# Patient Record
Sex: Male | Born: 1945 | Race: White | Hispanic: No | Smoking: Former smoker
Health system: Southern US, Community
[De-identification: ages and names within clinical notes are randomized; demographics above are authoritative.]

## PROBLEM LIST (undated history)

## (undated) DIAGNOSIS — I10 Essential (primary) hypertension: Secondary | ICD-10-CM

## (undated) DIAGNOSIS — C801 Malignant (primary) neoplasm, unspecified: Secondary | ICD-10-CM

## (undated) DIAGNOSIS — I219 Acute myocardial infarction, unspecified: Secondary | ICD-10-CM

## (undated) DIAGNOSIS — I2510 Atherosclerotic heart disease of native coronary artery without angina pectoris: Secondary | ICD-10-CM

## (undated) DIAGNOSIS — E119 Type 2 diabetes mellitus without complications: Secondary | ICD-10-CM

## (undated) DIAGNOSIS — Z87442 Personal history of urinary calculi: Secondary | ICD-10-CM

## (undated) DIAGNOSIS — M109 Gout, unspecified: Secondary | ICD-10-CM

## (undated) DIAGNOSIS — E785 Hyperlipidemia, unspecified: Secondary | ICD-10-CM

## (undated) HISTORY — DX: Hyperlipidemia, unspecified: E78.5

## (undated) HISTORY — PX: BACK SURGERY: SHX140

## (undated) HISTORY — DX: Essential (primary) hypertension: I10

## (undated) HISTORY — DX: Type 2 diabetes mellitus without complications: E11.9

## (undated) HISTORY — DX: Gout, unspecified: M10.9

## (undated) HISTORY — DX: Atherosclerotic heart disease of native coronary artery without angina pectoris: I25.10

## (undated) HISTORY — PX: OTHER SURGICAL HISTORY: SHX169

## (undated) NOTE — *Deleted (*Deleted)
PMR Admission Coordinator Pre-Admission Assessment   Patient: [name] [name]. is an 85 y.o., male MRN: [id number] DOB: 1946-11-09 Height: 5\' 9"  (175.3 cm) Weight: 99.2 kg   Insurance Information HMO:     PPO:      PCP:      IPA:      80/20:      OTHER:  PRIMARY: A[employer] Medicare      Policy#: [id number]      Subscriber: pt CM Name: Herbert Seta      Phone#: ***     Fax#: [phone] Pre-Cert#: ***      Employer:  Benefits:  Phone #: [phone]     Name:  Dolores Hoose. Date: 11/12/19     Deduct: $0      Out of Pocket Max: $5000  [phone] met)      Life Max: n/a CIR: $295/day for days 1-6      SNF: 20 full days Outpatient:      Co-Pay: $35/visit Home Health: 100%      Co-Pay:  DME: 80%     Co-Pay: 20% Providers:  SECONDARY:       Policy#:      Phone#:    Artist:       Phone#:    The Engineer, materials Information Summary" for patients in Inpatient Rehabilitation Facilities with attached "Privacy Act Statement-Health Care Records" was provided and verbally reviewed with: Patient and Family   Emergency Contact Information         Contact Information     Name Relation Home Work Mobile    [place] Spouse [phone]        [facility] Daughter     [phone]    Sandria Bales     [phone]         Current Medical History  Patient Admitting Diagnosis: cervical myelopathy   History of Present Illness: Pt is a 89 y/o male with PMH of CAD, DM, gout, HTN, and MI who presents to [facility] at the request of his neurosurgeon on 11/5 with c/o worsening weakness and falls following a ? CVA in August 2021 (imaging did not confirm).  ED exam showed numbness to L face, LUE, LLE and weakness in LUE/LLE compared to R.  Per Dr. Maurice Small, pt had been having syncopal episodes in the past, and felt weakness was worsening over the last few weeks.  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  Requested MRI brain/Cspine, CTA with head turn left.  Also consulted Neurology.  MRI brain was negative.  MRI cspine showed multilevel spinal and foraminal stenosis due to spurring as well as a large osteophyte on the L at C5-6 with several spinal encroachment at the same level.  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  Pt underwent C3-C6 laminectomy and bilateral forminotomies per Dr. Maurice Small on 11/7.  Post op course pain management, management of gout flare-up, and DM management.  Therapy evaluations were completed and pt was recommended for CIR.    Patient's medical record from [facility] has been reviewed by the rehabilitation admission coordinator and physician.   Past Medical History      Past Medical History:  Diagnosis Date  . CAD (coronary artery disease)    . Cancer (HCC)    . Diabetes (HCC)    .  Dyslipidemia    . Gout    . History of kidney stones    . HTN (hypertension)    . Myocardial infarction [facility])        Family History   family history includes Colon cancer (age of onset: 68) in his brother; Heart attack (age of onset: 56) in his father.   Prior Rehab/Hospitalizations Has the patient had prior rehab or hospitalizations prior to admission? Yes   Has the patient had major surgery during 100 days prior to admission? Yes              Current Medications   Current Facility-Administered Medications:  .  0.9 %  sodium chloride infusion, , Intravenous, Continuous, Ostergard, Clovis Pu, MD, Last Rate: 75 mL/hr at 09/17/20 0001, New Bag at 09/17/20 0001 .  acetaminophen (TYLENOL) tablet 650 mg, 650 mg, Oral, Q4H PRN, 650 mg at 09/17/20 1408 **OR** acetaminophen (TYLENOL) suppository 650 mg, 650 mg, Rectal, Q4H PRN, Jadene Pierini, MD .  atorvastatin (LIPITOR) tablet 20 mg, 20 mg, Oral, QHS, Ostergard, Clovis Pu, MD, 20 mg at 09/19/20 2203 .  colchicine  tablet 0.6 mg, 0.6 mg, Oral, BID PRN, Lisbeth Renshaw, MD, 0.6 mg at 09/19/20 2203 .  colchicine tablet 0.6 mg, 0.6 mg, Oral, BID, Lisbeth Renshaw, MD, 0.6 mg at 09/20/20 1226 .  cyclobenzaprine (FLEXERIL) tablet 10 mg, 10 mg, Oral, TID PRN, Jadene Pierini, MD, 10 mg at 09/20/20 1226 .  docusate sodium (COLACE) capsule 100 mg, 100 mg, Oral, BID, Jadene Pierini, MD, 100 mg at 09/20/20 0856 .  gabapentin (NEURONTIN) capsule 300 mg, 300 mg, Oral, TID, Jadene Pierini, MD, 300 mg at 09/20/20 0856 .  HYDROmorphone (DILAUDID) injection 1 mg, 1 mg, Intravenous, Q3H PRN, Jadene Pierini, MD, 1 mg at 09/18/20 0730 .  insulin aspart (novoLOG) injection 0-15 Units, 0-15 Units, Subcutaneous, TID WC, Ostergard, Clovis Pu, MD, 5 Units at 09/20/20 1226 .  insulin aspart (novoLOG) injection 0-5 Units, 0-5 Units, Subcutaneous, QHS, Ostergard, Thomas A, MD .  menthol-cetylpyridinium (CEPACOL) lozenge 3 mg, 1 lozenge, Oral, PRN **OR** phenol (CHLORASEPTIC) mouth spray 1 spray, 1 spray, Mouth/Throat, PRN, Ostergard, Thomas A, MD .  metoprolol succinate (TOPROL-XL) 24 hr tablet 50 mg, 50 mg, Oral, Daily, Jadene Pierini, MD, 50 mg at 09/20/20 0855 .  ondansetron (ZOFRAN) tablet 4 mg, 4 mg, Oral, Q6H PRN **OR** ondansetron (ZOFRAN) injection 4 mg, 4 mg, Intravenous, Q6H PRN, Ostergard, Thomas A, MD .  oxyCODONE (Oxy IR/ROXICODONE) immediate release tablet 10 mg, 10 mg, Oral, Q4H PRN, Jadene Pierini, MD, 10 mg at 09/20/20 1226 .  oxyCODONE (Oxy IR/ROXICODONE) immediate release tablet 5 mg, 5 mg, Oral, Q4H PRN, Ostergard, Thomas A, MD .  pantoprazole (PROTONIX) EC tablet 40 mg, 40 mg, Oral, Daily, Jadene Pierini, MD, 40 mg at 09/20/20 0856 .  polyethylene glycol (MIRALAX / GLYCOLAX) packet 17 g, 17 g, Oral, Daily PRN, Jadene Pierini, MD .  tamsulosin (FLOMAX) capsule 0.4 mg, 0.4 mg, Oral, QHS, Ostergard, Thomas A, MD, 0.4 mg at 09/19/20 2203   Patients Current Diet:     Diet  Order                      Diet Carb Modified Fluid consistency: Thin; Room service appropriate? Yes  Diet effective now                      Precautions / Restrictions Precautions  Precautions: Fall, Cervical Precaution Comments: very painful neck with all movement; has vertebral artery stenosis Restrictions Weight Bearing Restrictions: No    Has the patient had 2 or more falls or a fall with injury in the past year? Yes   Prior Activity Level Limited Community (1-2x/wk): had been going out less since Covid started in 2020.  Not mobilizing as much since then.  Using DME for mobility due to pain, using cane then RW   Prior Functional Level Self Care: Did the patient need help bathing, dressing, using the toilet or eating? Independent   Indoor Mobility: Did the patient need assistance with walking from room to room (with or without device)? Independent   Stairs: Did the patient need assistance with internal or external stairs (with or without device)? Independent   Functional Cognition: Did the patient need help planning regular tasks such as shopping or remembering to take medications? Independent   Home Assistive Devices / Equipment Home Assistive Devices/Equipment: Cane (specify quad or straight) Home Equipment: Cane - single point, Walker - 2 wheels, Hand held shower head   Prior Device Use: Indicate devices/aids used by the patient prior to current illness, exacerbation or injury? Walker and cane   Current Functional Level Cognition   Overall Cognitive Status: Within Functional Limits for tasks assessed Orientation Level: Oriented X4    Extremity Assessment (includes Sensation/Coordination)   Upper Extremity Assessment: Defer to OT evaluation LUE Deficits / Details: ROM limited to 90 degrees in L shoulder due to pain an old rotator cuff injury.  Strength:  shoulder 2/5, biceps 4/5, triceps 3+/5, wrist extension 3+/5, grip 3/5, finger extension 2+/5. LUE: Unable to  fully assess due to pain LUE Sensation: decreased light touch LUE Coordination: decreased fine motor, decreased gross motor  Lower Extremity Assessment: Overall WFL for tasks assessed (pt denies difference in sensation bilaterall)     ADLs   Overall ADL's : Needs assistance/impaired Eating/Feeding: Set up, Sitting, Bed level Eating/Feeding Details (indicate cue type and reason): too much pain in neck to sit up too long without support on the neck.   Grooming: Wash/dry hands, Wash/dry face, Oral care, Minimal assistance, Sitting Grooming Details (indicate cue type and reason): occasional min assist due to LUE coordination deficits. Upper Body Bathing: Moderate assistance, Sitting Lower Body Bathing: Maximal assistance, Sit to/from stand, Cueing for compensatory techniques Lower Body Bathing Details (indicate cue type and reason): Pt unable to tolerate full bathing adl at this time due to pain but can do small sections with rest breaks. Upper Body Dressing : Moderate assistance, Sitting, Cueing for compensatory techniques Lower Body Dressing: Moderate assistance, Sit to/from stand, Cueing for compensatory techniques Toilet Transfer: Minimal assistance, +2 for safety/equipment, Stand-pivot, BSC Toilet Transfer Details (indicate cue type and reason): pt can pivot to [facility].  Declined walking to bathroom due to pain.  Pt with L lean when up. Toileting- Clothing Manipulation and Hygiene: Minimal assistance, Sit to/from stand, Cueing for compensatory techniques Functional mobility during ADLs: Minimal assistance, Rolling walker, Cueing for safety General ADL Comments: Pt limited with all adls due to pain and L side weakness.  Pt leans to left and can correct with cues but often times does not correct on own.      Mobility   Overal bed mobility: Needs Assistance Bed Mobility: Supine to Sit, Sit to Supine Supine to sit: Min assist, HOB elevated Sit to supine: Min assist General bed mobility comments:  In chair     Transfers   Overall transfer  level: Needs assistance Equipment used: Rolling walker (2 wheeled) Transfers: Sit to/from Stand Sit to Stand: Min assist, +2 physical assistance General transfer comment: Performed sit to stand x 3; cues for safe hand placement; increased time     Ambulation / Gait / Stairs / Wheelchair Mobility   Ambulation/Gait Ambulation/Gait assistance: Min assist, +2 physical assistance Gait Distance (Feet): 3 Feet Assistive device: Rolling walker (2 wheeled) Gait Pattern/deviations: Step-to pattern, Decreased stride length General Gait Details: Pt declined today due to gout pain in L foot and unable to compensate with UE cue to L shoulder pain.  Reports he has started his gout medicine and feels pain will be improved to morrow for ambulation.  States "I always get a gout flair after surgery." Gait velocity: decreased Gait velocity interpretation: <1.31 ft/sec, indicative of household ambulator     Posture / Balance Dynamic Sitting Balance Sitting balance - Comments: Pt with L lean but could correct with cues. Balance Overall balance assessment: Needs assistance, History of Falls Sitting-balance support: Feet supported, No upper extremity supported Sitting balance-Leahy Scale: Fair Sitting balance - Comments: Pt with L lean but could correct with cues. Postural control: Left lateral lean Standing balance support: During functional activity, Bilateral upper extremity supported Standing balance-Leahy Scale: Poor Standing balance comment: Used RW and min guard of 2 for static stance.  Had pt weight shift side to side and when coming to the left pt occaionally required mod A.     Special needs/care consideration Skin surgical incision to posterior neck and Diabetic management yes    Previous Home Environment (from acute therapy documentation) Living Arrangements: Spouse/significant other Available Help at Discharge: Family, Available 24 hours/day Type of  Home: House Home Layout: One level Home Access: Level entry Bathroom Shower/Tub: Health visitor: Standard Home Care Services: No Additional Comments: does want to use w/c   Discharge Living Setting Plans for Discharge Living Setting: Patient's home Type of Home at Discharge: House Discharge Home Layout: One level Discharge Home Access: Level entry Discharge Bathroom Shower/Tub: Walk-in shower Discharge Bathroom Toilet: Standard Discharge Bathroom Accessibility: Yes How Accessible: Accessible via walker Does the patient have any problems obtaining your medications?: No   Social/Family/Support Systems Patient Roles: Spouse Contact Information: Aodhan Scheidt [phone] Anticipated Caregiver: Iantha Fallen (son), Delaney Meigs (dtr) Anticipated Caregiver's Contact Information: Iantha Fallen [phone]; Delaney Meigs [phone] Ability/Limitations of Caregiver: spouse can only provide supervision, Iantha Fallen and Delaney Meigs can provide up to min assist Caregiver Availability: 24/7 Discharge Plan Discussed with Primary Caregiver: Yes Is Caregiver In Agreement with Plan?: Yes Does Caregiver/Family have Issues with Lodging/Transportation while Pt is in Rehab?: No   Goals Patient/Family Goal for Rehab: PT/OT supervisio to mod I, no SLP Expected length of stay: 10-12 days Additional Information: gout flare on 11/8 Pt/Family Agrees to Admission and willing to participate: Yes Program Orientation Provided & Reviewed with Pt/Caregiver Including Roles  & Responsibilities: Yes  Barriers to Discharge: Insurance for SNF coverage   Decrease burden of Care through IP rehab admission: n/a   Possible need for SNF placement upon discharge: Not anticipated   Patient Condition: I have reviewed medical records from [facility], spoken with CM, and patient and son. I met with patient at the bedside for inpatient rehabilitation assessment.  Patient will benefit from ongoing PT and OT, can actively participate  in 3 hours of therapy a day 5 days of the week, and can make measurable gains during the admission.  Patient will also benefit from the coordinated team approach during  an Inpatient Acute Rehabilitation admission.  The patient will receive intensive therapy as well as Rehabilitation physician, nursing, social worker, and care management interventions.  Due to safety, skin/wound care, disease management, medication administration, pain management and patient education the patient requires 24 hour a day rehabilitation nursing.  The patient is currently mod assist with mobility and basic ADLs.  Discharge setting and therapy post discharge at home with home health is anticipated.  Patient has agreed to participate in the Acute Inpatient Rehabilitation Program and will admit pending insurance authorization ***.   Preadmission Screen Completed By:  Stephania Fragmin, PT, DPT 09/20/2020 1:23 PM

---

## 2004-10-08 ENCOUNTER — Ambulatory Visit: Payer: Self-pay | Admitting: Family Medicine

## 2005-04-16 ENCOUNTER — Ambulatory Visit: Payer: Self-pay | Admitting: Family Medicine

## 2005-10-04 ENCOUNTER — Ambulatory Visit: Payer: Self-pay | Admitting: Family Medicine

## 2005-12-16 ENCOUNTER — Ambulatory Visit: Payer: Self-pay | Admitting: Family Medicine

## 2006-08-21 ENCOUNTER — Ambulatory Visit: Payer: Self-pay | Admitting: Family Medicine

## 2006-12-18 ENCOUNTER — Ambulatory Visit: Payer: Self-pay | Admitting: Family Medicine

## 2007-03-20 ENCOUNTER — Ambulatory Visit: Payer: Self-pay | Admitting: Family Medicine

## 2009-12-11 ENCOUNTER — Emergency Department (HOSPITAL_COMMUNITY): Admission: EM | Admit: 2009-12-11 | Discharge: 2009-12-11 | Payer: Self-pay | Admitting: Emergency Medicine

## 2011-01-12 IMAGING — CR DG CHEST 1V PORT
1 series · 1 of 1 positions shown · non-contrast
Comparison: None.

CLINICAL DATA: Dizziness.  Vomiting.  Hypertension.

CHEST - 1 VIEW

[view not recorded]
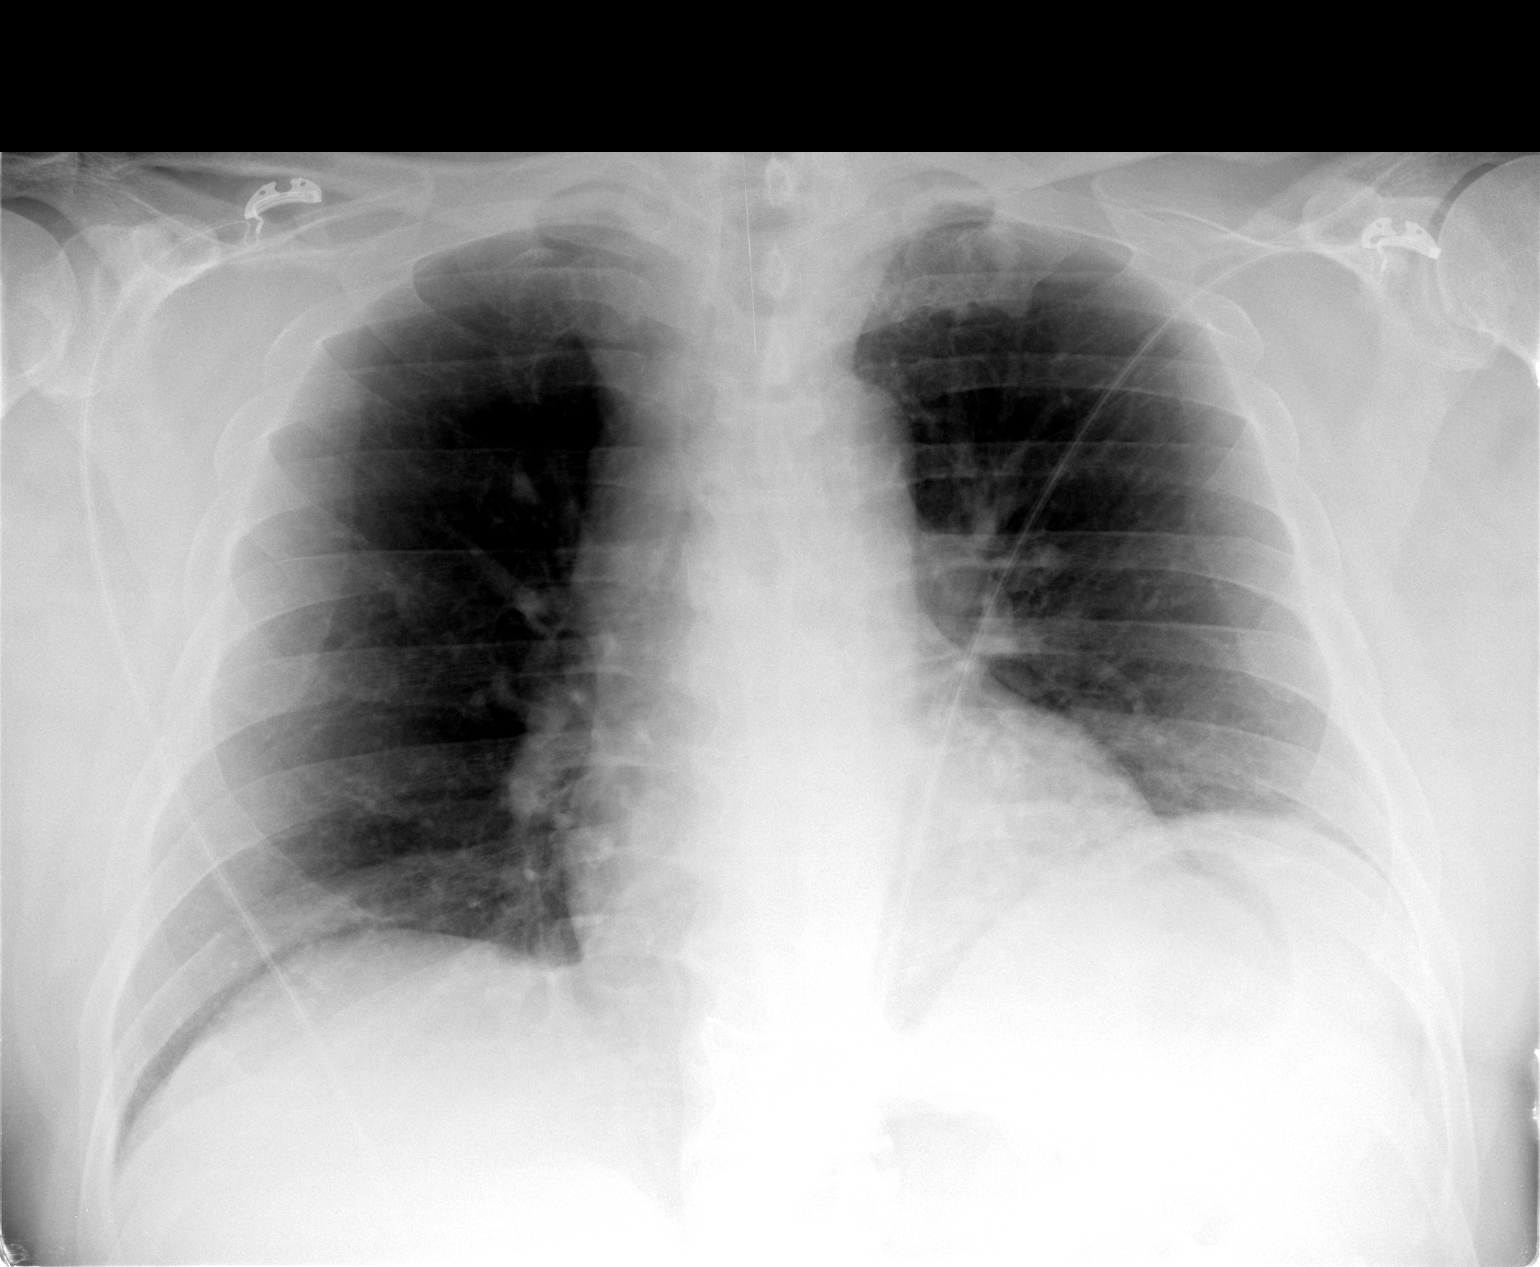

[1 of 1 positions shown; findings below may reference images not displayed]

FINDINGS: The heart size and mediastinal contours are within normal
limits.  Both lungs are clear.  Bony thorax grossly intact.
IMPRESSION: No active disease.

## 2011-01-12 IMAGING — CT CT HEAD W/O CM
1 series · 16 of 30 positions shown, 20 images · non-contrast
Comparison: None

CLINICAL DATA: Dizziness

CT HEAD WITHOUT CONTRAST
TECHNIQUE: Contiguous axial images were obtained from the base of
the skull through the vertex without contrast.

[Series 2: headseq 4.8 h37s · axial · 0.43mm/px · z∈[+109,+266]mm · 16 of 36 slices shown, 20 images]
[im 2/36  brain]
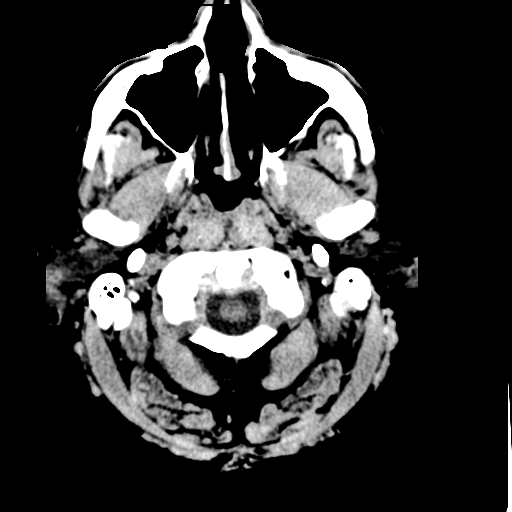
[im 2/36  bone]
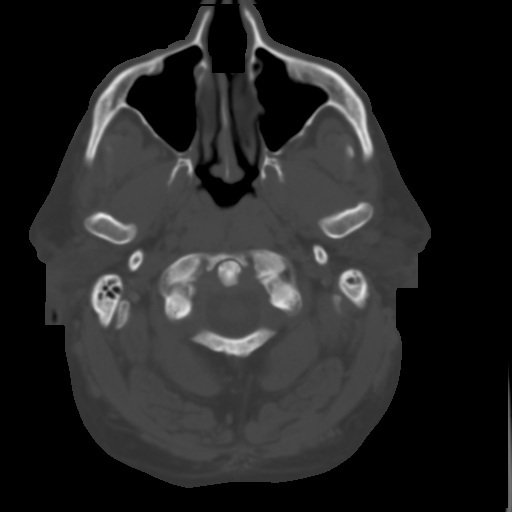
[im 4/36  brain]
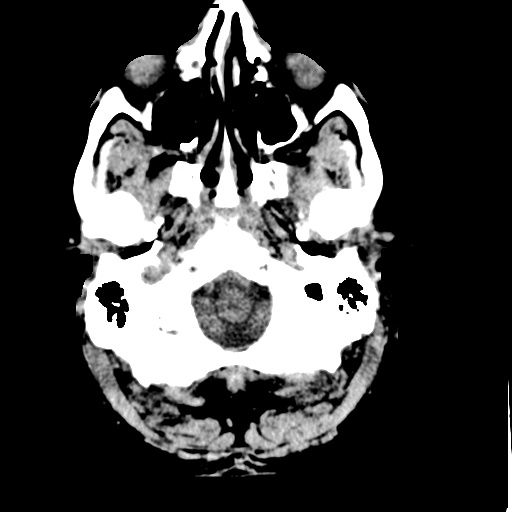
[im 7/36  brain]
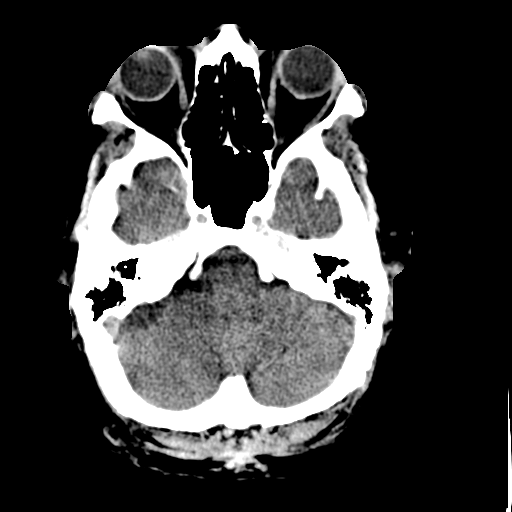
[im 9/36  brain]
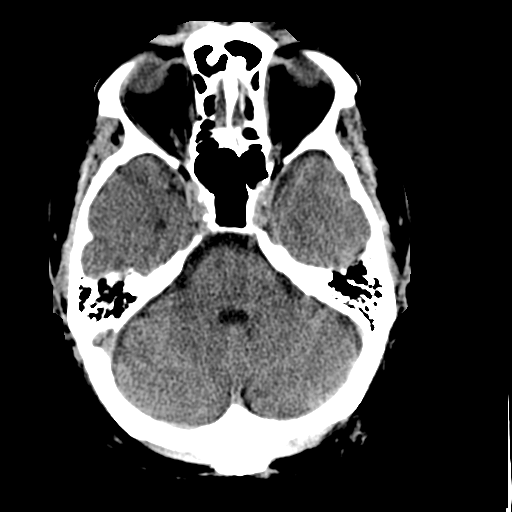
[im 10/36  brain]
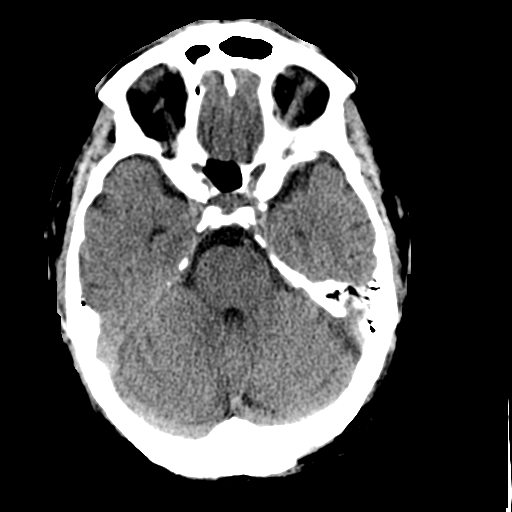
[im 10/36  bone]
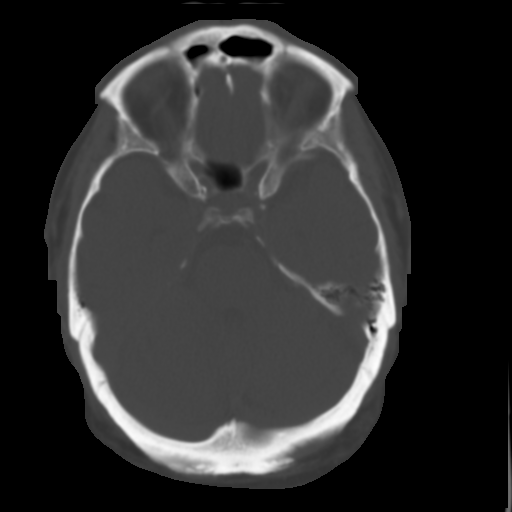
[im 13/36  brain]
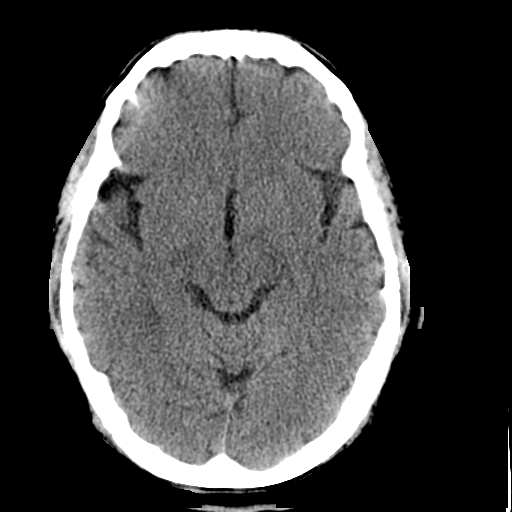
[im 15/36  brain]
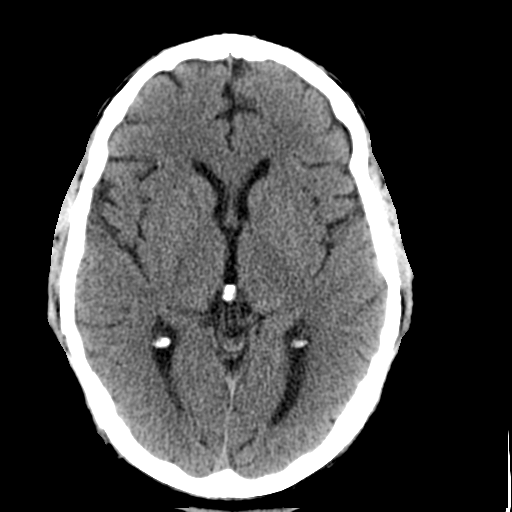
[im 17/36  brain]
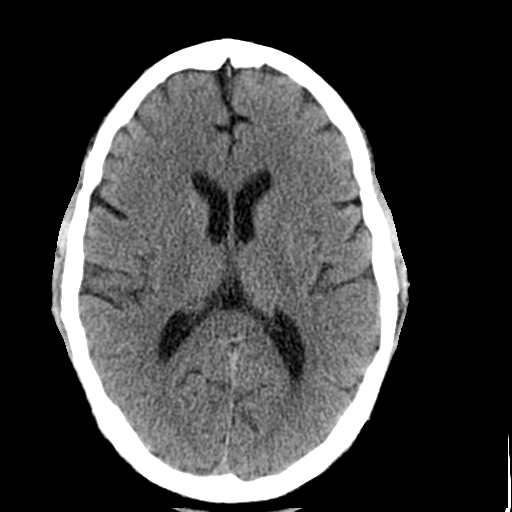
[im 19/36  brain]
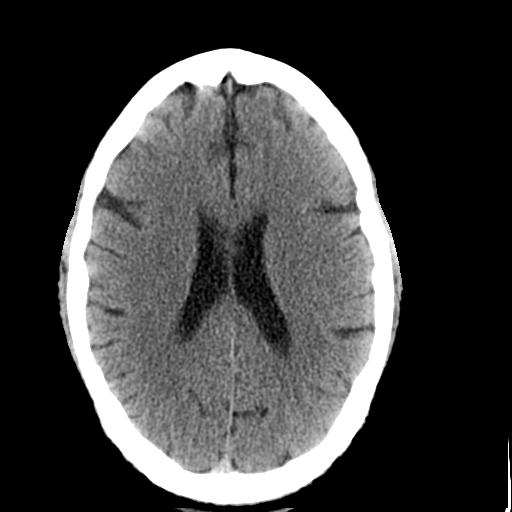
[im 19/36  bone]
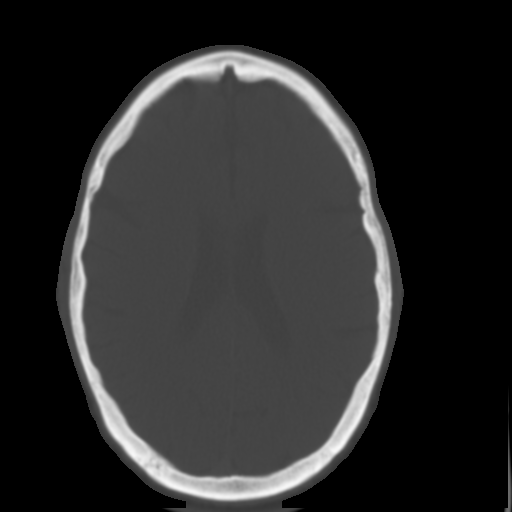
[im 21/36  brain]
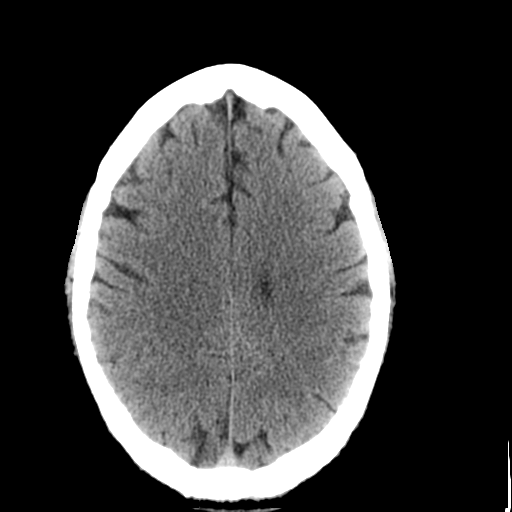
[im 23/36  brain]
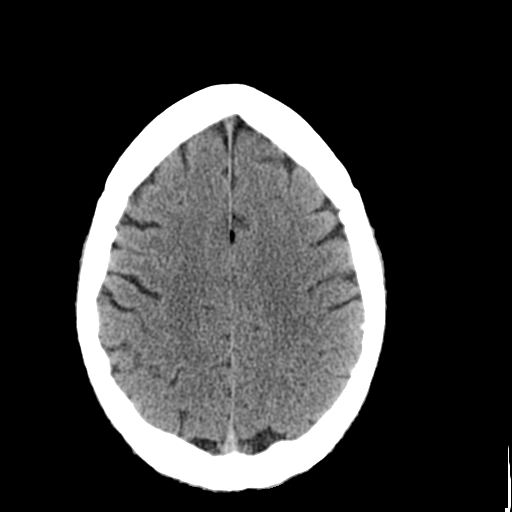
[im 26/36  brain]
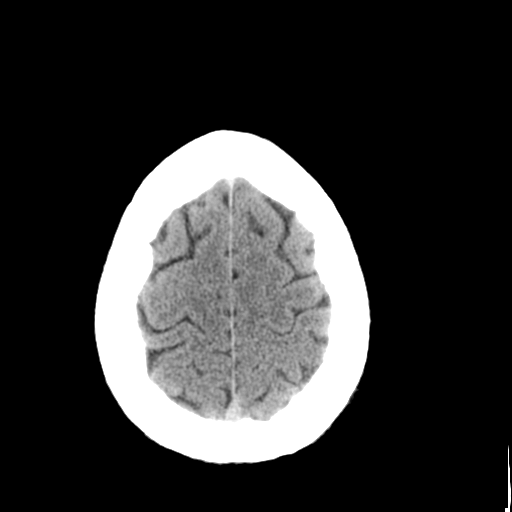
[im 27/36  brain]
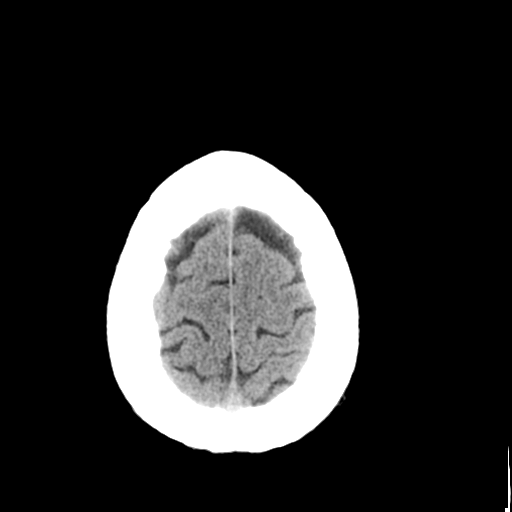
[im 27/36  bone]
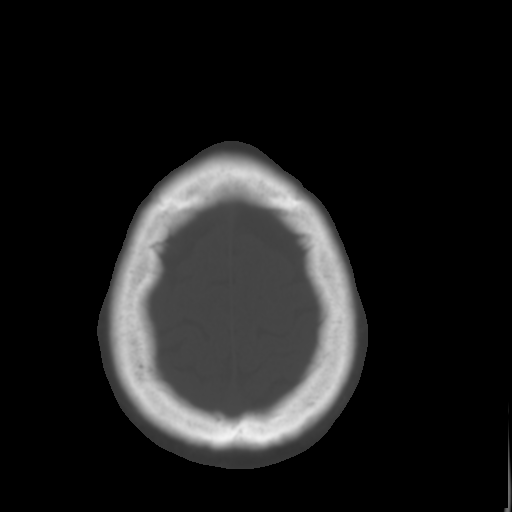
[im 29/36  brain]
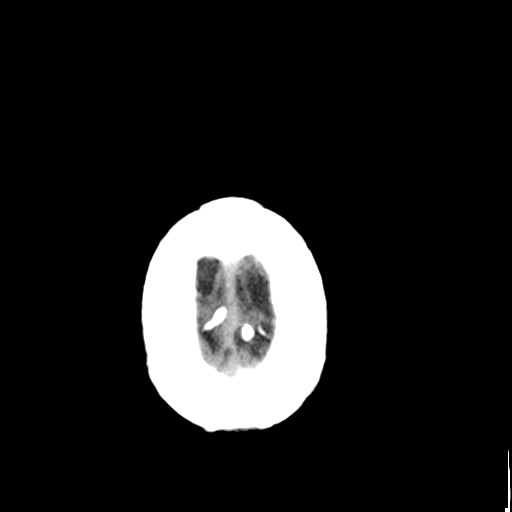
[im 32/36  brain]
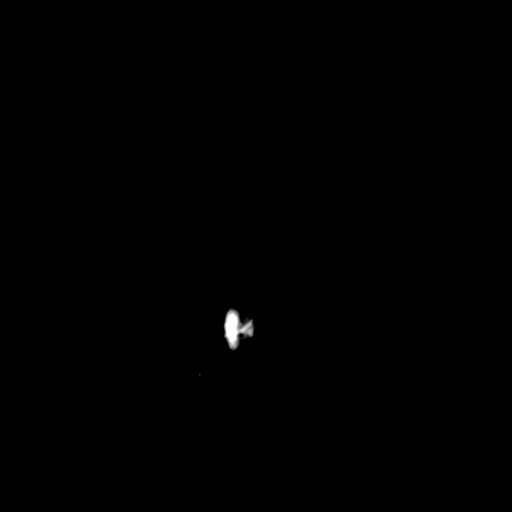
[im 34/36  brain]
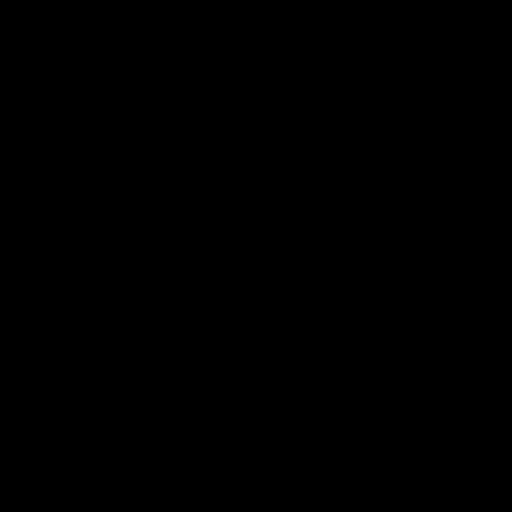

[16 of 30 positions shown; findings below may reference images not displayed]

FINDINGS: The brain has a normal appearance without evidence for
hemorrhage, infarction, hydrocephalus, or mass lesion.  There is no
extra axial fluid collection.  The skull and paranasal sinuses are
normal.
IMPRESSION: No acute intracranial abnormalities.

## 2011-01-28 LAB — CBC
HCT: 43 % (ref 39.0–52.0)
Hemoglobin: 14.6 g/dL (ref 13.0–17.0)
MCHC: 34 g/dL (ref 30.0–36.0)
MCV: 85.6 fL (ref 78.0–100.0)
Platelets: 243 10*3/uL (ref 150–400)
RBC: 5.03 MIL/uL (ref 4.22–5.81)
RDW: 13.8 % (ref 11.5–15.5)
WBC: 14.6 10*3/uL — ABNORMAL HIGH (ref 4.0–10.5)

## 2011-01-28 LAB — POCT CARDIAC MARKERS
CKMB, poc: 2.3 ng/mL (ref 1.0–8.0)
CKMB, poc: 2.7 ng/mL (ref 1.0–8.0)
Myoglobin, poc: 103 ng/mL (ref 12–200)
Myoglobin, poc: 117 ng/mL (ref 12–200)
Troponin i, poc: <0.05 ng/mL (ref 0.00–0.09)
Troponin i, poc: <0.05 ng/mL (ref 0.00–0.09)

## 2011-01-28 LAB — COMPREHENSIVE METABOLIC PANEL
ALT: 20 U/L (ref 0–53)
AST: 26 U/L (ref 0–37)
Albumin: 4.3 g/dL (ref 3.5–5.2)
Alkaline Phosphatase: 49 U/L (ref 39–117)
BUN: 16 mg/dL (ref 6–23)
CO2: 26 mEq/L (ref 19–32)
Calcium: 9.3 mg/dL (ref 8.4–10.5)
Chloride: 99 mEq/L (ref 96–112)
Creatinine, Ser: 0.82 mg/dL (ref 0.4–1.5)
GFR calc Af Amer: >60 mL/min (ref >60)
GFR calc non Af Amer: >60 mL/min (ref >60)
Glucose, Bld: 173 mg/dL — ABNORMAL HIGH (ref 70–99)
Potassium: 4.2 mEq/L (ref 3.5–5.1)
Sodium: 136 mEq/L (ref 135–145)
Total Bilirubin: 0.8 mg/dL (ref 0.3–1.2)
Total Protein: 7.8 g/dL (ref 6.0–8.3)

## 2011-01-28 LAB — DIFFERENTIAL
Basophils Absolute: 0 10*3/uL (ref 0.0–0.1)
Basophils Relative: 0 % (ref 0–1)
Eosinophils Absolute: 0 10*3/uL (ref 0.0–0.7)
Eosinophils Relative: 0 % (ref 0–5)
Lymphocytes Relative: 8 % — ABNORMAL LOW (ref 12–46)
Lymphs Abs: 1.2 10*3/uL (ref 0.7–4.0)
Monocytes Absolute: 0.4 10*3/uL (ref 0.1–1.0)
Monocytes Relative: 3 % (ref 3–12)
Neutro Abs: 12.9 10*3/uL — ABNORMAL HIGH (ref 1.7–7.7)
Neutrophils Relative %: 89 % — ABNORMAL HIGH (ref 43–77)

## 2013-05-26 ENCOUNTER — Encounter: Payer: Self-pay | Admitting: Cardiology

## 2013-07-07 ENCOUNTER — Ambulatory Visit: Payer: Self-pay | Admitting: Cardiovascular Disease

## 2013-07-09 ENCOUNTER — Ambulatory Visit (INDEPENDENT_AMBULATORY_CARE_PROVIDER_SITE_OTHER): Payer: Medicare Other | Admitting: Cardiovascular Disease

## 2013-07-09 ENCOUNTER — Encounter: Payer: Self-pay | Admitting: *Deleted

## 2013-07-09 VITALS — BP 132/79 | HR 89 | Ht 69.5 in | Wt 253.0 lb

## 2013-07-09 DIAGNOSIS — R079 Chest pain, unspecified: Secondary | ICD-10-CM

## 2013-07-09 DIAGNOSIS — I2510 Atherosclerotic heart disease of native coronary artery without angina pectoris: Secondary | ICD-10-CM | POA: Insufficient documentation

## 2013-07-09 DIAGNOSIS — I10 Essential (primary) hypertension: Secondary | ICD-10-CM

## 2013-07-09 DIAGNOSIS — E785 Hyperlipidemia, unspecified: Secondary | ICD-10-CM

## 2013-07-09 DIAGNOSIS — Z9861 Coronary angioplasty status: Secondary | ICD-10-CM

## 2013-07-09 MED ORDER — ROSUVASTATIN CALCIUM 5 MG PO TABS: 5.0000 mg | ORAL_TABLET | Freq: Every day | ORAL | Status: DC

## 2013-07-09 MED ORDER — NITROGLYCERIN 0.4 MG SL SUBL: 0.4000 mg | SUBLINGUAL_TABLET | SUBLINGUAL | Status: DC | PRN

## 2013-07-09 MED ORDER — METOPROLOL SUCCINATE ER 50 MG PO TB24: ORAL_TABLET | ORAL | Status: DC

## 2013-07-09 NOTE — Progress Notes (Signed)
Patient ID: [name] Paparella., male   DOB: 22-Oct-1946, 67 y.o.   MRN: [id number]    CARDIOLOGY CONSULT NOTE  Patient ID: [name] Guard. MRN: [id number] DOB/AGE: 1946-07-31 67 y.o.    HPI: [name] [name] has a h/o diabetes, HTN, and reportedly had an MI in 1973, and subsequently had angioplasty x 2 in 1993 in [place]. He was put on statins in the past but apparently did not tolerate them (he's uncertain as to when specifically this was, and does not know which medication). He is currently on ASA, ACEI, and a beta blocker. An ECG was done on 05/05/13 which revealed NSR with an RBBB.  He's been having chest pain located substernally without radiation. He denies associated shortness of breath, lightheadedness, and palpitations.  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 He's traveled to all 50 states and all [place]. He quit smoking 18 years ago but used to smoke for 40+ years (5 ppd). He now uses snuff.   Family History  Problem Relation Age of Onset  . Heart attack Father 33  . Colon cancer Brother 79    History   Social History  . Marital Status: Married    Spouse Name: N/A    Number of Children: N/A  . Years of Education: N/A   Occupational History  . Not on file.   Social History Main Topics  . Smoking status: Former Games developer  . Smokeless tobacco: Not on file  . Alcohol Use: No  . Drug Use: No  . Sexual Activity: Not on file   Other Topics Concern  . Not on file   Social History Narrative  . No narrative on file      Physical exam Blood pressure 132/79, pulse 89, height 5' 9.5" (1.765 m), weight 253 lb (114.76 kg). General: NAD Neck: No JVD, no thyromegaly or thyroid nodule.  Lungs: Clear to auscultation bilaterally with normal respiratory effort. CV: Nondisplaced PMI.  Heart regular S1/S2, no  S3/S4, no murmur.  No peripheral edema.  No carotid bruit.  Normal pedal pulses.  Abdomen: Soft, nontender, no hepatosplenomegaly, no distention.  Skin: Intact without lesions or rashes.  Neurologic: Alert and oriented x 3.  Psych: Normal affect. Extremities: No clubbing or cyanosis.  HEENT: Normal.   Labs:   Lab Results  Component Value Date   WBC 14.6* 12/11/2009   HGB 14.6 12/11/2009   HCT 43.0 12/11/2009   MCV 85.6 12/11/2009   PLT 243 12/11/2009   No results found for this basename: NA, K, CL, CO2, BUN, CREATININE, CALCIUM, LABALBU, PROT, BILITOT, ALKPHOS, ALT, AST, GLUCOSE,  in the last 168 hours No results found for this basename: CKTOTAL, CKMB, CKMBINDEX, TROPONINI    No results found for this basename: CHOL   No results found for this basename: HDL   No results found for this basename: LDLCALC   No results found for this basename: TRIG   No results found for this basename: CHOLHDL   No results found for this basename: LDLDIRECT           ASSESSMENT AND PLAN:  1. Chest pain in the setting of known CAD and prior angioplasty: his symptoms are concerning for angina. To decrease myocardial demand, I will increase his Toprol XL to 75 mg daily. I will also add Crestor 5  mg daily and see if he tolerates this. I will obtain an echocardiogram to evaluate his systolic function, as well as for wall motion abnormalities. I will also obtain a Lexiscan Cardiolite stress test, possibly a 2-day test. I will prescribe SL Nitro to be used on a prn basis.  2. HTN: controlled on current therapy. No med adjustments at this time.  3. Dyslipidemia: will start Crestor 5 mg daily, and recheck lipids in 3 months along with LFT's.  Signed: Prentice Docker, M.D., F.A.C.C. 07/09/2013, 2:34 PM

## 2013-07-09 NOTE — Patient Instructions (Addendum)
Your physician recommends that you schedule a follow-up appointment in: 7-10 days.   Your physician has recommended you make the following change in your medication: All medications will remain the same except the following: Increase your metoprolol succinate to 75 mg daily.  Start crestor 5 mg daily Start nitroglycerin 0.4 mg for severe chest pain. Place 1 tablet under your tongue every 5 minutes up to 3 doses. If no relief after 3rd dose, proceed to ED for evaluation.   Your physician has requested that you have an echocardiogram. Echocardiography is a painless test that uses sound waves to create images of your heart. It provides your doctor with information about the size and shape of your heart and how well your heart's chambers and valves are working. This procedure takes approximately one hour. There are no restrictions for this procedure.  Your physician has requested that you have a lexiscan myoview. For further information please visit [email or web address] Please follow instruction sheet, as given.   Your physician recommends that you return for a FASTING lipid/liver profile: in 3 months. You will be contacted when this is due.

## 2013-07-19 ENCOUNTER — Ambulatory Visit: Payer: Medicare Other | Admitting: Cardiovascular Disease

## 2013-07-21 ENCOUNTER — Encounter (HOSPITAL_COMMUNITY)
Admission: RE | Admit: 2013-07-21 | Discharge: 2013-07-21 | Disposition: A | Discharge status: Home / Self Care | Payer: Medicare Other | Source: Ambulatory Visit | Attending: Cardiovascular Disease | Admitting: Cardiovascular Disease

## 2013-07-21 ENCOUNTER — Encounter (HOSPITAL_COMMUNITY): Payer: Self-pay

## 2013-07-21 DIAGNOSIS — R079 Chest pain, unspecified: Secondary | ICD-10-CM

## 2013-07-21 DIAGNOSIS — I4510 Unspecified right bundle-branch block: Secondary | ICD-10-CM | POA: Insufficient documentation

## 2013-07-21 DIAGNOSIS — I2510 Atherosclerotic heart disease of native coronary artery without angina pectoris: Secondary | ICD-10-CM | POA: Insufficient documentation

## 2013-07-21 DIAGNOSIS — Z9861 Coronary angioplasty status: Secondary | ICD-10-CM | POA: Insufficient documentation

## 2013-07-21 DIAGNOSIS — I517 Cardiomegaly: Secondary | ICD-10-CM

## 2013-07-21 HISTORY — DX: Malignant (primary) neoplasm, unspecified: C80.1

## 2013-07-21 MED ORDER — TECHNETIUM TC 99M SESTAMIBI - CARDIOLITE
30.0000 | Freq: Once | INTRAVENOUS | Status: AC | PRN
  Administered 2013-07-21: 30 via INTRAVENOUS

## 2013-07-21 MED ORDER — SODIUM CHLORIDE 0.9 % IJ SOLN
INTRAMUSCULAR | Status: AC
  Administered 2013-07-21: 10 mL via INTRAVENOUS
  Filled 2013-07-21: qty 10

## 2013-07-21 MED ORDER — TECHNETIUM TC 99M SESTAMIBI - CARDIOLITE
10.0000 | Freq: Once | INTRAVENOUS | Status: AC | PRN
  Administered 2013-07-21: 09:00:00 10 via INTRAVENOUS

## 2013-07-21 MED ORDER — REGADENOSON 0.4 MG/5ML IV SOLN
INTRAVENOUS | Status: AC
  Administered 2013-07-21: 0.4 mg via INTRAVENOUS
  Filled 2013-07-21: qty 5

## 2013-07-21 NOTE — Progress Notes (Signed)
*  PRELIMINARY RESULTS* Echocardiogram 2D Echocardiogram has been performed.  [name] [name] 07/21/2013, 12:55 PM

## 2013-07-21 NOTE — Progress Notes (Signed)
Stress Lab Nurses Notes - [name] [name]  [name] [name]. 07/21/2013 Reason for doing test: CAD and Dyspnea Type of test: Marlane Hatcher Nurse performing test: Parke Poisson, RN Nuclear Medicine Tech: Lyndel Pleasure Echo Tech: Not Applicable MD performing test: Dr. Purvis Sheffield / Jacolyn Reedy PA Family MD: Nyland Test explained and consent signed: yes IV started: 22g jelco, Saline lock flushed, No redness or edema and Saline lock started in radiology Symptoms: Chest tightness  Treatment/Intervention: None Reason test stopped: protocol completed After recovery IV was: Discontinued via X-ray tech and No redness or edema Patient to return to Nuc. Med at : 11:30 Patient discharged: Home Patient's Condition upon discharge was: stable Comments: During test BP 161/73 & HR 100.  Recovery BP 157/76 & HR 88.  Symptoms resolved in recovery. [name]

## 2013-07-22 ENCOUNTER — Other Ambulatory Visit (HOSPITAL_COMMUNITY): Payer: Medicare Other

## 2013-07-22 ENCOUNTER — Ambulatory Visit (INDEPENDENT_AMBULATORY_CARE_PROVIDER_SITE_OTHER): Payer: Medicare Other | Admitting: Cardiovascular Disease

## 2013-07-22 ENCOUNTER — Encounter: Payer: Self-pay | Admitting: Cardiovascular Disease

## 2013-07-22 VITALS — BP 132/74 | HR 83 | Ht 69.5 in | Wt 251.0 lb

## 2013-07-22 DIAGNOSIS — E785 Hyperlipidemia, unspecified: Secondary | ICD-10-CM

## 2013-07-22 DIAGNOSIS — R0602 Shortness of breath: Secondary | ICD-10-CM

## 2013-07-22 DIAGNOSIS — R079 Chest pain, unspecified: Secondary | ICD-10-CM

## 2013-07-22 DIAGNOSIS — I2510 Atherosclerotic heart disease of native coronary artery without angina pectoris: Secondary | ICD-10-CM

## 2013-07-22 DIAGNOSIS — Z9861 Coronary angioplasty status: Secondary | ICD-10-CM

## 2013-07-22 DIAGNOSIS — I10 Essential (primary) hypertension: Secondary | ICD-10-CM

## 2013-07-22 MED ORDER — ISOSORBIDE MONONITRATE ER 30 MG PO TB24: 30.0000 mg | ORAL_TABLET | Freq: Every day | ORAL | Status: DC

## 2013-07-22 NOTE — Patient Instructions (Signed)
   Begin Imdur 30mg  daily - new sent to pharm Continue all other medications.   Patient to call back with update in 2-3 weeks on how he is tolerating medication Follow up in 3 months

## 2013-07-22 NOTE — Progress Notes (Signed)
Patient ID: [name] Richart., male   DOB: [birth year], 67 y.o.   MRN: [id number]   SUBJECTIVE: [name] [name] is here with his daughter to f/u on the results of his cardiac testing. He currently denies chest pain altogether, after I had increased his Toprol-XL to 75 mg daily. He does c/o dyspnea with exertion, particularly when walking to and from the mailbox. Again, he has a long h/o tobacco use for more than 40 years, but now only uses snuff.  He denies palpitations, leg swelling, and syncope. 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  His echocardiogram showed normal LV systolic function with an EF of 60-65%, mild to moderate LVH, and grade I diastolic dysfunction.  While the final results of his stress test are not back yet, I briefly reviewed the images and there did appear to be a fixed inferior wall defect with superimposed diaphragmatic soft tissue attenuation, with normal wall motion. His EF was underestimated by nuclear MPI at 45%, as it appeared to be normal on visual inspection.    No Known Allergies  Current Outpatient Prescriptions  Medication Sig Dispense Refill  . aspirin 81 MG chewable tablet Chew 81 mg by mouth daily.      . B Complex-C (SUPER B COMPLEX PO) Take 1 tablet by mouth daily.      . Chromium-Cinnamon (CINNAMON PLUS CHROMIUM) [phone] MCG-MG CAPS Take 2 capsules by mouth daily.      . fenofibrate micronized (ANTARA) 130 MG capsule Take 130 mg by mouth daily before breakfast.      . Garlic 1000 MG CAPS Take 1,000 mg by mouth daily.      Marland Kitchen glipiZIDE (GLUCOTROL XL) 5 MG 24 hr tablet Take 5 mg by mouth daily.      Marland Kitchen lisinopril (PRINIVIL,ZESTRIL) 5 MG tablet Take 5 mg by mouth daily.      . metFORMIN (GLUCOPHAGE) 500 MG tablet Take 500 mg by mouth 2 (two) times daily with a meal.      . metoprolol succinate (TOPROL-XL) 50 MG 24 hr tablet Take 75 mg by mouth daily. Take with or immediately following a meal.  135 tablet  2  . Multiple Vitamin (MULTIVITAMIN) tablet  Take 1 tablet by mouth daily.      . nitroGLYCERIN (NITROSTAT) 0.4 MG SL tablet Place 1 tablet (0.4 mg total) under the tongue every 5 (five) minutes as needed for chest pain.  25 tablet  3  . NON FORMULARY Take 2 capsules by mouth every morning. Uric Acid Complex      . Omega-3 Fatty Acids (FISH OIL TRIPLE STRENGTH) 1400 MG CAPS Take 1,400 mg by mouth daily.      . rosuvastatin (CRESTOR) 5 MG tablet Take 1 tablet (5 mg total) by mouth daily.  90 tablet  3  . isosorbide mononitrate (IMDUR) 30 MG 24 hr tablet Take 1 tablet (30 mg total) by mouth daily.  30 tablet  6   No current facility-administered medications for this visit.    Past Medical History  Diagnosis Date  . Diabetes   . HTN (hypertension)   . Gout   . Dyslipidemia   . CAD (coronary artery disease)   . Cancer     Past Surgical History  Procedure Laterality Date  . Back surgery    . Left shoulder surgery    . Left knee surgery    . Right knee surgery      History   Social History  . Marital  Status: Married    Spouse Name: N/A    Number of Children: N/A  . Years of Education: N/A   Occupational History  . Not on file.   Social History Main Topics  . Smoking status: Former Games developer  . Smokeless tobacco: Not on file  . Alcohol Use: No  . Drug Use: No  . Sexual Activity: Not on file   Other Topics Concern  . Not on file   Social History Narrative  . No narrative on file     Filed Vitals:   07/22/13 1444  BP: 132/74  Pulse: 83  Height: 5' 9.5" (1.765 m)  Weight: 251 lb (113.853 kg)  SpO2: 96%    PHYSICAL EXAM General: NAD Neck: No JVD, no thyromegaly or thyroid nodule.  Lungs: Clear to auscultation bilaterally with normal respiratory effort. CV: Nondisplaced PMI.  Heart regular S1/S2, no S3/S4, no murmur.  No peripheral edema.  No carotid bruit.  Normal pedal pulses.  Abdomen: Soft, nontender, no hepatosplenomegaly, no distention.  Neurologic: Alert and oriented x 3.  Psych: Normal  affect. Extremities: No clubbing or cyanosis.   ECG: reviewed and available in electronic records.      ASSESSMENT AND PLAN: 1. Chest pain in the setting of known CAD and prior angioplasty: His chest pain has resolved with the increase of his beta blocker, which I did to decrease myocardial demand. I also added Crestor 5 mg daily at his last visit, and he appears to be tolerating it thus far.  2. Dyspnea with exertion: this may be related to undiagnosed COPD and for this reason, I will proceed with PFT's. As it could be an anginal equivalent, I'm giving him a trial of Imdur 30 mg daily, but I've told him to call me in 2-3 weeks if it hasn't alleviated his symptoms, at which point I would simply discontinue it. I also informed him of possible headaches.  3. HTN: controlled on current therapy. No med adjustments at this time.   4. Dyslipidemia: continue Crestor 5 mg daily, and recheck lipids in 3 months along with LFT's.    Prentice Docker, M.D., F.A.C.C.

## 2013-10-11 ENCOUNTER — Ambulatory Visit (INDEPENDENT_AMBULATORY_CARE_PROVIDER_SITE_OTHER): Payer: Medicare Other | Admitting: Cardiovascular Disease

## 2013-10-11 ENCOUNTER — Encounter: Payer: Self-pay | Admitting: Cardiovascular Disease

## 2013-10-11 VITALS — BP 166/96 | HR 94 | Ht 69.0 in | Wt 252.0 lb

## 2013-10-11 DIAGNOSIS — I2510 Atherosclerotic heart disease of native coronary artery without angina pectoris: Secondary | ICD-10-CM

## 2013-10-11 DIAGNOSIS — I10 Essential (primary) hypertension: Secondary | ICD-10-CM

## 2013-10-11 DIAGNOSIS — Z79899 Other long term (current) drug therapy: Secondary | ICD-10-CM

## 2013-10-11 DIAGNOSIS — Z9861 Coronary angioplasty status: Secondary | ICD-10-CM

## 2013-10-11 DIAGNOSIS — E785 Hyperlipidemia, unspecified: Secondary | ICD-10-CM

## 2013-10-11 DIAGNOSIS — R0602 Shortness of breath: Secondary | ICD-10-CM

## 2013-10-11 MED ORDER — LISINOPRIL 10 MG PO TABS: 10.0000 mg | ORAL_TABLET | Freq: Every day | ORAL | Status: DC

## 2013-10-11 NOTE — Patient Instructions (Addendum)
   Increase Lisinopril to 10mg  daily - new sent to pharm Continue all other medications.   Labs for lipids & liver function - Reminder:  Nothing to eat or drink after 12 midnight prior to labs. Office will contact with results via phone or letter.   Your physician wants you to follow up in: 6 months.  You will receive a reminder letter in the mail one-two months in advance.  If you don't receive a letter, please call our office to schedule the follow up appointment

## 2013-10-11 NOTE — Progress Notes (Signed)
Patient ID: [name] Calixte., male   DOB: 1945/11/27, 67 y.o.   MRN: [id number]      SUBJECTIVE: The patient is doing very well and denies any chest pain, shortness of breath, palpitations, lightheadedness and dizziness. He also denies leg swelling.   No Known Allergies  Current Outpatient Prescriptions  Medication Sig Dispense Refill  . aspirin 81 MG chewable tablet Chew 81 mg by mouth daily.      . B Complex-C (SUPER B COMPLEX PO) Take 1 tablet by mouth daily.      . Chromium-Cinnamon (CINNAMON PLUS CHROMIUM) [phone] MCG-MG CAPS Take 2 capsules by mouth daily.      . fenofibrate micronized (ANTARA) 130 MG capsule Take 130 mg by mouth daily before breakfast.      . Garlic 1000 MG CAPS Take 1,000 mg by mouth daily.      Marland Kitchen glipiZIDE (GLUCOTROL XL) 5 MG 24 hr tablet Take 5 mg by mouth daily.      . isosorbide mononitrate (IMDUR) 30 MG 24 hr tablet Take 1 tablet (30 mg total) by mouth daily.  30 tablet  6  . lisinopril (PRINIVIL,ZESTRIL) 5 MG tablet Take 5 mg by mouth daily.      . metFORMIN (GLUCOPHAGE) 500 MG tablet Take 500 mg by mouth 2 (two) times daily with a meal.      . metoprolol succinate (TOPROL-XL) 50 MG 24 hr tablet Take 75 mg by mouth daily. Take with or immediately following a meal.  135 tablet  2  . Multiple Vitamin (MULTIVITAMIN) tablet Take 1 tablet by mouth daily.      . nitroGLYCERIN (NITROSTAT) 0.4 MG SL tablet Place 1 tablet (0.4 mg total) under the tongue every 5 (five) minutes as needed for chest pain.  25 tablet  3  . NON FORMULARY Take 2 capsules by mouth every morning. Uric Acid Complex      . Omega-3 Fatty Acids (FISH OIL TRIPLE STRENGTH) 1400 MG CAPS Take 1,400 mg by mouth daily.      . rosuvastatin (CRESTOR) 5 MG tablet Take 1 tablet (5 mg total) by mouth daily.  90 tablet  3   No current facility-administered medications for this visit.    Past Medical History  Diagnosis Date  . Diabetes   . HTN (hypertension)   . Gout   . Dyslipidemia   . CAD  (coronary artery disease)   . Cancer     Past Surgical History  Procedure Laterality Date  . Back surgery    . Left shoulder surgery    . Left knee surgery    . Right knee surgery      History   Social History  . Marital Status: Married    Spouse Name: N/A    Number of Children: N/A  . Years of Education: N/A   Occupational History  . Not on file.   Social History Main Topics  . Smoking status: Former Games developer  . Smokeless tobacco: Not on file  . Alcohol Use: No  . Drug Use: No  . Sexual Activity: Not on file   Other Topics Concern  . Not on file   Social History Narrative  . No narrative on file     There were no vitals filed for this visit.  PHYSICAL EXAM General: NAD Neck: No JVD, no thyromegaly or thyroid nodule.  Lungs: Clear to auscultation bilaterally with normal respiratory effort. CV: Nondisplaced PMI.  Heart regular S1/S2, no S3/S4, no murmur.  No  peripheral edema.  No carotid bruit.  Normal pedal pulses.  Abdomen: Soft, nontender, no hepatosplenomegaly, no distention.  Neurologic: Alert and oriented x 3.  Psych: Normal affect. Extremities: No clubbing or cyanosis.   ECG: reviewed and available in electronic records.      ASSESSMENT AND PLAN: 1. CAD and prior angioplasty: his prior episodes of chest pain have resolved with the increase of his beta blocker, which I did to decrease myocardial demand. He is also on Crestor and Imdur and appears to be tolerating it thus far.  2. Dyspnea with exertion: resolved with the addition of Imdur. 3. HTN: uncontrolled on current therapy, with most home SBP readings greater than 140 mmHg. Will increase lisinopril to 10 mg daily. 4. Dyslipidemia: continue Crestor 5 mg daily, and recheck lipids in [redacted] weeks along with LFT's.       Prentice Docker, M.D., F.A.C.C.

## 2013-10-22 ENCOUNTER — Ambulatory Visit: Payer: Medicare Other | Admitting: Cardiovascular Disease

## 2014-04-11 ENCOUNTER — Ambulatory Visit: Payer: Medicare Other | Admitting: Cardiovascular Disease

## 2014-04-12 ENCOUNTER — Encounter: Payer: Self-pay | Admitting: Adult Health

## 2014-04-12 ENCOUNTER — Ambulatory Visit (INDEPENDENT_AMBULATORY_CARE_PROVIDER_SITE_OTHER): Payer: Medicare Other | Admitting: Adult Health

## 2014-04-12 VITALS — BP 126/64 | HR 84 | Ht 69.0 in | Wt 246.0 lb

## 2014-04-12 DIAGNOSIS — I2510 Atherosclerotic heart disease of native coronary artery without angina pectoris: Secondary | ICD-10-CM

## 2014-04-12 DIAGNOSIS — I10 Essential (primary) hypertension: Secondary | ICD-10-CM

## 2014-04-12 DIAGNOSIS — Z9861 Coronary angioplasty status: Secondary | ICD-10-CM

## 2014-04-12 NOTE — Progress Notes (Signed)
HPI: [name] is a 68 year old patient of Dr. Pennelope Bracken normally seen in the [place] office, we are following for ongoing assessment and management of CAD status post PCI, hypertension, hyperlipidemia. The patient has occasional dyspnea on exertion. He was last seen by Dr. Pennelope Bracken in December of 2014. At that was time the patient was doing well without any complaints.  Most recent Lexiscan Myoview was completed in September of 2014 demonstrating:  Impression: Lexiscan Sestamibi: electrically non-diagnostic for ischemia due to baseline ST/T changes. Sestamibi scan with the probable subdiaphragmatic attenuation and normal perfusion, no evidence of significant ischemia or scar. LVEF calculated at 45%.   He comes today without complaints. Wants to get off of all off his medications if he can.  No chest pain, some epigastric pain after eating    No Known Allergies  Current Outpatient Prescriptions  Medication Sig Dispense Refill  . amLODipine (NORVASC) 5 MG tablet Take 5 mg by mouth daily.       Marland Kitchen aspirin 81 MG chewable tablet Chew 81 mg by mouth daily.      . B Complex-C (SUPER B COMPLEX PO) Take 1 tablet by mouth daily.      . Chromium-Cinnamon (CINNAMON PLUS CHROMIUM) [phone] MCG-MG CAPS Take 2 capsules by mouth daily.      . fenofibrate micronized (ANTARA) 130 MG capsule Take 130 mg by mouth daily before breakfast.      . Garlic 6834 MG CAPS Take 1,000 mg by mouth daily.      Marland Kitchen glipiZIDE (GLUCOTROL XL) 5 MG 24 hr tablet Take 5 mg by mouth daily.      . isosorbide mononitrate (IMDUR) 30 MG 24 hr tablet Take 1 tablet (30 mg total) by mouth daily.  30 tablet  6  . lisinopril (PRINIVIL,ZESTRIL) 10 MG tablet Take 1 tablet (10 mg total) by mouth daily.  30 tablet  6  . metFORMIN (GLUCOPHAGE) 500 MG tablet Take 500 mg by mouth 2 (two) times daily with a meal.      . metoprolol succinate (TOPROL-XL) 50 MG 24 hr tablet Take 75 mg by mouth daily. Take with or immediately following a meal.   135 tablet  2  . Multiple Vitamin (MULTIVITAMIN) tablet Take 1 tablet by mouth daily.      . nitroGLYCERIN (NITROSTAT) 0.4 MG SL tablet Place 1 tablet (0.4 mg total) under the tongue every 5 (five) minutes as needed for chest pain.  25 tablet  3  . NON FORMULARY Take 2 capsules by mouth every morning. Uric Acid Complex      . Omega-3 Fatty Acids (FISH OIL TRIPLE STRENGTH) 1400 MG CAPS Take 1,400 mg by mouth daily.      . rosuvastatin (CRESTOR) 5 MG tablet Take 1 tablet (5 mg total) by mouth daily.  90 tablet  3   No current facility-administered medications for this visit.    Past Medical History  Diagnosis Date  . Diabetes   . HTN (hypertension)   . Gout   . Dyslipidemia   . CAD (coronary artery disease)   . Cancer     Past Surgical History  Procedure Laterality Date  . Back surgery    . Left shoulder surgery    . Left knee surgery    . Right knee surgery      ROS: Review of systems complete and found to be negative unless listed above  PHYSICAL EXAM BP 126/64  Pulse 84  Ht 5\' 9"  (1.753 m)  Wt  246 lb (111.585 kg)  BMI 36.31 kg/m2 General: Well developed, well nourished, obese, in no acute distress Head: Eyes PERRLA, No xanthomas.   Normal cephalic and atramatic  Lungs: Clear bilaterally to auscultation and percussion. Heart: HRRR S1 S2, with soft /6 systolic murmur..  Pulses are 2+ & equal.            No carotid bruit. No JVD.  No abdominal bruits. No femoral bruits. Abdomen: Bowel sounds are positive, abdomen soft and non-tender without masses or  Hernia's noted. Obese.  Msk:  Back normal, normal gait. Normal strength and tone for age. Extremities: No clubbing, cyanosis or edema.  DP +1 Neuro: Alert and oriented X 3. Psych:  Good affect, responds appropriately \  EKG:  NSR with RBBB, PVC's. Rate of 86 bpm.  ASSESSMENT AND PLAN

## 2014-04-12 NOTE — Patient Instructions (Signed)
Your physician recommends that you schedule a follow-up appointment in: 6 months  Your physician recommends that you continue on your current medications as directed. Please refer to the Current Medication list given to you today.  Thank you for choosing [facility] !

## 2014-04-12 NOTE — Assessment & Plan Note (Signed)
Low cholesterol diet is recommended. Continue statin.

## 2014-04-12 NOTE — Progress Notes (Deleted)
Name: [name] [name].    DOB: 1945/12/03  Age: 68 y.o.  MR#: [id number]       PCP:  Sherrie Mustache, MD      Insurance: Payor: [employer] / Plan: BLUE MEDICARE / Product Type: *No Product type* /   CC:    Chief Complaint  Patient presents with  . Coronary Artery Disease  . Hypertension    VS Filed Vitals:   04/12/14 1532  BP: 126/64  Pulse: 84  Height: 5' 9"  (1.753 m)  Weight: 246 lb (111.585 kg)    Weights Current Weight  04/12/14 246 lb (111.585 kg)  10/11/13 252 lb (114.306 kg)  07/22/13 251 lb (113.853 kg)    Blood Pressure  BP Readings from Last 3 Encounters:  04/12/14 126/64  10/11/13 166/96  07/22/13 132/74     Admit date:  (Not on file) Last encounter with RMR:  Visit date not found   Allergy Review of patient's allergies indicates no known allergies.  Current Outpatient Prescriptions  Medication Sig Dispense Refill  . amLODipine (NORVASC) 5 MG tablet Take 5 mg by mouth daily.       Marland Kitchen aspirin 81 MG chewable tablet Chew 81 mg by mouth daily.      . B Complex-C (SUPER B COMPLEX PO) Take 1 tablet by mouth daily.      . Chromium-Cinnamon (CINNAMON PLUS CHROMIUM) [phone] MCG-MG CAPS Take 2 capsules by mouth daily.      . fenofibrate micronized (ANTARA) 130 MG capsule Take 130 mg by mouth daily before breakfast.      . Garlic 7342 MG CAPS Take 1,000 mg by mouth daily.      Marland Kitchen glipiZIDE (GLUCOTROL XL) 5 MG 24 hr tablet Take 5 mg by mouth daily.      . isosorbide mononitrate (IMDUR) 30 MG 24 hr tablet Take 1 tablet (30 mg total) by mouth daily.  30 tablet  6  . lisinopril (PRINIVIL,ZESTRIL) 10 MG tablet Take 1 tablet (10 mg total) by mouth daily.  30 tablet  6  . metFORMIN (GLUCOPHAGE) 500 MG tablet Take 500 mg by mouth 2 (two) times daily with a meal.      . metoprolol succinate (TOPROL-XL) 50 MG 24 hr tablet Take 75 mg by mouth daily. Take with or immediately following a meal.  135 tablet  2  . Multiple Vitamin (MULTIVITAMIN)  tablet Take 1 tablet by mouth daily.      . nitroGLYCERIN (NITROSTAT) 0.4 MG SL tablet Place 1 tablet (0.4 mg total) under the tongue every 5 (five) minutes as needed for chest pain.  25 tablet  3  . NON FORMULARY Take 2 capsules by mouth every morning. Uric Acid Complex      . Omega-3 Fatty Acids (FISH OIL TRIPLE STRENGTH) 1400 MG CAPS Take 1,400 mg by mouth daily.      . rosuvastatin (CRESTOR) 5 MG tablet Take 1 tablet (5 mg total) by mouth daily.  90 tablet  3   No current facility-administered medications for this visit.    Discontinued Meds:   There are no discontinued medications.  Patient Active Problem List   Diagnosis Date Noted  . SOB (shortness of breath) on exertion 07/22/2013  . CAD S/P percutaneous coronary angioplasty 07/09/2013  . HTN (hypertension) 07/09/2013  . Hyperlipidemia 07/09/2013  . Chest pain 07/09/2013    LABS    Component Value Date/Time   NA 136 12/11/2009 1810   K 4.2  12/11/2009 1810   CL 99 12/11/2009 1810   CO2 26 12/11/2009 1810   GLUCOSE 173* 12/11/2009 1810   BUN 16 12/11/2009 1810   CREATININE 0.82 12/11/2009 1810   CALCIUM 9.3 12/11/2009 1810   GFRNONAA >60 12/11/2009 1810   GFRAA  Value: >60        The eGFR has been calculated using the MDRD equation. This calculation has not been validated in all clinical situations. eGFR's persistently <60 mL/min signify possible Chronic Kidney Disease. 12/11/2009 1810   CMP     Component Value Date/Time   NA 136 12/11/2009 1810   K 4.2 12/11/2009 1810   CL 99 12/11/2009 1810   CO2 26 12/11/2009 1810   GLUCOSE 173* 12/11/2009 1810   BUN 16 12/11/2009 1810   CREATININE 0.82 12/11/2009 1810   CALCIUM 9.3 12/11/2009 1810   PROT 7.8 12/11/2009 1810   ALBUMIN 4.3 12/11/2009 1810   AST 26 12/11/2009 1810   ALT 20 12/11/2009 1810   ALKPHOS 49 12/11/2009 1810   BILITOT 0.8 12/11/2009 1810   GFRNONAA >60 12/11/2009 1810   GFRAA  Value: >60        The eGFR has been calculated using the MDRD equation. This calculation has not  been validated in all clinical situations. eGFR's persistently <60 mL/min signify possible Chronic Kidney Disease. 12/11/2009 1810       Component Value Date/Time   WBC 14.6* 12/11/2009 1810   HGB 14.6 12/11/2009 1810   HCT 43.0 12/11/2009 1810   MCV 85.6 12/11/2009 1810    Lipid Panel  No results found for this basename: chol, trig, hdl, cholhdl, vldl, ldlcalc    ABG No results found for this basename: phart, pco2, pco2art, po2, po2art, hco3, tco2, acidbasedef, o2sat     No results found for this basename: TSH   BNP (last 3 results) No results found for this basename: PROBNP,  in the last 8760 hours Cardiac Panel (last 3 results) No results found for this basename: CKTOTAL, CKMB, TROPONINI, RELINDX,  in the last 72 hours  Iron/TIBC/Ferritin No results found for this basename: iron, tibc, ferritin     EKG Orders placed in visit on 05/12/13  . EKG     Prior Assessment and Plan Problem List as of 04/12/2014     Cardiovascular and Mediastinum   CAD S/P percutaneous coronary angioplasty   HTN (hypertension)     Other   Hyperlipidemia   Chest pain   SOB (shortness of breath) on exertion       Imaging: No results found.

## 2014-04-12 NOTE — Assessment & Plan Note (Signed)
No cardiac complaints. Continue statin, ASA, and BB. He is advised on wt loss and to increase exercise. 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 He will try to walk more.

## 2014-04-12 NOTE — Assessment & Plan Note (Signed)
Blood pressure is controlled currently. I will keep him on ACE and BB. He is advised on low sodium diet and to exercise more.

## 2014-08-22 IMAGING — NM NM MYOCAR SINGLE W/SPECT W/WALL MOTION & EF
1 series · 6 of 6 positions shown · non-contrast
Comparison: none

Ordering Physician: MARIO IGNACIO JEON

[facility] Physician: [REDACTED]al Data: 67 yo male with history of CAD with prior
angioplasty with recent chest pain
NUCLEAR MEDICINE STRESS MYOVIEW STUDY WITH SPECT AND LEFT
VENTRICULAR EJECTION FRACTION
Radionuclide Data: One-day rest/stress protocol performed with
[DATE] mCi of Pc-FFm Myoview.
Stress Data:  The patient underwent Lexiscan stress testing per
protocol. Baseline EKG showed right bundle branch block with
baseline ST depressions in the inferior leads and lateral
precordial leads. Baseline blood pressure 147/80. EKG showed no
diagnostic ischemic changes. Blood pressure response was
physiologic.
Scintigraphic Data: The patient was studied in 1-day rest stress
protocol. He was injected with 10 mCi technetium 99 labeled
sestamibi at rest, 30 mCi technetium 99 labeled sestamibi at
stress. Images were reconstructed the short vertical and
horizontal axes.
In the initial stress images there was mild decrease in uptake in
the inferior wall, likely due to subdiaphragmatic attentuation.
Otherwise normal perfusion. In the recovery images there is
improved uptake in this area with otherwise normal perfusion and
wall motion.
On review of the raw data there is subdiaphragmatic uptake.
On gating LVEF was calculated at 45% with overall normal wall
motion.

[cr cardiac tc low dose · 6.41mm/px · 6 of 64 frames shown]
[frame 6/64]
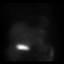
[frame 16/64]
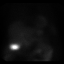
[frame 27/64]
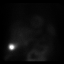
[frame 38/64]
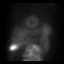
[frame 48/64]
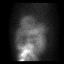
[frame 59/64]
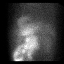

[6 of 6 positions shown; findings below may reference images not displayed]

IMPRESSION: Lexiscan Sestamibi: electrically non-diagnostic for
ischemia due to baseline ST/T changes. Sestamibi scan with the
probable subdiaphragmatic attenuation and normal perfusion, no
evidence of significant ischemia or scar.
LVEF calculated at 45%.

## 2014-08-22 IMAGING — NM NM MYOCAR SINGLE W/SPECT W/WALL MOTION & EF
1 series · 6 of 6 positions shown · non-contrast
Comparison: none

Ordering Physician: MARIO IGNACIO JEON

[facility] Physician: [REDACTED]al Data: 67 yo male with history of CAD with prior
angioplasty with recent chest pain
NUCLEAR MEDICINE STRESS MYOVIEW STUDY WITH SPECT AND LEFT
VENTRICULAR EJECTION FRACTION
Radionuclide Data: One-day rest/stress protocol performed with
[DATE] mCi of Pc-FFm Myoview.
Stress Data:  The patient underwent Lexiscan stress testing per
protocol. Baseline EKG showed right bundle branch block with
baseline ST depressions in the inferior leads and lateral
precordial leads. Baseline blood pressure 147/80. EKG showed no
diagnostic ischemic changes. Blood pressure response was
physiologic.
Scintigraphic Data: The patient was studied in 1-day rest stress
protocol. He was injected with 10 mCi technetium 99 labeled
sestamibi at rest, 30 mCi technetium 99 labeled sestamibi at
stress. Images were reconstructed the short vertical and
horizontal axes.
In the initial stress images there was mild decrease in uptake in
the inferior wall, likely due to subdiaphragmatic attentuation.
Otherwise normal perfusion. In the recovery images there is
improved uptake in this area with otherwise normal perfusion and
wall motion.
On review of the raw data there is subdiaphragmatic uptake.
On gating LVEF was calculated at 45% with overall normal wall
motion.

[Series 1: cs cardiac tc hi dose · 6.41mm/px · 6 of 512 frames shown]
[frame 43/512]
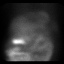
[frame 128/512]
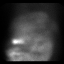
[frame 214/512]
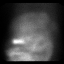
[frame 299/512]
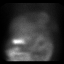
[frame 384/512]
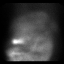
[frame 470/512]
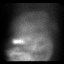

[6 of 6 positions shown; findings below may reference images not displayed]

IMPRESSION: Lexiscan Sestamibi: electrically non-diagnostic for
ischemia due to baseline ST/T changes. Sestamibi scan with the
probable subdiaphragmatic attenuation and normal perfusion, no
evidence of significant ischemia or scar.
LVEF calculated at 45%.

## 2014-10-31 ENCOUNTER — Other Ambulatory Visit: Payer: Self-pay | Admitting: Cardiovascular Disease

## 2014-10-31 MED ORDER — METOPROLOL SUCCINATE ER 50 MG PO TB24: ORAL_TABLET | ORAL | Status: DC

## 2014-10-31 NOTE — Telephone Encounter (Signed)
TOPROL-XL) 50 MG needs refill

## 2014-10-31 NOTE — Telephone Encounter (Signed)
Received fax refill request  Rx # [id number] Medication:  Metoprolol ER 50 mg tab Qty 135 Sig:  Take one and one-half tablets by mouth once daily. Take with or immediately following a meal. Physician:  Bronson Ing

## 2014-10-31 NOTE — Telephone Encounter (Signed)
Refill complete 

## 2014-11-07 ENCOUNTER — Ambulatory Visit: Payer: Medicare Other | Admitting: Cardiovascular Disease

## 2014-11-24 ENCOUNTER — Ambulatory Visit (INDEPENDENT_AMBULATORY_CARE_PROVIDER_SITE_OTHER): Payer: Medicare Other | Admitting: Adult Health

## 2014-11-24 ENCOUNTER — Encounter: Payer: Self-pay | Admitting: Adult Health

## 2014-11-24 VITALS — BP 146/64 | HR 100 | Ht 69.0 in | Wt 241.0 lb

## 2014-11-24 DIAGNOSIS — E785 Hyperlipidemia, unspecified: Secondary | ICD-10-CM

## 2014-11-24 DIAGNOSIS — I2510 Atherosclerotic heart disease of native coronary artery without angina pectoris: Secondary | ICD-10-CM

## 2014-11-24 DIAGNOSIS — Z9861 Coronary angioplasty status: Secondary | ICD-10-CM

## 2014-11-24 MED ORDER — AMLODIPINE BESYLATE 5 MG PO TABS: 5.0000 mg | ORAL_TABLET | Freq: Every day | ORAL | Status: DC

## 2014-11-24 MED ORDER — METOPROLOL SUCCINATE ER 50 MG PO TB24: ORAL_TABLET | ORAL | Status: DC

## 2014-11-24 NOTE — Progress Notes (Deleted)
Name: [name] [name].    DOB: 1946-09-26  Age: 69 y.o.  MR#: [id number]       PCP:  Sherrie Mustache, MD      Insurance: Payor: BLUE CROSS BLUE SHIELD MEDICARE / Plan: BCBS MEDICARE / Product Type: *No Product type* /   CC:    Chief Complaint  Patient presents with  . Coronary Artery Disease  . Hypertension    VS Filed Vitals:   11/24/14 1248  BP: 146/64  Pulse: 100  Height: 5' 9"  (1.753 m)  Weight: 241 lb (109.317 kg)  SpO2: 95%    Weights Current Weight  11/24/14 241 lb (109.317 kg)  04/12/14 246 lb (111.585 kg)  10/11/13 252 lb (114.306 kg)    Blood Pressure  BP Readings from Last 3 Encounters:  11/24/14 146/64  04/12/14 126/64  10/11/13 166/96     Admit date:  (Not on file) Last encounter with RMR:  Visit date not found   Allergy Review of patient's allergies indicates no known allergies.  Current Outpatient Prescriptions  Medication Sig Dispense Refill  . amLODipine (NORVASC) 5 MG tablet Take 5 mg by mouth daily.     Marland Kitchen aspirin 81 MG chewable tablet Chew 81 mg by mouth daily.    . Chromium-Cinnamon (CINNAMON PLUS CHROMIUM) [phone] MCG-MG CAPS Take 2 capsules by mouth daily.    . Garlic 0321 MG CAPS Take 1,000 mg by mouth daily.    . metFORMIN (GLUCOPHAGE) 500 MG tablet Take 500 mg by mouth 2 (two) times daily with a meal.    . metoprolol succinate (TOPROL-XL) 50 MG 24 hr tablet Take 75 mg by mouth daily. Take with or immediately following a meal. 135 tablet 2  . Multiple Vitamin (MULTIVITAMIN) tablet Take 1 tablet by mouth daily.    . nitroGLYCERIN (NITROSTAT) 0.4 MG SL tablet Place 1 tablet (0.4 mg total) under the tongue every 5 (five) minutes as needed for chest pain. 25 tablet 3  . NON FORMULARY Take 2 capsules by mouth every morning. Uric Acid Complex    . Omega-3 Fatty Acids (FISH OIL TRIPLE STRENGTH) 1400 MG CAPS Take 1,400 mg by mouth daily.     No current facility-administered medications for this visit.    Discontinued Meds:     Medications Discontinued During This Encounter  Medication Reason  . B Complex-C (SUPER B COMPLEX PO) Error  . fenofibrate micronized (ANTARA) 130 MG capsule Error  . glipiZIDE (GLUCOTROL XL) 5 MG 24 hr tablet Error  . isosorbide mononitrate (IMDUR) 30 MG 24 hr tablet Error  . lisinopril (PRINIVIL,ZESTRIL) 10 MG tablet Error  . rosuvastatin (CRESTOR) 5 MG tablet Error    Patient Active Problem List   Diagnosis Date Noted  . SOB (shortness of breath) on exertion 07/22/2013  . CAD S/P percutaneous coronary angioplasty 07/09/2013  . HTN (hypertension) 07/09/2013  . Hyperlipidemia 07/09/2013  . Chest pain 07/09/2013    LABS    Component Value Date/Time   NA 136 12/11/2009 1810   K 4.2 12/11/2009 1810   CL 99 12/11/2009 1810   CO2 26 12/11/2009 1810   GLUCOSE 173* 12/11/2009 1810   BUN 16 12/11/2009 1810   CREATININE 0.82 12/11/2009 1810   CALCIUM 9.3 12/11/2009 1810   GFRNONAA >60 12/11/2009 1810   GFRAA  12/11/2009 1810    >60        The eGFR has been calculated using the MDRD equation. This calculation has not been validated in all clinical situations. eGFR's  persistently <60 mL/min signify possible Chronic Kidney Disease.   CMP     Component Value Date/Time   NA 136 12/11/2009 1810   K 4.2 12/11/2009 1810   CL 99 12/11/2009 1810   CO2 26 12/11/2009 1810   GLUCOSE 173* 12/11/2009 1810   BUN 16 12/11/2009 1810   CREATININE 0.82 12/11/2009 1810   CALCIUM 9.3 12/11/2009 1810   PROT 7.8 12/11/2009 1810   ALBUMIN 4.3 12/11/2009 1810   AST 26 12/11/2009 1810   ALT 20 12/11/2009 1810   ALKPHOS 49 12/11/2009 1810   BILITOT 0.8 12/11/2009 1810   GFRNONAA >60 12/11/2009 1810   GFRAA  12/11/2009 1810    >60        The eGFR has been calculated using the MDRD equation. This calculation has not been validated in all clinical situations. eGFR's persistently <60 mL/min signify possible Chronic Kidney Disease.       Component Value Date/Time   WBC 14.6*  12/11/2009 1810   HGB 14.6 12/11/2009 1810   HCT 43.0 12/11/2009 1810   MCV 85.6 12/11/2009 1810    Lipid Panel  No results found for: CHOL, TRIG, HDL, CHOLHDL, VLDL, LDLCALC, LDLDIRECT  ABG No results found for: PHART, PCO2ART, PO2ART, HCO3, TCO2, ACIDBASEDEF, O2SAT   No results found for: TSH BNP (last 3 results) No results for input(s): PROBNP in the last 8760 hours. Cardiac Panel (last 3 results) No results for input(s): CKTOTAL, CKMB, TROPONINI, RELINDX in the last 72 hours.  Iron/TIBC/Ferritin/ %Sat No results found for: IRON, TIBC, FERRITIN, IRONPCTSAT   EKG Orders placed or performed in visit on 04/12/14  . EKG 12-Lead     Prior Assessment and Plan Problem List as of 11/24/2014      Cardiovascular and Mediastinum   CAD S/P percutaneous coronary angioplasty   Last Assessment & Plan 04/12/2014 Office Visit Written 04/12/2014  4:10 PM by Lendon Colonel, NP    No cardiac complaints. Continue statin, ASA, and BB. He is advised on wt loss and to increase exercise. 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 He will try to walk more.       HTN (hypertension)   Last Assessment & Plan 04/12/2014 Office Visit Written 04/12/2014  4:11 PM by Lendon Colonel, NP    Blood pressure is controlled currently. I will keep him on ACE and BB. He is advised on low sodium diet and to exercise more.        Other   Hyperlipidemia   Last Assessment & Plan 04/12/2014 Office Visit Written 04/12/2014  4:12 PM by Lendon Colonel, NP    Low cholesterol diet is recommended. Continue statin.       Chest pain   SOB (shortness of breath) on exertion       Imaging: No results found.

## 2014-11-24 NOTE — Assessment & Plan Note (Signed)
BP is labile with anxiety and anger. Normally lower at home. I have advised him to continue to monitor his BP at home and to avoid salt. Will continue current regimen and give him refills on metoprolol and amlodipine. Labs are drawn by his PCP. Will see him in 6 months.

## 2014-11-24 NOTE — Patient Instructions (Signed)
Your physician wants you to follow-up in: 6 months with Kathryn Lawrence, NP. You will receive a reminder letter in the mail two months in advance. If you don't receive a letter, please call our office to schedule the follow-up appointment.  Your physician recommends that you continue on your current medications as directed. Please refer to the Current Medication list given to you today.  Thank you for choosing [facility]!   

## 2014-11-24 NOTE — Assessment & Plan Note (Signed)
No symptoms at this time. Will continue lifestyle and risk management,

## 2014-11-24 NOTE — Progress Notes (Signed)
    HPI: Mr. Lubrano is a 69 year old patient of Dr. Bronson Ing that was normally seen in the [facility] office, that we follow for ongoing assessment and management of CAD, status post PCI, hypertension, hyperlipidemia.  He has occasional dyspnea on exertion.  He was last seen in the office in June of 2015.  He was continued on his current medication regimen.  He had no cardiac complaints at that time.   He is doing well and without cardiac complaints. He is watching his diet. Has lost 10 lbs this year. He is due for DOT physical tomorrow and usually has high BP when he is evaluated there. Normally 147'W systolic at home.  No Known Allergies  Current Outpatient Prescriptions  Medication Sig Dispense Refill  . amLODipine (NORVASC) 5 MG tablet Take 5 mg by mouth daily.     Marland Kitchen aspirin 81 MG chewable tablet Chew 81 mg by mouth daily.    . Chromium-Cinnamon (CINNAMON PLUS CHROMIUM) [phone] MCG-MG CAPS Take 2 capsules by mouth daily.    . Garlic 9295 MG CAPS Take 1,000 mg by mouth daily.    . metFORMIN (GLUCOPHAGE) 500 MG tablet Take 500 mg by mouth 2 (two) times daily with a meal.    . metoprolol succinate (TOPROL-XL) 50 MG 24 hr tablet Take 75 mg by mouth daily. Take with or immediately following a meal. 135 tablet 2  . Multiple Vitamin (MULTIVITAMIN) tablet Take 1 tablet by mouth daily.    . nitroGLYCERIN (NITROSTAT) 0.4 MG SL tablet Place 1 tablet (0.4 mg total) under the tongue every 5 (five) minutes as needed for chest pain. 25 tablet 3  . NON FORMULARY Take 2 capsules by mouth every morning. Uric Acid Complex    . Omega-3 Fatty Acids (FISH OIL TRIPLE STRENGTH) 1400 MG CAPS Take 1,400 mg by mouth daily.     No current facility-administered medications for this visit.    Past Medical History  Diagnosis Date  . Diabetes   . HTN (hypertension)   . Gout   . Dyslipidemia   . CAD (coronary artery disease)   . Cancer     Past Surgical History  Procedure Laterality Date  . Back surgery    .  Left shoulder surgery    . Left knee surgery    . Right knee surgery      ROS: Complete review of systems performed and found to be negative unless outlined above  PHYSICAL EXAM BP 146/64 mmHg  Pulse 100  Ht 5\' 9"  (1.753 m)  Wt 241 lb (109.317 kg)  BMI 35.57 kg/m2  SpO2 95% General: Well developed, well nourished, in no acute distress Head: Eyes PERRLA, No xanthomas.   Normal cephalic and atramatic  Lungs: Clear bilaterally to auscultation and percussion. Heart: HRRR S1 S2, without MRG.  Pulses are 2+ & equal.            No carotid bruit. No JVD.  No abdominal bruits. No femoral bruits. Abdomen: Bowel sounds are positive, abdomen soft and non-tender without masses or                  Hernia's noted. Msk:  Back normal, normal gait. Normal strength and tone for age. Extremities: No clubbing, cyanosis or edema.  DP +1 Neuro: Alert and oriented X 3. Psych:  Good affect, responds appropriately  ASSESSMENT AND PLAN

## 2014-11-24 NOTE — Assessment & Plan Note (Signed)
Labs are evaluated by PCP. He is on fish oil but no statin. This is recommended if LDL is over 70 in diabetic patients.

## 2014-11-25 ENCOUNTER — Ambulatory Visit (INDEPENDENT_AMBULATORY_CARE_PROVIDER_SITE_OTHER): Payer: Self-pay | Admitting: Family Medicine

## 2014-11-25 VITALS — Temp 97.8°F | Ht 68.5 in | Wt 242.0 lb

## 2014-11-25 DIAGNOSIS — Z024 Encounter for examination for driving license: Secondary | ICD-10-CM

## 2014-11-25 LAB — POCT URINALYSIS DIPSTICK
Bilirubin, UA: NEGATIVE
Glucose, UA: NEGATIVE
Ketones, UA: NEGATIVE
Leukocytes, UA: NEGATIVE
Nitrite, UA: NEGATIVE
Spec Grav, UA: 1.01
Urobilinogen, UA: NEGATIVE
pH, UA: 5

## 2014-11-25 NOTE — Progress Notes (Signed)
   Subjective:    Patient ID: [name] [name]., male    DOB: 1946/06/30, 69 y.o.   MRN: [id number]  HPI Patient is here for DOT exam.  He has hx of HTN.  Review of Systems  Constitutional: Negative for fever.  HENT: Negative for ear pain.   Eyes: Negative for discharge.  Respiratory: Negative for cough.   Cardiovascular: Negative for chest pain.  Gastrointestinal: Negative for abdominal distention.  Endocrine: Negative for polyuria.  Genitourinary: Negative for difficulty urinating.  Musculoskeletal: Negative for gait problem and neck pain.  Skin: Negative for color change and rash.  Neurological: Negative for speech difficulty and headaches.  Psychiatric/Behavioral: Negative for agitation.       Objective:    Temp(Src) 97.8 F (36.6 C) (Oral)  Ht 5' 8.5" (1.74 m)  Wt 242 lb (109.77 kg)  BMI 36.26 kg/m2 Physical Exam  Constitutional: He is oriented to person, place, and time. He appears well-developed and well-nourished.  HENT:  Head: Normocephalic and atraumatic.  Mouth/Throat: Oropharynx is clear and moist.  Eyes: Pupils are equal, round, and reactive to light.  Neck: Normal range of motion. Neck supple.  Cardiovascular: Normal rate and regular rhythm.   No murmur heard. Pulmonary/Chest: Effort normal and breath sounds normal.  Abdominal: Soft. Bowel sounds are normal. There is no tenderness.  Neurological: He is alert and oriented to person, place, and time.  Skin: Skin is warm and dry.  Psychiatric: He has a normal mood and affect.          Assessment & Plan:     ICD-9-CM ICD-10-CM   1. 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  Lysbeth Penner FNP

## 2014-11-29 ENCOUNTER — Ambulatory Visit: Payer: Medicare Other | Admitting: Cardiovascular Disease

## 2015-10-27 ENCOUNTER — Encounter: Payer: Self-pay | Admitting: Family Medicine

## 2015-10-27 ENCOUNTER — Ambulatory Visit (INDEPENDENT_AMBULATORY_CARE_PROVIDER_SITE_OTHER): Payer: Self-pay | Admitting: Family Medicine

## 2015-10-27 VITALS — BP 140/75 | HR 86 | Temp 97.2°F | Ht 68.5 in | Wt 244.2 lb

## 2015-10-27 DIAGNOSIS — Z024 Encounter for examination for driving license: Secondary | ICD-10-CM

## 2015-10-27 DIAGNOSIS — Z139 Encounter for screening, unspecified: Secondary | ICD-10-CM

## 2015-10-27 LAB — POCT URINALYSIS DIPSTICK
Bilirubin, UA: NEGATIVE
Glucose, UA: NEGATIVE
Ketones, UA: NEGATIVE
Leukocytes, UA: NEGATIVE
Nitrite, UA: NEGATIVE
Spec Grav, UA: >=1.03
Urobilinogen, UA: NEGATIVE
pH, UA: 5

## 2015-10-27 NOTE — Progress Notes (Signed)
Subjective:  Patient ID: [name] Sites., male    DOB: 1946-09-06  Age: 69 y.o. MRN: [id number]  CC: DOT PE   HPI [name] [name]. presents for PE for drivers license  Avg glucose on monitor brought with pt. Is 188 for 3 mos. Seea his MD on Monday for A1c. No angina sx for many years - over 10 years. Has chronic proteinuria followed bby his MD.  History [name] [name] has a past medical history of Diabetes; HTN (hypertension); Gout; Dyslipidemia; CAD (coronary artery disease); and Cancer.   He has past surgical history that includes Back surgery; left shoulder surgery; left knee surgery; and right knee surgery.   His family history includes Colon cancer (age of onset: 55) in his brother; Heart attack (age of onset: 8) in his father.He reports that he quit smoking about 26 years ago. His smoking use included Cigarettes. He started smoking about 62 years ago. He smoked 5.00 packs per day. His smokeless tobacco use includes Snuff. He reports that he does not drink alcohol or use illicit drugs.  Outpatient Prescriptions Prior to Visit  Medication Sig Dispense Refill  . aspirin 81 MG chewable tablet Chew 81 mg by mouth daily.    . Chromium-Cinnamon (CINNAMON PLUS CHROMIUM) [phone] MCG-MG CAPS Take 2 capsules by mouth daily.    . Garlic [phone] MG CAPS Take 1,000 mg by mouth daily.    . metoprolol succinate (TOPROL-XL) 50 MG 24 hr tablet Take 75 mg by mouth daily. Take with or immediately following a meal. 135 tablet 2  . Multiple Vitamin (MULTIVITAMIN) tablet Take 1 tablet by mouth daily.    . metFORMIN (GLUCOPHAGE) 500 MG tablet Take 500 mg by mouth 2 (two) times daily with a meal.    . amLODipine (NORVASC) 5 MG tablet Take 1 tablet (5 mg total) by mouth daily. 90 tablet 3  . nitroGLYCERIN (NITROSTAT) 0.4 MG SL tablet Place 1 tablet (0.4 mg total) under the tongue every 5 (five) minutes as needed for chest pain. 25 tablet 3  . NON FORMULARY Take 2 capsules by mouth every morning. Uric Acid  Complex    . Omega-3 Fatty Acids (FISH OIL TRIPLE STRENGTH) 1400 MG CAPS Take 1,400 mg by mouth daily.     No facility-administered medications prior to visit.    ROS Review of Systems  Constitutional: Negative for fever, chills, diaphoresis, activity change, appetite change, fatigue and unexpected weight change.  HENT: Negative for congestion, ear pain, hearing loss, postnasal drip, rhinorrhea, sore throat, tinnitus and trouble swallowing.   Eyes: Negative for photophobia, pain, discharge and redness.  Respiratory: Negative for apnea, cough, choking, chest tightness, shortness of breath, wheezing and stridor.   Cardiovascular: Negative for chest pain, palpitations and leg swelling.  Gastrointestinal: Negative for nausea, vomiting, abdominal pain, diarrhea, constipation, blood in stool and abdominal distention.  Endocrine: Negative for cold intolerance, heat intolerance, polydipsia, polyphagia and polyuria.  Genitourinary: Negative for dysuria, urgency, frequency, hematuria, flank pain, enuresis, difficulty urinating and genital sores.  Musculoskeletal: Negative for joint swelling and arthralgias.  Skin: Negative for color change, rash and wound.  Allergic/Immunologic: Negative for immunocompromised state.  Neurological: Negative for dizziness, tremors, seizures, syncope, facial asymmetry, speech difficulty, weakness, light-headedness, numbness and headaches.  Hematological: Does not bruise/bleed easily.  Psychiatric/Behavioral: Negative for suicidal ideas, hallucinations, behavioral problems, confusion, sleep disturbance, dysphoric mood, decreased concentration and agitation. The patient is not nervous/anxious and is not hyperactive.     Objective:  BP 140/75 mmHg  Pulse 86  Temp(Src) 97.2 F (36.2 C) (Oral)  Ht 5' 8.5" (1.74 m)  Wt 244 lb 3.2 oz (110.768 kg)  BMI 36.59 kg/m2  SpO2 98%  BP Readings from Last 3 Encounters:  10/27/15 140/75  11/24/14 146/64  04/12/14 126/64     Wt Readings from Last 3 Encounters:  10/27/15 244 lb 3.2 oz (110.768 kg)  11/25/14 242 lb (109.77 kg)  11/24/14 241 lb (109.317 kg)     Physical Exam  Constitutional: He is oriented to person, place, and time. He appears well-developed and well-nourished. No distress.  Obese   HENT:  Head: Normocephalic and atraumatic.  Right Ear: External ear normal.  Left Ear: External ear normal.  Nose: Nose normal.  Mouth/Throat: Oropharynx is clear and moist.  Eyes: Conjunctivae and EOM are normal. Pupils are equal, round, and reactive to light.  Neck: Normal range of motion. Neck supple. No thyromegaly present.  Cardiovascular: Normal rate, regular rhythm and normal heart sounds.   No murmur heard. Pulmonary/Chest: Effort normal and breath sounds normal. No respiratory distress. He has no wheezes. He has no rales.  Abdominal: Soft. Bowel sounds are normal. He exhibits no distension. There is no tenderness.  Lymphadenopathy:    He has no cervical adenopathy.  Neurological: He is alert and oriented to person, place, and time. He has normal reflexes.  Skin: Skin is warm and dry.  Psychiatric: He has a normal mood and affect. His behavior is normal. Judgment and thought content normal.     Lab Results  Component Value Date   WBC 14.6* 12/11/2009   HGB 14.6 12/11/2009   HCT 43.0 12/11/2009   PLT 243 12/11/2009   GLUCOSE 173* 12/11/2009   ALT 20 12/11/2009   AST 26 12/11/2009   NA 136 12/11/2009   K 4.2 12/11/2009   CL 99 12/11/2009   CREATININE 0.82 12/11/2009   BUN 16 12/11/2009   CO2 26 12/11/2009    Nm Myocar Single W/spect W/wall Motion And Ef  07/22/2013  Ordering Physician: Kate Sable Reading Physician: Carlyle Dolly Clinical Data: 69 yo male with history of CAD with prior angioplasty with recent chest pain NUCLEAR MEDICINE STRESS MYOVIEW STUDY WITH SPECT AND LEFT VENTRICULAR EJECTION FRACTION Radionuclide Data: One-day rest/stress protocol performed with 10/30  mCi of Tc-55m Myoview. Stress Data:  The patient underwent Lexiscan stress testing per protocol. Baseline EKG showed right bundle branch block with baseline ST depressions in the inferior leads and lateral precordial leads. Baseline blood pressure 147/80. EKG showed no diagnostic ischemic changes. Blood pressure response was physiologic. Scintigraphic Data: The patient was studied in 1-day rest stress protocol. He was injected with 10 mCi technetium 99 labeled sestamibi at rest, 30 mCi technetium 99 labeled sestamibi at stress. Images were reconstructed the short vertical and horizontal axes. In the initial stress images there was mild decrease in uptake in the inferior wall, likely due to subdiaphragmatic attentuation. Otherwise normal perfusion. In the recovery images there is improved uptake in this area with otherwise normal perfusion and wall motion. On review of the raw data there is subdiaphragmatic uptake. On gating LVEF was calculated at 45% with overall normal wall motion. Impression: Lexiscan Sestamibi: electrically non-diagnostic for ischemia due to baseline ST/T changes. Sestamibi scan with the probable subdiaphragmatic attenuation and normal perfusion, no evidence of significant ischemia or scar. LVEF calculated at 45%. 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  Diagnoses  and all orders for this visit:  Screening -     POCT urinalysis dipstick   I have discontinued Mr. Hassell's NON FORMULARY, FISH OIL TRIPLE STRENGTH, nitroGLYCERIN, and amLODipine. I am also having him maintain his aspirin, multivitamin, Garlic, Chromium-Cinnamon, metoprolol succinate, tamsulosin, lisinopril, and metFORMIN.  Meds ordered this encounter  Medications  . tamsulosin (FLOMAX) 0.4 MG CAPS capsule    Sig: Take 0.4 mg by mouth.  Marland Kitchen lisinopril (PRINIVIL,ZESTRIL) 5 MG tablet    Sig:     Refill:  0  . metFORMIN (GLUCOPHAGE) 1000 MG tablet     Sig:     Refill:  5     Follow-up: No Follow-up on file.  Claretta Fraise, M.D.

## 2015-11-01 ENCOUNTER — Ambulatory Visit (INDEPENDENT_AMBULATORY_CARE_PROVIDER_SITE_OTHER): Payer: Medicare Other | Admitting: Cardiovascular Disease

## 2015-11-01 ENCOUNTER — Encounter: Payer: Self-pay | Admitting: Cardiovascular Disease

## 2015-11-01 ENCOUNTER — Encounter: Payer: Self-pay | Admitting: *Deleted

## 2015-11-01 VITALS — BP 144/92 | HR 83 | Ht 68.0 in | Wt 240.0 lb

## 2015-11-01 DIAGNOSIS — Z9861 Coronary angioplasty status: Secondary | ICD-10-CM

## 2015-11-01 DIAGNOSIS — I2510 Atherosclerotic heart disease of native coronary artery without angina pectoris: Secondary | ICD-10-CM | POA: Diagnosis not present

## 2015-11-01 DIAGNOSIS — I10 Essential (primary) hypertension: Secondary | ICD-10-CM | POA: Diagnosis not present

## 2015-11-01 DIAGNOSIS — E785 Hyperlipidemia, unspecified: Secondary | ICD-10-CM

## 2015-11-01 MED ORDER — LISINOPRIL 10 MG PO TABS: 10.0000 mg | ORAL_TABLET | Freq: Every day | ORAL | Status: DC

## 2015-11-01 NOTE — Progress Notes (Signed)
Patient ID: [name] [name]., male   DOB: 06/22/1946, 69 y.o.   MRN: [id number]      SUBJECTIVE: The patient presents for follow-up of coronary artery disease with prior percutaneous coronary intervention in roughly 1994, hypertension, and dyslipidemia. He denies chest pain, palpitations, leg swelling, and shortness of breath. He was recently prescribed a cholesterol medication and tells me that his cholesterol was "really high". He does not recall the name of the medication. 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 He does not think he needs a repeat stress test at this time.   Review of Systems: As per "subjective", otherwise negative.  No Known Allergies  Current Outpatient Prescriptions  Medication Sig Dispense Refill  . aspirin 81 MG chewable tablet Chew 81 mg by mouth daily.    . Chromium-Cinnamon (CINNAMON PLUS CHROMIUM) [phone] MCG-MG CAPS Take 2 capsules by mouth daily.    . Garlic [phone] MG CAPS Take 1,000 mg by mouth daily.    Marland Kitchen lisinopril (PRINIVIL,ZESTRIL) 5 MG tablet Take 5 mg by mouth daily.   0  . metFORMIN (GLUCOPHAGE) 1000 MG tablet 1,000 mg 2 (two) times daily with a meal.   5  . metoprolol succinate (TOPROL-XL) 50 MG 24 hr tablet Take 75 mg by mouth daily. Take with or immediately following a meal. 135 tablet 2  . Multiple Vitamin (MULTIVITAMIN) tablet Take 1 tablet by mouth daily.    . tamsulosin (FLOMAX) 0.4 MG CAPS capsule Take 0.4 mg by mouth.     No current facility-administered medications for this visit.    Past Medical History  Diagnosis Date  . Diabetes ([place])   . HTN (hypertension)   . Gout   . Dyslipidemia   . CAD (coronary artery disease)   . Cancer [facility])     Past Surgical History  Procedure Laterality Date  . Back surgery    . Left shoulder surgery    . Left knee surgery    . Right knee surgery      Social History   Social History  . Marital Status: Married    Spouse Name: N/A  . Number of  Children: N/A  . Years of Education: N/A   Occupational History  . Not on file.   Social History Main Topics  . Smoking status: Former Smoker -- 5.00 packs/day    Types: Cigarettes    Start date: 11/24/1952    Quit date: 11/24/1988  . Smokeless tobacco: Current User    Types: Snuff  . Alcohol Use: No  . Drug Use: No  . Sexual Activity: Not on file   Other Topics Concern  . Not on file   Social History Narrative     Filed Vitals:   11/01/15 0803  BP: 144/92  Pulse: 83  Height: 5\' 8"  (1.727 m)  Weight: 240 lb (108.863 kg)  SpO2: 98%    PHYSICAL EXAM General: NAD HEENT: Normal. Neck: No JVD, no thyromegaly. Lungs: Clear to auscultation bilaterally with normal respiratory effort. CV: Nondisplaced PMI.  Regular rate and rhythm, normal S1/S2, no S3/S4, no murmur. No pretibial or periankle edema.  No carotid bruit.  Abdomen: Firm, obese.  Neurologic: Alert and oriented x 3.  Psych: Normal affect. Skin: Normal. Musculoskeletal: No gross deformities. Extremities: No clubbing or cyanosis.   ECG: Most recent ECG reviewed.      ASSESSMENT AND PLAN: 1. CAD with prior PCI: Symptomatically stable. Continue ASA and metoprolol. He will call back and inform me  as to what cholesterol medication he is taking. He stopped Crestor due to side effects.  2. Essential HTN: Elevated today but lisinopril increased this week by PCP to 10 mg. No changes.  3. Dyslipidemia: He will call back and inform me as to what cholesterol medication he is taking. He stopped Crestor due to side effects. Obtain copy of lipids from PCP.  Dispo: f/u 1 year.  Kate Sable, M.D., F.A.C.C.

## 2015-11-01 NOTE — Patient Instructions (Addendum)
   Call office with name of cholesterol medication. Continue all other medications.   Your physician wants you to follow up in:  1 year.  You will receive a reminder letter in the mail one-two months in advance.  If you don't receive a letter, please call our office to schedule the follow up appointment

## 2015-11-01 NOTE — Addendum Note (Signed)
Addended by: Laurine Blazer on: 11/01/2015 10:52 AM   Modules accepted: Medications

## 2016-02-14 ENCOUNTER — Other Ambulatory Visit: Payer: Self-pay | Admitting: Cardiovascular Disease

## 2016-02-15 ENCOUNTER — Other Ambulatory Visit (HOSPITAL_COMMUNITY): Payer: Self-pay | Admitting: Physician Assistant

## 2016-02-15 ENCOUNTER — Ambulatory Visit (HOSPITAL_COMMUNITY)
Admission: RE | Admit: 2016-02-15 | Discharge: 2016-02-15 | Disposition: A | Discharge status: Home / Self Care | Payer: Medicare Other | Source: Ambulatory Visit | Attending: Physician Assistant | Admitting: Physician Assistant

## 2016-02-15 DIAGNOSIS — M19011 Primary osteoarthritis, right shoulder: Secondary | ICD-10-CM | POA: Insufficient documentation

## 2016-02-15 DIAGNOSIS — M19071 Primary osteoarthritis, right ankle and foot: Secondary | ICD-10-CM | POA: Diagnosis not present

## 2016-02-15 DIAGNOSIS — R52 Pain, unspecified: Secondary | ICD-10-CM

## 2016-02-15 DIAGNOSIS — M7731 Calcaneal spur, right foot: Secondary | ICD-10-CM | POA: Insufficient documentation

## 2016-02-15 DIAGNOSIS — M25511 Pain in right shoulder: Secondary | ICD-10-CM | POA: Insufficient documentation

## 2016-02-15 DIAGNOSIS — M79671 Pain in right foot: Secondary | ICD-10-CM | POA: Insufficient documentation

## 2016-02-29 ENCOUNTER — Other Ambulatory Visit (HOSPITAL_COMMUNITY): Payer: Self-pay | Admitting: Physician Assistant

## 2016-02-29 DIAGNOSIS — R52 Pain, unspecified: Secondary | ICD-10-CM

## 2016-04-01 ENCOUNTER — Ambulatory Visit (HOSPITAL_COMMUNITY)
Admission: RE | Admit: 2016-04-01 | Discharge: 2016-04-01 | Disposition: A | Discharge status: Home / Self Care | Payer: Medicare Other | Source: Ambulatory Visit | Attending: Physician Assistant | Admitting: Physician Assistant

## 2016-04-01 DIAGNOSIS — M25511 Pain in right shoulder: Secondary | ICD-10-CM | POA: Diagnosis present

## 2016-04-01 DIAGNOSIS — S46021A Laceration of muscle(s) and tendon(s) of the rotator cuff of right shoulder, initial encounter: Secondary | ICD-10-CM | POA: Diagnosis not present

## 2016-04-01 DIAGNOSIS — G8929 Other chronic pain: Secondary | ICD-10-CM | POA: Insufficient documentation

## 2016-04-01 DIAGNOSIS — R52 Pain, unspecified: Secondary | ICD-10-CM

## 2016-04-01 DIAGNOSIS — X58XXXA Exposure to other specified factors, initial encounter: Secondary | ICD-10-CM | POA: Insufficient documentation

## 2016-04-02 ENCOUNTER — Telehealth: Payer: Self-pay | Admitting: *Deleted

## 2016-04-02 NOTE — Telephone Encounter (Signed)
Can proceed with surgery. No prior cardiac testing required unless he has had a change in symptoms since his last visit with me, at which time he was doing well. Would continue ASA 81 mg if deemed feasible by orthopedic surgeon.

## 2016-04-02 NOTE — Telephone Encounter (Signed)
Noted, will fax note back to [employer].

## 2016-04-02 NOTE — Telephone Encounter (Signed)
Fax received from [facility] - request clearance for pending right shoulder scope and rotator cuff repair & if any medications need to be held prior to procedure.

## 2016-11-08 ENCOUNTER — Encounter: Payer: Self-pay | Admitting: Cardiovascular Disease

## 2016-11-08 ENCOUNTER — Encounter: Payer: Self-pay | Admitting: *Deleted

## 2016-11-08 ENCOUNTER — Ambulatory Visit (INDEPENDENT_AMBULATORY_CARE_PROVIDER_SITE_OTHER): Payer: Medicare Other | Admitting: Cardiovascular Disease

## 2016-11-08 VITALS — BP 139/79 | HR 74 | Ht 68.0 in | Wt 235.0 lb

## 2016-11-08 DIAGNOSIS — I10 Essential (primary) hypertension: Secondary | ICD-10-CM | POA: Diagnosis not present

## 2016-11-08 DIAGNOSIS — E7800 Pure hypercholesterolemia, unspecified: Secondary | ICD-10-CM | POA: Diagnosis not present

## 2016-11-08 DIAGNOSIS — I25118 Atherosclerotic heart disease of native coronary artery with other forms of angina pectoris: Secondary | ICD-10-CM | POA: Diagnosis not present

## 2016-11-08 DIAGNOSIS — Z716 Tobacco abuse counseling: Secondary | ICD-10-CM | POA: Diagnosis not present

## 2016-11-08 MED ORDER — VARENICLINE TARTRATE 0.5 MG X 11 & 1 MG X 42 PO MISC: ORAL | 0 refills | Status: DC

## 2016-11-08 MED ORDER — VARENICLINE TARTRATE 0.5 MG X 11 & 1 MG X 42 PO MISC
ORAL | 0 refills | Status: DC
Start: 2016-11-08 — End: 2017-11-18

## 2016-11-08 NOTE — Progress Notes (Signed)
SUBJECTIVE: The patient presents for follow-up of coronary artery disease with prior percutaneous coronary intervention (angioplasty, no stent) in the early 1990's, hypertension, and dyslipidemia. He has been intolerant to multiple cholesterol medications. He has shortness of breath in very cold weather and hot and humid weather when walking on level ground but otherwise feels well. Has not had to use nitroglycerin. 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 He smoked cigarettes since the age of 32 for 55 years and then quit due to asthma. He now chews and dips tobacco. He wants to quit altogether.  ECG performed today shows sinus rhythm with right bundle branch block, left anterior fascicular block, and nonspecific ST segment abnormalities.    Review of Systems: As per "subjective", otherwise negative.  Allergies  Allergen Reactions  . Simvastatin     Other reaction(s): Dizziness    Current Outpatient Prescriptions  Medication Sig Dispense Refill  . allopurinol (ZYLOPRIM) 300 MG tablet Take 1 tablet by mouth daily as needed.  6  . aspirin 81 MG chewable tablet Chew 81 mg by mouth daily.    . Chromium-Cinnamon (CINNAMON PLUS CHROMIUM) [phone] MCG-MG CAPS Take 2 capsules by mouth daily.    Marland Kitchen COLCRYS 0.6 MG tablet Take 1 tablet by mouth daily as needed.  2  . fenofibrate (TRICOR) 145 MG tablet Take 145 mg by mouth every morning.    Marland Kitchen lisinopril (PRINIVIL,ZESTRIL) 20 MG tablet Take 1 tablet by mouth daily.  1  . metFORMIN (GLUCOPHAGE) 1000 MG tablet 1,000 mg 2 (two) times daily with a meal.   5  . metoprolol succinate (TOPROL-XL) 50 MG 24 hr tablet TAKE ONE & ONE-HALF TABLETS BY MOUTH ONCE DAILY WITH  OR  IMMEDIATELY  FOLLOWING  A  MEAL 140 tablet 3  . Multiple Vitamin (MULTIVITAMIN) tablet Take 1 tablet by mouth daily.    Marland Kitchen omeprazole (PRILOSEC) 20 MG capsule Take 1 capsule by mouth daily.  2  . tamsulosin (FLOMAX) 0.4 MG CAPS capsule Take 0.4 mg by mouth.      No current facility-administered medications for this visit.     Past Medical History:  Diagnosis Date  . CAD (coronary artery disease)   . Cancer ([place])   . Diabetes ([place])   . Dyslipidemia   . Gout   . HTN (hypertension)     Past Surgical History:  Procedure Laterality Date  . BACK SURGERY    . left knee surgery    . left shoulder surgery    . right knee surgery      Social History   Social History  . Marital status: Married    Spouse name: N/A  . Number of children: N/A  . Years of education: N/A   Occupational History  . Not on file.   Social History Main Topics  . Smoking status: Former Smoker    Packs/day: 5.00    Years: 36.00    Types: Cigarettes    Start date: 11/24/1952    Quit date: 11/24/1988  . Smokeless tobacco: Current User    Types: Snuff  . Alcohol use No  . Drug use: No  . Sexual activity: Not on file   Other Topics Concern  . Not on file   Social History Narrative  . No narrative on file     Vitals:   11/08/16 0956  BP: 139/79  Pulse: 74  Weight: 235 lb (106.6 kg)  Height: 5' 8"  (1.727 m)  PHYSICAL EXAM General: NAD HEENT: Normal. Neck: No JVD, no thyromegaly. Lungs: Clear to auscultation bilaterally with normal respiratory effort. CV: Nondisplaced PMI.  Regular rate and rhythm, normal S1/S2, no S3/S4, no murmur. No pretibial or periankle edema.  No carotid bruit.   Abdomen: Obese. Neurologic: Alert and oriented.  Psych: Normal affect. Skin: Normal. Musculoskeletal: No gross deformities.    ECG: Most recent ECG reviewed.      ASSESSMENT AND PLAN: 1. CAD with prior PCI: Symptomatically stable. Continue ASA and metoprolol.  He has been intolerant of statin therapy.  2. Essential HTN: Reasonably controlled. No changes.  3. Dyslipidemia: He previously stopped Crestor due to side effects. He has been intolerant of statin therapy. Obtain copy of lipids from PCP.  4. Tobacco abuse: Will prescribe Chantix starter  kit.  Dispo: f/u 1 year.    Kate Sable, M.D., F.A.C.C.

## 2016-11-08 NOTE — Patient Instructions (Addendum)
Your physician wants you to follow-up in: [place] [facility] You will receive a reminder letter in the mail two months in advance. If you don't receive a letter, please call our office to schedule the follow-up appointment.  Your physician has recommended you make the following change in your medication:  START CHANTIX   TAKE 0.5 MG ONCE DAILY FOR 3 DAYS THEN  INCREASE 0.5 MG TWICE DAILY FOR 4 DAYS THEN   INCREASE 1 MG TWICE DAILY  Thank you for choosing [facility]!!

## 2016-11-08 NOTE — Addendum Note (Signed)
Addended by: Julian Hy T on: 11/08/2016 10:34 AM   Modules accepted: Orders

## 2016-11-08 NOTE — Addendum Note (Signed)
Addended by: Julian Hy T on: 11/08/2016 10:33 AM   Modules accepted: Orders

## 2016-11-17 ENCOUNTER — Emergency Department (HOSPITAL_COMMUNITY): Payer: Medicare HMO

## 2016-11-17 ENCOUNTER — Encounter (HOSPITAL_COMMUNITY): Payer: Self-pay | Admitting: Radiology

## 2016-11-17 ENCOUNTER — Emergency Department (HOSPITAL_COMMUNITY)
Admission: EM | Admit: 2016-11-17 | Discharge: 2016-11-17 | Disposition: A | Discharge status: Home / Self Care | Payer: Medicare HMO | Attending: Emergency Medicine | Admitting: Emergency Medicine

## 2016-11-17 DIAGNOSIS — I2510 Atherosclerotic heart disease of native coronary artery without angina pectoris: Secondary | ICD-10-CM | POA: Diagnosis not present

## 2016-11-17 DIAGNOSIS — Z7984 Long term (current) use of oral hypoglycemic drugs: Secondary | ICD-10-CM | POA: Diagnosis not present

## 2016-11-17 DIAGNOSIS — Z7982 Long term (current) use of aspirin: Secondary | ICD-10-CM | POA: Diagnosis not present

## 2016-11-17 DIAGNOSIS — Z859 Personal history of malignant neoplasm, unspecified: Secondary | ICD-10-CM | POA: Diagnosis not present

## 2016-11-17 DIAGNOSIS — Z87891 Personal history of nicotine dependence: Secondary | ICD-10-CM | POA: Diagnosis not present

## 2016-11-17 DIAGNOSIS — E119 Type 2 diabetes mellitus without complications: Secondary | ICD-10-CM | POA: Insufficient documentation

## 2016-11-17 DIAGNOSIS — R112 Nausea with vomiting, unspecified: Secondary | ICD-10-CM | POA: Diagnosis not present

## 2016-11-17 DIAGNOSIS — R55 Syncope and collapse: Secondary | ICD-10-CM | POA: Diagnosis present

## 2016-11-17 DIAGNOSIS — R42 Dizziness and giddiness: Secondary | ICD-10-CM

## 2016-11-17 DIAGNOSIS — I10 Essential (primary) hypertension: Secondary | ICD-10-CM | POA: Insufficient documentation

## 2016-11-17 DIAGNOSIS — R471 Dysarthria and anarthria: Secondary | ICD-10-CM | POA: Diagnosis not present

## 2016-11-17 DIAGNOSIS — Z79899 Other long term (current) drug therapy: Secondary | ICD-10-CM | POA: Insufficient documentation

## 2016-11-17 DIAGNOSIS — I639 Cerebral infarction, unspecified: Secondary | ICD-10-CM

## 2016-11-17 LAB — DIFFERENTIAL
Basophils Absolute: 0 10*3/uL (ref 0.0–0.1)
Basophils Relative: 0 %
Eosinophils Absolute: 0.1 10*3/uL (ref 0.0–0.7)
Eosinophils Relative: 1 %
Lymphocytes Relative: 52 %
Lymphs Abs: 7.7 10*3/uL — ABNORMAL HIGH (ref 0.7–4.0)
Monocytes Absolute: 0.6 10*3/uL (ref 0.1–1.0)
Monocytes Relative: 4 %
Neutro Abs: 6.3 10*3/uL (ref 1.7–7.7)
Neutrophils Relative %: 43 %

## 2016-11-17 LAB — RAPID URINE DRUG SCREEN, HOSP PERFORMED
Amphetamines: NOT DETECTED
Barbiturates: NOT DETECTED
Benzodiazepines: NOT DETECTED
Cocaine: NOT DETECTED
Opiates: NOT DETECTED
Tetrahydrocannabinol: NOT DETECTED

## 2016-11-17 LAB — I-STAT CHEM 8, ED
BUN: 17 mg/dL (ref 6–20)
Calcium, Ion: 1.11 mmol/L — ABNORMAL LOW (ref 1.15–1.40)
Chloride: 99 mmol/L — ABNORMAL LOW (ref 101–111)
Creatinine, Ser: 0.7 mg/dL (ref 0.61–1.24)
Glucose, Bld: 283 mg/dL — ABNORMAL HIGH (ref 65–99)
HCT: 48 % (ref 39.0–52.0)
Hemoglobin: 16.3 g/dL (ref 13.0–17.0)
Potassium: 3.9 mmol/L (ref 3.5–5.1)
Sodium: 135 mmol/L (ref 135–145)
TCO2: 22 mmol/L (ref 0–100)

## 2016-11-17 LAB — APTT: aPTT: 29 seconds (ref 24–36)

## 2016-11-17 LAB — CBC
HCT: 45.2 % (ref 39.0–52.0)
Hemoglobin: 15.6 g/dL (ref 13.0–17.0)
MCH: 29.2 pg (ref 26.0–34.0)
MCHC: 34.5 g/dL (ref 30.0–36.0)
MCV: 84.5 fL (ref 78.0–100.0)
Platelets: 287 10*3/uL (ref 150–400)
RBC: 5.35 MIL/uL (ref 4.22–5.81)
RDW: 13.1 % (ref 11.5–15.5)
WBC: 14.7 10*3/uL — ABNORMAL HIGH (ref 4.0–10.5)

## 2016-11-17 LAB — URINALYSIS, ROUTINE W REFLEX MICROSCOPIC
Bacteria, UA: NONE SEEN
Bilirubin Urine: NEGATIVE
Glucose, UA: >=500 mg/dL — AB
Ketones, ur: 20 mg/dL — AB
Leukocytes, UA: NEGATIVE
Nitrite: NEGATIVE
Protein, ur: 100 mg/dL — AB
Specific Gravity, Urine: 1.031 — ABNORMAL HIGH (ref 1.005–1.030)
pH: 5 (ref 5.0–8.0)

## 2016-11-17 LAB — CBG MONITORING, ED: Glucose-Capillary: 260 mg/dL — ABNORMAL HIGH (ref 65–99)

## 2016-11-17 LAB — PROTIME-INR
INR: 1.07
Prothrombin Time: 13.9 seconds (ref 11.4–15.2)

## 2016-11-17 LAB — COMPREHENSIVE METABOLIC PANEL
ALT: 23 U/L (ref 17–63)
AST: 47 U/L — ABNORMAL HIGH (ref 15–41)
Albumin: 4.3 g/dL (ref 3.5–5.0)
Alkaline Phosphatase: 48 U/L (ref 38–126)
Anion gap: 17 — ABNORMAL HIGH (ref 5–15)
BUN: 15 mg/dL (ref 6–20)
CO2: 19 mmol/L — ABNORMAL LOW (ref 22–32)
Calcium: 9.8 mg/dL (ref 8.9–10.3)
Chloride: 97 mmol/L — ABNORMAL LOW (ref 101–111)
Creatinine, Ser: 0.89 mg/dL (ref 0.61–1.24)
GFR calc Af Amer: >60 mL/min (ref >60)
GFR calc non Af Amer: >60 mL/min (ref >60)
Glucose, Bld: 281 mg/dL — ABNORMAL HIGH (ref 65–99)
Potassium: 4 mmol/L (ref 3.5–5.1)
Sodium: 133 mmol/L — ABNORMAL LOW (ref 135–145)
Total Bilirubin: 0.6 mg/dL (ref 0.3–1.2)
Total Protein: 7.8 g/dL (ref 6.5–8.1)

## 2016-11-17 LAB — ETHANOL: Alcohol, Ethyl (B): <5 mg/dL (ref <5)

## 2016-11-17 LAB — I-STAT TROPONIN, ED: Troponin i, poc: 0 ng/mL (ref 0.00–0.08)

## 2016-11-17 MED ORDER — LORAZEPAM 1 MG PO TABS
0.5000 mg | ORAL_TABLET | Freq: Once | ORAL | Status: AC
  Administered 2016-11-17: 0.5 mg via ORAL
  Filled 2016-11-17: qty 1

## 2016-11-17 MED ORDER — SODIUM CHLORIDE 0.9 % IV BOLUS (SEPSIS)
1000.0000 mL | Freq: Once | INTRAVENOUS | Status: AC
  Administered 2016-11-17: 1000 mL via INTRAVENOUS

## 2016-11-17 MED ORDER — ONDANSETRON HCL 4 MG/2ML IJ SOLN
4.0000 mg | Freq: Once | INTRAMUSCULAR | Status: AC
  Administered 2016-11-17: 4 mg via INTRAVENOUS
  Filled 2016-11-17: qty 2

## 2016-11-17 MED ORDER — LABETALOL HCL 5 MG/ML IV SOLN
INTRAVENOUS | Status: AC
  Filled 2016-11-17: qty 4

## 2016-11-17 MED ORDER — IOPAMIDOL (ISOVUE-370) INJECTION 76%
INTRAVENOUS | Status: AC
  Administered 2016-11-17: 50 mL
  Filled 2016-11-17: qty 50

## 2016-11-17 MED ORDER — PSEUDOEPHEDRINE HCL 30 MG PO TABS
30.0000 mg | ORAL_TABLET | Freq: Once | ORAL | Status: AC
  Administered 2016-11-17: 30 mg via ORAL
  Filled 2016-11-17: qty 1

## 2016-11-17 MED ORDER — MECLIZINE HCL 25 MG PO TABS
25.0000 mg | ORAL_TABLET | Freq: Once | ORAL | Status: AC
  Administered 2016-11-17: 25 mg via ORAL
  Filled 2016-11-17: qty 1

## 2016-11-17 MED ORDER — MECLIZINE HCL 12.5 MG PO TABS: 12.5000 mg | ORAL_TABLET | Freq: Three times a day (TID) | ORAL | 0 refills | Status: DC | PRN

## 2016-11-17 NOTE — ED Notes (Signed)
Patient transported to MRI 

## 2016-11-17 NOTE — ED Provider Notes (Signed)
Signed out by Dr Rex Kras to d/c to home if/when MRI neg for cva.  MRI neg acute.  Pt alert, content, no apparent distress.     Lajean Saver, MD 11/17/16 [phone]

## 2016-11-17 NOTE — ED Notes (Signed)
To B19 from CT. Patient nauseated and vomited in hallway in route. States he has been feeling nauseated all morning. Has a sinus infection and conjunctivitis that he got medication for on Friday.

## 2016-11-17 NOTE — ED Provider Notes (Signed)
[facility] DEPT Provider Note   CSN: [id number] Arrival date & time: 11/17/16 1205     History   No chief complaint on file.    HPI [name] [name]. is a 71 y.o. male.  71yo M w/ PMH including CAD, HTN, HLD, gout who p/w loss of consciousness, vomiting, and weakness. History obtained by EMS. Patient states that he was going to [employer] to eat. He was normal when he left home. At 11am, he had a sudden onset of nausea and generalized weakness. EMS reports nausea/vomiting, confusion, and generalized weakness during transport for which they called a code stroke.  LEVEL 5 CAVEAT DUE TO AMS   Past Medical History:  Diagnosis Date  . CAD (coronary artery disease)   . Cancer ([place])   . Diabetes ([place])   . Dyslipidemia   . Gout   . HTN (hypertension)      Patient Active Problem List   Diagnosis Date Noted  . SOB (shortness of breath) on exertion 07/22/2013  . CAD S/P percutaneous coronary angioplasty 07/09/2013  . HTN (hypertension) 07/09/2013  . Hyperlipidemia 07/09/2013  . Chest pain 07/09/2013    Past Surgical History:  Procedure Laterality Date  . BACK SURGERY    . left knee surgery    . left shoulder surgery    . right knee surgery          Home Medications    Prior to Admission medications   Medication Sig Start Date End Date Taking? Authorizing Provider  allopurinol (ZYLOPRIM) 300 MG tablet Take 1 tablet by mouth daily as needed. 09/10/16   Historical Provider, MD  aspirin 81 MG chewable tablet Chew 81 mg by mouth daily.    Historical Provider, MD  Chromium-Cinnamon (CINNAMON PLUS CHROMIUM) [phone] MCG-MG CAPS Take 2 capsules by mouth daily.    Historical Provider, MD  COLCRYS 0.6 MG tablet Take 1 tablet by mouth daily as needed. 10/02/16   Historical Provider, MD  fenofibrate (TRICOR) 145 MG tablet Take 145 mg by mouth every morning.    Historical Provider, MD  lisinopril (PRINIVIL,ZESTRIL) 20 MG tablet Take 1 tablet by mouth daily. 10/02/16    Historical Provider, MD  metFORMIN (GLUCOPHAGE) 1000 MG tablet 1,000 mg 2 (two) times daily with a meal.  10/02/15   Historical Provider, MD  metoprolol succinate (TOPROL-XL) 50 MG 24 hr tablet TAKE ONE & ONE-HALF TABLETS BY MOUTH ONCE DAILY WITH  OR  IMMEDIATELY  FOLLOWING  A  MEAL 02/15/16   Herminio Commons, MD  Multiple Vitamin (MULTIVITAMIN) tablet Take 1 tablet by mouth daily.    Historical Provider, MD  omeprazole (PRILOSEC) 20 MG capsule Take 1 capsule by mouth daily. 10/02/16   Historical Provider, MD  tamsulosin (FLOMAX) 0.4 MG CAPS capsule Take 0.4 mg by mouth. 01/17/15   Historical Provider, MD  varenicline (CHANTIX STARTING MONTH PAK) 0.5 MG X 11 & 1 MG X 42 tablet Take one 0.5 mg tablet by mouth once daily for 3 days, then increase to one 0.5 mg tablet twice daily for 4 days, then increase to one 1 mg tablet twice daily. 11/08/16   Herminio Commons, MD      Family History  Problem Relation Age of Onset  . Heart attack Father 34  . Colon cancer Brother 85     Social History  Substance Use Topics  . Smoking status: Former Smoker    Packs/day: 5.00    Years: 36.00    Types: Cigarettes  Start date: 11/24/1952    Quit date: 11/24/1988  . Smokeless tobacco: Current User    Types: Snuff  . Alcohol use No     Allergies     Simvastatin    Review of Systems Unable to obtain ROS secondary to altered mental status  Physical Exam Updated Vital Signs There were no vitals taken for this visit.  Physical Exam  Constitutional: He appears well-developed and well-nourished. No distress.  Awake, alert  HENT:  Head: Normocephalic and atraumatic.  Eyes: Conjunctivae and EOM are normal. Pupils are equal, round, and reactive to light. Left eye exhibits discharge.  Scar R lower eyelid  Neck: Neck supple.  Cardiovascular: Normal rate, regular rhythm and normal heart sounds.   No murmur heard. Pulmonary/Chest: Effort normal and breath sounds normal. No respiratory  distress.  Abdominal: Soft. Bowel sounds are normal. He exhibits no distension. There is no tenderness.  Musculoskeletal: He exhibits no edema.  Neurological: He is alert. He has normal reflexes. No cranial nerve deficit. He exhibits normal muscle tone.  Slowed response time but able to follow commands, normal finger-to-nose testing with effort,  4+/5 strength and normal sensation x all 4 extremities   Skin: Skin is warm and dry.  Psychiatric: He has a normal mood and affect. Judgment and thought content normal.  Nursing note and vitals reviewed.     ED Treatments / Results  Labs (all labs ordered are listed, but only abnormal results are displayed) Labs Reviewed  PROTIME-INR  APTT  CBC  DIFFERENTIAL  COMPREHENSIVE METABOLIC PANEL  I-STAT [place], ED  CBG MONITORING, ED  I-STAT CHEM 8, ED     EKG  EKG Interpretation  Date/Time:    Ventricular Rate:    PR Interval:    QRS Duration:   QT Interval:    QTC Calculation:   R Axis:     Text Interpretation:           Radiology No results found.  Procedures Procedures (including critical care time) Procedures  Medications Ordered in ED  Medications  iopamidol (ISOVUE-370) 76 % injection (50 mLs  Contrast Given 11/17/16 1218)  sodium chloride 0.9 % bolus 1,000 mL (0 mLs Intravenous Stopped 11/17/16 1400)  ondansetron (ZOFRAN) injection 4 mg (4 mg Intravenous Given 11/17/16 1300)  ondansetron (ZOFRAN) injection 4 mg (4 mg Intravenous Given 11/17/16 1459)  meclizine (ANTIVERT) tablet 25 mg (25 mg Oral Given 11/17/16 1459)  pseudoephedrine (SUDAFED) tablet 30 mg (30 mg Oral Given 11/17/16 1748)  LORazepam (ATIVAN) tablet 0.5 mg (0.5 mg Oral Given 11/17/16 1741)     Initial Impression / Assessment and Plan / ED Course  I have reviewed the triage vital signs and the nursing notes.  Pertinent labs & imaging results that were available during my care of the patient were reviewed by me and considered in my medical decision  making (see chart for details).  Clinical Course    Pt brought in by EMS as stroke alert after he began vomiting and was found confused w/ weakness. On arrival, airway intact, no obvious unilateral weakness, pt slow to respond to questions but able to talk. Met by neurologist, Dr. Leonel Ramsay, and taken to CT scanner where CT head was negative acute. Dr. Leonel Ramsay recommended CTA, which was also negative for acute finding to explain his symptoms. Pt denies alcohol or other substance use.  Obtained above labs which showed hyperglycemia without DKA, WBC 14.7, the remainder of labs unremarkable. CXR negative acute. On re-examination, pt complaining of dizziness  which he describes as room spinning when he opens his eyes. Had given zofran, gave meclizine to treat vertigo. Discussed w/ Dr. Leonel Ramsay, who recommended MRI to r/o acute stroke. I am signing out to the oncoming provider, who will f/u on imaging. Per Dr. Leonel Ramsay, pt ok for discharge if MRI negative and dizziness improved.   Final Clinical Impressions(s) / ED Diagnoses   Final diagnoses:  Dizziness  Nausea and vomiting in adult     New Prescriptions   No medications on file       Sharlett Iles, MD 11/20/16 [phone]

## 2016-11-17 NOTE — Consult Note (Signed)
Requesting Physician: Dr. Rex Kras    Chief Complaint: Code stroke  History obtained from:  Patient    HPI:                                                                                                                                         [name] [name]. is an 71 y.o. male with history of CAD, cancer, diabetes, dyslipidemia, gout and hypertension. 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 On arrival due to patient's generalized weakness patient was brought in as a code stroke. Initial CT did not show any acute intracranial hemorrhage. For fear that there may be a basilar occlusion or posterior circulation occlusion a CTA was obtained. We'll patient was wheeled to his room patient was having multiple episodes of dry heaving. Vision had no actual emesis. Patient was a very poor historian and could not tell me much history. He denies eating any food that might made him sick. He denies drinking any alcohol for the last multiple years. He does say that his wife has been sick but cannot give any details about this. He does have significant amount of periocular conjunctivitis in his left eye greater than his right eye twitch she states he has recently had a prescription written for him again he cannot give me any history about this.  It was noted that patient had emesis prior to having a syncopal episode.  Date last known well: Date: 11/17/2016 Time last known well: Time: 11:00 tPA Given: no to mild to treat NIH stroke scale of 1  Past Medical History:  Diagnosis Date  . CAD (coronary artery disease)   . Cancer ([place])   . Diabetes ([place])   . Dyslipidemia   . Gout   . HTN (hypertension)     Past Surgical History:  Procedure Laterality Date  . BACK SURGERY    . left knee surgery    . left shoulder surgery    . right knee surgery      Family History  Problem  Relation Age of Onset  . Heart attack Father 70  . Colon cancer Brother 59   Social History:  reports that he quit smoking about 28 years ago. His smoking use included Cigarettes. He started smoking about 64 years ago. He has a 180.00 pack-year smoking history. His smokeless tobacco use includes Snuff. He reports that he does not drink alcohol or use drugs.  Allergies:  Allergies  Allergen Reactions  . Simvastatin     Other reaction(s): Dizziness    Medications:  Current Facility-Administered Medications  Medication Dose Route Frequency Provider Last Rate Last Dose  . labetalol (NORMODYNE,TRANDATE) 5 MG/ML injection           . ondansetron (ZOFRAN) injection 4 mg  4 mg Intravenous Once Wenda Overland Little, MD      . sodium chloride 0.9 % bolus 1,000 mL  1,000 mL Intravenous Once Sharlett Iles, MD       Current Outpatient Prescriptions  Medication Sig Dispense Refill  . allopurinol (ZYLOPRIM) 300 MG tablet Take 1 tablet by mouth daily as needed.  6  . aspirin 81 MG chewable tablet Chew 81 mg by mouth daily.    . Chromium-Cinnamon (CINNAMON PLUS CHROMIUM) [phone] MCG-MG CAPS Take 2 capsules by mouth daily.    Marland Kitchen COLCRYS 0.6 MG tablet Take 1 tablet by mouth daily as needed.  2  . fenofibrate (TRICOR) 145 MG tablet Take 145 mg by mouth every morning.    Marland Kitchen lisinopril (PRINIVIL,ZESTRIL) 20 MG tablet Take 1 tablet by mouth daily.  1  . metFORMIN (GLUCOPHAGE) 1000 MG tablet 1,000 mg 2 (two) times daily with a meal.   5  . metoprolol succinate (TOPROL-XL) 50 MG 24 hr tablet TAKE ONE & ONE-HALF TABLETS BY MOUTH ONCE DAILY WITH  OR  IMMEDIATELY  FOLLOWING  A  MEAL 140 tablet 3  . Multiple Vitamin (MULTIVITAMIN) tablet Take 1 tablet by mouth daily.    Marland Kitchen omeprazole (PRILOSEC) 20 MG capsule Take 1 capsule by mouth daily.  2  . tamsulosin (FLOMAX) 0.4 MG CAPS capsule Take  0.4 mg by mouth.    . varenicline (CHANTIX STARTING MONTH PAK) 0.5 MG X 11 & 1 MG X 42 tablet Take one 0.5 mg tablet by mouth once daily for 3 days, then increase to one 0.5 mg tablet twice daily for 4 days, then increase to one 1 mg tablet twice daily. 53 tablet 0     ROS:                                                                                                                                       History obtained from the patient  General ROS: negative for - chills, fatigue, fever, night sweats, weight gain or weight loss Psychological ROS: negative for - behavioral disorder, hallucinations, memory difficulties, mood swings or suicidal ideation Ophthalmic ROS: negative for - blurry vision, double vision, eye pain or loss of vision ENT ROS: negative for - epistaxis, nasal discharge, oral lesions, sore throat, tinnitus or vertigo Allergy and Immunology ROS: negative for - hives or itchy/watery eyes Hematological and Lymphatic ROS: negative for - bleeding problems, bruising or swollen lymph nodes Endocrine ROS: negative for - galactorrhea, hair pattern changes, polydipsia/polyuria or temperature intolerance Respiratory ROS: negative for - cough, hemoptysis, shortness of breath or wheezing Cardiovascular ROS: negative for - chest pain, dyspnea on exertion, edema or irregular heartbeat Gastrointestinal ROS: negative for -  abdominal pain, diarrhea, hematemesis, nausea/vomiting or stool incontinence Genito-Urinary ROS: negative for - dysuria, hematuria, incontinence or urinary frequency/urgency Musculoskeletal ROS: negative for - joint swelling or muscular weakness Neurological ROS: as noted in HPI Dermatological ROS: negative for rash and skin lesion changes--he is diaphoretic  Neurologic Examination:                                                                                                      SpO2 99 %.  HEENT-  Normocephalic, no lesions, without obvious abnormality.  Normal  external eye and conjunctiva.  Normal TM's bilaterally.  Normal auditory canals and external ears. Normal external nose, mucus membranes and septum.  Normal pharynx. Cardiovascular- S1, S2 normal, pulses palpable throughout   Lungs- chest clear, no wheezing, rales, normal symmetric air entry Abdomen- normal findings: bowel sounds normal Extremities- no edema Lymph-no adenopathy palpable Musculoskeletal-no joint tenderness, deformity or swelling Skin-warm and diaphoretic  Neurological Examination Mental Status: Alert, oriented, thought content appropriate.  Speech fluent without evidence of aphasia however he speaks in a hypophonic voice.  Able to follow 3 step commands with inconsistency. Cranial Nerves: II:  Visual fields grossly normal,  III,IV, VI: ptosis not present, extra-ocular motions intact bilaterally, pupils equal, round, reactive to light and accommodation--he does have hypermetria of his right eye however this is secondary to previous surgery that he has had. V,VII: smile symmetric, facial light touch sensation normal bilaterally VIII: hearing normal bilaterally IX,X: uvula rises symmetrically XI: bilateral shoulder shrug XII: midline tongue extension Motor: He is able to move all his extremities antigravity however again the majority of the muscular exam is inconsistent with giveaway weakness. Sensory: Pinprick and light touch intact throughout, bilaterally Deep Tendon Reflexes: 2+ and symmetric throughout Plantars: Right: downgoing   Left: downgoing Cerebellar: normal finger-to-nose, cannot get him to participate for HKS Gait: Not tested       Lab Results: Basic Metabolic Panel:  Recent Labs Lab 11/17/16 1207 11/17/16 1214  NA 133* 135  K 4.0 3.9  CL 97* 99*  CO2 19*  --   GLUCOSE 281* 283*  BUN 15 17  CREATININE 0.89 0.70  CALCIUM 9.8  --     Liver Function Tests:  Recent Labs Lab 11/17/16 1207  AST 47*  ALT 23  ALKPHOS 48  BILITOT 0.6  PROT  7.8  ALBUMIN 4.3   No results for input(s): LIPASE, AMYLASE in the last 168 hours. No results for input(s): AMMONIA in the last 168 hours.  CBC:  Recent Labs Lab 11/17/16 1207 11/17/16 1214  WBC 14.7*  --   NEUTROABS PENDING  --   HGB 15.6 16.3  HCT 45.2 48.0  MCV 84.5  --   PLT 287  --     Cardiac Enzymes: No results for input(s): CKTOTAL, CKMB, CKMBINDEX, TROPONINI in the last 168 hours.  Lipid Panel: No results for input(s): CHOL, TRIG, HDL, CHOLHDL, VLDL, LDLCALC in the last 168 hours.  CBG:  Recent Labs Lab 11/17/16 [place]    Microbiology: No results found for this or any previous  visit.  Coagulation Studies: No results for input(s): LABPROT, INR in the last 72 hours.  Imaging: CT head reviewed by Dr. Leonel Ramsay, negative acute.     Assessment and plan discussed with with attending physician and they are in agreement.    Etta Quill PA-C [facility] [phone]  11/17/2016, 12:41 PM   An seen the patient and examined him, I have reviewed the above note. On my exam, he has a markedly inconsistent exam, with pronounced give way weakness in all 4 extremities, but with encouragement or distraction he does hold them against gravity with apparent good strength. He does not have any clear ataxia on finger nose finger on either side.  Assessment: 71 y.o. male presenting to [facility] ER secondary to syncopal episode post emesis. Patient was not TPA candidate secondary to  Mild symptoms.  My suspicion is that this represents syncope or presyncope due to nausea and vomiting, but it is possible that he does have a small cerebellar stroke, especially given that he has such an elevated blood pressure.  Stroke Risk Factors - diabetes mellitus, hyperlipidemia and hypertension  1) MRI brain 2) stroke workup only if positive  Roland Rack, MD [facility] [phone]  If 7pm- 7am, please page neurology on call  as listed in [place].

## 2016-11-17 NOTE — ED Triage Notes (Signed)
1100 felt hot, had LOC, general weakness. Called EMS. Dysarthria present on arrival. Denies pain or headache. HTN in route 220's/100's.

## 2016-11-17 NOTE — Discharge Instructions (Signed)
It was our pleasure to provide your ER care today - we hope that you feel better.  Rest. Drink plenty of fluids.  Take antivert as need for dizziness - no driving when taking, or any time when feeling dizzy.  Follow up with primary care doctor in the next few days if symptoms fail to improve/resolve.  Return to ER if worse, new symptoms, change in speech or vision, numbness/weakness, fevers, fainting, other concern.

## 2016-11-18 LAB — PATHOLOGIST SMEAR REVIEW

## 2017-03-18 IMAGING — DX DG SHOULDER 2+V*R*
3 series · 3 of 3 positions shown · non-contrast
Comparison: None.

CLINICAL DATA: Pain while pulling heavy object

EXAM:
RIGHT SHOULDER - 2+ VIEW

[shoulder grashey]
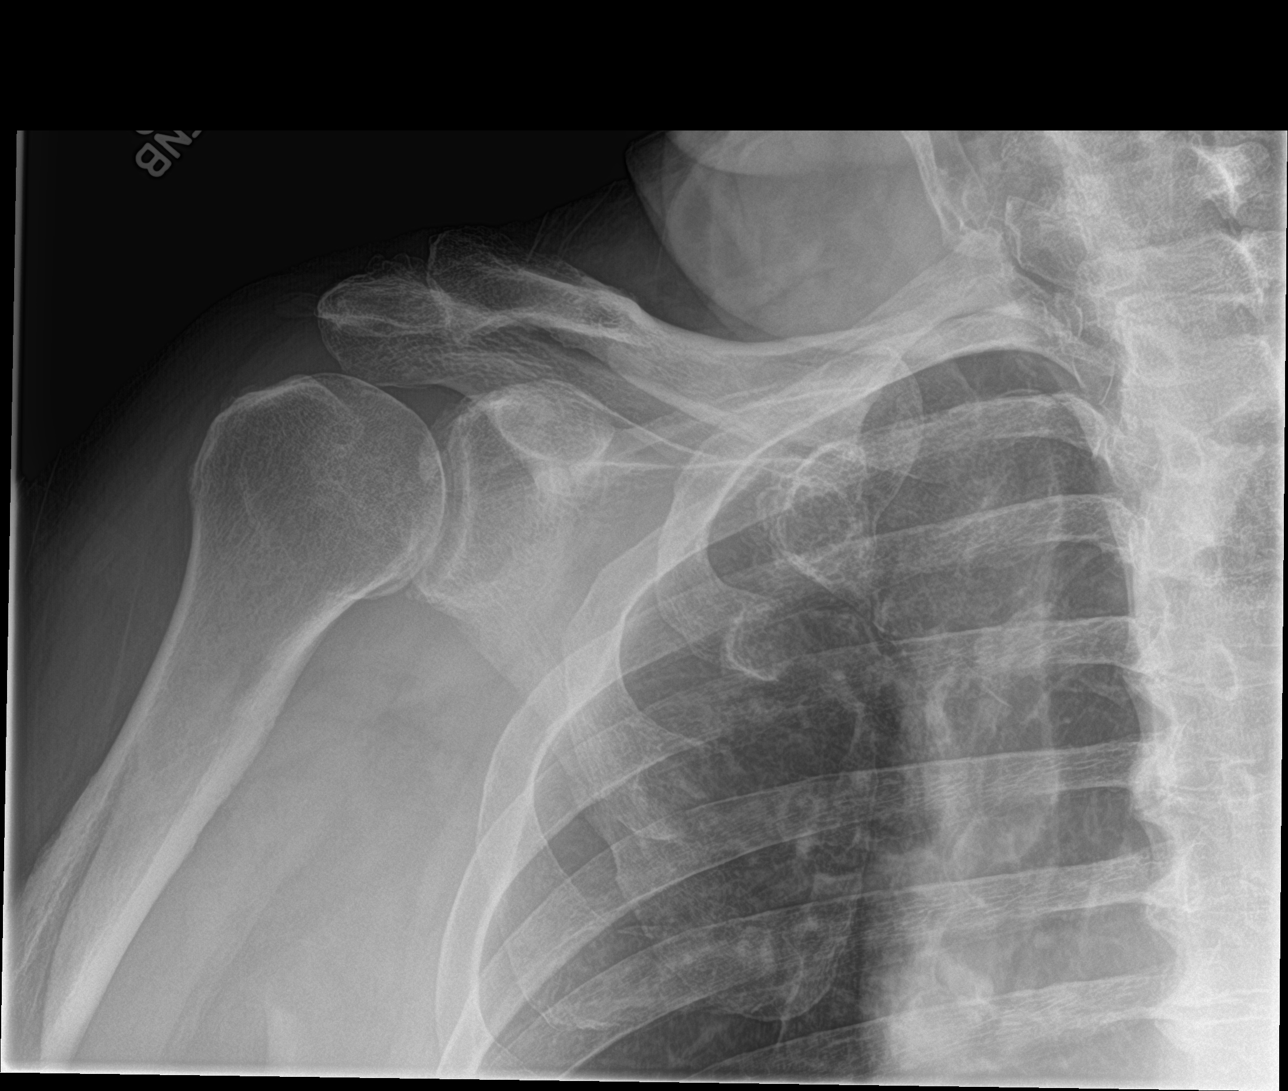

[shoulder y view]
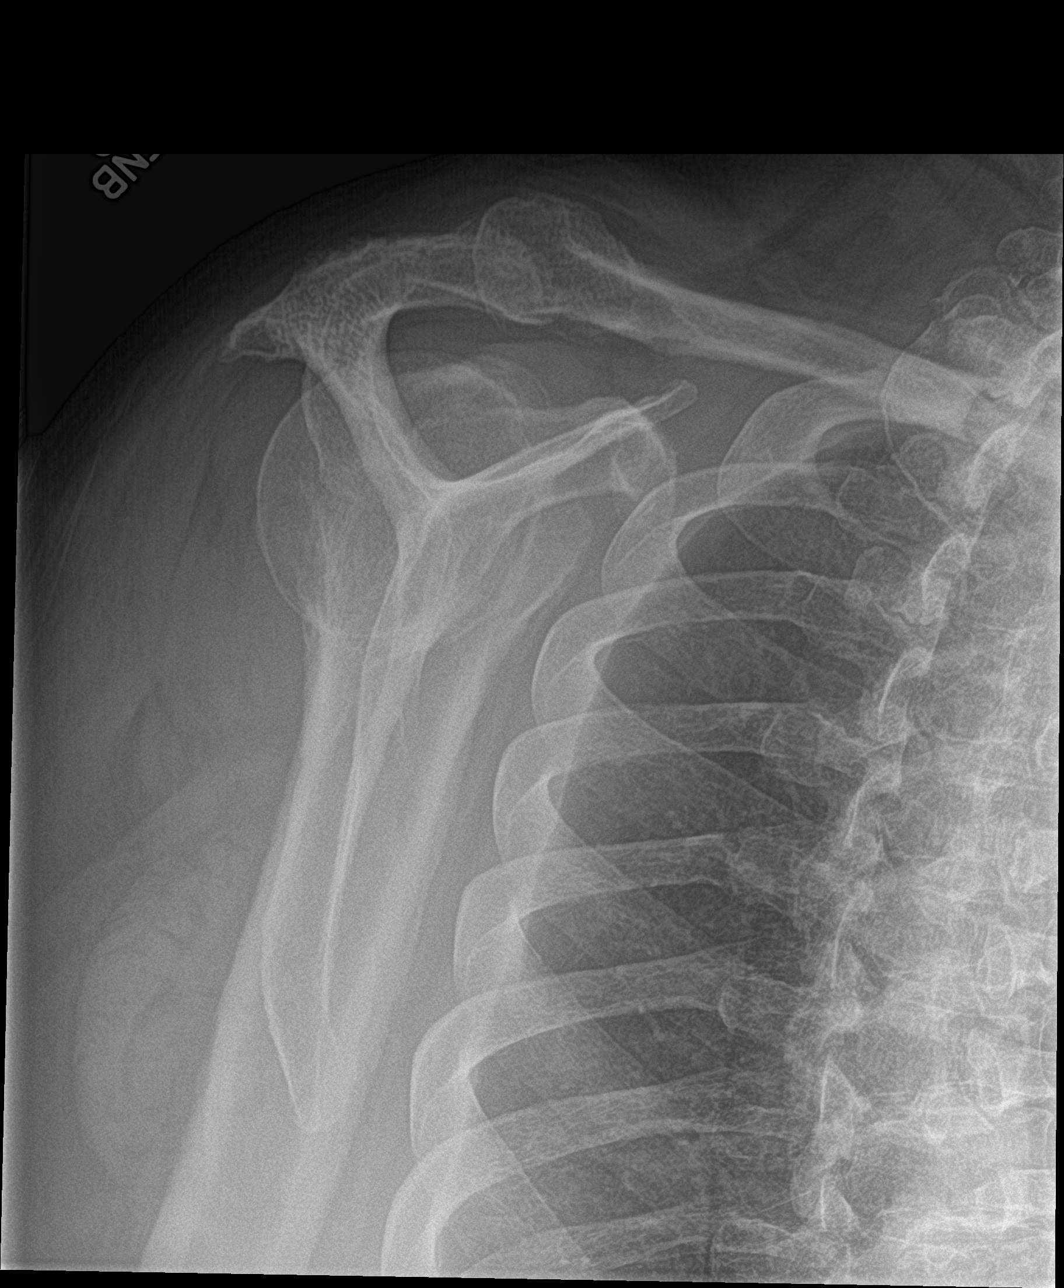

[shoulder axillary]
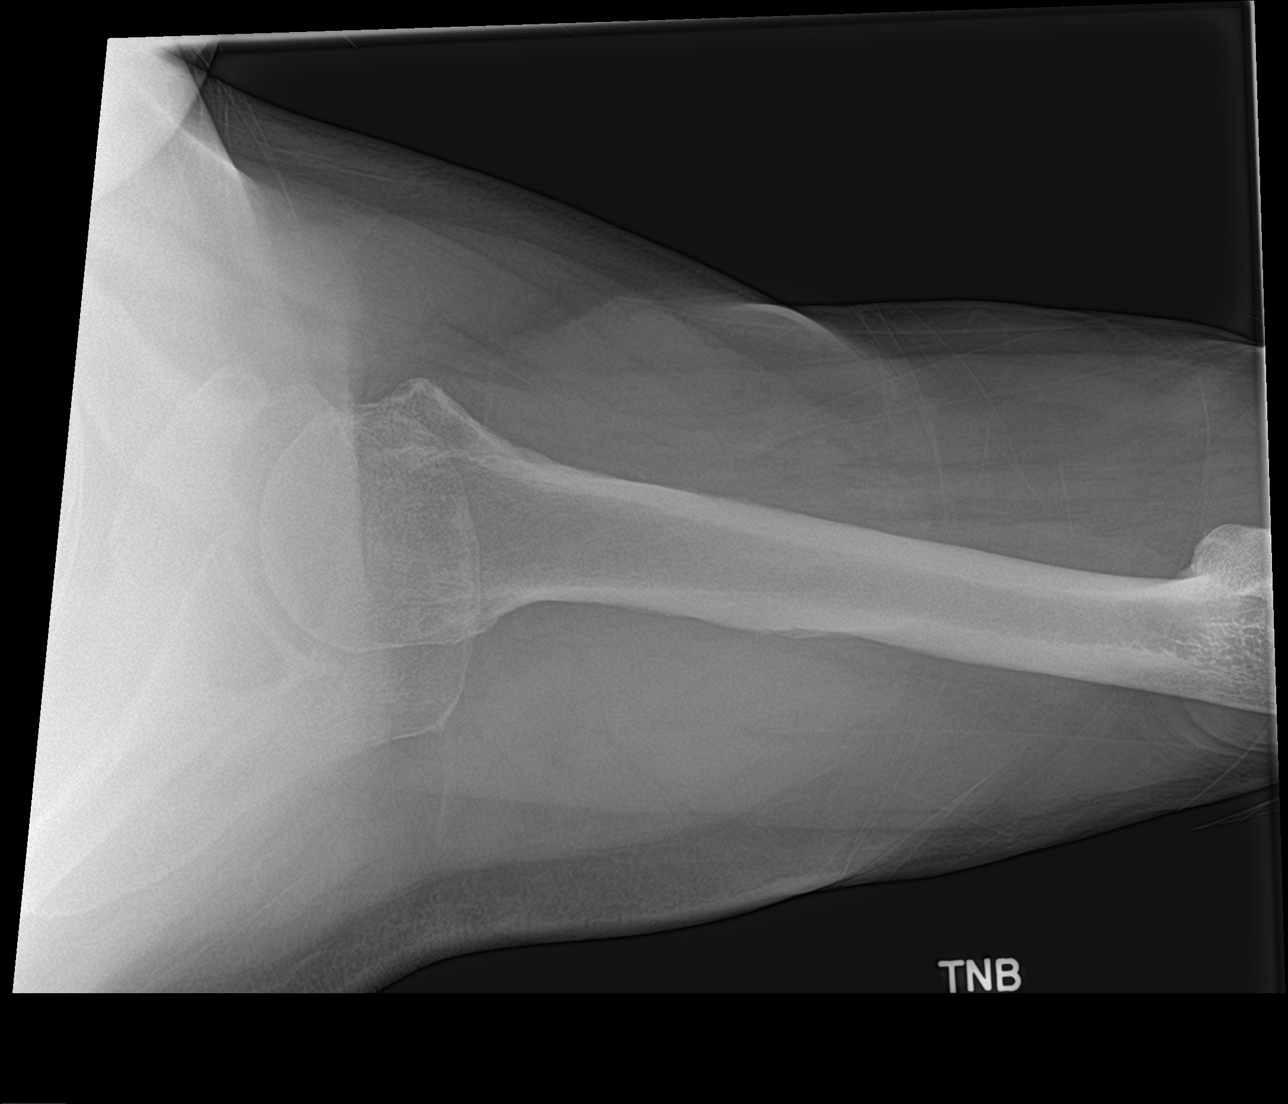

[3 of 3 positions shown; findings below may reference images not displayed]

FINDINGS: Frontal, Y scapular, and axillary images were obtained. There is no
demonstrable fracture or dislocation. There is moderate generalized
osteoarthritic change. There is bony overgrowth of the
acromioclavicular joint. No erosive change or intra-articular
calcification. Visualized right lung clear.
IMPRESSION: Moderate generalized osteoarthritic change. Bony overgrowth is noted
at the acromioclavicular joint. Bony overgrowth of this nature may
result in so-called impingement syndrome. If there are symptoms
suggesting impingement syndrome, MR would be the imaging study of
choice for further assessment. No fracture or dislocation.

## 2017-03-18 IMAGING — DX DG FOOT 2V*R*
2 series · 2 of 2 positions shown · non-contrast
Comparison: None.

CLINICAL DATA: Pain.  No history of recent injury

EXAM:
RIGHT FOOT - 2 VIEW

[foot ap]
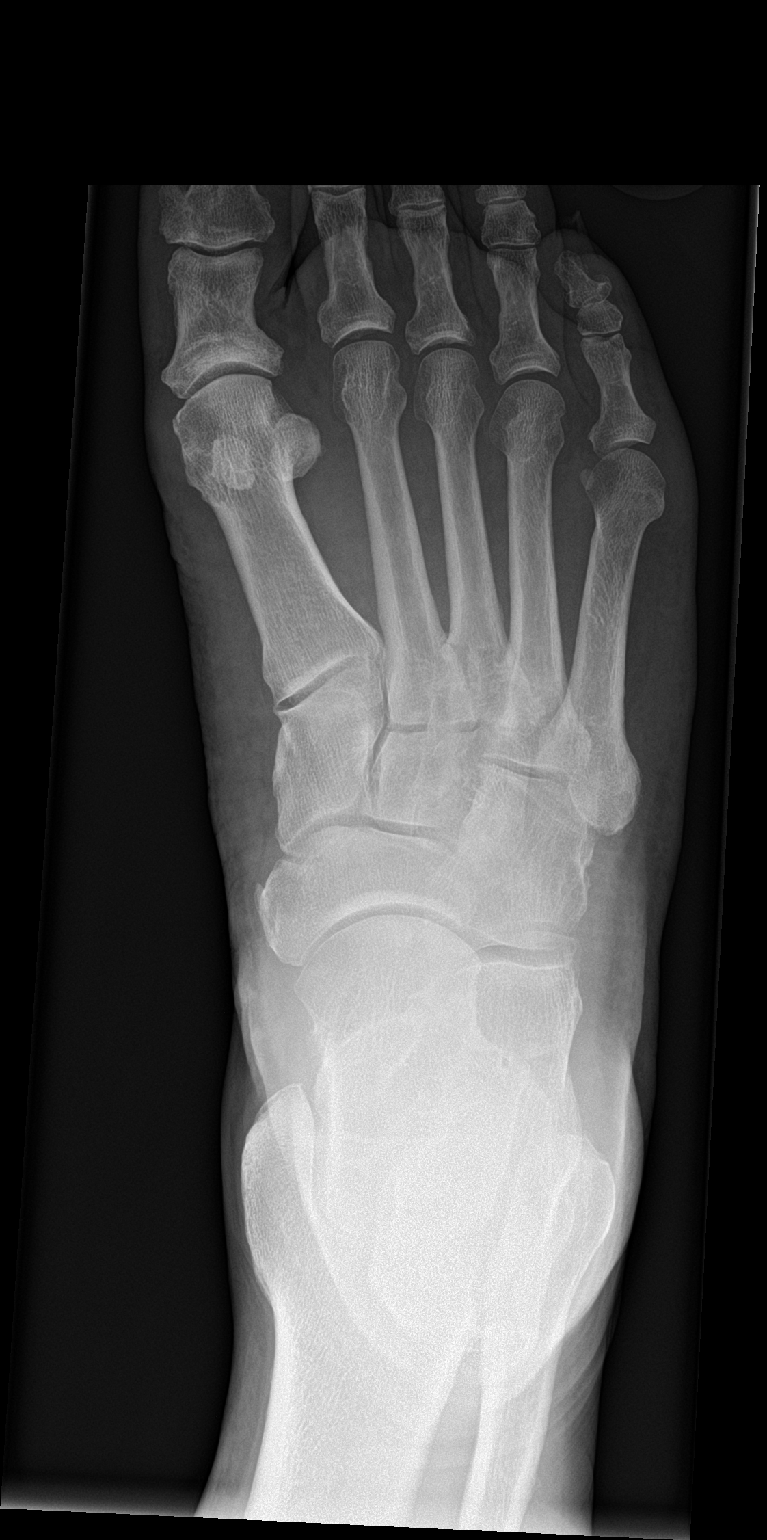

[foot lat]
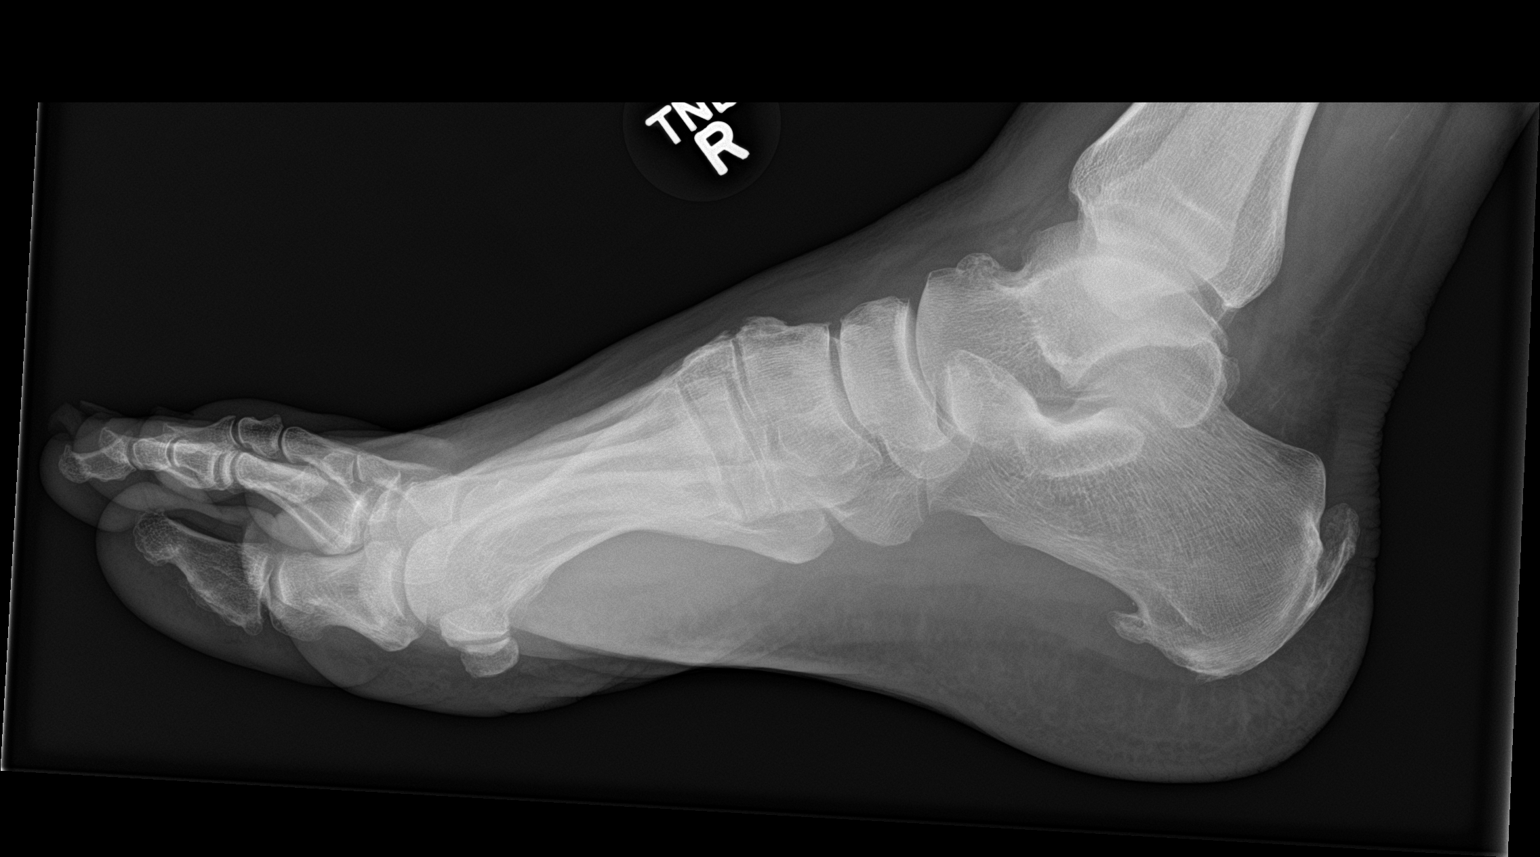

[2 of 2 positions shown; findings below may reference images not displayed]

FINDINGS: Frontal and lateral views were obtained. There is no fracture or
dislocation. There is moderate spurring in the dorsal midfoot
region. There are spurs arising from the posterior inferior
calcaneus. There is a spur along the medial navicular. Joint spaces
overall appear unremarkable. No erosive change.
IMPRESSION: Areas of osteoarthritic change in the dorsal midfoot. There are
calcaneal spurs as well as a small spur arising from the medial
navicular bone. No fracture or dislocation. No erosive change.

## 2017-05-03 IMAGING — MR MR SHOULDER*R* W/O CM
4 of 6 series · 19 of 40 positions shown · non-contrast
Comparison: None.

CLINICAL DATA: Right shoulder pain following injury 1 year ago.

EXAM:
MRI OF THE RIGHT SHOULDER WITHOUT CONTRAST
TECHNIQUE: Multiplanar, multisequence MR imaging of the shoulder was performed.
No intravenous contrast was administered.

[Series 3: t2fs axial · axial · 3.0mm · 0.31mm/px · z∈[-20,+38]mm · 3 of 26 slices shown]
[im 4/26]
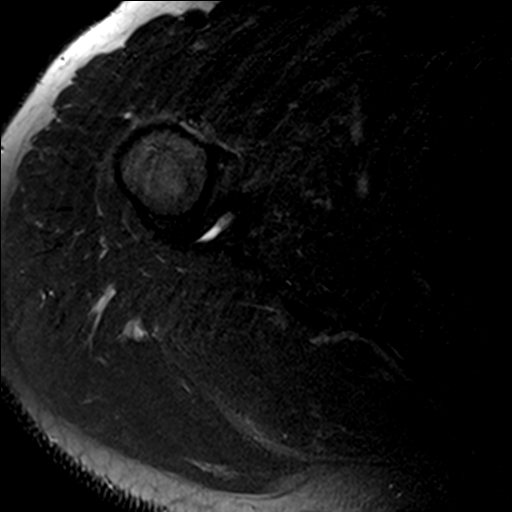
[im 15/26]
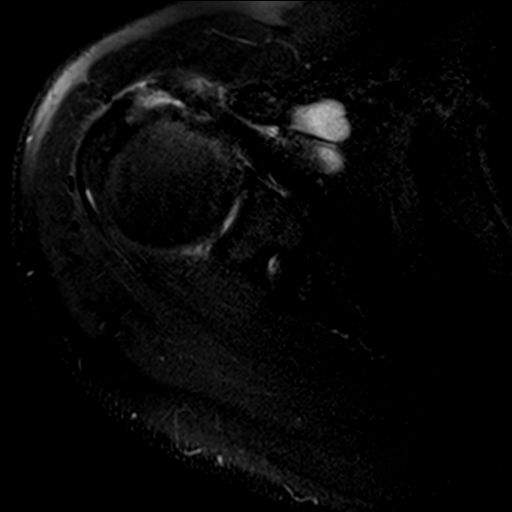
[im 22/26]
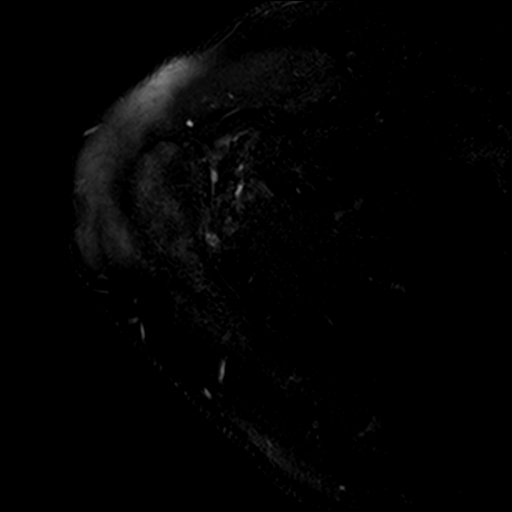

[Series 5: t2fs coronal · oblique · 3.0mm · 0.31mm/px · 3 of 24 slices shown]
[im 5/24]
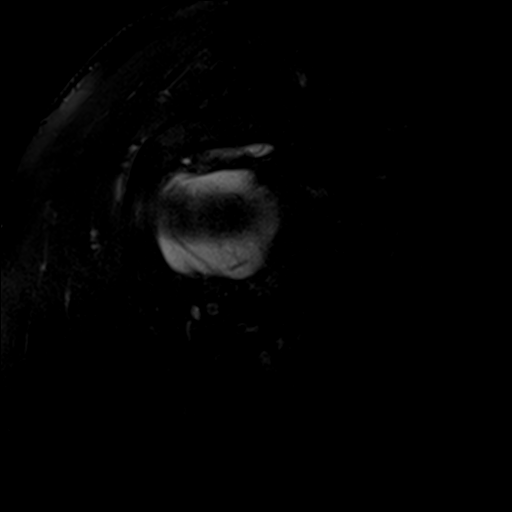
[im 14/24]
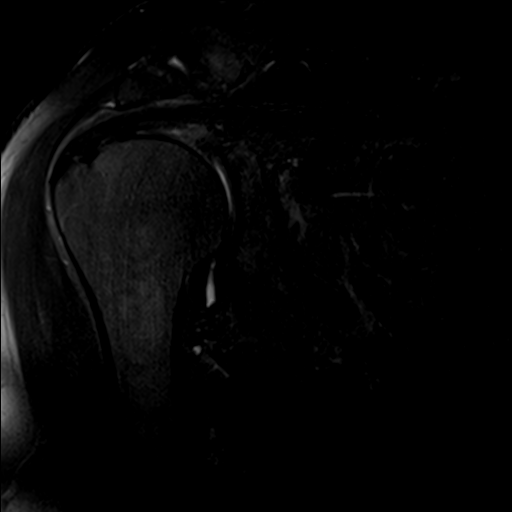
[im 24/24]
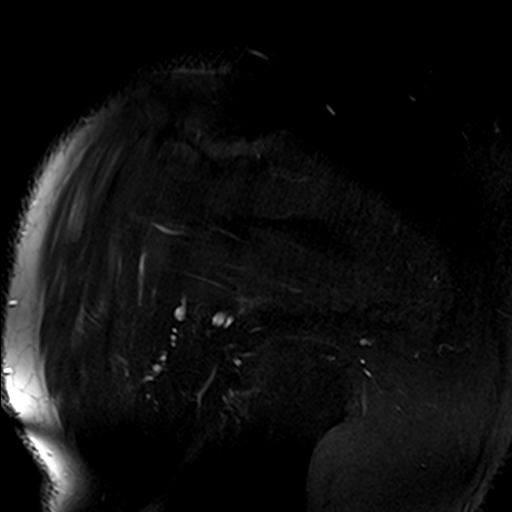

[Series 6: PD · oblique · 3.0mm · 0.31mm/px · 6 of 24 slices shown]
[im 1/24]
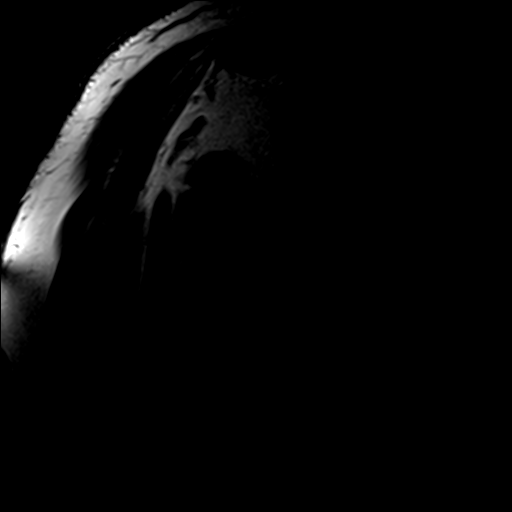
[im 5/24]
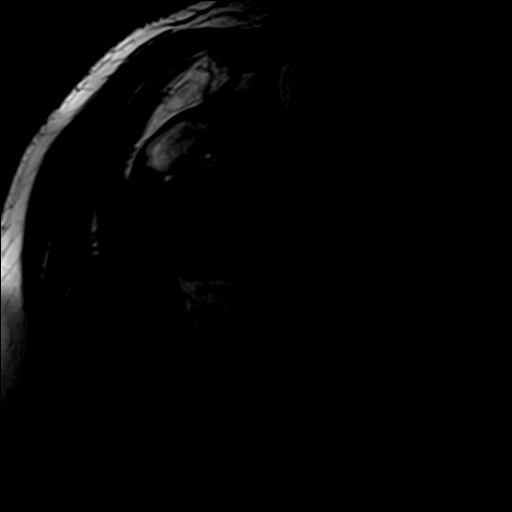
[im 10/24]
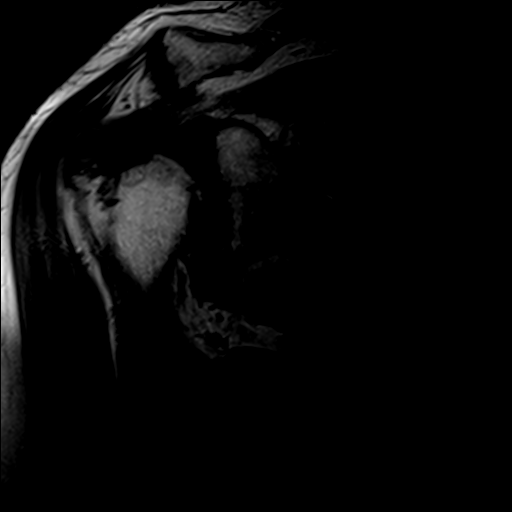
[im 14/24]
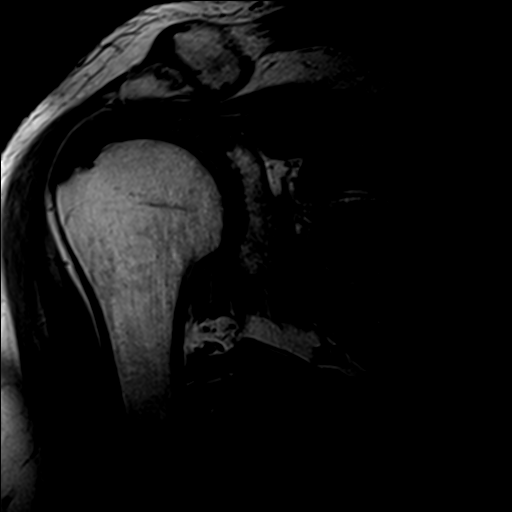
[im 19/24]
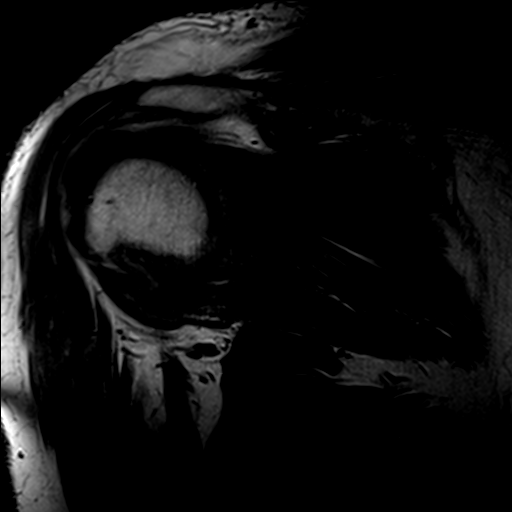
[im 24/24]
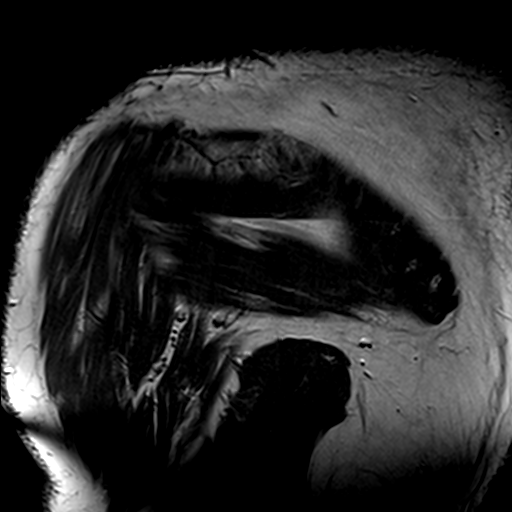

[Series 7: T1 · oblique · 3.0mm · 0.31mm/px · 7 of 28 slices shown]
[im 1/28]
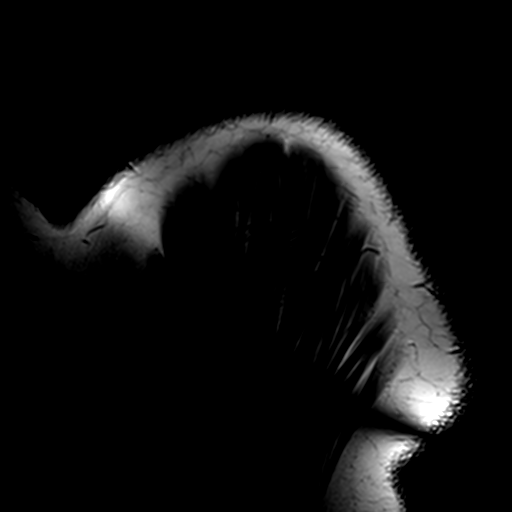
[im 5/28]
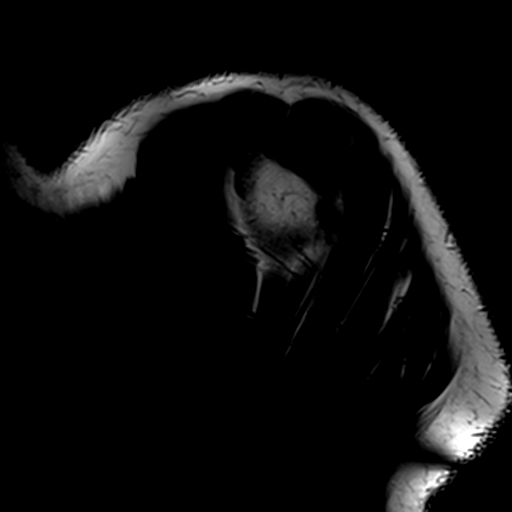
[im 10/28]
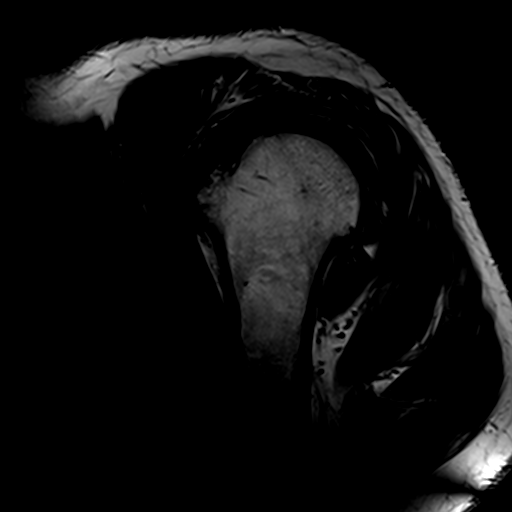
[im 14/28]
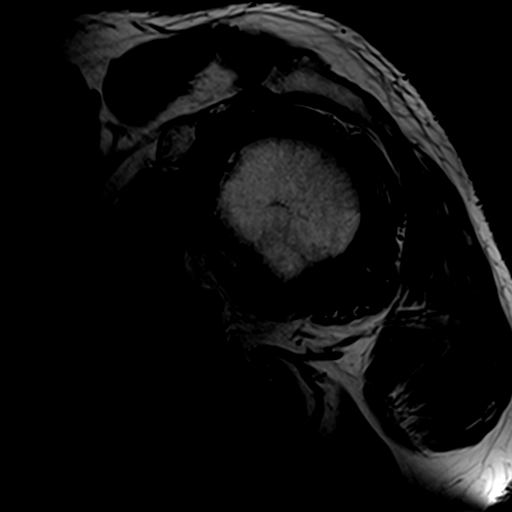
[im 19/28]
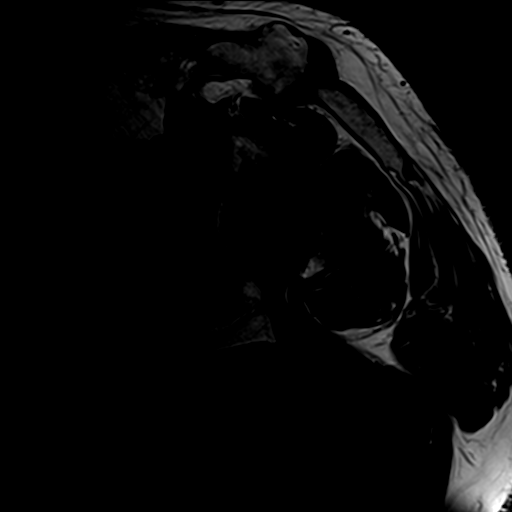
[im 23/28]
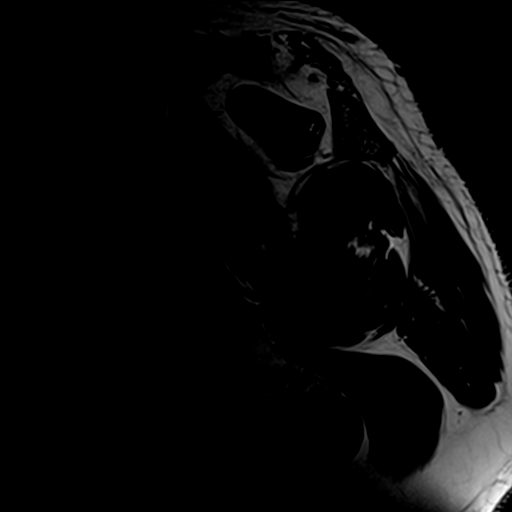
[im 28/28]
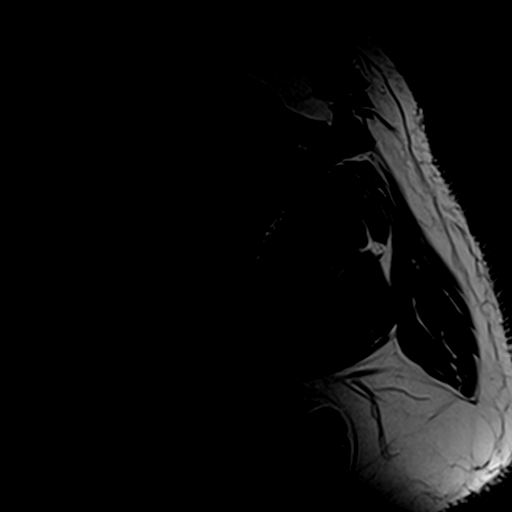

[19 of 40 positions shown; findings below may reference images not displayed]

FINDINGS: Rotator cuff: Moderate tendinosis of the supraspinatus tendon with a
high-grade partial-thickness bursal surface tear of the
supraspinatus tendon measuring 14 mm in anterior- posterior
dimension and encompassing 90% of the footprint. Moderate tendinosis
of the infraspinatus tendon with a small interstitial tear of the
muscular tendinous junction. Teres minor tendon is intact.
Subscapularis tendon is intact.

Muscles: No atrophy or fatty replacement of nor abnormal signal
within, the muscles of the rotator cuff.

Biceps long head: Moderate tendinosis of the intraarticular portion
of the long head of the biceps tendon.

Acromioclavicular Joint: Mild degenerative changes of the
acromioclavicular joint. No significant subacromial/ subdeltoid
bursal fluid. Type II acromion.

Glenohumeral Joint: No significant joint effusion. Moderate amount
of fluid in the subcoracoid recess.

Labrum: Grossly intact, but evaluation is limited by lack of
intraarticular fluid.

Bones: No focal marrow signal abnormality. No acute fracture or
dislocation.
IMPRESSION: 1. Moderate tendinosis of the supraspinatus tendon with a high-grade
partial-thickness bursal surface tear of the supraspinatus tendon
measuring 14 mm in anterior- posterior dimension and encompassing
90% of the footprint.
2. Moderate tendinosis of the infraspinatus tendon with a small
interstitial tear of the muscular tendinous junction.
3. Moderate tendinosis of the intraarticular portion of the long
head of the biceps tendon.

## 2017-07-17 ENCOUNTER — Other Ambulatory Visit: Payer: Self-pay | Admitting: Cardiovascular Disease

## 2017-11-16 ENCOUNTER — Other Ambulatory Visit: Payer: Self-pay | Admitting: Cardiovascular Disease

## 2017-11-18 ENCOUNTER — Encounter: Payer: Self-pay | Admitting: *Deleted

## 2017-11-18 ENCOUNTER — Encounter: Payer: Self-pay | Admitting: Cardiovascular Disease

## 2017-11-18 ENCOUNTER — Ambulatory Visit: Payer: Medicare HMO | Admitting: Cardiovascular Disease

## 2017-11-18 VITALS — BP 128/82 | HR 74 | Ht 68.0 in | Wt 233.0 lb

## 2017-11-18 DIAGNOSIS — I2510 Atherosclerotic heart disease of native coronary artery without angina pectoris: Secondary | ICD-10-CM

## 2017-11-18 DIAGNOSIS — I10 Essential (primary) hypertension: Secondary | ICD-10-CM

## 2017-11-18 DIAGNOSIS — E7800 Pure hypercholesterolemia, unspecified: Secondary | ICD-10-CM

## 2017-11-18 DIAGNOSIS — I25118 Atherosclerotic heart disease of native coronary artery with other forms of angina pectoris: Secondary | ICD-10-CM

## 2017-11-18 DIAGNOSIS — Z9861 Coronary angioplasty status: Secondary | ICD-10-CM | POA: Diagnosis not present

## 2017-11-18 NOTE — Patient Instructions (Addendum)
Medication Instructions:  Continue all current medications.  Labwork: none  Testing/Procedures: none  Follow-Up: Your physician wants you to follow up in:  1 year.  You will receive a reminder letter in the mail one-two months in advance.  If you don't receive a letter, please call our office to schedule the follow up appointment   Any Other Special Instructions Will Be Listed Below (If Applicable). DOT letter provided today.   If you need a refill on your cardiac medications before your next appointment, please call your pharmacy.

## 2017-11-18 NOTE — Progress Notes (Signed)
SUBJECTIVE: The patient presents for follow-up of coronary artery disease with prior percutaneous coronary intervention (angioplasty, no stent) in the early 1990's, hypertension, and dyslipidemia. He has been intolerant to multiple cholesterol medications.  He has been doing well and denies chest pain, palpitations, leg swelling, and shortness of breath.  He has not had to use nitroglycerin since his last visit with me.  He has not had any hospitalizations.  ECG performed in the office today which I personally interpreted demonstrated sinus rhythm with right bundle branch block and left anterior fascicular block with possible old septal infarct and nonspecific ST segment and T wave abnormalities.    Review of Systems: As per "subjective", otherwise negative.  Allergies  Allergen Reactions  . Simvastatin Other (See Comments)    dizziness    Current Outpatient Medications  Medication Sig Dispense Refill  . allopurinol (ZYLOPRIM) 300 MG tablet Take 300 mg by mouth daily.   6  . aspirin EC 81 MG tablet Take 81 mg by mouth daily.    Marland Kitchen CINNAMON PO Take 2 capsules by mouth daily.    . colchicine (COLCRYS) 0.6 MG tablet Take 0.6 mg by mouth 3 (three) times daily as needed (gout attacks).    . fenofibrate 160 MG tablet Take 160 mg by mouth daily.    Marland Kitchen glipiZIDE (GLUCOTROL) 5 MG tablet Take by mouth 2 (two) times daily before a meal.    . lisinopril (PRINIVIL,ZESTRIL) 5 MG tablet Take 5 mg by mouth daily.    . meclizine (ANTIVERT) 12.5 MG tablet Take 1 tablet (12.5 mg total) by mouth 3 (three) times daily as needed for dizziness. 15 tablet 0  . metFORMIN (GLUCOPHAGE) 1000 MG tablet 1,000 mg 2 (two) times daily with a meal.   5  . metoprolol succinate (TOPROL-XL) 50 MG 24 hr tablet Take 100 mg by mouth daily. Take with or immediately following a meal.    . Multiple Vitamin (MULTIVITAMIN WITH MINERALS) TABS tablet Take 1 tablet by mouth daily.    . Omega-3 Fatty Acids (FISH OIL PO) Take 4  capsules by mouth daily.     Marland Kitchen omeprazole (PRILOSEC) 20 MG capsule Take 20 mg by mouth daily.   2  . tamsulosin (FLOMAX) 0.4 MG CAPS capsule Take 0.4 mg by mouth at bedtime.      No current facility-administered medications for this visit.     Past Medical History:  Diagnosis Date  . CAD (coronary artery disease)   . Cancer ([place])   . Diabetes ([place])   . Dyslipidemia   . Gout   . HTN (hypertension)     Past Surgical History:  Procedure Laterality Date  . BACK SURGERY    . left knee surgery    . left shoulder surgery    . right knee surgery      Social History   Socioeconomic History  . Marital status: Married    Spouse name: Not on file  . Number of children: Not on file  . Years of education: Not on file  . Highest education level: Not on file  Social Needs  . Financial resource strain: Not on file  . Food insecurity - worry: Not on file  . Food insecurity - inability: Not on file  . Transportation needs - medical: Not on file  . Transportation needs - non-medical: Not on file  Occupational History  . Not on file  Tobacco Use  . Smoking status: Former Smoker  Packs/day: 5.00    Years: 36.00    Pack years: 180.00    Types: Cigarettes    Start date: 11/24/1952    Last attempt to quit: 11/24/1988    Years since quitting: 29.0  . Smokeless tobacco: Current User    Types: Snuff  Substance and Sexual Activity  . Alcohol use: No    Alcohol/week: 0.0 oz  . Drug use: No  . Sexual activity: Not on file  Other Topics Concern  . Not on file  Social History Narrative  . Not on file     Vitals:   11/18/17 0939  BP: 128/82  Pulse: 74  SpO2: 97%  Weight: 233 lb (105.7 kg)  Height: 5\' 8"  (1.727 m)    Wt Readings from Last 3 Encounters:  11/18/17 233 lb (105.7 kg)  11/08/16 235 lb (106.6 kg)  11/01/15 240 lb (108.9 kg)     PHYSICAL EXAM General: NAD HEENT: Normal. Neck: No JVD, no thyromegaly. Lungs: Clear to auscultation bilaterally with normal  respiratory effort. CV: Regular rate and rhythm, normal S1/S2, no S3/S4, no murmur. No pretibial or periankle edema.  No carotid bruit.   Abdomen: Soft, nontender, no distention.  Neurologic: Alert and oriented.  Psych: Normal affect. Skin: Normal. Musculoskeletal: No gross deformities.    ECG: Most recent ECG reviewed.   Labs: Lab Results  Component Value Date/Time   K 3.9 11/17/2016 12:14 PM   BUN 17 11/17/2016 12:14 PM   CREATININE 0.70 11/17/2016 12:14 PM   ALT 23 11/17/2016 12:07 PM   HGB 16.3 11/17/2016 12:14 PM     Lipids: No results found for: LDLCALC, LDLDIRECT, CHOL, TRIG, HDL     ASSESSMENT AND PLAN:  1. CAD with prior PCI: Symptomatically stable. Continue ASA and metoprolol.  He has been intolerant of statin therapy.  2. Essential HTN: Well controlled. No changes.  3. Dyslipidemia: He previously stopped Crestor due to side effects. He has been intolerant of statin therapy. Obtain copy of lipids from PCP.      Disposition: Follow up 1 year.   Kate Sable, M.D., F.A.C.C.

## 2017-12-19 IMAGING — CT CT ANGIO NECK
1 of 9 series · 1 of 36 positions shown · IV contrast (Iohexol (Omnipaque 350))
Comparison: Noncontrast head CT performed earlier the same day

CLINICAL DATA: Generalized weakness. Dysarthria. Syncopal episode.

EXAM:
CT ANGIOGRAPHY HEAD AND NECK
TECHNIQUE: Multidetector CT imaging of the head and neck was performed using
the standard protocol during bolus administration of intravenous
contrast. Multiplanar CT image reconstructions and MIPs were
obtained to evaluate the vascular anatomy. Carotid stenosis
measurements (when applicable) are obtained utilizing NASCET
criteria, using the distal internal carotid diameter as the
denominator.
CONTRAST:  50 cc Isovue 370 intravenous

[Series 200: locator · axial · 0.49mm/px · 1 of 1 slices shown]
[im 1/1  soft-tissue]
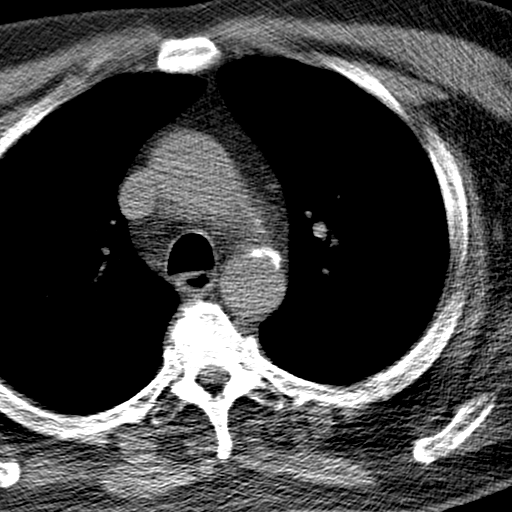

[1 of 36 positions shown; findings below may reference images not displayed]

FINDINGS: CTA NECK FINDINGS

Aortic arch: Atheromatous wall thickening and calcification. Three
vessel branching. No acute finding.

Right carotid system: Prominent noncalcified plaque on the distal
common carotid anterior wall without hemodynamic significance. Mixed
density plaque at the common carotid bifurcation and in the proximal
ICA with ICA narrowing measuring up to 30-40%. No evidence of
dissection or ulceration.

Left carotid system: Noncalcified plaque in the left ICA at the
level of the mandibular angle causes luminal narrowing up to 30-40%.
No evidence of dissection or ulceration.

Vertebral arteries: No flow limiting stenosis in the proximal
subclavian arteries. Mild right vertebral artery dominance. Possible
left vertebral ostial stenosis, but limited by artifact and small
vessel size. Elsewhere smooth and widely patent vertebral arteries
to the dura.

Skeleton: No acute finding. Diffuse advanced disc and facet
degeneration with spinal stenosis from C3-4 to C5-6.

Other neck: No detected mass or adenopathy

Upper chest: Mild airway thickening.  No acute finding.

Review of the MIP images confirms the above findings

CTA HEAD FINDINGS

Anterior circulation: Diffuse atherosclerotic plaque on the carotid
siphons with mild paraclinoid ICA narrowing. No major branch
occlusion or flow limiting stenosis. Negative for aneurysm.

Posterior circulation: Mild right vertebral artery dominance.
Atherosclerotic irregularity of the bilateral V4 segments with
moderate stenosis on the right and moderate to advanced stenosis on
the left. Basilar is smooth and patent. Negative bilateral PCA.

Venous sinuses: Patent

Anatomic variants: Fetal type right PCA. No visible anterior left
posterior communicating artery.

Delayed phase: Not performed.

Review of the MIP images confirms the above findings
IMPRESSION: 1. No emergent large vessel occlusion.
2. Atherosclerotic bilateral V4 segment stenoses, moderate on the
right and borderline advanced on the left. Possible left vertebral
ostial stenosis, limited by artifact.
3. Cervical carotid atherosclerosis with 30-40% proximal ICA
stenosis bilaterally.

## 2017-12-19 IMAGING — CT CT HEAD CODE STROKE
3 of 4 series · 17 of 47 positions shown, 20 images · non-contrast
Comparison: None.

CLINICAL DATA: Code stroke. Syncope with generalized weakness.
Dysarthria.

EXAM:
CT HEAD WITHOUT CONTRAST
TECHNIQUE: Contiguous axial images were obtained from the base of the skull
through the vertex without intravenous contrast.

[Series 201: head w/o, idose (1) · axial · non-contrast · 0.46mm/px · z∈[+100,+230]mm · 11 of 32 slices shown, 14 images]
[im 3/32  brain]
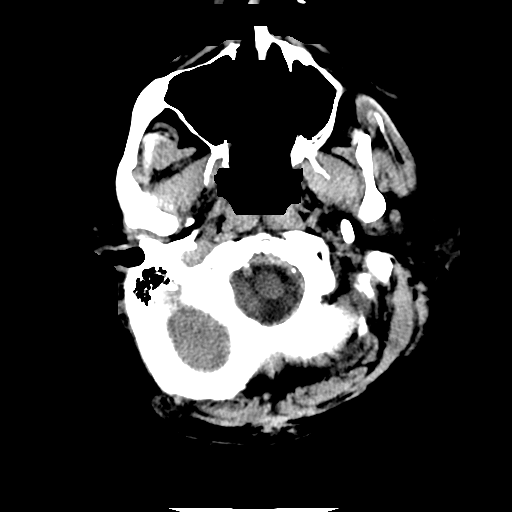
[im 3/32  bone]
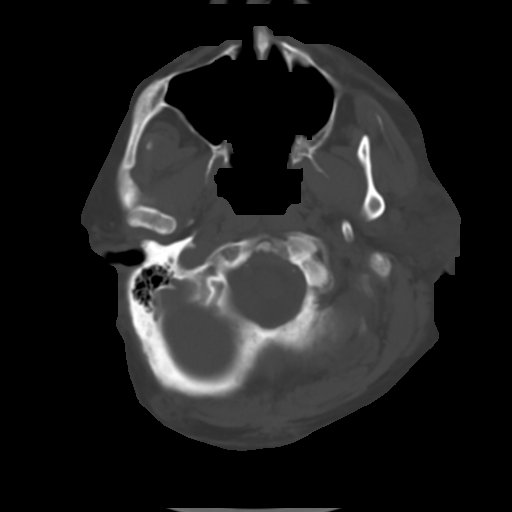
[im 5/32  brain]
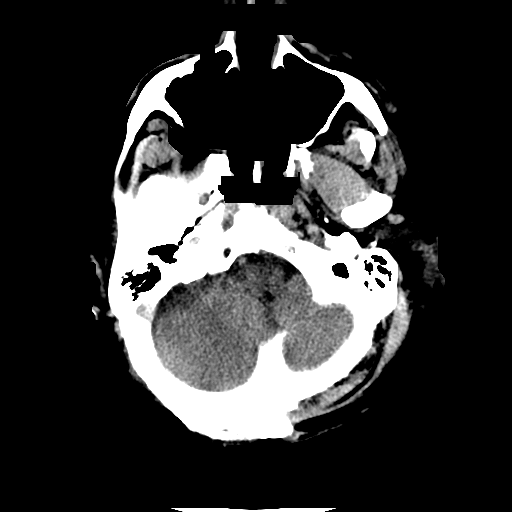
[im 7/32  brain]
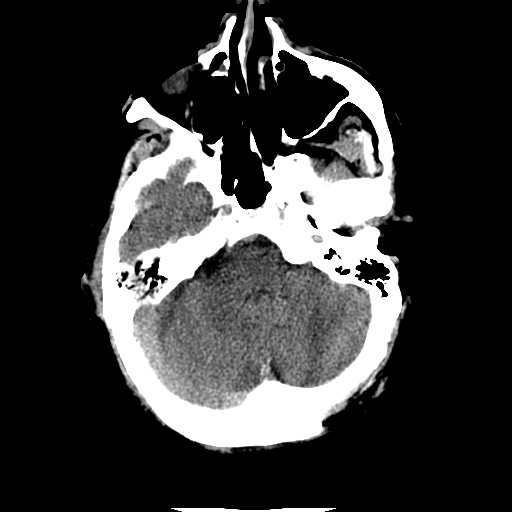
[im 12/32  brain]
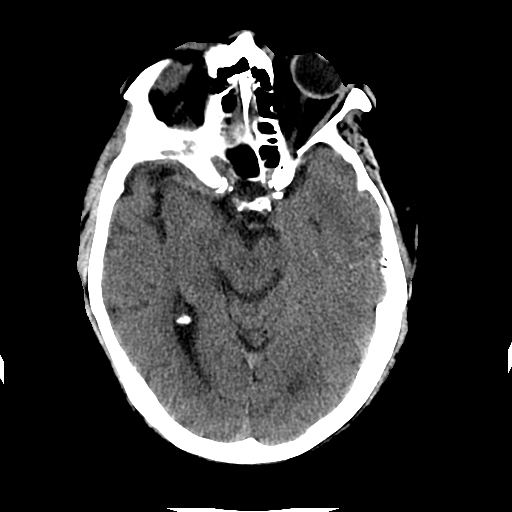
[im 14/32  brain]
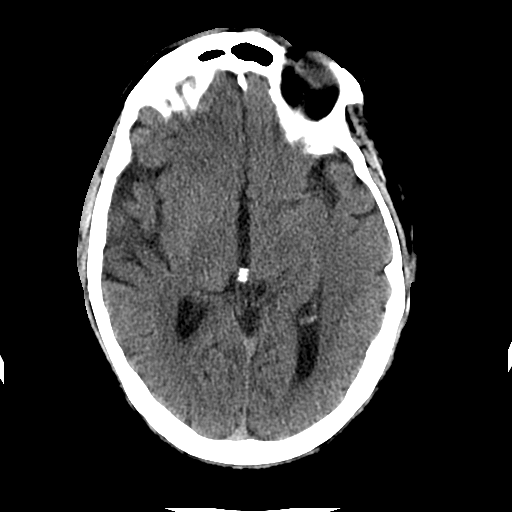
[im 14/32  bone]
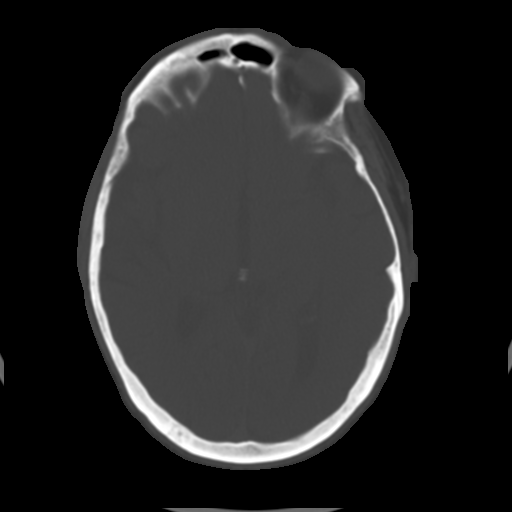
[im 16/32  brain]
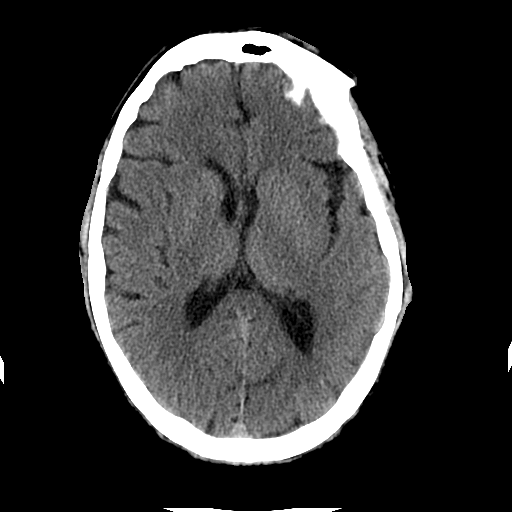
[im 18/32  brain]
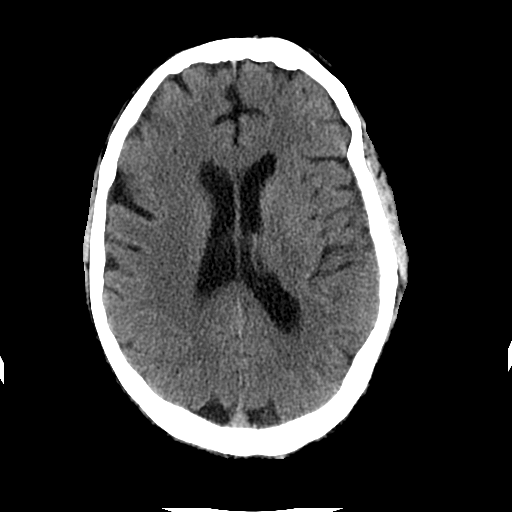
[im 20/32  brain]
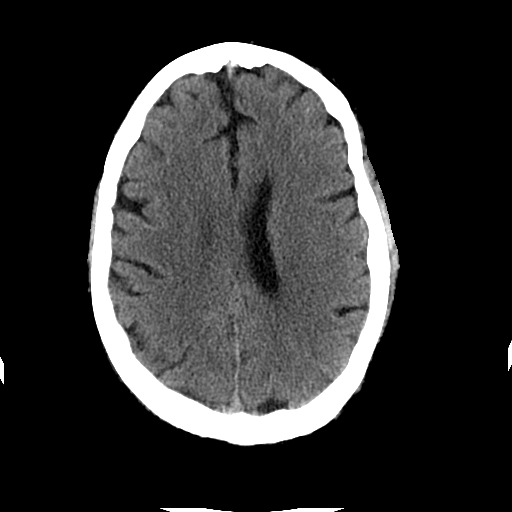
[im 25/32  brain]
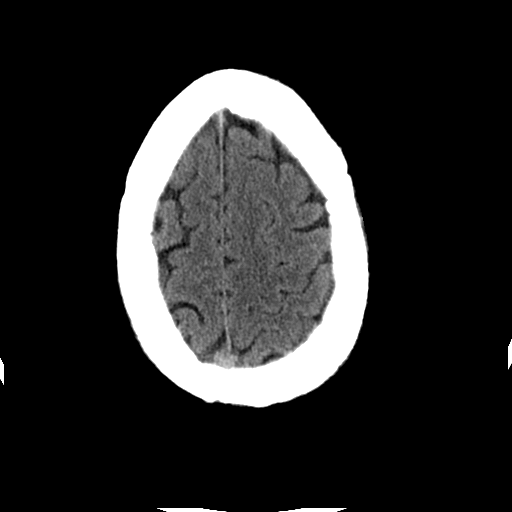
[im 25/32  bone]
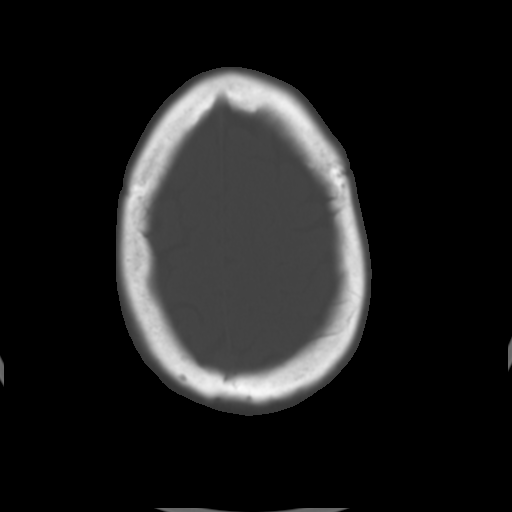
[im 27/32  brain]
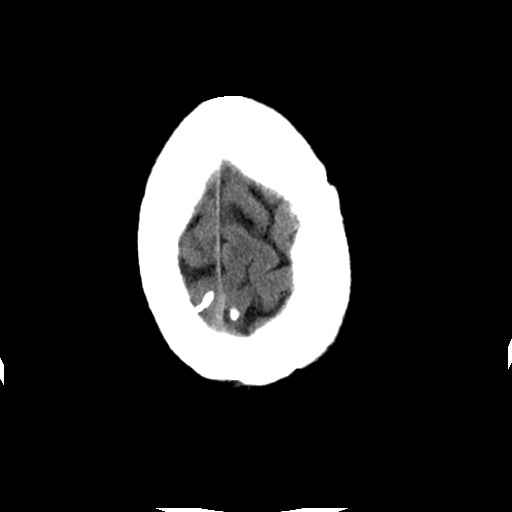
[im 29/32  brain]
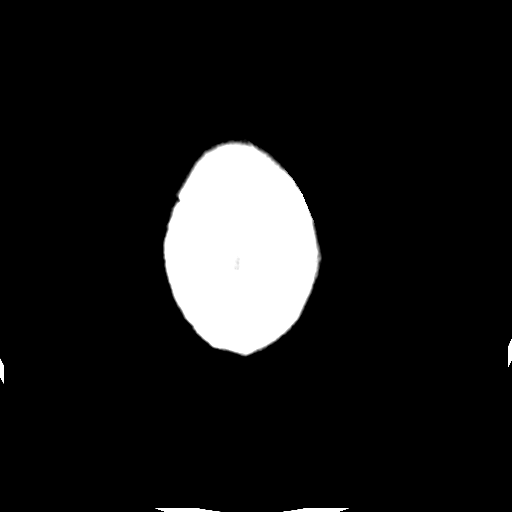

[Series 203: coronal st, idose (1) · coronal · 0.40mm/px · 3 of 79 slices shown]
[im 27/79  brain]
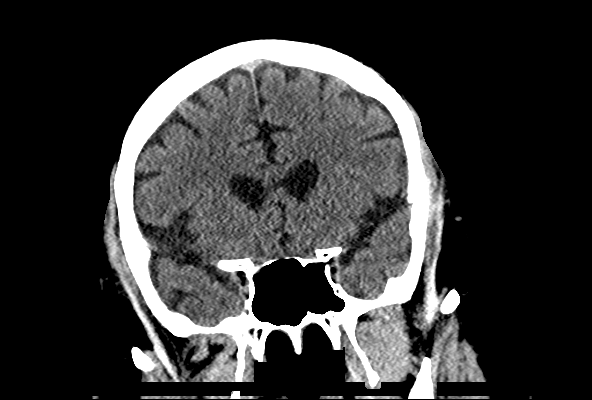
[im 35/79  brain]
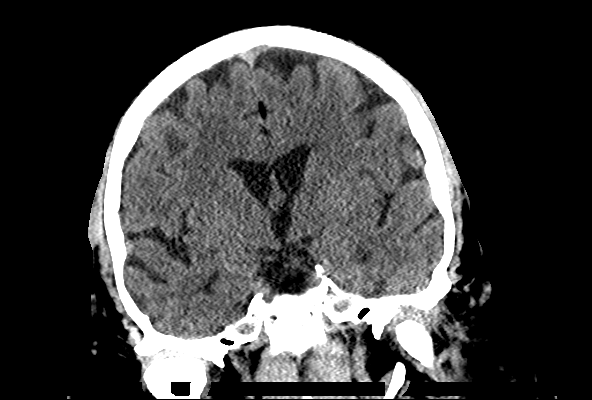
[im 44/79  brain]
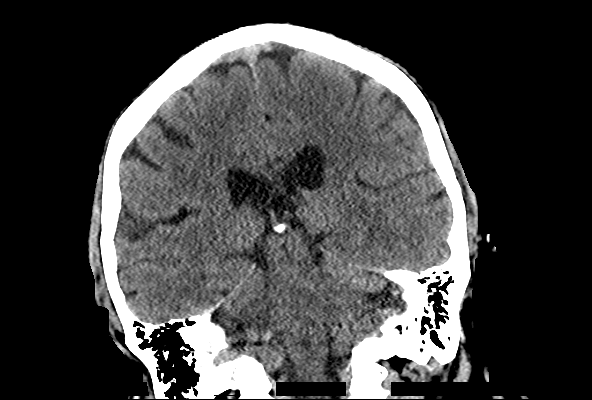

[Series 204: sagittal st, idose (1) · sagittal · 0.40mm/px · 3 of 79 slices shown]
[im 27/79  brain]
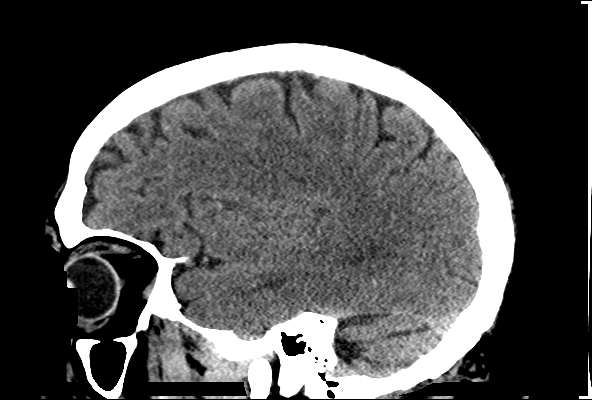
[im 40/79  brain]
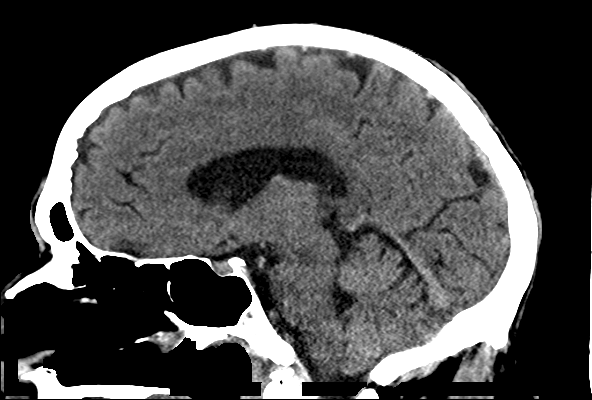
[im 53/79  brain]
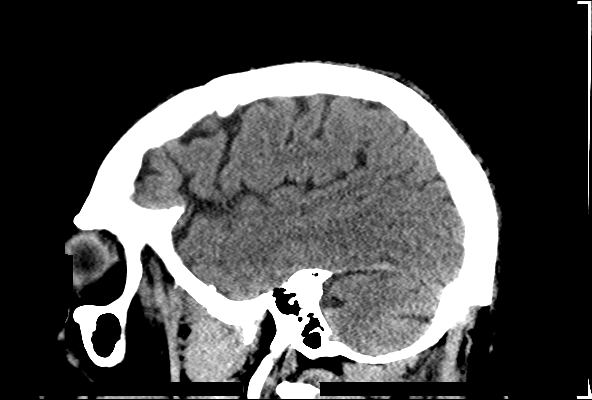

[17 of 47 positions shown; findings below may reference images not displayed]

FINDINGS: Brain: No evidence of acute infarction, hemorrhage, hydrocephalus,
extra-axial collection or mass lesion/mass effect.

Vascular: No hyperdense vessel.  Atherosclerotic calcification.

Skull: No acute or aggressive finding.

Sinuses/Orbits: Negative

Other: Text paged with results at the time of interpretation on
11/17/2016 at [DATE] to Dr. Jim, with pending verbal
acknowledgment.

ASPECTS not scored based on the history.
IMPRESSION: No acute finding.

## 2017-12-19 IMAGING — MR MR HEAD W/O CM
9 of 11 series · 37 of 48 positions shown · non-contrast
Comparison: CT 06/27/2016

CLINICAL DATA: Dysarthria.  Hypertension diabetes

EXAM:
MRI HEAD WITHOUT CONTRAST
TECHNIQUE: Multiplanar, multiecho pulse sequences of the brain and surrounding
structures were obtained without intravenous contrast.

[Series 4: T1 · sagittal · 5.0mm · 0.47mm/px · 3 of 24 slices shown]
[im 1/24]
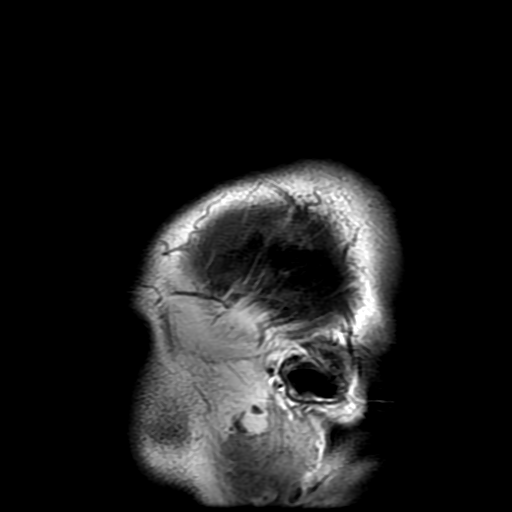
[im 12/24]
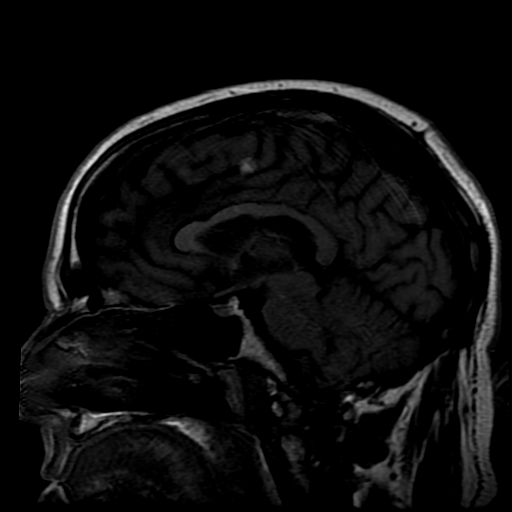
[im 24/24]
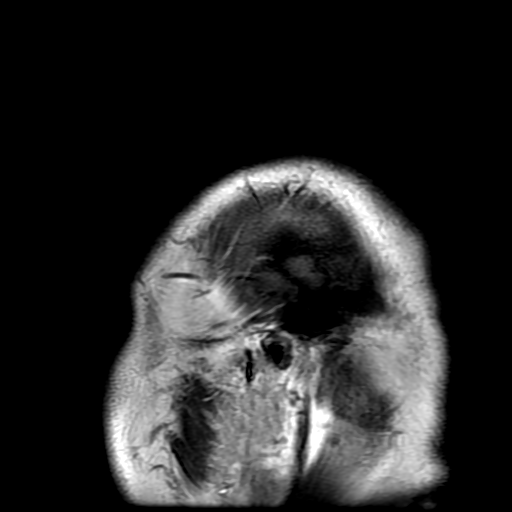

[Series 5: DWI · axial · 3.0mm · 1.09mm/px · z∈[-62,+82]mm · 9 of 98 slices shown (1 of 4)]
[im 1/98]
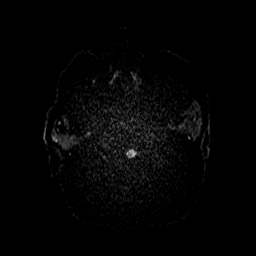
[im 13/98]
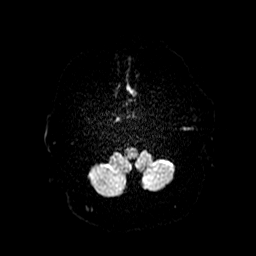
[im 25/98]
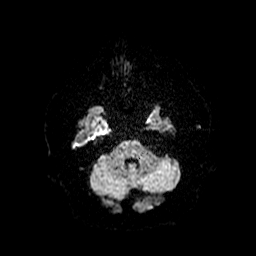
[im 37/98]
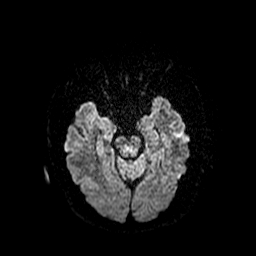
[im 49/98]
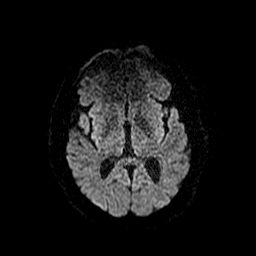
[im 61/98]
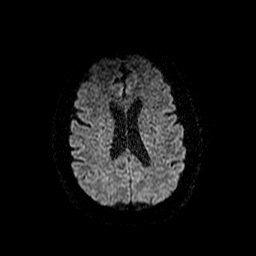
[im 73/98]
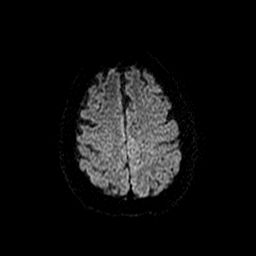
[im 85/98]
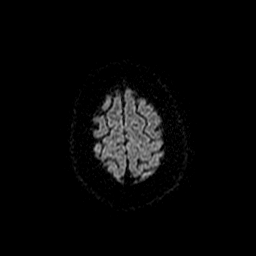
[im 98/98]
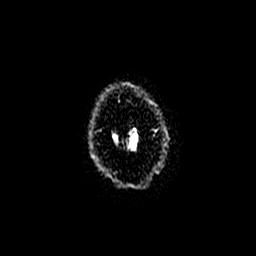

[Series 6: DWI · coronal · 5.0mm · 1.09mm/px · 7 of 78 slices shown (2 of 4)]
[im 1/78]
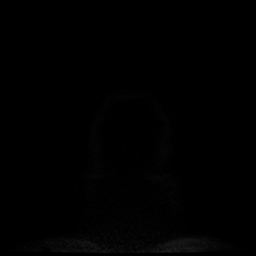
[im 13/78]
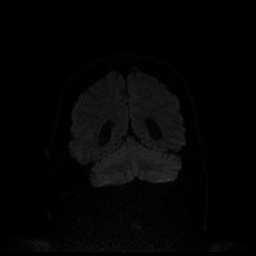
[im 26/78]
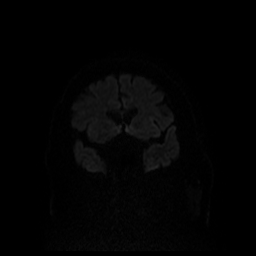
[im 39/78]
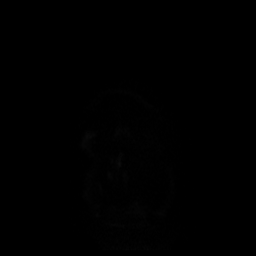
[im 52/78]
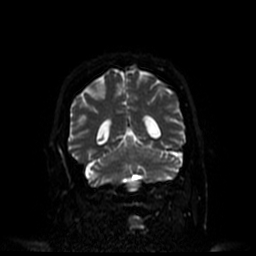
[im 65/78]
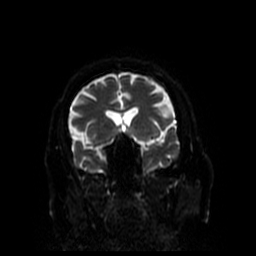
[im 78/78]
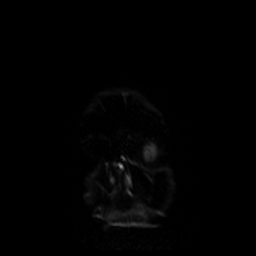

[Series 7: T2 · axial · 5.0mm · 0.49mm/px · z∈[-54,+90]mm · 2 of 25 slices shown (1 of 2)]
[im 1/25]
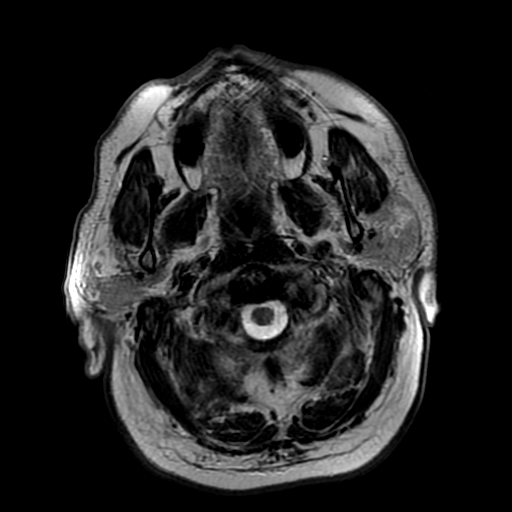
[im 25/25]
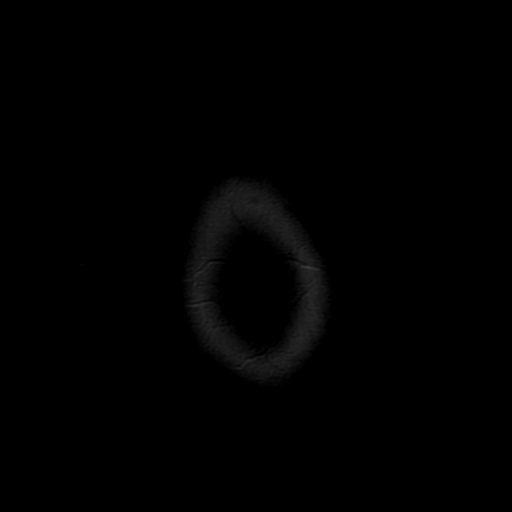

[Series 8: FLAIR · axial · 5.0mm · 0.49mm/px · z∈[-54,+90]mm · 2 of 25 slices shown (1 of 2)]
[im 1/25]
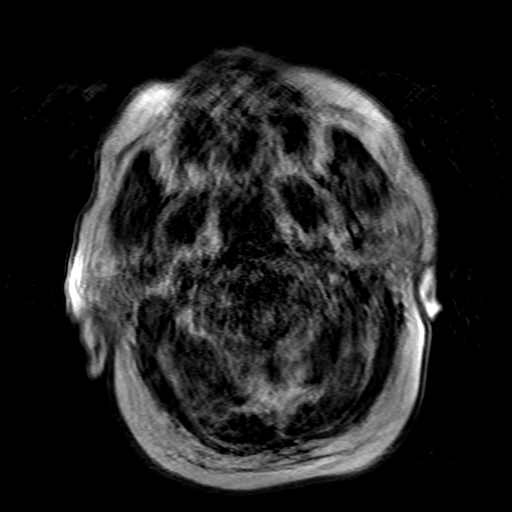
[im 25/25]
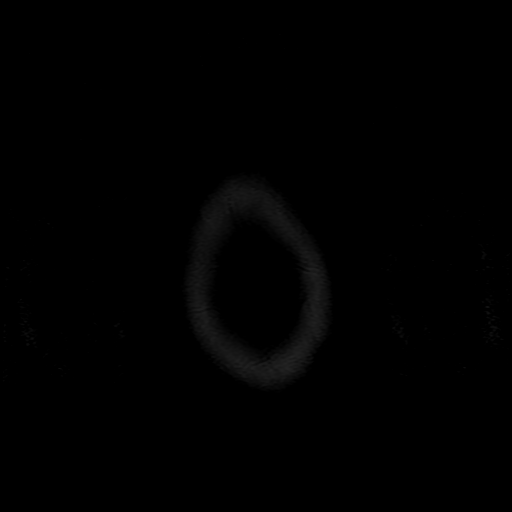

[Series 11: T2 · coronal · 5.0mm · 0.43mm/px · 3 of 33 slices shown (2 of 2)]
[im 1/33]
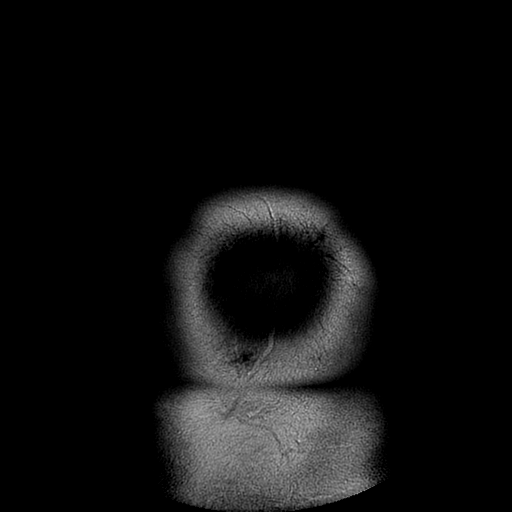
[im 17/33]
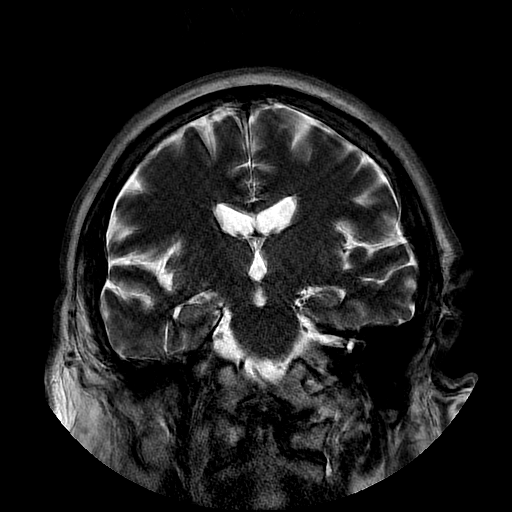
[im 33/33]
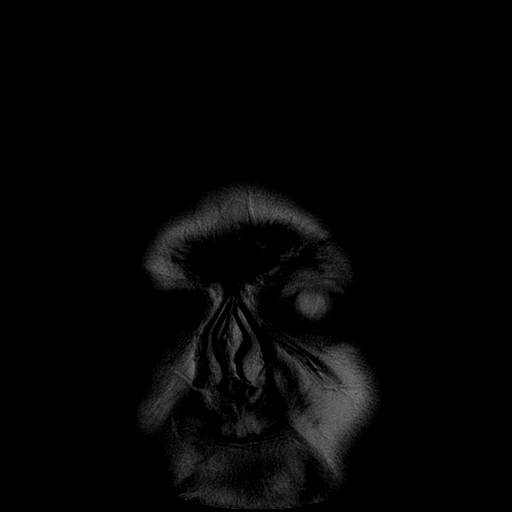

[Series 12: FLAIR · axial · 5.0mm · 0.49mm/px · z∈[-54,+90]mm · 2 of 25 slices shown (2 of 2)]
[im 1/25]
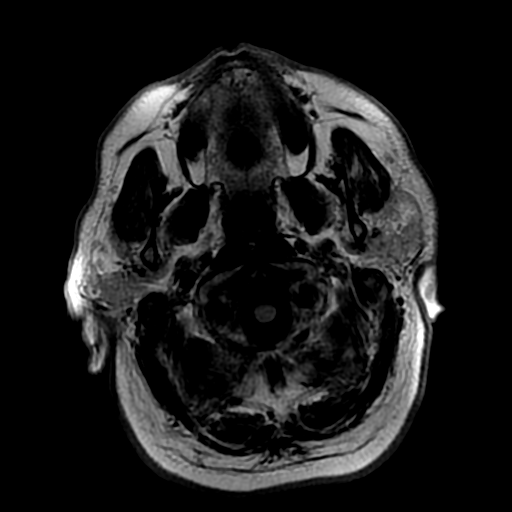
[im 25/25]
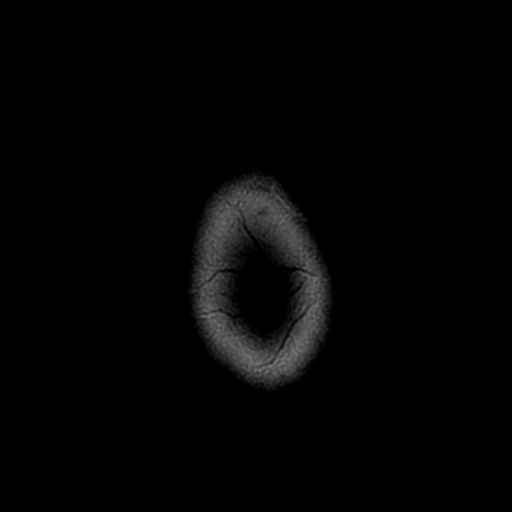

[Series 500: DWI · axial · 3.0mm · 1.09mm/px · z∈[-62,+82]mm · 5 of 49 slices shown (3 of 4)]
[im 1/49]
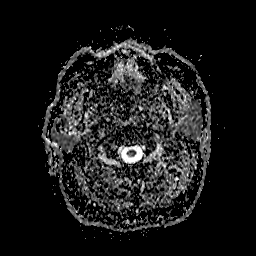
[im 13/49]
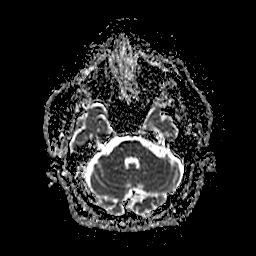
[im 25/49]
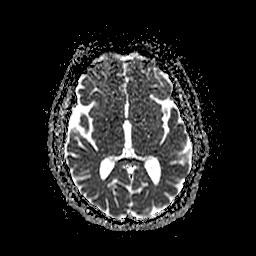
[im 37/49]
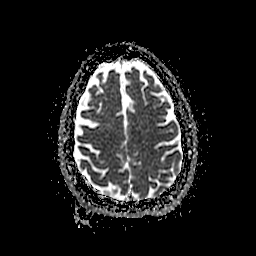
[im 49/49]
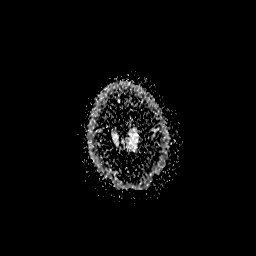

[Series 600: DWI · coronal · 5.0mm · 1.09mm/px · 4 of 39 slices shown (4 of 4)]
[im 1/39]
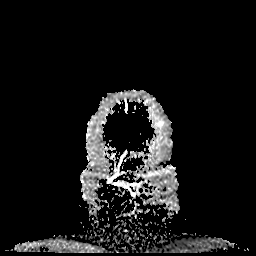
[im 13/39]
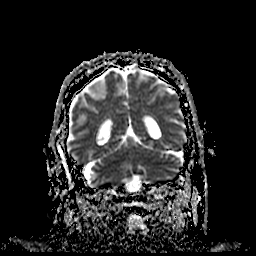
[im 26/39]
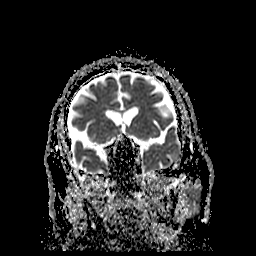
[im 39/39]
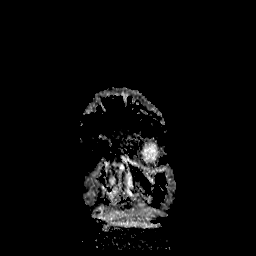

[37 of 48 positions shown; findings below may reference images not displayed]

FINDINGS: Brain: Ventricle size normal.  Cerebral volume normal for age.

Negative for acute infarct. No significant chronic ischemia.
Negative for hemorrhage or mass. No edema or shift of the midline
structures.

Vascular: Normal arterial flow void.

Skull and upper cervical spine:  negative

Sinuses/Orbits: Negative

Other: None
IMPRESSION: Normal for age MRI of the brain without contrast.

## 2018-01-15 ENCOUNTER — Other Ambulatory Visit: Payer: Self-pay | Admitting: Cardiovascular Disease

## 2018-06-27 ENCOUNTER — Other Ambulatory Visit: Payer: Self-pay

## 2018-06-27 ENCOUNTER — Encounter (HOSPITAL_COMMUNITY): Payer: Self-pay | Admitting: Emergency Medicine

## 2018-06-27 ENCOUNTER — Emergency Department (HOSPITAL_COMMUNITY): Payer: Medicare HMO

## 2018-06-27 ENCOUNTER — Emergency Department (HOSPITAL_COMMUNITY)
Admission: EM | Admit: 2018-06-27 | Discharge: 2018-06-27 | Disposition: A | Discharge status: Home / Self Care | Payer: Medicare HMO | Attending: Emergency Medicine | Admitting: Emergency Medicine

## 2018-06-27 DIAGNOSIS — Z79899 Other long term (current) drug therapy: Secondary | ICD-10-CM | POA: Insufficient documentation

## 2018-06-27 DIAGNOSIS — R109 Unspecified abdominal pain: Secondary | ICD-10-CM | POA: Diagnosis not present

## 2018-06-27 DIAGNOSIS — I10 Essential (primary) hypertension: Secondary | ICD-10-CM | POA: Insufficient documentation

## 2018-06-27 DIAGNOSIS — E119 Type 2 diabetes mellitus without complications: Secondary | ICD-10-CM | POA: Insufficient documentation

## 2018-06-27 DIAGNOSIS — Z7901 Long term (current) use of anticoagulants: Secondary | ICD-10-CM | POA: Diagnosis not present

## 2018-06-27 DIAGNOSIS — Z87891 Personal history of nicotine dependence: Secondary | ICD-10-CM | POA: Diagnosis not present

## 2018-06-27 DIAGNOSIS — I2510 Atherosclerotic heart disease of native coronary artery without angina pectoris: Secondary | ICD-10-CM | POA: Diagnosis not present

## 2018-06-27 DIAGNOSIS — R1032 Left lower quadrant pain: Secondary | ICD-10-CM | POA: Diagnosis present

## 2018-06-27 DIAGNOSIS — R197 Diarrhea, unspecified: Secondary | ICD-10-CM

## 2018-06-27 DIAGNOSIS — Z859 Personal history of malignant neoplasm, unspecified: Secondary | ICD-10-CM | POA: Insufficient documentation

## 2018-06-27 DIAGNOSIS — R112 Nausea with vomiting, unspecified: Secondary | ICD-10-CM | POA: Insufficient documentation

## 2018-06-27 LAB — URINALYSIS, ROUTINE W REFLEX MICROSCOPIC
Bacteria, UA: NONE SEEN
Bilirubin Urine: NEGATIVE
Glucose, UA: NEGATIVE mg/dL
Ketones, ur: 5 mg/dL — AB
Leukocytes, UA: NEGATIVE
Nitrite: NEGATIVE
Protein, ur: 100 mg/dL — AB
Specific Gravity, Urine: 1.023 (ref 1.005–1.030)
pH: 5 (ref 5.0–8.0)

## 2018-06-27 LAB — COMPREHENSIVE METABOLIC PANEL
ALT: 20 U/L (ref 0–44)
AST: 25 U/L (ref 15–41)
Albumin: 4.4 g/dL (ref 3.5–5.0)
Alkaline Phosphatase: 43 U/L (ref 38–126)
Anion gap: 11 (ref 5–15)
BUN: 16 mg/dL (ref 8–23)
CO2: 23 mmol/L (ref 22–32)
Calcium: 8.9 mg/dL (ref 8.9–10.3)
Chloride: 101 mmol/L (ref 98–111)
Creatinine, Ser: 0.83 mg/dL (ref 0.61–1.24)
GFR calc Af Amer: >60 mL/min (ref >60)
GFR calc non Af Amer: >60 mL/min (ref >60)
Glucose, Bld: 192 mg/dL — ABNORMAL HIGH (ref 70–99)
Potassium: 3.9 mmol/L (ref 3.5–5.1)
Sodium: 135 mmol/L (ref 135–145)
Total Bilirubin: 0.8 mg/dL (ref 0.3–1.2)
Total Protein: 7.8 g/dL (ref 6.5–8.1)

## 2018-06-27 LAB — CBC WITH DIFFERENTIAL/PLATELET
Basophils Absolute: 0 10*3/uL (ref 0.0–0.1)
Basophils Relative: 0 %
Eosinophils Absolute: 0.1 10*3/uL (ref 0.0–0.7)
Eosinophils Relative: 1 %
HCT: 42.9 % (ref 39.0–52.0)
Hemoglobin: 14.6 g/dL (ref 13.0–17.0)
Lymphocytes Relative: 18 %
Lymphs Abs: 2.5 10*3/uL (ref 0.7–4.0)
MCH: 29.4 pg (ref 26.0–34.0)
MCHC: 34 g/dL (ref 30.0–36.0)
MCV: 86.5 fL (ref 78.0–100.0)
Monocytes Absolute: 0.9 10*3/uL (ref 0.1–1.0)
Monocytes Relative: 6 %
Neutro Abs: 10.6 10*3/uL — ABNORMAL HIGH (ref 1.7–7.7)
Neutrophils Relative %: 75 %
Platelets: 234 10*3/uL (ref 150–400)
RBC: 4.96 MIL/uL (ref 4.22–5.81)
RDW: 13 % (ref 11.5–15.5)
WBC: 14.1 10*3/uL — ABNORMAL HIGH (ref 4.0–10.5)

## 2018-06-27 LAB — LIPASE, BLOOD: Lipase: 42 U/L (ref 11–51)

## 2018-06-27 MED ORDER — ONDANSETRON HCL 4 MG/2ML IJ SOLN
4.0000 mg | Freq: Once | INTRAMUSCULAR | Status: AC
  Administered 2018-06-27: 4 mg via INTRAVENOUS
  Filled 2018-06-27: qty 2

## 2018-06-27 MED ORDER — ONDANSETRON 4 MG PO TBDP: 4.0000 mg | ORAL_TABLET | Freq: Three times a day (TID) | ORAL | 0 refills | Status: DC | PRN

## 2018-06-27 MED ORDER — SODIUM CHLORIDE 0.9 % IV BOLUS
1000.0000 mL | Freq: Once | INTRAVENOUS | Status: AC
  Administered 2018-06-27: 1000 mL via INTRAVENOUS

## 2018-06-27 MED ORDER — IOPAMIDOL (ISOVUE-300) INJECTION 61%
100.0000 mL | Freq: Once | INTRAVENOUS | Status: AC | PRN
  Administered 2018-06-27: 100 mL via INTRAVENOUS

## 2018-06-27 NOTE — ED Triage Notes (Signed)
Pt reports L flank pain with N/V and D for the last hour

## 2018-06-27 NOTE — ED Provider Notes (Signed)
[facility] EMERGENCY DEPARTMENT Provider Note   CSN: [id number] Arrival date & time: 06/27/18  1928     History   Chief Complaint Chief Complaint  Patient presents with  . Flank Pain    L    HPI [name] [name]. is a 72 y.o. male.  HPI Patient presents with abdominal pain.  Has had nausea vomiting and diarrhea.  Nausea vomiting diarrhea began a few days ago the abdominal pain around yesterday worse today.  Pain is in his left lower abdomen and goes to his right back below his shoulder blade.  States he has had decreased appetite for a while now and is lost some weight.  States he has not weighed himself however.  States his abdomen may be a little bigger than before.  No fevers or chills.  States he has felt weak all over. Past Medical History:  Diagnosis Date  . CAD (coronary artery disease)   . Cancer ([place])   . Diabetes ([place])   . Dyslipidemia   . Gout   . HTN (hypertension)     Patient Active Problem List   Diagnosis Date Noted  . SOB (shortness of breath) on exertion 07/22/2013  . CAD S/P percutaneous coronary angioplasty 07/09/2013  . HTN (hypertension) 07/09/2013  . Hyperlipidemia 07/09/2013  . Chest pain 07/09/2013    Past Surgical History:  Procedure Laterality Date  . BACK SURGERY    . left knee surgery    . left shoulder surgery    . right knee surgery          Home Medications    Prior to Admission medications   Medication Sig Start Date End Date Taking? Authorizing Provider  aspirin EC 81 MG tablet Take 81 mg by mouth daily.   Yes [provider]  glipiZIDE (GLUCOTROL) 5 MG tablet Take by mouth 2 (two) times daily before a meal.   Yes [provider]  lisinopril (PRINIVIL,ZESTRIL) 5 MG tablet Take 5 mg by mouth daily.   Yes [provider]  meclizine (ANTIVERT) 25 MG tablet Take 25 mg by mouth 3 (three) times daily as needed for dizziness.   Yes [provider]  metFORMIN (GLUCOPHAGE) 1000 MG tablet 1,000  mg 2 (two) times daily with a meal.  10/02/15  Yes [provider]  metoprolol succinate (TOPROL-XL) 50 MG 24 hr tablet TAKE 1 & 1/2 (ONE & ONE-HALF) TABLETS BY MOUTH ONCE DAILY WITH OR IMMEDIATELY FOLLOWING A MEAL 01/15/18  Yes Herminio Commons, MD  Multiple Vitamin (MULTIVITAMIN WITH MINERALS) TABS tablet Take 1 tablet by mouth daily.   Yes [provider]  Omega-3 Fatty Acids (FISH OIL PO) Take 4 capsules by mouth See admin instructions. Take 2 capsules in the morning, 2 in the evening.   Yes [provider]  omeprazole (PRILOSEC) 20 MG capsule Take 20 mg by mouth daily.  10/02/16  Yes [provider]  OVER THE COUNTER MEDICATION Take 1 tablet by mouth daily. Virmax Daily Testosterone Booster. Take 1 tablet daily   Yes [provider]  tamsulosin (FLOMAX) 0.4 MG CAPS capsule Take 0.4 mg by mouth at bedtime.  01/17/15  Yes [provider]  meclizine (ANTIVERT) 12.5 MG tablet Take 1 tablet (12.5 mg total) by mouth 3 (three) times daily as needed for dizziness. Patient not taking: Reported on 06/27/2018 11/17/16   Lajean Saver, MD  ondansetron (ZOFRAN-ODT) 4 MG disintegrating tablet Take 1 tablet (4 mg total) by mouth every 8 (eight)  hours as needed for nausea or vomiting. 06/27/18   Davonna Belling, MD    Family History Family History  Problem Relation Age of Onset  . Heart attack Father 25  . Colon cancer Brother 4    Social History Social History   Tobacco Use  . Smoking status: Former Smoker    Packs/day: 5.00    Years: 36.00    Pack years: 180.00    Types: Cigarettes    Start date: 11/24/1952    Last attempt to quit: 11/24/1988    Years since quitting: 29.6  . Smokeless tobacco: Current User    Types: Snuff  Substance Use Topics  . Alcohol use: No    Alcohol/week: 0.0 standard drinks  . Drug use: No     Allergies   Simvastatin   Review of Systems Review of Systems  Constitutional: Positive for appetite change,  fatigue and unexpected weight change.  HENT: Negative for congestion.   Respiratory: Negative for shortness of breath.   Cardiovascular: Negative for chest pain.  Gastrointestinal: Positive for abdominal pain, diarrhea, nausea and vomiting.  Genitourinary: Negative for flank pain.  Musculoskeletal: Negative for back pain and neck pain.  Skin: Negative for rash.  Neurological: Negative for seizures.  Hematological: Negative for adenopathy.  Psychiatric/Behavioral: Negative for confusion.     Physical Exam Updated Vital Signs BP (!) 160/82   Pulse 92   Temp 98 F (36.7 C) (Oral)   Resp 18   Ht 5\' 10"  (1.778 m)   Wt 104.3 kg   SpO2 96%   BMI 33.00 kg/m   Physical Exam  Constitutional: He appears well-developed.  HENT:  Head: Normocephalic.  Eyes: Pupils are equal, round, and reactive to light.  Neck: Neck supple.  Cardiovascular: Normal rate.  Pulmonary/Chest: Effort normal.  Abdominal: There is tenderness.  Left lower left mid abdominal tenderness.  No rebound or guarding.  Mild abdominal distention.  Musculoskeletal: He exhibits no edema.  Neurological: He is alert.  Skin: Skin is warm. Capillary refill takes less than 2 seconds.     ED Treatments / Results  Labs (all labs ordered are listed, but only abnormal results are displayed) Labs Reviewed  URINALYSIS, ROUTINE W REFLEX MICROSCOPIC - Abnormal; Notable for the following components:      Result Value   Hgb urine dipstick MODERATE (*)    Ketones, ur 5 (*)    Protein, ur 100 (*)    All other components within normal limits  COMPREHENSIVE METABOLIC PANEL - Abnormal; Notable for the following components:   Glucose, Bld 192 (*)    All other components within normal limits  CBC WITH DIFFERENTIAL/PLATELET - Abnormal; Notable for the following components:   WBC 14.1 (*)    Neutro Abs 10.6 (*)    All other components within normal limits  LIPASE, BLOOD    EKG None  Radiology Ct Abdomen Pelvis W  Contrast  Result Date: 06/27/2018 CLINICAL DATA:  Patient with left flank pain, nausea vomiting and diarrhea. EXAM: CT ABDOMEN AND PELVIS WITH CONTRAST TECHNIQUE: Multidetector CT imaging of the abdomen and pelvis was performed using the standard protocol following bolus administration of intravenous contrast. CONTRAST:  157mL ISOVUE-300 IOPAMIDOL (ISOVUE-300) INJECTION 61% COMPARISON:  None. FINDINGS: Lower chest: Normal heart size. Dependent atelectasis. No pleural effusion. Hepatobiliary: Liver is normal in size and contour. No focal hepatic lesion is identified. Gallbladder is unremarkable. No intrahepatic or extrahepatic biliary ductal dilatation. Pancreas: Unremarkable Spleen: Unremarkable Adrenals/Urinary Tract: Normal adrenal glands. Kidneys enhance symmetrically  with contrast. 2 cm cyst inferior pole left kidney. There is a 1.6 cm stone inferior pole right kidney. No ureterolithiasis. No hydronephrosis. Urinary bladder is unremarkable. Stomach/Bowel: No abnormal bowel wall thickening or evidence for bowel obstruction. No free fluid or free intraperitoneal air. Normal appendix. Normal morphology of the stomach. Vascular/Lymphatic: Normal caliber abdominal aorta. Peripheral calcified atherosclerotic plaque. Duplicated inferior vena cava. No retroperitoneal lymphadenopathy. Reproductive: Prostate is unremarkable. Other: Small fat containing left inguinal hernia. Musculoskeletal: Lumbar spine degenerative changes. No aggressive or acute appearing osseous lesions. IMPRESSION: No acute process within the abdomen or pelvis. Electronically Signed   By: Lovey Newcomer M.D.   On: 06/27/2018 21:16    Procedures Procedures (including critical care time)  Medications Ordered in ED Medications  sodium chloride 0.9 % bolus 1,000 mL (0 mLs Intravenous Stopped 06/27/18 2111)  ondansetron (ZOFRAN) injection 4 mg (4 mg Intravenous Given 06/27/18 2007)  iopamidol (ISOVUE-300) 61 % injection 100 mL (100 mLs Intravenous  Contrast Given 06/27/18 2056)     Initial Impression / Assessment and Plan / ED Course  I have reviewed the triage vital signs and the nursing notes.  Pertinent labs & imaging results that were available during my care of the patient were reviewed by me and considered in my medical decision making (see chart for details).     Patient with nausea vomiting diarrhea and abdominal pain.  Left flank pain.  Lab work reassuring.  CT scan reassuring.  Does have right-sided renal stone but nonobstructing.  Feels better after treatment.  Will discharge home.  Final Clinical Impressions(s) / ED Diagnoses   Final diagnoses:  Nausea vomiting and diarrhea  Abdominal pain, unspecified abdominal location    ED Discharge Orders         Ordered    ondansetron (ZOFRAN-ODT) 4 MG disintegrating tablet  Every 8 hours PRN     06/27/18 2153           Davonna Belling, MD 06/27/18 2158

## 2018-10-22 ENCOUNTER — Telehealth: Payer: Self-pay | Admitting: Cardiovascular Disease

## 2018-10-22 DIAGNOSIS — Z0289 Encounter for other administrative examinations: Secondary | ICD-10-CM

## 2018-10-22 DIAGNOSIS — I2510 Atherosclerotic heart disease of native coronary artery without angina pectoris: Secondary | ICD-10-CM

## 2018-10-22 DIAGNOSIS — Z9861 Coronary angioplasty status: Principal | ICD-10-CM

## 2018-10-22 NOTE — Telephone Encounter (Signed)
Spoke with patient in regards to needing stress test.  Patient already has OV scheduled for 11/19/18 in [place] office.  Last seen 11/18/2017.  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  Would like to do the week of his OV.

## 2018-10-22 NOTE — Telephone Encounter (Signed)
Patient called stating that he is scheduled for DOT physical on 11/19/2018 . States that DOT told him he needs to have stress test . [phone].

## 2018-10-22 NOTE — Addendum Note (Signed)
Addended by: Laurine Blazer on: 10/22/2018 12:06 PM   Modules accepted: Orders

## 2018-10-22 NOTE — Telephone Encounter (Signed)
That would be fine. Please obtain Lexiscan.

## 2018-10-22 NOTE — Telephone Encounter (Signed)
Pre-cert Verification for the following procedure   Lexiscan myoview scheduled for 11/17/2018 at [facility]

## 2018-10-22 NOTE — Telephone Encounter (Signed)
Patient notified via detailed voice message.

## 2018-10-23 ENCOUNTER — Encounter (HOSPITAL_COMMUNITY): Payer: Medicare HMO

## 2018-10-23 ENCOUNTER — Encounter: Payer: Self-pay | Admitting: *Deleted

## 2018-11-17 ENCOUNTER — Encounter (HOSPITAL_COMMUNITY)
Admission: RE | Admit: 2018-11-17 | Discharge: 2018-11-17 | Disposition: A | Discharge status: Home / Self Care | Payer: Medicare HMO | Source: Ambulatory Visit | Attending: Cardiovascular Disease | Admitting: Cardiovascular Disease

## 2018-11-17 ENCOUNTER — Encounter (HOSPITAL_COMMUNITY): Payer: Self-pay

## 2018-11-17 ENCOUNTER — Encounter (HOSPITAL_BASED_OUTPATIENT_CLINIC_OR_DEPARTMENT_OTHER)
Admission: RE | Admit: 2018-11-17 | Discharge: 2018-11-17 | Disposition: A | Discharge status: Home / Self Care | Payer: Medicare HMO | Source: Ambulatory Visit | Attending: Cardiovascular Disease | Admitting: Cardiovascular Disease

## 2018-11-17 DIAGNOSIS — Z9861 Coronary angioplasty status: Secondary | ICD-10-CM | POA: Insufficient documentation

## 2018-11-17 DIAGNOSIS — I2510 Atherosclerotic heart disease of native coronary artery without angina pectoris: Secondary | ICD-10-CM | POA: Insufficient documentation

## 2018-11-17 DIAGNOSIS — Z0289 Encounter for other administrative examinations: Secondary | ICD-10-CM | POA: Insufficient documentation

## 2018-11-17 LAB — NM MYOCAR MULTI W/SPECT W/WALL MOTION / EF
LV dias vol: 96 mL (ref 62–150)
LV sys vol: 44 mL
Peak HR: 92 {beats}/min
RATE: 0.25
Rest HR: 67 {beats}/min
SDS: 4
SRS: 3
SSS: 7
TID: 0.97

## 2018-11-17 MED ORDER — SODIUM CHLORIDE 0.9% FLUSH
INTRAVENOUS | Status: AC
  Administered 2018-11-17: 10 mL via INTRAVENOUS
  Filled 2018-11-17: qty 10

## 2018-11-17 MED ORDER — TECHNETIUM TC 99M TETROFOSMIN IV KIT
10.0000 | PACK | Freq: Once | INTRAVENOUS | Status: AC | PRN
  Administered 2018-11-17: 10.1 via INTRAVENOUS

## 2018-11-17 MED ORDER — REGADENOSON 0.4 MG/5ML IV SOLN
INTRAVENOUS | Status: AC
  Administered 2018-11-17: 0.4 mg via INTRAVENOUS
  Filled 2018-11-17: qty 5

## 2018-11-17 MED ORDER — TECHNETIUM TC 99M TETROFOSMIN IV KIT
30.0000 | PACK | Freq: Once | INTRAVENOUS | Status: AC | PRN
  Administered 2018-11-17: 28 via INTRAVENOUS

## 2018-11-18 ENCOUNTER — Telehealth: Payer: Self-pay | Admitting: *Deleted

## 2018-11-18 NOTE — Telephone Encounter (Signed)
Notes recorded by Laurine Blazer, LPN on 4/0/8144 at 8:18 PM EST Patient notified. Copy to pmd. Follow up scheduled for tomorrow with Dr. Bronson Ing in the [place] office.   ------  Notes recorded by Herminio Commons, MD on 11/17/2018 at 4:00 PM EST Possibility of mild blockage but soft tissue shadowing is also contributing to image interpretation. Overall it is a low risk study.

## 2018-11-19 ENCOUNTER — Ambulatory Visit: Payer: Medicare HMO | Admitting: Cardiovascular Disease

## 2018-11-19 ENCOUNTER — Encounter: Payer: Self-pay | Admitting: Cardiovascular Disease

## 2018-11-19 VITALS — BP 138/74 | HR 83 | Ht 70.0 in | Wt 234.0 lb

## 2018-11-19 DIAGNOSIS — Z955 Presence of coronary angioplasty implant and graft: Secondary | ICD-10-CM

## 2018-11-19 DIAGNOSIS — I10 Essential (primary) hypertension: Secondary | ICD-10-CM

## 2018-11-19 DIAGNOSIS — I25118 Atherosclerotic heart disease of native coronary artery with other forms of angina pectoris: Secondary | ICD-10-CM

## 2018-11-19 DIAGNOSIS — E785 Hyperlipidemia, unspecified: Secondary | ICD-10-CM

## 2018-11-19 DIAGNOSIS — Z0289 Encounter for other administrative examinations: Secondary | ICD-10-CM

## 2018-11-19 NOTE — Patient Instructions (Addendum)
Medication Instructions:  Your physician recommends that you continue on your current medications as directed. Please refer to the Current Medication list given to you today.  If you need a refill on your cardiac medications before your next appointment, please call your pharmacy.   Lab work: None today If you have labs (blood work) drawn today and your tests are completely normal, you will receive your results only by: Marland Kitchen MyChart Message (if you have MyChart) OR . A paper copy in the mail If you have any lab test that is abnormal or we need to change your treatment, we will call you to review the results.  Testing/Procedures: None today  Follow-Up: At [facility], you and your health needs are our priority.  As part of our continuing mission to provide you with exceptional heart care, we have created designated Provider Care Teams.  These Care Teams include your primary Cardiologist (physician) and Advanced Practice Providers (APPs -  Physician Assistants and Nurse Practitioners) who all work together to provide you with the care you need, when you need it. You will need a follow up appointment in 12 months.  Please call our office 2 months in advance to schedule this appointment.  You may see Kate Sable, MD or one of the following Advanced Practice Providers on your designated Care Team:   Bernerd Pho, PA-C [facility]) . Ermalinda Barrios, PA-C ([place])  Any Other Special Instructions Will Be Listed Below (If Applicable). None  Addendum: faxed MD office note to [phone]

## 2018-11-19 NOTE — Progress Notes (Signed)
SUBJECTIVE: The patient presents for follow-up of coronary artery disease with prior percutaneous coronary intervention(angioplasty, no stent)in the early 1990's, hypertension, and dyslipidemia. He has been intolerant to multiple cholesterol medications.  He underwent a low risk nuclear stress test on 11/18/2018.  There was soft tissue attenuation with potentially a mild degree of ischemia.  LVEF 54%.  ECG performed in the office today which I ordered and personally interpreted demonstrates normal sinus rhythm with right bundle branch block and left anterior fascicular block.  He is doing well and denies chest pain, palpitations, leg swelling, and shortness of breath.  He expressed his gratefulness for the care I have provided him with.  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  He has traveled to all 50 states and all [place].  His father was a Theme park manager in [place].   Review of Systems: As per "subjective", otherwise negative.  Allergies  Allergen Reactions  . Simvastatin Other (See Comments)    dizziness    Current Outpatient Medications  Medication Sig Dispense Refill  . aspirin EC 81 MG tablet Take 81 mg by mouth daily.    Marland Kitchen glipiZIDE (GLUCOTROL) 5 MG tablet Take by mouth 2 (two) times daily before a meal.    . lisinopril (PRINIVIL,ZESTRIL) 5 MG tablet Take 5 mg by mouth daily.    . metoprolol succinate (TOPROL-XL) 50 MG 24 hr tablet TAKE 1 & 1/2 (ONE & ONE-HALF) TABLETS BY MOUTH ONCE DAILY WITH OR IMMEDIATELY FOLLOWING A MEAL 135 tablet 3  . Multiple Vitamin (MULTIVITAMIN WITH MINERALS) TABS tablet Take 1 tablet by mouth daily.    . Omega-3 Fatty Acids (FISH OIL PO) Take 4 capsules by mouth See admin instructions. Take 2 capsules in the morning, 2 in the evening.    Marland Kitchen omeprazole (PRILOSEC) 20 MG capsule Take 20 mg by mouth daily.   2  . OVER THE COUNTER MEDICATION Take 1 tablet by mouth daily. Virmax Daily  Testosterone Booster. Take 1 tablet daily    . tamsulosin (FLOMAX) 0.4 MG CAPS capsule Take 0.4 mg by mouth at bedtime.     . meclizine (ANTIVERT) 25 MG tablet Take 25 mg by mouth 3 (three) times daily as needed for dizziness.     No current facility-administered medications for this visit.     Past Medical History:  Diagnosis Date  . CAD (coronary artery disease)   . Cancer ([place])   . Diabetes ([place])   . Dyslipidemia   . Gout   . HTN (hypertension)     Past Surgical History:  Procedure Laterality Date  . BACK SURGERY    . left knee surgery    . left shoulder surgery    . right knee surgery      Social History   Socioeconomic History  . Marital status: Married    Spouse name: Not on file  . Number of children: Not on file  . Years of education: Not on file  . Highest education level: Not on file  Occupational History  . Not on file  Social Needs  . Financial resource strain: Not on file  . Food insecurity:    Worry: Not on file    Inability: Not on file  . Transportation needs:    Medical: Not on file    Non-medical: Not on file  Tobacco Use  . Smoking status: Former Smoker    Packs/day: 5.00  Years: 36.00    Pack years: 180.00    Types: Cigarettes    Start date: 11/24/1952    Last attempt to quit: 11/24/1988    Years since quitting: 30.0  . Smokeless tobacco: Current User    Types: Snuff  Substance and Sexual Activity  . Alcohol use: No    Alcohol/week: 0.0 standard drinks  . Drug use: No  . Sexual activity: Not on file  Lifestyle  . Physical activity:    Days per week: Not on file    Minutes per session: Not on file  . Stress: Not on file  Relationships  . Social connections:    Talks on phone: Not on file    Gets together: Not on file    Attends religious service: Not on file    Active member of club or organization: Not on file    Attends meetings of clubs or organizations: Not on file    Relationship status: Not on file  . Intimate partner  violence:    Fear of current or ex partner: Not on file    Emotionally abused: Not on file    Physically abused: Not on file    Forced sexual activity: Not on file  Other Topics Concern  . Not on file  Social History Narrative  . Not on file     Vitals:   11/19/18 1313  BP: 138/74  Pulse: 83  SpO2: 97%  Weight: 234 lb (106.1 kg)  Height: 5\' 10"  (1.778 m)    Wt Readings from Last 3 Encounters:  11/19/18 234 lb (106.1 kg)  06/27/18 230 lb (104.3 kg)  11/18/17 233 lb (105.7 kg)     PHYSICAL EXAM General: NAD HEENT: Normal. Neck: No JVD, no thyromegaly. Lungs: Clear to auscultation bilaterally with normal respiratory effort. CV: Regular rate and rhythm, normal S1/S2, no S3/S4, no murmur. No pretibial or periankle edema.  No carotid bruit.   Abdomen: Soft, nontender, no distention.  Neurologic: Alert and oriented.  Psych: Normal affect. Skin: Normal. Musculoskeletal: No gross deformities.    ECG: Reviewed above under Subjective   Labs: Lab Results  Component Value Date/Time   K 3.9 06/27/2018 08:10 PM   BUN 16 06/27/2018 08:10 PM   CREATININE 0.83 06/27/2018 08:10 PM   ALT 20 06/27/2018 08:10 PM   HGB 14.6 06/27/2018 08:10 PM     Lipids: No results found for: LDLCALC, LDLDIRECT, CHOL, TRIG, HDL     ASSESSMENT AND PLAN:  1. CAD with prior PCI: Symptomatically stable. Continue ASA and metoprolol. He has been intolerant of statin therapy.  Low risk nuclear stress test on 11/18/2018.  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  2. Essential PRF:FMBW controlled. No changes.  3. Dyslipidemia: Hepreviouslystopped Crestor due to side effects. He has been intolerant of multiple statins. I will try to obtain a copy of lipids from PCP as he may be a candidate for PCSK9 inhibitors.  We discussed this.   Disposition: Follow up 1 year.   Kate Sable, M.D., F.A.C.C.

## 2019-07-02 ENCOUNTER — Other Ambulatory Visit: Payer: Self-pay | Admitting: Unknown Physician Specialty

## 2019-07-02 ENCOUNTER — Other Ambulatory Visit (HOSPITAL_COMMUNITY): Payer: Self-pay | Admitting: Unknown Physician Specialty

## 2019-07-02 DIAGNOSIS — R519 Headache, unspecified: Secondary | ICD-10-CM

## 2019-07-02 DIAGNOSIS — R42 Dizziness and giddiness: Secondary | ICD-10-CM

## 2019-07-09 ENCOUNTER — Ambulatory Visit (HOSPITAL_COMMUNITY): Payer: Medicare HMO

## 2019-07-09 ENCOUNTER — Encounter (HOSPITAL_COMMUNITY): Payer: Self-pay

## 2019-07-29 IMAGING — CT CT ABD-PELV W/ CM
2 of 5 series · 16 of 46 positions shown, 18 images · IV contrast (Isovue)
Comparison: None.

CLINICAL DATA: Patient with left flank pain, nausea vomiting and
diarrhea.

EXAM:
CT ABDOMEN AND PELVIS WITH CONTRAST
TECHNIQUE: Multidetector CT imaging of the abdomen and pelvis was performed
using the standard protocol following bolus administration of
intravenous contrast.
CONTRAST:  100mL VFQCMD-FLL IOPAMIDOL (VFQCMD-FLL) INJECTION 61%

[Series 2: axial st · axial · 0.80mm/px · z∈[+1096,+1521]mm · 13 of 97 slices shown, 15 images]
[im 6/97  soft-tissue]
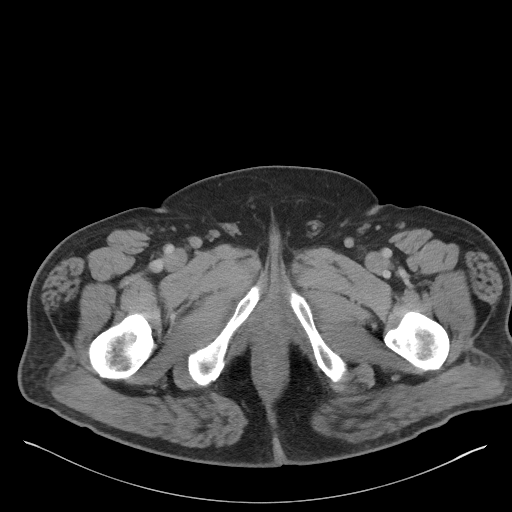
[im 6/97  bone]
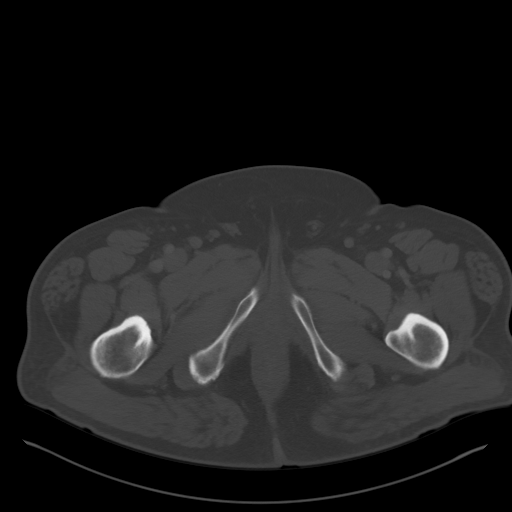
[im 16/97  soft-tissue]
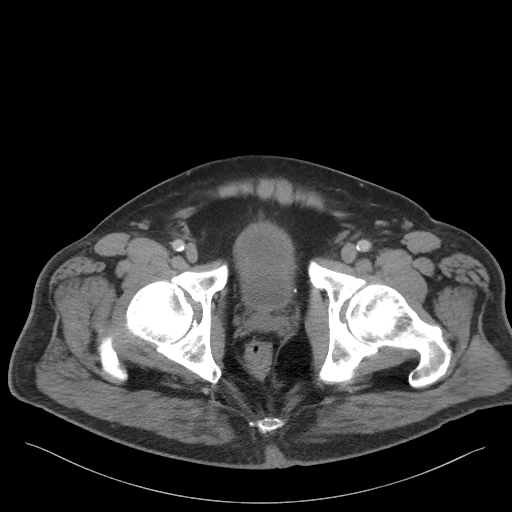
[im 21/97  soft-tissue]
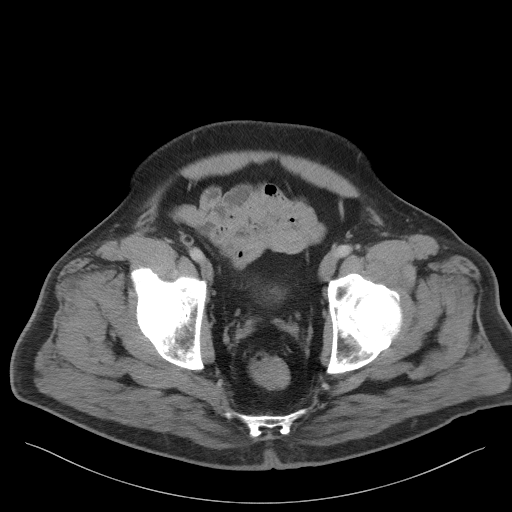
[im 26/97  soft-tissue]
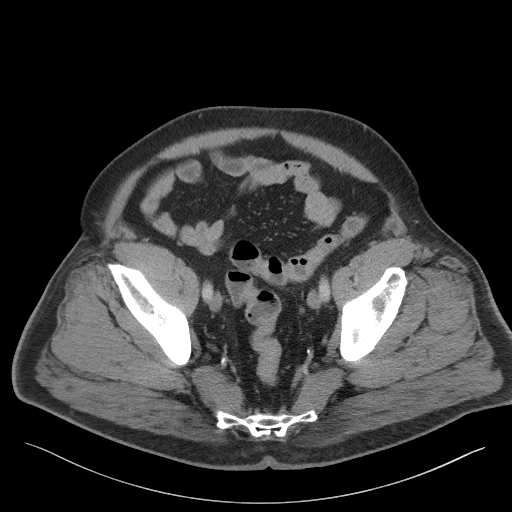
[im 36/97  soft-tissue]
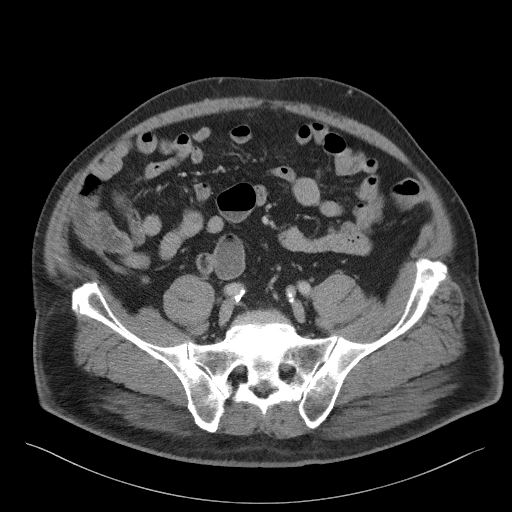
[im 41/97  soft-tissue]
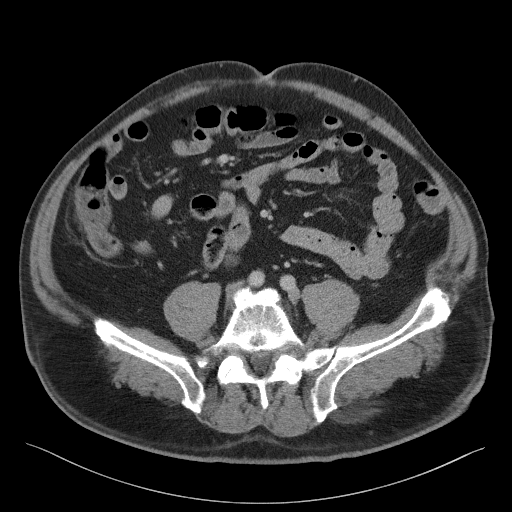
[im 51/97  soft-tissue]
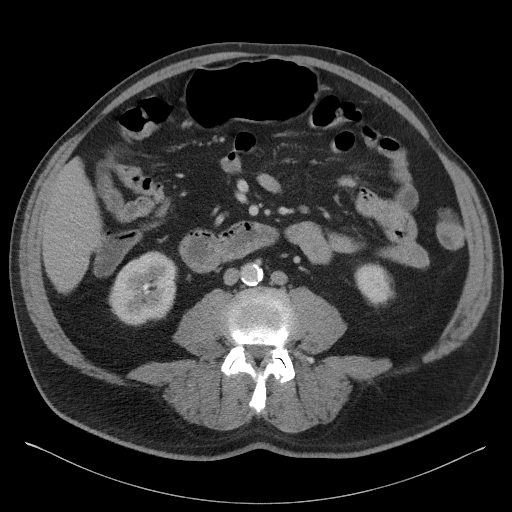
[im 56/97  soft-tissue]
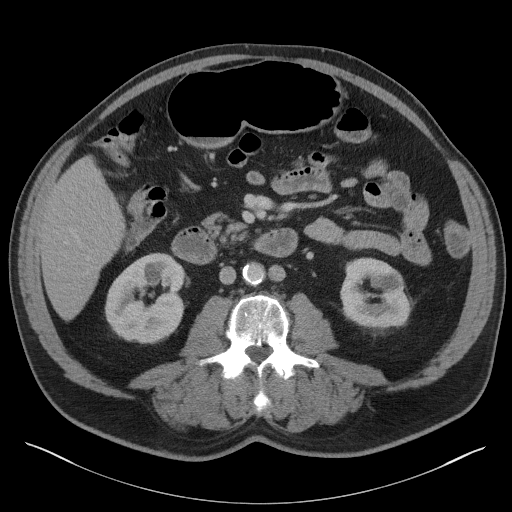
[im 61/97  soft-tissue]
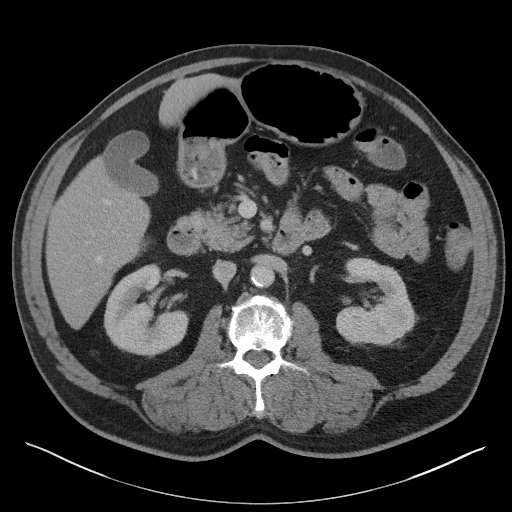
[im 61/97  bone]
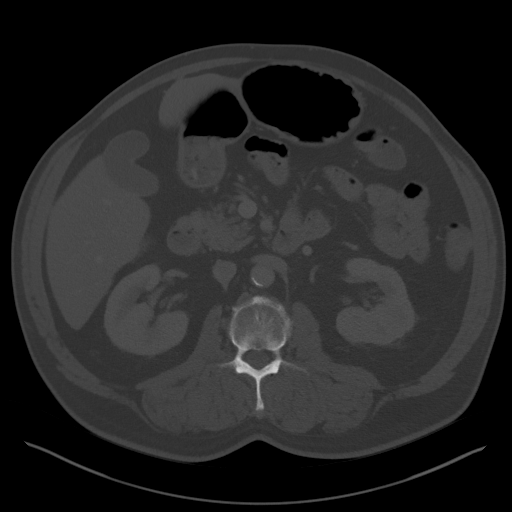
[im 71/97  soft-tissue]
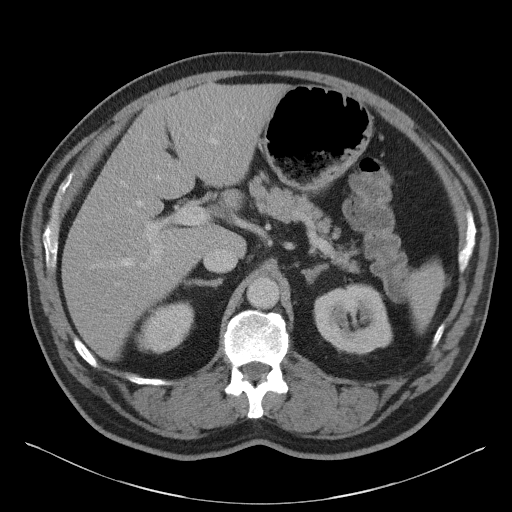
[im 76/97  soft-tissue]
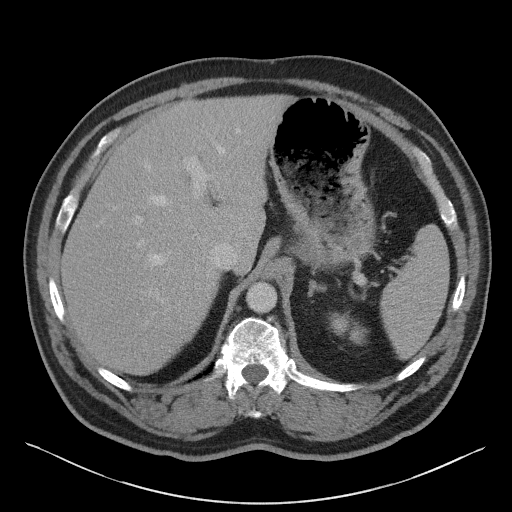
[im 81/97  soft-tissue]
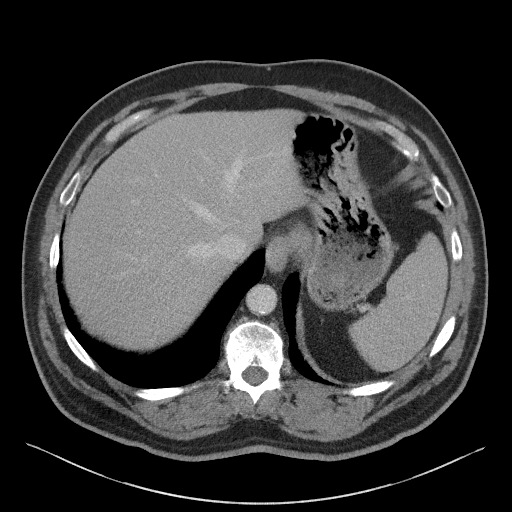
[im 91/97  soft-tissue]
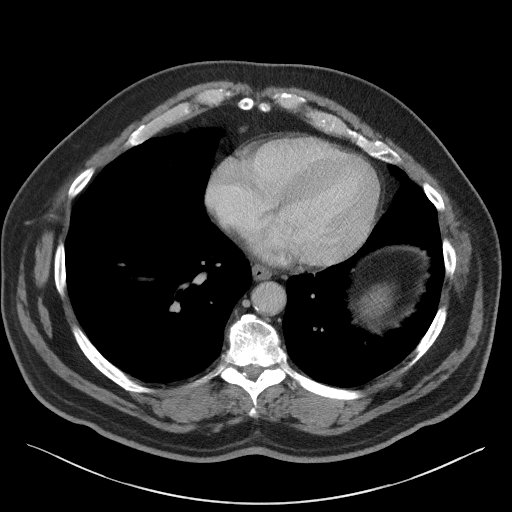

[Series 5: coronal st · coronal · 0.84mm/px · 3 of 115 slices shown]
[im 39/115  soft-tissue]
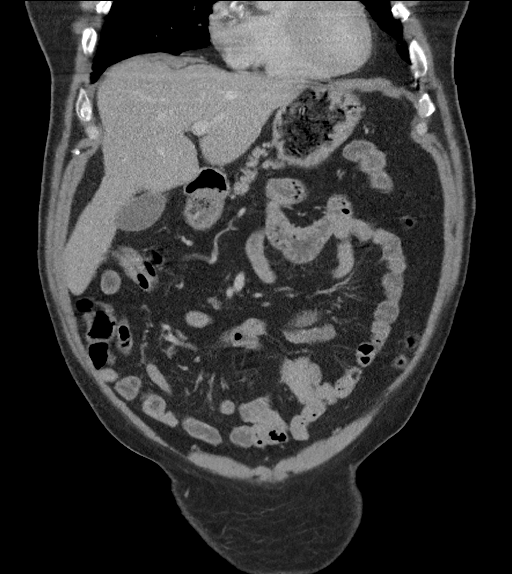
[im 51/115  soft-tissue]
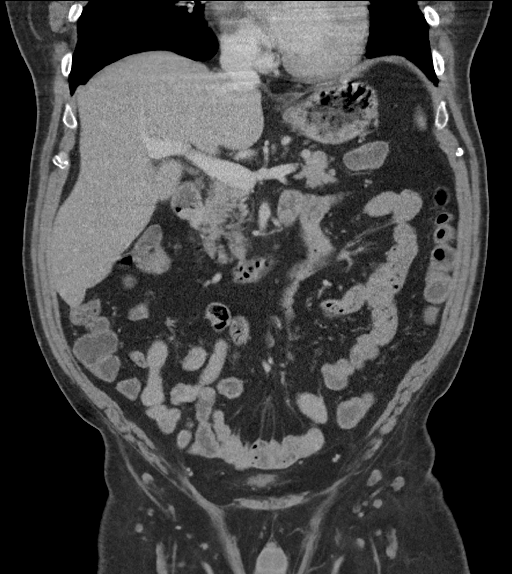
[im 64/115  soft-tissue]
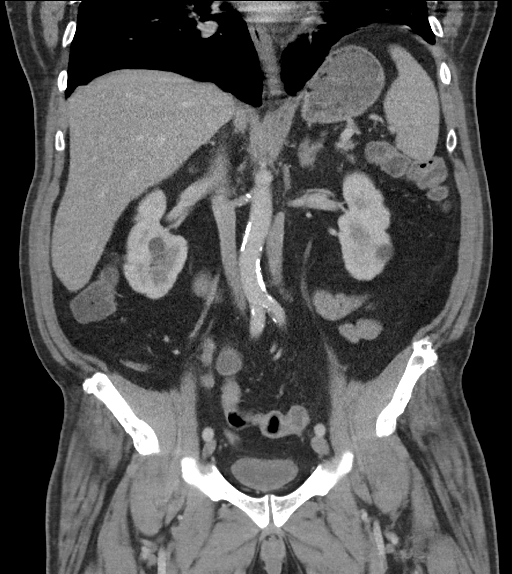

[16 of 46 positions shown; findings below may reference images not displayed]

FINDINGS: Lower chest: Normal heart size. Dependent atelectasis. No pleural
effusion.

Hepatobiliary: Liver is normal in size and contour. No focal hepatic
lesion is identified. Gallbladder is unremarkable. No intrahepatic
or extrahepatic biliary ductal dilatation.

Pancreas: Unremarkable

Spleen: Unremarkable

Adrenals/Urinary Tract: Normal adrenal glands. Kidneys enhance
symmetrically with contrast. 2 cm cyst inferior pole left kidney.
There is a 1.6 cm stone inferior pole right kidney. No
ureterolithiasis. No hydronephrosis. Urinary bladder is
unremarkable.

Stomach/Bowel: No abnormal bowel wall thickening or evidence for
bowel obstruction. No free fluid or free intraperitoneal air. Normal
appendix. Normal morphology of the stomach.

Vascular/Lymphatic: Normal caliber abdominal aorta. Peripheral
calcified atherosclerotic plaque. Duplicated inferior vena cava. No
retroperitoneal lymphadenopathy.

Reproductive: Prostate is unremarkable.

Other: Small fat containing left inguinal hernia.

Musculoskeletal: Lumbar spine degenerative changes. No aggressive or
acute appearing osseous lesions.
IMPRESSION: No acute process within the abdomen or pelvis.

## 2019-09-21 ENCOUNTER — Other Ambulatory Visit: Payer: Self-pay | Admitting: Neurological Surgery

## 2019-10-15 NOTE — Progress Notes (Signed)
[name] [place] [address] Phone: [phone] Fax: [phone]      Your procedure is scheduled on Wednesday, December 9th, 2020.   Report to [facility] Main Entrance "A" at 10:30 A.M., and check in at the Admitting office.   Call this number if you have problems the morning of surgery:  [phone]  Call [phone] if you have any questions prior to your surgery date Monday-Friday 8am-4pm    Remember:  Do not eat or drink after midnight the night before your surgery   Take these medicines the morning of surgery with A SIP OF WATER :  Meclizine (Antivert) - if needed Metoprolol Succinate (Toprol XL) Omeprazole (Prilosec) Ondansetron (Zofran) - if needed Tramadol (Ultram) - if needed  7 days prior to surgery STOP taking any Aspirin (unless otherwise instructed by your surgeon), Aleve, Naproxen, Ibuprofen, Motrin, Advil, Goody's, BC's, all herbal medications, fish oil, and all vitamins.    The Morning of Surgery  Do not wear jewelry, make-up or nail polish.  Do not wear lotions, powders, or perfumes/colognes, or deodorant  Do not shave 48 hours prior to surgery.  Men may shave face and neck.  Do not bring valuables to the hospital.  [facility] is not responsible for any belongings or valuables.  If you are a smoker, DO NOT Smoke 24 hours prior to surgery  If you wear a CPAP at night please bring your mask, tubing, and machine the morning of surgery   Remember that you must have someone to transport you home after your surgery, and remain with you for 24 hours if you are discharged the same day.   Please bring cases for contacts, glasses, hearing aids, dentures or bridgework because it cannot be worn into surgery.    Leave your suitcase in the car.  After surgery it may be brought to your room.  For patients admitted to the hospital, discharge time will be determined by your treatment  team.  Patients discharged the day of surgery will not be allowed to drive home.    Special instructions:   [place]- Preparing For Surgery  Before surgery, you can play an important role. Because skin is not sterile, your skin needs to be as free of germs as possible. You can reduce the number of germs on your skin by washing with CHG (chlorahexidine gluconate) Soap before surgery.  CHG is an antiseptic cleaner which kills germs and bonds with the skin to continue killing germs even after washing.    Oral Hygiene is also important to reduce your risk of infection.  Remember - BRUSH YOUR TEETH THE MORNING OF SURGERY WITH YOUR REGULAR TOOTHPASTE  Please do not use if you have an allergy to CHG or antibacterial soaps. If your skin becomes reddened/irritated stop using the CHG.  Do not shave (including legs and underarms) for at least 48 hours prior to first CHG shower. It is OK to shave your face.  Please follow these instructions carefully.   1. Shower the NIGHT BEFORE SURGERY and the MORNING OF SURGERY with CHG Soap.   2. If you chose to wash your hair, wash your hair first as usual with your normal shampoo.  3. After you shampoo, rinse your hair and body thoroughly to remove the shampoo.  4. Use CHG as you would any other liquid soap. You can apply CHG directly to the skin and wash gently with a scrungie  or a clean washcloth.   5. Apply the CHG Soap to your body ONLY FROM THE NECK DOWN.  Do not use on open wounds or open sores. Avoid contact with your eyes, ears, mouth and genitals (private parts). Wash Face and genitals (private parts)  with your normal soap.   6. Wash thoroughly, paying special attention to the area where your surgery will be performed.  7. Thoroughly rinse your body with warm water from the neck down.  8. DO NOT shower/wash with your normal soap after using and rinsing off the CHG Soap.  9. Pat yourself dry with a CLEAN TOWEL.  10. Wear CLEAN PAJAMAS to bed  the night before surgery, wear comfortable clothes the morning of surgery  11. Place CLEAN SHEETS on your bed the night of your first shower and DO NOT SLEEP WITH PETS.    Day of Surgery:  Please shower the morning of surgery with the CHG soap Do not apply any deodorants/lotions. Please wear clean clothes to the hospital/surgery center.   Remember to brush your teeth WITH YOUR REGULAR TOOTHPASTE.   Please read over the following fact sheets that you were given.

## 2019-10-18 ENCOUNTER — Encounter (HOSPITAL_COMMUNITY)
Admission: RE | Admit: 2019-10-18 | Discharge: 2019-10-18 | Disposition: A | Discharge status: Home / Self Care | Payer: Medicare HMO | Source: Ambulatory Visit | Attending: Neurological Surgery | Admitting: Neurological Surgery

## 2019-10-18 ENCOUNTER — Encounter (HOSPITAL_COMMUNITY): Payer: Self-pay

## 2019-10-18 ENCOUNTER — Other Ambulatory Visit: Payer: Self-pay

## 2019-10-18 ENCOUNTER — Other Ambulatory Visit (HOSPITAL_COMMUNITY)
Admission: RE | Admit: 2019-10-18 | Discharge: 2019-10-18 | Disposition: A | Discharge status: Home / Self Care | Payer: Medicare HMO | Source: Ambulatory Visit | Attending: Neurological Surgery | Admitting: Neurological Surgery

## 2019-10-18 DIAGNOSIS — I452 Bifascicular block: Secondary | ICD-10-CM | POA: Insufficient documentation

## 2019-10-18 DIAGNOSIS — Z7982 Long term (current) use of aspirin: Secondary | ICD-10-CM | POA: Insufficient documentation

## 2019-10-18 DIAGNOSIS — M5000 Cervical disc disorder with myelopathy, unspecified cervical region: Secondary | ICD-10-CM | POA: Insufficient documentation

## 2019-10-18 DIAGNOSIS — I2510 Atherosclerotic heart disease of native coronary artery without angina pectoris: Secondary | ICD-10-CM | POA: Diagnosis not present

## 2019-10-18 DIAGNOSIS — I4510 Unspecified right bundle-branch block: Secondary | ICD-10-CM | POA: Insufficient documentation

## 2019-10-18 DIAGNOSIS — Z79899 Other long term (current) drug therapy: Secondary | ICD-10-CM | POA: Diagnosis not present

## 2019-10-18 DIAGNOSIS — Z01812 Encounter for preprocedural laboratory examination: Secondary | ICD-10-CM | POA: Insufficient documentation

## 2019-10-18 HISTORY — DX: Acute myocardial infarction, unspecified: I21.9

## 2019-10-18 HISTORY — DX: Personal history of urinary calculi: Z87.442

## 2019-10-18 LAB — BASIC METABOLIC PANEL
Anion gap: 11 (ref 5–15)
BUN: 16 mg/dL (ref 8–23)
CO2: 24 mmol/L (ref 22–32)
Calcium: 9 mg/dL (ref 8.9–10.3)
Chloride: 101 mmol/L (ref 98–111)
Creatinine, Ser: 0.82 mg/dL (ref 0.61–1.24)
GFR calc Af Amer: >60 mL/min (ref >60)
GFR calc non Af Amer: >60 mL/min (ref >60)
Glucose, Bld: 168 mg/dL — ABNORMAL HIGH (ref 70–99)
Potassium: 4.1 mmol/L (ref 3.5–5.1)
Sodium: 136 mmol/L (ref 135–145)

## 2019-10-18 LAB — TYPE AND SCREEN
ABO/RH(D): A POS
Antibody Screen: NEGATIVE

## 2019-10-18 LAB — CBC
HCT: 41.2 % (ref 39.0–52.0)
Hemoglobin: 13.4 g/dL (ref 13.0–17.0)
MCH: 28 pg (ref 26.0–34.0)
MCHC: 32.5 g/dL (ref 30.0–36.0)
MCV: 86 fL (ref 80.0–100.0)
Platelets: 220 10*3/uL (ref 150–400)
RBC: 4.79 MIL/uL (ref 4.22–5.81)
RDW: 14.6 % (ref 11.5–15.5)
WBC: 7.4 10*3/uL (ref 4.0–10.5)
nRBC: 0 % (ref 0.0–0.2)

## 2019-10-18 LAB — GLUCOSE, CAPILLARY: Glucose-Capillary: 155 mg/dL — ABNORMAL HIGH (ref 70–99)

## 2019-10-18 LAB — ABO/RH: ABO/RH(D): A POS

## 2019-10-18 LAB — HEMOGLOBIN A1C
Hgb A1c MFr Bld: 7.3 % — ABNORMAL HIGH (ref 4.8–5.6)
Mean Plasma Glucose: 162.81 mg/dL

## 2019-10-18 LAB — SARS CORONAVIRUS 2 (TAT 6-24 HRS): SARS Coronavirus 2: NEGATIVE

## 2019-10-18 NOTE — Progress Notes (Signed)
PCP:  Dr. Ledora Bottcher, MD Cardiologist:  Yes, but patient does not know name or practice  EKG:  11/19/18 CXR:  N/A ECHO:  Pt states he has had within the last year Stress Test: Pt states he has had within the last year Cardiac Cath:  1988  Fasting Blood Sugar-  87-15- Checks Blood Sugar_3__ times a day  Covid test 10/18/19  Patient denies shortness of breath, fever, cough, and chest pain at PAT appointment.  Patient verbalized understanding of instructions provided today at the PAT appointment.  Patient asked to review instructions at home and day of surgery.

## 2019-10-18 NOTE — Progress Notes (Signed)
Lab called and said PCR unable to result.  Test would not run.  Need to collect PCR DOS.

## 2019-10-19 NOTE — Anesthesia Preprocedure Evaluation (Addendum)
Anesthesia Evaluation  Patient identified by MRN, date of birth, ID band Patient awake    Reviewed: Allergy & Precautions, NPO status , Patient's Chart, lab work & pertinent test results  Airway Mallampati: II  TM Distance: >3 FB Neck ROM: Full    Dental  (+) Edentulous Upper, Edentulous Lower   Pulmonary neg pulmonary ROS, former smoker,    Pulmonary exam normal breath sounds clear to auscultation       Cardiovascular hypertension, Pt. on medications + CAD, + Past MI and + Cardiac Stents  Normal cardiovascular exam Rhythm:Regular Rate:Normal     Neuro/Psych negative neurological ROS  negative psych ROS   GI/Hepatic negative GI ROS, Neg liver ROS,   Endo/Other  negative endocrine ROSdiabetes, Type 2, Oral Hypoglycemic Agents  Renal/GU negative Renal ROS  negative genitourinary   Musculoskeletal negative musculoskeletal ROS (+)   Abdominal (+) + obese,   Peds negative pediatric ROS (+)  Hematology negative hematology ROS (+)   Anesthesia Other Findings   Reproductive/Obstetrics negative OB ROS                           Anesthesia Physical Anesthesia Plan  ASA: III  Anesthesia Plan: General   Post-op Pain Management:    Induction: Intravenous  PONV Risk Score and Plan: 2 and Ondansetron, Midazolam and Treatment may vary due to age or medical condition  Airway Management Planned: Oral ETT  Additional Equipment:   Intra-op Plan:   Post-operative Plan: Extubation in OR  Informed Consent: I have reviewed the patients History and Physical, chart, labs and discussed the procedure including the risks, benefits and alternatives for the proposed anesthesia with the patient or authorized representative who has indicated his/her understanding and acceptance.     Dental advisory given  Plan Discussed with: CRNA  Anesthesia Plan Comments: (Follows yearly with cardiology for CAD withr  emote hx of coronary angioplasty in the early '90s. Last seen by Dr. Bronson Ing 11/19/18. Per note, "CAD with prior PCI: Symptomatically stable. Continue ASA and metoprolol. He has been intolerant of statin therapy.  Low risk nuclear stress test on 11/18/2018.  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 Labs otherwise WNL.   EKG 11/19/18: Sinus  Rhythm. Rate 77. 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   Nuclear stress 11/17/18: Defect 1: There is a medium defect of moderate severity present in the mid inferoseptal and mid inferior location. This is likely due to variable soft tissue attenuation. However, a mild dgree of ischemia cannot entirely be ruled out. This is a low risk study. Nuclear stress EF: 54%. RBBB and LAFB seen throughout study with resting mild nonspecific ST depressions.)       Anesthesia Quick Evaluation

## 2019-10-19 NOTE — Progress Notes (Signed)
Anesthesia Chart Review:  Follows yearly with cardiology for CAD withr emote hx of coronary angioplasty in the early '90s. Last seen by Dr. Bronson Ing 11/19/18. Per note, "CAD with prior PCI: Symptomatically stable. Continue ASA and metoprolol. He has been intolerant of statin therapy.  Low risk nuclear stress test on 11/18/2018.  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 Labs otherwise WNL.   EKG 11/19/18: Sinus  Rhythm. Rate 77. 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   Nuclear stress 11/17/18:  Defect 1: There is a medium defect of moderate severity present in the mid inferoseptal and mid inferior location. This is likely due to variable soft tissue attenuation. However, a mild dgree of ischemia cannot entirely be ruled out.  This is a low risk study.  Nuclear stress EF: 54%.  RBBB and LAFB seen throughout study with resting mild nonspecific ST depressions.   Wynonia Musty [facility] Short Stay Center/Anesthesiology Phone [phone] 10/19/2019 9:53 AM

## 2019-10-20 ENCOUNTER — Ambulatory Visit (HOSPITAL_COMMUNITY)
Admission: RE | Disposition: A | Discharge status: Home / Self Care | Payer: Self-pay | Source: Home / Self Care | Attending: Neurological Surgery

## 2019-10-20 ENCOUNTER — Ambulatory Visit (HOSPITAL_COMMUNITY): Payer: Medicare HMO

## 2019-10-20 ENCOUNTER — Ambulatory Visit (HOSPITAL_COMMUNITY): Payer: Medicare HMO | Admitting: Certified Registered"

## 2019-10-20 ENCOUNTER — Other Ambulatory Visit: Payer: Self-pay

## 2019-10-20 ENCOUNTER — Ambulatory Visit (HOSPITAL_COMMUNITY): Payer: Medicare HMO | Admitting: Physician Assistant

## 2019-10-20 ENCOUNTER — Encounter (HOSPITAL_COMMUNITY): Payer: Self-pay

## 2019-10-20 ENCOUNTER — Observation Stay (HOSPITAL_COMMUNITY)
Admission: RE | Admit: 2019-10-20 | Discharge: 2019-10-21 | Disposition: A | Discharge status: Home / Self Care | Payer: Medicare HMO | Attending: Neurological Surgery | Admitting: Neurological Surgery

## 2019-10-20 DIAGNOSIS — M5412 Radiculopathy, cervical region: Principal | ICD-10-CM | POA: Insufficient documentation

## 2019-10-20 DIAGNOSIS — Z7984 Long term (current) use of oral hypoglycemic drugs: Secondary | ICD-10-CM | POA: Insufficient documentation

## 2019-10-20 DIAGNOSIS — E119 Type 2 diabetes mellitus without complications: Secondary | ICD-10-CM | POA: Insufficient documentation

## 2019-10-20 DIAGNOSIS — I252 Old myocardial infarction: Secondary | ICD-10-CM | POA: Insufficient documentation

## 2019-10-20 DIAGNOSIS — Z888 Allergy status to other drugs, medicaments and biological substances status: Secondary | ICD-10-CM | POA: Diagnosis not present

## 2019-10-20 DIAGNOSIS — R2689 Other abnormalities of gait and mobility: Secondary | ICD-10-CM | POA: Insufficient documentation

## 2019-10-20 DIAGNOSIS — M2578 Osteophyte, vertebrae: Secondary | ICD-10-CM | POA: Diagnosis not present

## 2019-10-20 DIAGNOSIS — Z87891 Personal history of nicotine dependence: Secondary | ICD-10-CM | POA: Insufficient documentation

## 2019-10-20 DIAGNOSIS — Z8249 Family history of ischemic heart disease and other diseases of the circulatory system: Secondary | ICD-10-CM | POA: Diagnosis not present

## 2019-10-20 DIAGNOSIS — I10 Essential (primary) hypertension: Secondary | ICD-10-CM | POA: Insufficient documentation

## 2019-10-20 DIAGNOSIS — G9589 Other specified diseases of spinal cord: Secondary | ICD-10-CM | POA: Diagnosis not present

## 2019-10-20 DIAGNOSIS — Z419 Encounter for procedure for purposes other than remedying health state, unspecified: Secondary | ICD-10-CM

## 2019-10-20 DIAGNOSIS — I2510 Atherosclerotic heart disease of native coronary artery without angina pectoris: Secondary | ICD-10-CM | POA: Insufficient documentation

## 2019-10-20 DIAGNOSIS — Z79899 Other long term (current) drug therapy: Secondary | ICD-10-CM | POA: Insufficient documentation

## 2019-10-20 DIAGNOSIS — G959 Disease of spinal cord, unspecified: Secondary | ICD-10-CM | POA: Diagnosis present

## 2019-10-20 HISTORY — PX: ANTERIOR CERVICAL DECOMP/DISCECTOMY FUSION: SHX1161

## 2019-10-20 LAB — GLUCOSE, CAPILLARY
Glucose-Capillary: 131 mg/dL — ABNORMAL HIGH (ref 70–99)
Glucose-Capillary: 123 mg/dL — ABNORMAL HIGH (ref 70–99)
Glucose-Capillary: 252 mg/dL — ABNORMAL HIGH (ref 70–99)

## 2019-10-20 SURGERY — ANTERIOR CERVICAL DECOMPRESSION/DISCECTOMY FUSION 3 LEVELS
Anesthesia: General | Laterality: Left

## 2019-10-20 MED ORDER — CEFAZOLIN SODIUM-DEXTROSE 2-4 GM/100ML-% IV SOLN
2.0000 g | Freq: Three times a day (TID) | INTRAVENOUS | Status: AC
  Administered 2019-10-20 – 2019-10-21 (×2): 2 g via INTRAVENOUS
  Filled 2019-10-20 (×2): qty 100

## 2019-10-20 MED ORDER — PROPOFOL 10 MG/ML IV BOLUS
INTRAVENOUS | Status: DC | PRN
  Administered 2019-10-20: 150 mg via INTRAVENOUS

## 2019-10-20 MED ORDER — HYDROMORPHONE HCL 1 MG/ML IJ SOLN: 1.0000 mg | INTRAMUSCULAR | Status: DC | PRN

## 2019-10-20 MED ORDER — GLIPIZIDE 5 MG PO TABS
5.0000 mg | ORAL_TABLET | Freq: Two times a day (BID) | ORAL | Status: DC
  Administered 2019-10-21: 5 mg via ORAL
  Filled 2019-10-20: qty 1

## 2019-10-20 MED ORDER — CYCLOBENZAPRINE HCL 10 MG PO TABS
10.0000 mg | ORAL_TABLET | Freq: Three times a day (TID) | ORAL | Status: DC | PRN
  Administered 2019-10-20: 10 mg via ORAL
  Filled 2019-10-20: qty 1

## 2019-10-20 MED ORDER — CHLORHEXIDINE GLUCONATE CLOTH 2 % EX PADS: 6.0000 | MEDICATED_PAD | Freq: Once | CUTANEOUS | Status: DC

## 2019-10-20 MED ORDER — ROCURONIUM BROMIDE 50 MG/5ML IV SOSY
PREFILLED_SYRINGE | INTRAVENOUS | Status: DC | PRN
  Administered 2019-10-20: 20 mg via INTRAVENOUS
  Administered 2019-10-20: 30 mg via INTRAVENOUS
  Administered 2019-10-20: 20 mg via INTRAVENOUS
  Administered 2019-10-20: 100 mg via INTRAVENOUS
  Administered 2019-10-20: 20 mg via INTRAVENOUS

## 2019-10-20 MED ORDER — SUGAMMADEX SODIUM 200 MG/2ML IV SOLN
INTRAVENOUS | Status: DC | PRN
  Administered 2019-10-20: 200 mg via INTRAVENOUS

## 2019-10-20 MED ORDER — LACTATED RINGERS IV SOLN
INTRAVENOUS | Status: DC
  Administered 2019-10-20 (×2): via INTRAVENOUS

## 2019-10-20 MED ORDER — POLYETHYLENE GLYCOL 3350 17 G PO PACK: 17.0000 g | PACK | Freq: Every day | ORAL | Status: DC | PRN

## 2019-10-20 MED ORDER — ACETAMINOPHEN 325 MG PO TABS
650.0000 mg | ORAL_TABLET | ORAL | Status: DC | PRN
  Administered 2019-10-21: 650 mg via ORAL
  Filled 2019-10-20: qty 2

## 2019-10-20 MED ORDER — SODIUM CHLORIDE 0.9% FLUSH: 3.0000 mL | INTRAVENOUS | Status: DC | PRN

## 2019-10-20 MED ORDER — OXYCODONE HCL 5 MG PO TABS: 5.0000 mg | ORAL_TABLET | ORAL | Status: DC | PRN

## 2019-10-20 MED ORDER — PANTOPRAZOLE SODIUM 40 MG PO TBEC
40.0000 mg | DELAYED_RELEASE_TABLET | Freq: Every day | ORAL | Status: DC
  Administered 2019-10-21: 40 mg via ORAL
  Filled 2019-10-20: qty 1

## 2019-10-20 MED ORDER — FENTANYL CITRATE (PF) 250 MCG/5ML IJ SOLN
INTRAMUSCULAR | Status: AC
  Filled 2019-10-20: qty 5

## 2019-10-20 MED ORDER — OXYCODONE HCL 5 MG PO TABS
10.0000 mg | ORAL_TABLET | ORAL | Status: DC | PRN
  Administered 2019-10-20: 10 mg via ORAL
  Filled 2019-10-20 (×2): qty 2

## 2019-10-20 MED ORDER — METFORMIN HCL 500 MG PO TABS
1000.0000 mg | ORAL_TABLET | Freq: Every day | ORAL | Status: DC
  Administered 2019-10-21: 1000 mg via ORAL
  Filled 2019-10-20: qty 2

## 2019-10-20 MED ORDER — MENTHOL 3 MG MT LOZG: 1.0000 | LOZENGE | OROMUCOSAL | Status: DC | PRN

## 2019-10-20 MED ORDER — PHENYLEPHRINE HCL-NACL 10-0.9 MG/250ML-% IV SOLN
INTRAVENOUS | Status: DC | PRN
  Administered 2019-10-20: 30 ug/min via INTRAVENOUS

## 2019-10-20 MED ORDER — HYDROMORPHONE HCL 1 MG/ML IJ SOLN
INTRAMUSCULAR | Status: AC
  Filled 2019-10-20: qty 1

## 2019-10-20 MED ORDER — METOPROLOL SUCCINATE ER 50 MG PO TB24
75.0000 mg | ORAL_TABLET | Freq: Every day | ORAL | Status: DC
  Administered 2019-10-21: 75 mg via ORAL
  Filled 2019-10-20: qty 1

## 2019-10-20 MED ORDER — 0.9 % SODIUM CHLORIDE (POUR BTL) OPTIME
TOPICAL | Status: DC | PRN
  Administered 2019-10-20: 1000 mL

## 2019-10-20 MED ORDER — ONDANSETRON HCL 4 MG/2ML IJ SOLN
INTRAMUSCULAR | Status: DC | PRN
  Administered 2019-10-20: 4 mg via INTRAVENOUS

## 2019-10-20 MED ORDER — LIDOCAINE-EPINEPHRINE 1 %-1:100000 IJ SOLN
INTRAMUSCULAR | Status: AC
  Filled 2019-10-20: qty 1

## 2019-10-20 MED ORDER — LIDOCAINE 2% (20 MG/ML) 5 ML SYRINGE
INTRAMUSCULAR | Status: DC | PRN
  Administered 2019-10-20: 100 mg via INTRAVENOUS

## 2019-10-20 MED ORDER — OXYCODONE HCL 5 MG PO TABS: 5.0000 mg | ORAL_TABLET | Freq: Once | ORAL | Status: DC | PRN

## 2019-10-20 MED ORDER — LISINOPRIL 10 MG PO TABS
5.0000 mg | ORAL_TABLET | Freq: Every day | ORAL | Status: DC
  Administered 2019-10-21: 5 mg via ORAL
  Filled 2019-10-20: qty 1

## 2019-10-20 MED ORDER — ONDANSETRON HCL 4 MG/2ML IJ SOLN: 4.0000 mg | Freq: Four times a day (QID) | INTRAMUSCULAR | Status: DC | PRN

## 2019-10-20 MED ORDER — MIDAZOLAM HCL 2 MG/2ML IJ SOLN
INTRAMUSCULAR | Status: AC
  Filled 2019-10-20: qty 2

## 2019-10-20 MED ORDER — CEFAZOLIN SODIUM-DEXTROSE 2-4 GM/100ML-% IV SOLN
2.0000 g | INTRAVENOUS | Status: AC
  Administered 2019-10-20: 13:00:00 2 g via INTRAVENOUS
  Filled 2019-10-20: qty 100

## 2019-10-20 MED ORDER — HYDROMORPHONE HCL 1 MG/ML IJ SOLN
0.2500 mg | INTRAMUSCULAR | Status: DC | PRN
  Administered 2019-10-20 (×2): 0.25 mg via INTRAVENOUS

## 2019-10-20 MED ORDER — OXYCODONE HCL 5 MG/5ML PO SOLN: 5.0000 mg | Freq: Once | ORAL | Status: DC | PRN

## 2019-10-20 MED ORDER — SODIUM CHLORIDE 0.9 % IV SOLN: 250.0000 mL | INTRAVENOUS | Status: DC

## 2019-10-20 MED ORDER — ACETAMINOPHEN 650 MG RE SUPP: 650.0000 mg | RECTAL | Status: DC | PRN

## 2019-10-20 MED ORDER — SODIUM CHLORIDE 0.9% FLUSH: 3.0000 mL | Freq: Two times a day (BID) | INTRAVENOUS | Status: DC

## 2019-10-20 MED ORDER — MECLIZINE HCL 25 MG PO TABS
25.0000 mg | ORAL_TABLET | Freq: Three times a day (TID) | ORAL | Status: DC | PRN
  Filled 2019-10-20: qty 1

## 2019-10-20 MED ORDER — FENTANYL CITRATE (PF) 250 MCG/5ML IJ SOLN
INTRAMUSCULAR | Status: DC | PRN
  Administered 2019-10-20 (×2): 50 ug via INTRAVENOUS
  Administered 2019-10-20: 100 ug via INTRAVENOUS
  Administered 2019-10-20 (×2): 50 ug via INTRAVENOUS

## 2019-10-20 MED ORDER — PROPOFOL 10 MG/ML IV BOLUS
INTRAVENOUS | Status: AC
  Filled 2019-10-20: qty 40

## 2019-10-20 MED ORDER — DOCUSATE SODIUM 100 MG PO CAPS
100.0000 mg | ORAL_CAPSULE | Freq: Two times a day (BID) | ORAL | Status: DC
  Administered 2019-10-20 – 2019-10-21 (×2): 100 mg via ORAL
  Filled 2019-10-20 (×2): qty 1

## 2019-10-20 MED ORDER — SODIUM CHLORIDE 0.9 % IV SOLN
INTRAVENOUS | Status: DC | PRN
  Administered 2019-10-20: 500 mL

## 2019-10-20 MED ORDER — LIDOCAINE-EPINEPHRINE 1 %-1:100000 IJ SOLN
INTRAMUSCULAR | Status: DC | PRN
  Administered 2019-10-20: 10 mL

## 2019-10-20 MED ORDER — THROMBIN 5000 UNITS EX SOLR
CUTANEOUS | Status: AC
  Filled 2019-10-20: qty 5000

## 2019-10-20 MED ORDER — TAMSULOSIN HCL 0.4 MG PO CAPS
0.4000 mg | ORAL_CAPSULE | Freq: Every day | ORAL | Status: DC
  Administered 2019-10-20: 0.4 mg via ORAL
  Filled 2019-10-20: qty 1

## 2019-10-20 MED ORDER — MIDAZOLAM HCL 5 MG/5ML IJ SOLN
INTRAMUSCULAR | Status: DC | PRN
  Administered 2019-10-20: 2 mg via INTRAVENOUS

## 2019-10-20 MED ORDER — THROMBIN 5000 UNITS EX SOLR
OROMUCOSAL | Status: DC | PRN
  Administered 2019-10-20: 5 mL via TOPICAL

## 2019-10-20 MED ORDER — ONDANSETRON HCL 4 MG PO TABS: 4.0000 mg | ORAL_TABLET | Freq: Four times a day (QID) | ORAL | Status: DC | PRN

## 2019-10-20 MED ORDER — PROMETHAZINE HCL 25 MG/ML IJ SOLN: 6.2500 mg | INTRAMUSCULAR | Status: DC | PRN

## 2019-10-20 MED ORDER — PHENOL 1.4 % MT LIQD: 1.0000 | OROMUCOSAL | Status: DC | PRN

## 2019-10-20 SURGICAL SUPPLY — 64 items
ADH SKN CLS APL DERMABOND .7 (GAUZE/BANDAGES/DRESSINGS) ×1
APL SKNCLS STERI-STRIP NONHPOA (GAUZE/BANDAGES/DRESSINGS)
BAG DECANTER FOR FLEXI CONT (MISCELLANEOUS) ×3 IMPLANT
BENZOIN TINCTURE PRP APPL 2/3 (GAUZE/BANDAGES/DRESSINGS) IMPLANT
BLADE CLIPPER SURG (BLADE) IMPLANT
BLADE SURG 11 STRL SS (BLADE) ×3 IMPLANT
BUR MATCHSTICK NEURO 3.0 LAGG (BURR) ×3 IMPLANT
CANISTER SUCT 3000ML PPV (MISCELLANEOUS) ×3 IMPLANT
COVER WAND RF STERILE (DRAPES) ×1 IMPLANT
DECANTER SPIKE VIAL GLASS SM (MISCELLANEOUS) ×3 IMPLANT
DERMABOND ADVANCED (GAUZE/BANDAGES/DRESSINGS) ×2
DERMABOND ADVANCED .7 DNX12 (GAUZE/BANDAGES/DRESSINGS) ×1 IMPLANT
DRAPE C-ARM 42X72 X-RAY (DRAPES) ×6 IMPLANT
DRAPE HALF SHEET 40X57 (DRAPES) IMPLANT
DRAPE LAPAROTOMY 100X72 PEDS (DRAPES) ×3 IMPLANT
DRAPE MICROSCOPE LEICA (MISCELLANEOUS) ×3 IMPLANT
DURAPREP 6ML APPLICATOR 50/CS (WOUND CARE) ×3 IMPLANT
ELECT COATED BLADE 2.86 ST (ELECTRODE) ×3 IMPLANT
ELECT REM PT RETURN 9FT ADLT (ELECTROSURGICAL) ×3
ELECTRODE REM PT RTRN 9FT ADLT (ELECTROSURGICAL) ×1 IMPLANT
GAUZE 4X4 16PLY RFD (DISPOSABLE) IMPLANT
GLOVE BIO SURGEON STRL SZ 6.5 (GLOVE) ×5 IMPLANT
GLOVE BIO SURGEON STRL SZ7 (GLOVE) ×2 IMPLANT
GLOVE BIO SURGEON STRL SZ7.5 (GLOVE) ×3 IMPLANT
GLOVE BIO SURGEON STRL SZ8 (GLOVE) ×2 IMPLANT
GLOVE BIO SURGEONS STRL SZ 6.5 (GLOVE) ×5
GLOVE BIOGEL PI IND STRL 6.5 (GLOVE) IMPLANT
GLOVE BIOGEL PI IND STRL 7.5 (GLOVE) ×2 IMPLANT
GLOVE BIOGEL PI INDICATOR 6.5 (GLOVE) ×4
GLOVE BIOGEL PI INDICATOR 7.5 (GLOVE) ×2
GLOVE EXAM NITRILE LRG STRL (GLOVE) IMPLANT
GLOVE EXAM NITRILE XL STR (GLOVE) IMPLANT
GLOVE EXAM NITRILE XS STR PU (GLOVE) IMPLANT
GLOVE INDICATOR 8.5 STRL (GLOVE) ×2 IMPLANT
GOWN STRL REUS W/ TWL LRG LVL3 (GOWN DISPOSABLE) ×2 IMPLANT
GOWN STRL REUS W/ TWL XL LVL3 (GOWN DISPOSABLE) IMPLANT
GOWN STRL REUS W/TWL 2XL LVL3 (GOWN DISPOSABLE) IMPLANT
GOWN STRL REUS W/TWL LRG LVL3 (GOWN DISPOSABLE) ×6
GOWN STRL REUS W/TWL XL LVL3 (GOWN DISPOSABLE) ×3
HEMOSTAT POWDER KIT SURGIFOAM (HEMOSTASIS) ×3 IMPLANT
KIT BASIN OR (CUSTOM PROCEDURE TRAY) ×3 IMPLANT
KIT TURNOVER KIT B (KITS) ×3 IMPLANT
NDL SPNL 18GX3.5 QUINCKE PK (NEEDLE) ×1 IMPLANT
NEEDLE HYPO 22GX1.5 SAFETY (NEEDLE) ×3 IMPLANT
NEEDLE SPNL 18GX3.5 QUINCKE PK (NEEDLE) ×3 IMPLANT
NS IRRIG 1000ML POUR BTL (IV SOLUTION) ×3 IMPLANT
PACK LAMINECTOMY NEURO (CUSTOM PROCEDURE TRAY) ×3 IMPLANT
PAD ARMBOARD 7.5X6 YLW CONV (MISCELLANEOUS) ×11 IMPLANT
PIN DISTRACTION 14MM (PIN) IMPLANT
PLATE 3 57.5XLCK NS SPNE CVD (Plate) IMPLANT
PLATE 3 ATLANTIS TRANS (Plate) ×3 IMPLANT
RUBBERBAND STERILE (MISCELLANEOUS) ×6 IMPLANT
SCREW SELF TAP VAR 4.0X13 (Screw) ×16 IMPLANT
SPACER BONE CORNERSTONE 6X14 (Orthopedic Implant) ×2 IMPLANT
SPACER BONE CORNERSTONE 7X14 (Orthopedic Implant) ×4 IMPLANT
SPONGE INTESTINAL PEANUT (DISPOSABLE) ×5 IMPLANT
STAPLER VISISTAT 35W (STAPLE) IMPLANT
SUT MNCRL AB 3-0 PS2 18 (SUTURE) ×3 IMPLANT
SUT VIC AB 3-0 SH 8-18 (SUTURE) ×5 IMPLANT
TAPE CLOTH 3X10 TAN LF (GAUZE/BANDAGES/DRESSINGS) ×1 IMPLANT
TOWEL GREEN STERILE (TOWEL DISPOSABLE) ×3 IMPLANT
TOWEL GREEN STERILE FF (TOWEL DISPOSABLE) ×3 IMPLANT
TRAY FOLEY MTR SLVR 16FR STAT (SET/KITS/TRAYS/PACK) ×3 IMPLANT
WATER STERILE IRR 1000ML POUR (IV SOLUTION) ×3 IMPLANT

## 2019-10-20 NOTE — Anesthesia Procedure Notes (Signed)
Procedure Name: Intubation Date/Time: 10/20/2019 1:02 PM Performed by: Griffin Dakin, CRNA Pre-anesthesia Checklist: Patient identified, Emergency Drugs available, Suction available and Patient being monitored Patient Re-evaluated:Patient Re-evaluated prior to induction Oxygen Delivery Method: Circle system utilized Preoxygenation: Pre-oxygenation with 100% oxygen Induction Type: IV induction Ventilation: Two handed mask ventilation required and Oral airway inserted - appropriate to patient size Laryngoscope Size: Glidescope and 4 Grade View: Grade I Tube type: Oral Number of attempts: 1 Airway Equipment and Method: Oral airway,  Rigid stylet and Video-laryngoscopy Placement Confirmation: ETT inserted through vocal cords under direct vision,  positive ETCO2 and breath sounds checked- equal and bilateral Secured at: 25 cm Tube secured with: Tape Dental Injury: Teeth and Oropharynx as per pre-operative assessment  Comments: Elective use of glidescope due to symptomatic cervical issues.

## 2019-10-20 NOTE — Op Note (Signed)
PATIENT: [name] [name].  PROCEDURE DATE: 10/20/19  PRE-OPERATIVE DIAGNOSIS:  Cervical myeloradiculopathy   POST-OPERATIVE DIAGNOSIS:  Cervical myeloradiculopathy   PROCEDURE:  C3-C4, C4-5, C5-6 Anterior Cervical Discectomy and Instrumented Fusion   SURGEON:  Surgeon(s) and Role:    Judith Part, MD - Primary    Kary Kos, MD - Assisting   ANESTHESIA: ETGA   BRIEF HISTORY: This is a 73 year old man who presented with progressive right upper extremity radicular symptoms along with progressive balance issues and fine motor weakness in the hands. The patient was found to have three levels of cervical central canal and foraminal stenosis with some possible cord signal change. I therefore recommended a 3 level ACDF to prevent progression of his myelopathy as well as improve his radicular symptoms in the right upper extremity. This was discussed with the patient as well as risks, benefits, and alternatives and the patient wished to proceed with surgical treatment.   OPERATIVE DETAIL: The patient was taken to the operating room and placed on the OR table in the supine position. A formal time out was performed with two patient identifiers and confirmed the operative site. Anesthesia was induced by the anesthesia team.  Fluoroscopy was used to localize the surgical level and an incision was marked in a skin crease. The area was then prepped and draped in a sterile fashion. His prior incision was too far inferior to reach the superior levels, so I created a new incision superior to the prior. The platysma was divided and the sternocleidomastoid muscle was identified. The carotid sheath was palpated, identified, and retracted laterally with the sternocleidomastoid muscle. The strap muscles were identified and retracted medially and the pretracheal fascia was entered. A bent spinal needle was used with fluoroscopy to localize the surgical level after dissection. The longus colli were elevated  bilaterally and a self-retaining retractor was placed. The endotracheal tube cuff balloon was deflated and reinflated after retractor placement.   Anterior osteophytes were removed until flush with the anterior vertebral body. The disc annulus was incised and a complete C3-C4 discectomy was performed. The posterior longitudinal ligament was incised followed by ligamentous and bony removal until no central canal stenosis was present. Decompression was then taken out laterally into the bilateral foramina until no foraminal stenosis was palpable. A 37mm cortical allograft (Medtronic) was inserted into the disc space as an interbody graft.   The previously described discectomy technique was then repeated at C4-C5 with a 27mm cortical allograft (Medtronic) and C5-C6 with a 85mm cortical allograft (Medtronic).   An anterior plate (Medtronic) was positioned and 8, 95mm screws were used to secure the plate to the C3, C4, C5 and C6 vertebral bodies. Hemostasis was obtained and the incision was closed in layers. All instrument and sponge counts were correct. The patient was then returned to anesthesia for emergence. No apparent complications at the completion of the procedure.   EBL:  144mL   DRAINS: none   SPECIMENS: none   Judith Part, MD 10/20/19 12:43 PM

## 2019-10-20 NOTE — Transfer of Care (Signed)
Immediate Anesthesia Transfer of Care Note  Patient: [name] [name].  Procedure(s) Performed: Left Cervical Three-Four Cervical Four-Five Cervical Five-Six Anterior cervical decompression/discectomy/fusion (Left )  Patient Location: PACU  Anesthesia Type:General  Level of Consciousness: awake and oriented  Airway & Oxygen Therapy: Patient Spontanous Breathing  Post-op Assessment: Report given to RN  Post vital signs: Reviewed and stable  Last Vitals:  Vitals Value Taken Time  BP    Temp    Pulse 86 10/20/19 1649  Resp    SpO2 97 % 10/20/19 1649  Vitals shown include unvalidated device data.  Last Pain:  Vitals:   10/20/19 1102  TempSrc:   PainSc: 0-No pain         Complications: No apparent anesthesia complications

## 2019-10-20 NOTE — Brief Op Note (Signed)
10/20/2019  4:42 PM  PATIENT:  [name] [name].  73 y.o. male  PRE-OPERATIVE DIAGNOSIS:  Cervical myelopathy  POST-OPERATIVE DIAGNOSIS:  Cervical myelopathy  PROCEDURE:  Procedure(s) with comments: Left Cervical Three-Four Cervical Four-Five Cervical Five-Six Anterior cervical decompression/discectomy/fusion (Left) - Left Cervical Three-Four Cervical Four-Five Cervical Five-Six Anterior cervical decompression/discectomy/fusion  SURGEON:  Surgeon(s) and Role:    * Judith Part, MD - Primary Kary Kos - Assistant  PHYSICIAN ASSISTANT:   ANESTHESIA:   general  EBL:  100 mL   BLOOD ADMINISTERED:none  DRAINS: none   LOCAL MEDICATIONS USED:  LIDOCAINE   SPECIMEN:  No Specimen  DISPOSITION OF SPECIMEN:  N/A  COUNTS:  YES  TOURNIQUET:  * No tourniquets in log *  DICTATION: .Note written in EPIC  PLAN OF CARE: Admit for overnight observation  PATIENT DISPOSITION:  PACU - hemodynamically stable.   Delay start of Pharmacological VTE agent (>24hrs) due to surgical blood loss or risk of bleeding: yes

## 2019-10-20 NOTE — H&P (Signed)
Surgical H&P Update  HPI: 73 y.o. man with history of progressive RUE radicular symptoms and bilateral upper extremity symptoms of myelopathy, here for ACDF. He previously had a non-instrumented ACDF at the level below. No changes in health since she was last seen. Still having symptoms and wishes to proceed with surgery.  PMHx:  Past Medical History:  Diagnosis Date  . CAD (coronary artery disease)   . Cancer ([place])   . Diabetes ([place])   . Dyslipidemia   . Gout   . History of kidney stones   . HTN (hypertension)   . Myocardial infarction ([place])    FamHx:  Family History  Problem Relation Age of Onset  . Heart attack Father 19  . Colon cancer Brother 24   SocHx:  reports that he quit smoking about 30 years ago. His smoking use included cigarettes. He started smoking about 66 years ago. He has a 180.00 pack-year smoking history. His smokeless tobacco use includes snuff. He reports that he does not drink alcohol or use drugs.  Physical Exam: AOx3, PERRL, FS, TM  Strength 5/5 x4, SILTx4 except for right C6 and C7 distribution numbness  Assesment/Plan: 73 y.o. man with cervical myeloradiculopathy, here for C3-4/C4-5/C5-6 ACDF. Risks, benefits, and alternatives discussed and the patient would like to continue with surgery.  -OR today -3C post-op  Judith Part, MD 10/20/19 12:36 PM

## 2019-10-20 NOTE — Anesthesia Postprocedure Evaluation (Signed)
Anesthesia Post Note  Patient: [name] [name].  Procedure(s) Performed: Left Cervical Three-Four Cervical Four-Five Cervical Five-Six Anterior cervical decompression/discectomy/fusion (Left )     Patient location during evaluation: PACU Anesthesia Type: General Level of consciousness: awake and alert Pain management: pain level controlled Vital Signs Assessment: post-procedure vital signs reviewed and stable Respiratory status: spontaneous breathing, nonlabored ventilation, respiratory function stable and patient connected to nasal cannula oxygen Cardiovascular status: blood pressure returned to baseline and stable Postop Assessment: no apparent nausea or vomiting Anesthetic complications: no    Last Vitals:  Vitals:   10/20/19 1720 10/20/19 1804  BP: 119/64 (!) 152/74  Pulse: 72 74  Resp: 16 17  Temp:  36.7 C  SpO2: 94% 96%    Last Pain:  Vitals:   10/20/19 1810  TempSrc:   PainSc: 7     LLE Motor Response: Purposeful movement (10/20/19 1810) LLE Sensation: Full sensation (10/20/19 1810) RLE Motor Response: Purposeful movement (10/20/19 1810) RLE Sensation: Full sensation (10/20/19 1810)      [name] P Cathyrn Deas

## 2019-10-21 ENCOUNTER — Encounter: Payer: Self-pay | Admitting: *Deleted

## 2019-10-21 DIAGNOSIS — M5412 Radiculopathy, cervical region: Secondary | ICD-10-CM | POA: Diagnosis not present

## 2019-10-21 LAB — GLUCOSE, CAPILLARY: Glucose-Capillary: 203 mg/dL — ABNORMAL HIGH (ref 70–99)

## 2019-10-21 MED ORDER — CYCLOBENZAPRINE HCL 10 MG PO TABS: 10.0000 mg | ORAL_TABLET | Freq: Three times a day (TID) | ORAL | 0 refills | Status: DC | PRN

## 2019-10-21 MED ORDER — ASPIRIN EC 81 MG PO TBEC: 81.0000 mg | DELAYED_RELEASE_TABLET | Freq: Every day | ORAL | Status: DC

## 2019-10-21 MED ORDER — OXYCODONE HCL 5 MG PO TABS: 5.0000 mg | ORAL_TABLET | ORAL | 0 refills | Status: DC | PRN

## 2019-10-21 MED FILL — Gelatin Absorbable MT Powder: OROMUCOSAL | Qty: 1 | Status: CN

## 2019-10-21 MED FILL — Thrombin For Soln 5000 Unit: CUTANEOUS | Qty: 5000 | Status: CN

## 2019-10-21 NOTE — Evaluation (Signed)
Physical Therapy Evaluation Patient Details Name: [name] [name]. MRN: [id number] DOB: 11-15-1945 Today's Date: 10/21/2019   History of Present Illness  Pt is a 73 y/o male now s/p ACDF C3-6. Pt with PMHx including gout, HTN, MI, CAD, DM, back sx, shoulder sx, and knee sx   Clinical Impression  Pt admitted with above diagnosis. At the time of PT eval, pt was able to demonstrate transfers and ambulation with gross supervision for safety and a SPC for support. Pt reporting significant pain in R deltoid area - did not functionally limit pt during session however. See OT eval for more specific assessment of RUE. Pt was educated on precautions, appropriate activity progression, and car transfer. Pt currently with functional limitations due to the deficits listed below (see PT Problem List). Pt will benefit from skilled PT to increase their independence and safety with mobility to allow discharge to the venue listed below.      Follow Up Recommendations No PT follow up;Supervision for mobility/OOB    Equipment Recommendations  None recommended by PT    Recommendations for Other Services       Precautions / Restrictions Precautions Precautions: Cervical Precaution Booklet Issued: Yes (comment) Precaution Comments: reviewed with pt, cued throughout functional tasks Required Braces or Orthoses: Cervical Brace Cervical Brace: Soft collar;For comfort Restrictions Weight Bearing Restrictions: No      Mobility  Bed Mobility Overal bed mobility: Needs Assistance Bed Mobility: Rolling;Sidelying to Sit Rolling: Supervision Sidelying to sit: Supervision       General bed mobility comments: HOB slightly elevated; use of rails. Pt performed log roll fairly well without cues.  Transfers Overall transfer level: Needs assistance Equipment used: Straight cane Transfers: Sit to/from Stand Sit to Stand: Supervision         General transfer comment: Light supervision for safety with SPC.  Closer supervision without SPC.   Ambulation/Gait Ambulation/Gait assistance: Supervision Gait Distance (Feet): 400 Feet Assistive device: Straight cane Gait Pattern/deviations: Step-through pattern;Decreased stride length;Trunk flexed Gait velocity: Decreased Gait velocity interpretation: 1.31 - 2.62 ft/sec, indicative of limited community ambulator General Gait Details: Pt was able to use SPC on R side without complaints of pain in R shoulder. Pt mildly antialgic but overall steady without overt LOB.   Stairs            Wheelchair Mobility    Modified Rankin (Stroke Patients Only)       Balance Overall balance assessment: Mild deficits observed, not formally tested                                           Pertinent Vitals/Pain Pain Assessment: Faces Faces Pain Scale: Hurts even more Pain Location: R shoulder (deltoid area), incisional pain, bilateral lower legs (calf) Pain Descriptors / Indicators: Discomfort;Grimacing;Sore;Operative site guarding;Cramping Pain Intervention(s): Limited activity within patient's tolerance;Monitored during session;Repositioned    Home Living Family/patient expects to be discharged to:: Private residence Living Arrangements: Spouse/significant other Available Help at Discharge: Family;Available PRN/intermittently Type of Home: House Home Access: Ramped entrance     Home Layout: One level Home Equipment: Cane - single point;Walker - 2 wheels      Prior Function Level of Independence: Independent               Hand Dominance   Dominant Hand: Right    Extremity/Trunk Assessment   Upper Extremity Assessment Upper Extremity Assessment:  Defer to OT evaluation RUE Deficits / Details: pt with increased R shoulder pain s/p surgery, most notable with movements towards abduction, decreased fine motor pt reports due to gout RUE Coordination: decreased fine motor;decreased gross motor LUE Deficits / Details:  decreased fine motor pt reports due to gout LUE Coordination: decreased fine motor    Lower Extremity Assessment Lower Extremity Assessment: Generalized weakness    Cervical / Trunk Assessment Cervical / Trunk Assessment: Other exceptions Cervical / Trunk Exceptions: s/p cervical sx  Communication   Communication: No difficulties  Cognition Arousal/Alertness: Awake/alert Behavior During Therapy: WFL for tasks assessed/performed Overall Cognitive Status: Within Functional Limits for tasks assessed                                        General Comments      Exercises     Assessment/Plan    PT Assessment Patent does not need any further PT services  PT Problem List         PT Treatment Interventions      PT Goals (Current goals can be found in the Care Plan section)  Acute Rehab PT Goals Patient Stated Goal: home today, less shoulder pain PT Goal Formulation: All assessment and education complete, DC therapy    Frequency     Barriers to discharge        Co-evaluation               AM-PAC PT "6 Clicks" Mobility  Outcome Measure Help needed turning from your back to your side while in a flat bed without using bedrails?: None Help needed moving from lying on your back to sitting on the side of a flat bed without using bedrails?: None Help needed moving to and from a bed to a chair (including a wheelchair)?: None Help needed standing up from a chair using your arms (e.g., wheelchair or bedside chair)?: None Help needed to walk in hospital room?: None Help needed climbing 3-5 steps with a railing? : A Little 6 Click Score: 23    End of Session Equipment Utilized During Treatment: Cervical collar Activity Tolerance: Patient tolerated treatment well Patient left: in chair;with call bell/phone within reach Nurse Communication: Mobility status PT Visit Diagnosis: Unsteadiness on feet (R26.81);Pain;Other symptoms and signs involving the nervous  system (R29.898) Pain - part of body: Shoulder(neck)    [id number] PT Time Calculation (min) (ACUTE ONLY): 32 min   Charges:   PT Evaluation $PT Eval Low Complexity: 1 Low PT Treatments $Gait Training: 8-22 mins        Rolinda Roan, PT, DPT Acute Rehabilitation Services Pager: [phone] Office: [phone]   Thelma Comp 10/21/2019, 11:30 AM

## 2019-10-21 NOTE — Discharge Instructions (Signed)
Discharge Instructions ° °No restriction in activities, slowly increase your activity back to normal.  ° °Your incision is closed with dermabond (purple glue). This will naturally fall off over the next 1-2 weeks.  ° °Okay to shower on the day of discharge. Use regular soap and water and try to be gentle when cleaning your incision.  ° °Follow up with Dr. Lanijah Warzecha in 2 weeks after discharge. If you do not already have a discharge appointment, please call his office at [phone] to schedule a follow up appointment. If you have any concerns or questions, please call the office and let us know. °

## 2019-10-21 NOTE — Evaluation (Signed)
Occupational Therapy Evaluation Patient Details Name: [name] [name]. MRN: [id number] DOB: 26-Jun-1946 Today's Date: 10/21/2019    History of Present Illness Pt is a 73 y/o male now s/p ACDF C3-6. Pt with PMHx including gout, HTN, MI, CAD, DM, back sx, shoulder sx, and knee sx    Clinical Impression   This 73 y/o male presents with the above. PTA pt reports independence with ADL and functional mobility. Pt currently requiring minguard-supervision for functional transfers/mobility; requiring minA for LB and UB ADL. Pt with reports of R shoulder pain s/p surgery limiting his ability to perform UB ADL. Educated and further reviewed with pt re: cerviceal precautions, safety and compensatory techniques for completing ADL and functional transfers, min cues required for carry over of precautions. Pt reports plans to return home with spouse assist for ADL/iADL PRN. Education provided and questions answered throughout. Pt anticipating d/c home today.     Follow Up Recommendations  No OT follow up;Supervision/Assistance - 24 hour    Equipment Recommendations  None recommended by OT(pt declines needing seat for shower)           Precautions / Restrictions Precautions Precautions: Cervical Precaution Booklet Issued: Yes (comment) Precaution Comments: reviewed with pt, cued throughout functional tasks Required Braces or Orthoses: Cervical Brace Cervical Brace: Soft collar;For comfort Restrictions Weight Bearing Restrictions: No      Mobility Bed Mobility               General bed mobility comments: OOB in recliner  Transfers Overall transfer level: Needs assistance Equipment used: None Transfers: Sit to/from Stand Sit to Stand: Supervision         General transfer comment: for safety and balance, min safety cues for return to sitting as pt attempting to sit prematurely initially    Balance Overall balance assessment: Mild deficits observed, not formally tested                                         ADL either performed or assessed with clinical judgement   ADL Overall ADL's : Needs assistance/impaired Eating/Feeding: Modified independent;Sitting   Grooming: Min guard;Standing   Upper Body Bathing: Set up;Min guard;Sitting   Lower Body Bathing: Min guard;Minimal assistance;Sit to/from stand   Upper Body Dressing : Minimal assistance;Sitting;Standing Upper Body Dressing Details (indicate cue type and reason): donning button up shirt, assist due to R shoulder pain and increased dificulty with buttons Lower Body Dressing: Minimal assistance;Sit to/from stand Lower Body Dressing Details (indicate cue type and reason): assist for doffing/donning socks; pt able to don pants given increased time/effort; slip on tennis shoes with minguard assist for balance (as pt donning in standing) Toilet Transfer: Min guard;Ambulation   Toileting- Clothing Manipulation and Hygiene: Min guard;Sit to/from stand       Functional mobility during ADLs: Min guard General ADL Comments: pt requiring min cues for carry over of precautions during functional task     Vision         Perception     Praxis      Pertinent Vitals/Pain Pain Assessment: Faces Faces Pain Scale: Hurts even more Pain Location: R shoulder, incisional Pain Descriptors / Indicators: Discomfort;Grimacing;Sore Pain Intervention(s): Monitored during session;Repositioned;Limited activity within patient's tolerance     Hand Dominance Right   Extremity/Trunk Assessment Upper Extremity Assessment Upper Extremity Assessment: RUE deficits/detail;LUE deficits/detail RUE Deficits / Details: pt with increased R shoulder  pain s/p surgery, most notable with movements towards abduction, decreased fine motor pt reports due to gout RUE Coordination: decreased fine motor;decreased gross motor LUE Deficits / Details: decreased fine motor pt reports due to gout LUE Coordination: decreased fine  motor   Lower Extremity Assessment Lower Extremity Assessment: Defer to PT evaluation   Cervical / Trunk Assessment Cervical / Trunk Assessment: Other exceptions Cervical / Trunk Exceptions: s/p cervical sx   Communication Communication Communication: No difficulties   Cognition Arousal/Alertness: Awake/alert Behavior During Therapy: WFL for tasks assessed/performed Overall Cognitive Status: Within Functional Limits for tasks assessed                                     General Comments       Exercises     Shoulder Instructions      Home Living Family/patient expects to be discharged to:: Private residence Living Arrangements: Spouse/significant other Available Help at Discharge: Family Type of Home: House             Bathroom Shower/Tub: Walk-in [employer] Toilet: Standard     Home Equipment: None          Prior Functioning/Environment Level of Independence: Independent                 OT Problem List: Decreased strength;Decreased range of motion;Impaired balance (sitting and/or standing);Decreased knowledge of use of DME or AE;Decreased knowledge of precautions;Pain;Impaired UE functional use;Decreased coordination      OT Treatment/Interventions:      OT Goals(Current goals can be found in the care plan section) Acute Rehab OT Goals Patient Stated Goal: home today, less shoulder pain OT Goal Formulation: All assessment and education complete, DC therapy  OT Frequency:     Barriers to D/C:            Co-evaluation              AM-PAC OT "6 Clicks" Daily Activity     Outcome Measure Help from another person eating meals?: None Help from another person taking care of personal grooming?: None Help from another person toileting, which includes using toliet, bedpan, or urinal?: A Little Help from another person bathing (including washing, rinsing, drying)?: A Little Help from another person to put on and taking off  regular upper body clothing?: A Little Help from another person to put on and taking off regular lower body clothing?: A Little 6 Click Score: 20   End of Session Equipment Utilized During Treatment: Cervical collar Nurse Communication: Mobility status  Activity Tolerance: Patient tolerated treatment well Patient left: in chair;with call bell/phone within reach  OT Visit Diagnosis: Other abnormalities of gait and mobility (R26.89);Pain Pain - Right/Left: Right Pain - part of body: Shoulder(and neck)                Time: [id number] OT Time Calculation (min): 19 min Charges:  OT General Charges $OT Visit: 1 Visit OT Evaluation $OT Eval Low Complexity: [place], OT Supplemental Rehabilitation Services Pager [phone] Office [place] 10/21/2019, 10:43 AM

## 2019-10-21 NOTE — Progress Notes (Signed)
Neurosurgery Service Progress Note  Subjective: No acute events overnight, some odynophagia, no dysphagia, no new weakness/numbness   Objective: Vitals:   10/20/19 1804 10/20/19 1936 10/20/19 2322 10/21/19 0344  BP: (!) 152/74 (!) 167/69 (!) 176/83 (!) 148/84  Pulse: 74 90 83 83  Resp: 17 20 20 20   Temp: 98.1 F (36.7 C) 98.2 F (36.8 C) 98.3 F (36.8 C) 98.2 F (36.8 C)  TempSrc: Oral Oral Oral Oral  SpO2: 96% 100% 98% 98%  Weight:      Height:       Temp (24hrs), Avg:98.2 F (36.8 C), Min:97.8 F (36.6 C), Max:98.3 F (36.8 C)  CBC Latest Ref Rng & Units 10/18/2019 06/27/2018 11/17/2016  WBC 4.0 - 10.5 K/uL 7.4 14.1(H) -  Hemoglobin 13.0 - 17.0 g/dL 13.4 14.6 16.3  Hematocrit 39.0 - 52.0 % 41.2 42.9 48.0  Platelets 150 - 400 K/uL 220 234 -   BMP Latest Ref Rng & Units 10/18/2019 06/27/2018 11/17/2016  Glucose 70 - 99 mg/dL 168(H) 192(H) 283(H)  BUN 8 - 23 mg/dL 16 16 17   Creatinine 0.61 - 1.24 mg/dL 0.82 0.83 0.70  Sodium 135 - 145 mmol/L 136 135 135  Potassium 3.5 - 5.1 mmol/L 4.1 3.9 3.9  Chloride 98 - 111 mmol/L 101 101 99(L)  CO2 22 - 32 mmol/L 24 23 -  Calcium 8.9 - 10.3 mg/dL 9.0 8.9 -    Intake/Output Summary (Last 24 hours) at 10/21/2019 0649 Last data filed at 10/21/2019 0356 Gross per 24 hour  Intake 1900 ml  Output 460 ml  Net 1440 ml    Current Facility-Administered Medications:  .  0.9 %  sodium chloride infusion, 250 mL, Intravenous, Continuous, Ostergard, Thomas A, MD .  acetaminophen (TYLENOL) tablet 650 mg, 650 mg, Oral, Q4H PRN, 650 mg at 10/21/19 0356 **OR** acetaminophen (TYLENOL) suppository 650 mg, 650 mg, Rectal, Q4H PRN, Judith Part, MD .  cyclobenzaprine (FLEXERIL) tablet 10 mg, 10 mg, Oral, TID PRN, Judith Part, MD, 10 mg at 10/20/19 1927 .  docusate sodium (COLACE) capsule 100 mg, 100 mg, Oral, BID, Judith Part, MD, 100 mg at 10/20/19 1927 .  glipiZIDE (GLUCOTROL) tablet 5 mg, 5 mg, Oral, BID AC, Ostergard, Thomas A,  MD .  HYDROmorphone (DILAUDID) injection 1 mg, 1 mg, Intravenous, Q3H PRN, Judith Part, MD .  lisinopril (ZESTRIL) tablet 5 mg, 5 mg, Oral, Daily, Ostergard, Joyice Faster, MD .  meclizine (ANTIVERT) tablet 25 mg, 25 mg, Oral, TID PRN, Judith Part, MD .  menthol-cetylpyridinium (CEPACOL) lozenge 3 mg, 1 lozenge, Oral, PRN **OR** phenol (CHLORASEPTIC) mouth spray 1 spray, 1 spray, Mouth/Throat, PRN, Ostergard, Thomas A, MD .  metFORMIN (GLUCOPHAGE) tablet 1,000 mg, 1,000 mg, Oral, Q breakfast, Ostergard, Thomas A, MD .  metoprolol succinate (TOPROL-XL) 24 hr tablet 75 mg, 75 mg, Oral, Daily, Ostergard, Thomas A, MD .  ondansetron (ZOFRAN) tablet 4 mg, 4 mg, Oral, Q6H PRN **OR** ondansetron (ZOFRAN) injection 4 mg, 4 mg, Intravenous, Q6H PRN, Ostergard, Thomas A, MD .  oxyCODONE (Oxy IR/ROXICODONE) immediate release tablet 10 mg, 10 mg, Oral, Q4H PRN, Judith Part, MD, 10 mg at 10/20/19 1927 .  oxyCODONE (Oxy IR/ROXICODONE) immediate release tablet 5 mg, 5 mg, Oral, Q4H PRN, Ostergard, Thomas A, MD .  pantoprazole (PROTONIX) EC tablet 40 mg, 40 mg, Oral, Daily, Ostergard, Thomas A, MD .  polyethylene glycol (MIRALAX / GLYCOLAX) packet 17 g, 17 g, Oral, Daily PRN, Judith Part, MD .  sodium chloride flush (NS) 0.9 % injection 3 mL, 3 mL, Intravenous, Q12H, Ostergard, Thomas A, MD .  sodium chloride flush (NS) 0.9 % injection 3 mL, 3 mL, Intravenous, PRN, Ostergard, Thomas A, MD .  tamsulosin (FLOMAX) capsule 0.4 mg, 0.4 mg, Oral, QHS, Ostergard, Joyice Faster, MD, 0.4 mg at 10/20/19 1927   Physical Exam: AOx3, PERRL, EOMI, FS, Strength 5/5 x4, SILTx4 Neck soft, incision c/d/i  Assessment & Plan: 73 y.o. man s/p 3 level ACDF, recovering well.  -discharge home today  Judith Part  10/21/19 6:49 AM

## 2019-10-21 NOTE — Progress Notes (Signed)
Orthopedic Tech Progress Note Patient Details:  Doctor Cobas Apr 22, 1946 [id number]  Ortho Devices Type of Ortho Device: Soft collar Ortho Device/Splint Location: Neck Ortho Device/Splint Interventions: Ordered, Application, Adjustment   Post Interventions Patient Tolerated: Well Instructions Provided: Adjustment of device, Care of device   [place] 10/21/2019, 8:58 AM

## 2019-10-21 NOTE — Discharge Summary (Signed)
Discharge Summary  Date of Admission: 10/20/2019  Date of Discharge: 10/21/19  Attending Physician: Emelda Brothers, MD  Hospital Course: Patient was admitted following an uncomplicated 3 level ACDF. He was recovered in PACU and transferred to [facility]. His hospital course was uncomplicated and the patient was discharged home on 10/21/2019. He will follow up in clinic with me in 2 weeks.  Neurologic exam at discharge:  AOx3, PERRL, EOMI, FS, TM Strength 5/5 x4, SILTx4  Discharge diagnosis: Cervical myeloradiculopathy  Judith Part, MD 10/21/19 6:51 AM

## 2019-10-21 NOTE — Plan of Care (Signed)
Pt and daughter given D/C instructions with verbal understanding. Rx's were sent to pharmacy by MD. Pt's incision is clean and dry with no sign of infection. Pt's IV was removed prior to D/C. Pt D/C'd home via wheelchair per MD order. Pt is stable @ D/C and has no other needs at this time. Holli Humbles, RN

## 2019-12-28 ENCOUNTER — Ambulatory Visit: Payer: Medicare HMO | Admitting: Cardiovascular Disease

## 2019-12-28 ENCOUNTER — Other Ambulatory Visit: Payer: Self-pay

## 2019-12-28 ENCOUNTER — Encounter: Payer: Self-pay | Admitting: Cardiovascular Disease

## 2019-12-28 VITALS — BP 165/83 | HR 86 | Temp 98.2°F | Ht 69.0 in | Wt 218.0 lb

## 2019-12-28 DIAGNOSIS — Z955 Presence of coronary angioplasty implant and graft: Secondary | ICD-10-CM | POA: Diagnosis not present

## 2019-12-28 DIAGNOSIS — E785 Hyperlipidemia, unspecified: Secondary | ICD-10-CM

## 2019-12-28 DIAGNOSIS — I10 Essential (primary) hypertension: Secondary | ICD-10-CM

## 2019-12-28 DIAGNOSIS — I25118 Atherosclerotic heart disease of native coronary artery with other forms of angina pectoris: Secondary | ICD-10-CM

## 2019-12-28 MED ORDER — LISINOPRIL 10 MG PO TABS: 10.0000 mg | ORAL_TABLET | Freq: Every day | ORAL | 3 refills | Status: DC

## 2019-12-28 NOTE — Progress Notes (Signed)
SUBJECTIVE: The patient presents for follow-up of coronary artery disease with prior percutaneous coronary intervention(angioplasty, no stent)in the early 1990's, hypertension, and dyslipidemia. He has been intolerant to multiple cholesterol medications.  He underwent a low risk nuclear stress test on 11/18/2018.  There was soft tissue attenuation with potentially a mild degree of ischemia.  LVEF 54%.  The patient denies any symptoms of chest pain, palpitations, shortness of breath, lightheadedness, dizziness, leg swelling, orthopnea, PND, and syncope.  I personally reviewed the ECG performed today which demonstrates sinus rhythm with right bundle branch block and PVCs.  He underwent neck surgery in 2020 and no longer has pain.   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  He has traveled to 66 states and all [place] providences [place] the [place].  His father was a Theme park manager in [place].  Review of Systems: As per "subjective", otherwise negative.  Allergies  Allergen Reactions  . Simvastatin Other (See Comments)    dizziness    Current Outpatient Medications  Medication Sig Dispense Refill  . aspirin EC 81 MG tablet Take 1 tablet (81 mg total) by mouth daily. Restart on 10/28/2019 30 tablet   . b complex vitamins tablet Take 1 tablet by mouth daily.    . colchicine 0.6 MG tablet Take 0.6 mg by mouth daily as needed (gout flare).    . cyclobenzaprine (FLEXERIL) 10 MG tablet Take 1 tablet (10 mg total) by mouth 3 (three) times daily as needed for muscle spasms. 30 tablet 0  . diclofenac Sodium (VOLTAREN) 1 % GEL Apply topically.    Marland Kitchen glipiZIDE (GLUCOTROL) 5 MG tablet Take 5 mg by mouth 2 (two) times daily before a meal.     . glucose blood test strip Use as directed. Pharmacy, please dispense this brand of blood glucose test strips: OneTouch Verio flex. Pt checks blood sugars 3 times a day    . LIFESCAN FINEPOINT LANCETS MISC Use to check blood  sugar 3 time(s) daily    . lisinopril (PRINIVIL,ZESTRIL) 5 MG tablet Take 5 mg by mouth daily.    . meclizine (ANTIVERT) 25 MG tablet Take 25 mg by mouth 3 (three) times daily as needed for dizziness.    . meloxicam (MOBIC) 15 MG tablet Take by mouth.    . metFORMIN (GLUCOPHAGE) 1000 MG tablet Take 1,000 mg by mouth daily with breakfast.    . metoprolol succinate (TOPROL-XL) 50 MG 24 hr tablet TAKE 1 & 1/2 (ONE & ONE-HALF) TABLETS BY MOUTH ONCE DAILY WITH OR IMMEDIATELY FOLLOWING A MEAL (Patient taking differently: Take 75 mg by mouth daily. ) 135 tablet 3  . Omega-3 Fatty Acids (FISH OIL) 1000 MG CAPS Take 1,000 mg by mouth 2 (two) times daily.     Marland Kitchen omeprazole (PRILOSEC) 20 MG capsule Take 20 mg by mouth daily.   2  . ondansetron (ZOFRAN) 4 MG tablet Take 4 mg by mouth every 8 (eight) hours as needed for nausea or vomiting.    . tamsulosin (FLOMAX) 0.4 MG CAPS capsule Take 0.4 mg by mouth at bedtime.     . vitamin E 400 UNIT capsule Take 400 Units by mouth daily.     No current facility-administered medications for this visit.    Past Medical History:  Diagnosis Date  . CAD (coronary artery disease)   . Cancer ([place])   . Diabetes ([place])   . Dyslipidemia   . Gout   . History of  kidney stones   . HTN (hypertension)   . Myocardial infarction [place])     Past Surgical History:  Procedure Laterality Date  . ANTERIOR CERVICAL DECOMP/DISCECTOMY FUSION Left 10/20/2019   Procedure: Left Cervical Three-Four Cervical Four-Five Cervical Five-Six Anterior cervical decompression/discectomy/fusion;  Surgeon: Judith Part, MD;  Location: [facility];  Service: Neurosurgery;  Laterality: Left;  Left Cervical Three-Four Cervical Four-Five Cervical Five-Six Anterior cervical decompression/discectomy/fusion  . BACK SURGERY    . left knee surgery    . left shoulder surgery    . right knee surgery      Social History   Socioeconomic History  . Marital status: Married    Spouse name: Not on file  .  Number of children: Not on file  . Years of education: Not on file  . Highest education level: Not on file  Occupational History  . Not on file  Tobacco Use  . Smoking status: Former Smoker    Packs/day: 5.00    Years: 36.00    Pack years: 180.00    Types: Cigarettes    Start date: 11/24/1952    Quit date: 11/24/1988    Years since quitting: 31.1  . Smokeless tobacco: Current User    Types: Snuff  Substance and Sexual Activity  . Alcohol use: No    Alcohol/week: 0.0 standard drinks  . Drug use: No  . Sexual activity: Not on file  Other Topics Concern  . Not on file  Social History Narrative  . Not on file   Social Determinants of Health   Financial Resource Strain:   . Difficulty of Paying Living Expenses: Not on file  Food Insecurity:   . Worried About Charity fundraiser in the Last Year: Not on file  . Ran Out of Food in the Last Year: Not on file  Transportation Needs:   . Lack of Transportation (Medical): Not on file  . Lack of Transportation (Non-Medical): Not on file  Physical Activity:   . Days of Exercise per Week: Not on file  . Minutes of Exercise per Session: Not on file  Stress:   . Feeling of Stress : Not on file  Social Connections:   . Frequency of Communication with Friends and Family: Not on file  . Frequency of Social Gatherings with Friends and Family: Not on file  . Attends Religious Services: Not on file  . Active Member of Clubs or Organizations: Not on file  . Attends Archivist Meetings: Not on file  . Marital Status: Not on file  Intimate Partner Violence:   . Fear of Current or Ex-Partner: Not on file  . Emotionally Abused: Not on file  . Physically Abused: Not on file  . Sexually Abused: Not on file     Vitals:   12/28/19 1047  BP: (!) 165/83  Pulse: 86  Temp: 98.2 F (36.8 C)  SpO2: 99%  Weight: 218 lb (98.9 kg)  Height: 5\' 9"  (1.753 m)    Wt Readings from Last 3 Encounters:  12/28/19 218 lb (98.9 kg)  10/20/19  221 lb 2 oz (100.3 kg)  10/18/19 221 lb 2 oz (100.3 kg)     PHYSICAL EXAM General: NAD HEENT: Normal. Neck: No JVD, no thyromegaly. Lungs: Clear to auscultation bilaterally with normal respiratory effort. CV: Regular rate and rhythm, normal S1/S2, no S3/S4, no murmur. No pretibial or periankle edema.  No carotid bruit.   Abdomen: Soft, nontender, no distention.  Neurologic: Alert and oriented.  Psych: Normal affect. Skin: Normal. Musculoskeletal: No gross deformities.      Labs: Lab Results  Component Value Date/Time   K 4.1 10/18/2019 08:30 AM   BUN 16 10/18/2019 08:30 AM   CREATININE 0.82 10/18/2019 08:30 AM   ALT 20 06/27/2018 08:10 PM   HGB 13.4 10/18/2019 08:30 AM     Lipids: No results found for: LDLCALC, LDLDIRECT, CHOL, TRIG, HDL     ASSESSMENT AND PLAN:  1. CAD with prior PCI: Symptomatically stable. Continue ASA and metoprolol. He has been intolerant of statin therapy.  Low risk nuclear stress test on 11/18/2018.   2. Essential [id number] pressure is elevated today.  I will increase lisinopril to 10 mg daily.  Renal function normal on 12/01/2019.  3. Dyslipidemia: Hepreviouslystopped Crestor due to side effects. He has been intolerant of multiple statins. Lipid panel on 06/15/2019 showed total cholesterol 163, triglycerides elevated at 275, HDL 29, LDL 79.  We previously discussed PCSK9 inhibitors.   Disposition: Follow up 1 year   Kate Sable, M.D., F.A.C.C.

## 2019-12-28 NOTE — Patient Instructions (Signed)
Medication Instructions:  INCREASE Lisinopril to 10 mg daily  *If you need a refill on your cardiac medications before your next appointment, please call your pharmacy*  Lab Work: None today If you have labs (blood work) drawn today and your tests are completely normal, you will receive your results only by: Marland Kitchen MyChart Message (if you have MyChart) OR . A paper copy in the mail If you have any lab test that is abnormal or we need to change your treatment, we will call you to review the results.  Testing/Procedures: None today  Follow-Up: At [facility], you and your health needs are our priority.  As part of our continuing mission to provide you with exceptional heart care, we have created designated Provider Care Teams.  These Care Teams include your primary Cardiologist (physician) and Advanced Practice Providers (APPs -  Physician Assistants and Nurse Practitioners) who all work together to provide you with the care you need, when you need it.  Your next appointment:   12 month(s)  The format for your next appointment:   In Person  Provider:   Kate Sable, MD  Other Instructions None     Thank you for choosing [facility] !

## 2020-02-10 ENCOUNTER — Telehealth: Payer: Self-pay | Admitting: *Deleted

## 2020-02-10 NOTE — Telephone Encounter (Signed)
We do recommend you get the Covid vaccine. However, if you have additional questions please call your Primary Care doctor to discuss if the Vaccine is right for you. Verbalized understanding.

## 2020-07-04 ENCOUNTER — Telehealth: Payer: Self-pay | Admitting: Cardiology

## 2020-07-04 NOTE — Telephone Encounter (Signed)
Son states the patient is now at [facility] and would like one of the doctors to call him or the hosp. Former Pt of [facility]     Please call  [phone]    Thanks Renee Pt has upcoming appt with [facility]

## 2020-07-05 NOTE — Telephone Encounter (Signed)
Son is requesting that his father be transferred from [facility] to a [facility] facility. Son reports that his father has had a stroke and the son is unhappy with the care that he is receiving at [facility]. Son is also calling other health care providers for transfer. Please advise.

## 2020-07-06 NOTE — Telephone Encounter (Signed)
Spoke with son who states that pt is at home and fu appt would be made my the daughter.

## 2020-07-06 NOTE — Telephone Encounter (Signed)
Would have to discuss with the physicians at [facility] to make transfer, they would need to contact the hospital physicians at [facility]. Our office would not have any access to facilitating a transfer   Zandra Abts MD

## 2020-07-15 ENCOUNTER — Encounter (HOSPITAL_COMMUNITY): Payer: Self-pay | Admitting: Radiology

## 2020-07-15 ENCOUNTER — Emergency Department (HOSPITAL_COMMUNITY): Payer: Medicare HMO

## 2020-07-15 ENCOUNTER — Emergency Department (HOSPITAL_COMMUNITY)
Admission: EM | Admit: 2020-07-15 | Discharge: 2020-07-15 | Disposition: A | Discharge status: Home / Self Care | Payer: Medicare HMO | Attending: Emergency Medicine | Admitting: Emergency Medicine

## 2020-07-15 DIAGNOSIS — Z7982 Long term (current) use of aspirin: Secondary | ICD-10-CM | POA: Insufficient documentation

## 2020-07-15 DIAGNOSIS — I10 Essential (primary) hypertension: Secondary | ICD-10-CM | POA: Insufficient documentation

## 2020-07-15 DIAGNOSIS — Z79899 Other long term (current) drug therapy: Secondary | ICD-10-CM | POA: Diagnosis not present

## 2020-07-15 DIAGNOSIS — R531 Weakness: Secondary | ICD-10-CM | POA: Insufficient documentation

## 2020-07-15 DIAGNOSIS — R791 Abnormal coagulation profile: Secondary | ICD-10-CM | POA: Diagnosis not present

## 2020-07-15 DIAGNOSIS — R42 Dizziness and giddiness: Secondary | ICD-10-CM

## 2020-07-15 DIAGNOSIS — Z859 Personal history of malignant neoplasm, unspecified: Secondary | ICD-10-CM | POA: Diagnosis not present

## 2020-07-15 DIAGNOSIS — E119 Type 2 diabetes mellitus without complications: Secondary | ICD-10-CM | POA: Insufficient documentation

## 2020-07-15 DIAGNOSIS — Z87891 Personal history of nicotine dependence: Secondary | ICD-10-CM | POA: Insufficient documentation

## 2020-07-15 DIAGNOSIS — I2510 Atherosclerotic heart disease of native coronary artery without angina pectoris: Secondary | ICD-10-CM | POA: Diagnosis not present

## 2020-07-15 DIAGNOSIS — Z7984 Long term (current) use of oral hypoglycemic drugs: Secondary | ICD-10-CM | POA: Diagnosis not present

## 2020-07-15 LAB — I-STAT CHEM 8, ED
BUN: 30 mg/dL — ABNORMAL HIGH (ref 8–23)
Calcium, Ion: 1.03 mmol/L — ABNORMAL LOW (ref 1.15–1.40)
Chloride: 101 mmol/L (ref 98–111)
Creatinine, Ser: 0.9 mg/dL (ref 0.61–1.24)
Glucose, Bld: 228 mg/dL — ABNORMAL HIGH (ref 70–99)
HCT: 42 % (ref 39.0–52.0)
Hemoglobin: 14.3 g/dL (ref 13.0–17.0)
Potassium: 4.8 mmol/L (ref 3.5–5.1)
Sodium: 134 mmol/L — ABNORMAL LOW (ref 135–145)
TCO2: 20 mmol/L — ABNORMAL LOW (ref 22–32)

## 2020-07-15 LAB — COMPREHENSIVE METABOLIC PANEL
ALT: 17 U/L (ref 0–44)
AST: 20 U/L (ref 15–41)
Albumin: 3.7 g/dL (ref 3.5–5.0)
Alkaline Phosphatase: 37 U/L — ABNORMAL LOW (ref 38–126)
Anion gap: 12 (ref 5–15)
BUN: 29 mg/dL — ABNORMAL HIGH (ref 8–23)
CO2: 19 mmol/L — ABNORMAL LOW (ref 22–32)
Calcium: 8.6 mg/dL — ABNORMAL LOW (ref 8.9–10.3)
Chloride: 101 mmol/L (ref 98–111)
Creatinine, Ser: 1.09 mg/dL (ref 0.61–1.24)
GFR calc Af Amer: >60 mL/min (ref >60)
GFR calc non Af Amer: >60 mL/min (ref >60)
Glucose, Bld: 230 mg/dL — ABNORMAL HIGH (ref 70–99)
Potassium: 4.8 mmol/L (ref 3.5–5.1)
Sodium: 132 mmol/L — ABNORMAL LOW (ref 135–145)
Total Bilirubin: 0.7 mg/dL (ref 0.3–1.2)
Total Protein: 6.6 g/dL (ref 6.5–8.1)

## 2020-07-15 LAB — PROTIME-INR
INR: 1 (ref 0.8–1.2)
Prothrombin Time: 13.2 seconds (ref 11.4–15.2)

## 2020-07-15 LAB — DIFFERENTIAL
Abs Immature Granulocytes: 0.2 10*3/uL — ABNORMAL HIGH (ref 0.00–0.07)
Basophils Absolute: 0.1 10*3/uL (ref 0.0–0.1)
Basophils Relative: 0 %
Eosinophils Absolute: 0.1 10*3/uL (ref 0.0–0.5)
Eosinophils Relative: 1 %
Immature Granulocytes: 2 %
Lymphocytes Relative: 26 %
Lymphs Abs: 3.4 10*3/uL (ref 0.7–4.0)
Monocytes Absolute: 0.6 10*3/uL (ref 0.1–1.0)
Monocytes Relative: 4 %
Neutro Abs: 9 10*3/uL — ABNORMAL HIGH (ref 1.7–7.7)
Neutrophils Relative %: 67 %

## 2020-07-15 LAB — CBG MONITORING, ED: Glucose-Capillary: 226 mg/dL — ABNORMAL HIGH (ref 70–99)

## 2020-07-15 LAB — CBC
HCT: 43.2 % (ref 39.0–52.0)
Hemoglobin: 13.7 g/dL (ref 13.0–17.0)
MCH: 27.7 pg (ref 26.0–34.0)
MCHC: 31.7 g/dL (ref 30.0–36.0)
MCV: 87.4 fL (ref 80.0–100.0)
Platelets: 255 10*3/uL (ref 150–400)
RBC: 4.94 MIL/uL (ref 4.22–5.81)
RDW: 13.4 % (ref 11.5–15.5)
WBC: 13.4 10*3/uL — ABNORMAL HIGH (ref 4.0–10.5)
nRBC: 0 % (ref 0.0–0.2)

## 2020-07-15 LAB — APTT: aPTT: 30 seconds (ref 24–36)

## 2020-07-15 MED ORDER — SODIUM CHLORIDE 0.9% FLUSH
3.0000 mL | Freq: Once | INTRAVENOUS | Status: AC
  Administered 2020-07-15: 3 mL via INTRAVENOUS

## 2020-07-15 MED ORDER — SODIUM CHLORIDE 0.9 % IV BOLUS
500.0000 mL | Freq: Once | INTRAVENOUS | Status: AC
  Administered 2020-07-15: 500 mL via INTRAVENOUS

## 2020-07-15 NOTE — ED Notes (Signed)
Ambulated Pt with help of family member. Pt has unsteady gait and reports being lightheaded and dizzy during ambulation.

## 2020-07-15 NOTE — Code Documentation (Signed)
Patient arrived via EMS to ED.  He refused to go to [facility] because he said "he didn't want to get butchered."  Patient was just worked up for TIA last week with a CT and labs.  Initially they said that his LKW was 9/4 1100, but when asked further he said that he has had these symptoms since 07/11/20.  He presents with left sided weakness, facial droop left side, slurred speech, and headache.  Patient comes from home.  He has a history of DM, HTN, HLD, and previous TIA.  He states that he is compliant with his medication.  Patient is not going to be treated but will get an MRI later.  Orders are for neuro checks q2h x 12 then q4.

## 2020-07-15 NOTE — Consult Note (Signed)
NEUROLOGY CONSULTATION NOTE   Date of service: July 15, 2020 Patient Name: [name] [name]. MRN:  [id number] DOB:  May 27, 1946 Reason for consult: "Stroke code"  History of Present Illness  [name] [name]. is a 74 y.o. male with PMH significant for DM2, HTN, HLD, MI who presents with L sided weakness that he initially reported to the EMS as starting on 1100 on 07/15/20. On questioning, he reports that this has been going on since 07/11/20. He endorses that this worsened today so he called EMS.  On chart review, he was seen at [facility] on 07/04/20. He presented there with L sided weakness. Workup with CTH and CTA Head and Neck with no LVO. MRI Brain on 07/05/20 was negative for an acute stroke. At [facility], he had a recurrence of the left upper extremity symptoms however repeat CT scan was negative. He was advised to follow up with his neurosurgeon for concern for potential progressive cervical spine disease.  Workup with CTH without contrast with no acute abnormality. He was outside the tPA window. STAT MRI Brain with no diffusion restriction suggestive of a stroke.    ROS   Constitutional Denies weight loss, fever and chills.  HEENT Denies changes in vision and hearing.  Respiratory Denies SOB and cough.  CV Denies palpitations and CP  GI Denies abdominal pain, nausea, vomiting and diarrhea.  GU Denies dysuria and urinary frequency.  MSK Denies myalgia and joint pain.  Skin Denies rash and pruritus.  Neurological Denies headache and syncope.  Psychiatric Denies recent changes in mood. Denies anxiety and depression.   Past History   Past Medical History:  Diagnosis Date  . CAD (coronary artery disease)   . Cancer ([place])   . Diabetes ([place])   . Dyslipidemia   . Gout   . History of kidney stones   . HTN (hypertension)   . Myocardial infarction [facility])    Past Surgical History:  Procedure Laterality Date  . ANTERIOR CERVICAL DECOMP/DISCECTOMY FUSION Left 10/20/2019   Procedure: Left  Cervical Three-Four Cervical Four-Five Cervical Five-Six Anterior cervical decompression/discectomy/fusion;  Surgeon: Judith Part, MD;  Location: [place];  Service: Neurosurgery;  Laterality: Left;  Left Cervical Three-Four Cervical Four-Five Cervical Five-Six Anterior cervical decompression/discectomy/fusion  . BACK SURGERY    . left knee surgery    . left shoulder surgery    . right knee surgery     Family History  Problem Relation Age of Onset  . Heart attack Father 36  . Colon cancer Brother 83   Social History   Socioeconomic History  . Marital status: Married    Spouse name: Not on file  . Number of children: Not on file  . Years of education: Not on file  . Highest education level: Not on file  Occupational History  . Not on file  Tobacco Use  . Smoking status: Former Smoker    Packs/day: 5.00    Years: 36.00    Pack years: 180.00    Types: Cigarettes    Start date: 11/24/1952    Quit date: 11/24/1988    Years since quitting: 31.6  . Smokeless tobacco: Current User    Types: Snuff  Vaping Use  . Vaping Use: Never used  Substance and Sexual Activity  . Alcohol use: No    Alcohol/week: 0.0 standard drinks  . Drug use: No  . Sexual activity: Not on file  Other Topics Concern  . Not on file  Social History Narrative  .  Not on file   Social Determinants of Health   Financial Resource Strain:   . Difficulty of Paying Living Expenses: Not on file  Food Insecurity:   . Worried About Charity fundraiser in the Last Year: Not on file  . Ran Out of Food in the Last Year: Not on file  Transportation Needs:   . Lack of Transportation (Medical): Not on file  . Lack of Transportation (Non-Medical): Not on file  Physical Activity:   . Days of Exercise per Week: Not on file  . Minutes of Exercise per Session: Not on file  Stress:   . Feeling of Stress : Not on file  Social Connections:   . Frequency of Communication with Friends and Family: Not on file  .  Frequency of Social Gatherings with Friends and Family: Not on file  . Attends Religious Services: Not on file  . Active Member of Clubs or Organizations: Not on file  . Attends Archivist Meetings: Not on file  . Marital Status: Not on file   Allergies  Allergen Reactions  . Simvastatin Other (See Comments)    dizziness    Medications  (Not in a hospital admission)    Vitals  Temp:  [98.4 F (36.9 C)] 98.4 F (36.9 C) (09/04 1533) Pulse Rate:  [74] 74 (09/04 1533) Resp:  [16] 16 (09/04 1533) BP: (113)/(70) 113/70 (09/04 1533) SpO2:  [98 %] 98 % (09/04 1533) Weight:  [102.1 kg] 102.1 kg (09/04 1534)  Body mass index is 30.52 kg/m.  Physical Exam   General: Laying comfortably in bed; in no acute distress.  HENT: Normal oropharynx and mucosa. Normal external appearance of ears and nose. Neck: Supple, no pain or tenderness CV: No JVD. No peripheral edema. Pulmonary: Symmetric Chest rise. Normal respiratory effort. Abdomen: Soft to touch, non-tender Ext: No cyanosis, edema, or deformity  Skin: No rash. Normal palpation of skin.   Musculoskeletal: Normal digits and nails by inspection. No clubbing.  Neurologic Examination  Mental status/Cognition: Alert, oriented to self, place, month and year, good attention. Speech/language: Fluent, comprehension intact, object naming intact, repetition intact. Cranial nerves:   CN II Pupils equal and reactive to light, no VF deficits   CN III,IV,VI EOM intact, no gaze preference or deviation, no nystagmus   CN V Decreased with midline splitting in L upper and lower face.   CN VII no asymmetry, no nasolabial fold flattening   CN VIII normal hearing to speech   CN IX & X normal palatal elevation, no uvular deviation   CN XI 5/5 head turn and 5/5 shoulder shrug bilaterally   CN XII midline tongue protrusion   Motor:  Muscle bulk: normal, tone normal.  LUE: 2/5 RUE: 5/5 LLE: 2/5 RLE: 5/5 Positive hoover's sign on the  left.  Reflexes:  Right Left Comments  Pectoralis      Biceps (C5/6) 1 1   Brachioradialis (C5/6) 1 1    Triceps (C6/7) 1 1    Patellar (L3/4) 1 1    Achilles (S1)      Hoffman      Plantar     Jaw jerk    Sensation:  Light touch Decreased in L forehead, face, arm and leg.   Pin prick Decreased in L forehead, face, arm and leg.   Temperature    Vibration   Proprioception     Labs   Lab Results  Component Value Date   NA 134 (L) 07/15/2020  K 4.8 07/15/2020   CL 101 07/15/2020   CO2 19 (L) 07/15/2020   GLUCOSE 228 (H) 07/15/2020   BUN 30 (H) 07/15/2020   CREATININE 0.90 07/15/2020   CALCIUM 8.6 (L) 07/15/2020   ALBUMIN 3.7 07/15/2020   AST 20 07/15/2020   ALT 17 07/15/2020   ALKPHOS 37 (L) 07/15/2020   BILITOT 0.7 07/15/2020   GFRNONAA >60 07/15/2020   GFRAA >60 07/15/2020     Imaging and Diagnostic studies  MRI Brain without contrast: No acute stroke.  Impression   [name] [name]. is a 74 y.o. male with PMH significant for DM2, HTN, HLD, MI who presents with L sided weakness that he initially reported to the EMS as starting on 1100 on 07/15/20. On questioning, he reports that this has been going on since 07/11/20. His neurologic examination is notable for L sided weakness and L sided numbness. MRI Brain without contrast with no acute abnormality. Exam was also positive for signs typically seen in functional neurological disorder including midline splitting, labelle indifference, yes/no on sensory testing and hoovers + on testing LLE. 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 He has had recent full stroke workup at [facility] with CTA negative for LVO. Recent Echo with EF of 55-60%. He is allergic to statins. Continue Aspirin 81mg  daily.  Recommendations  - Recommend follow up with outpatient neurologist and neurosurgeon. ______________________________________________________________________   Thank you for the opportunity to take part in the  care of this patient. If you have any further questions, please contact the neurology consultation attending.  Signed,  [place] Pager Number [phone]

## 2020-07-15 NOTE — ED Provider Notes (Signed)
[name] [name]   CSN: [id number] Arrival date & time: 07/15/20  1502  An emergency department physician performed an initial assessment on this suspected stroke patient at 42.  History Chief Complaint  Patient presents with  . Code Stroke    stroke symptoms    [name] [name]. is a 74 y.o. male.  Patient with history of diabetes, high cholesterol, hypertension who presents to the ED with left-sided weakness.  Similar to recent event and had stroke work-up at [facility].  States around 1030/11:00 left-sided weakness with facial droop.  Has arrived as a code stroke.  Denies any chest pain, shortness of breath.  The history is provided by the patient and the EMS personnel.  Neurologic Problem This is a recurrent problem. The current episode started 3 to 5 hours ago. The problem occurs constantly. The problem has not changed since onset.Pertinent negatives include no chest pain, no abdominal pain, no headaches and no shortness of breath. Nothing aggravates the symptoms. Nothing relieves the symptoms. He has tried nothing for the symptoms. The treatment provided no relief.       Past Medical History:  Diagnosis Date  . CAD (coronary artery disease)   . Cancer ([place])   . Diabetes ([place])   . Dyslipidemia   . Gout   . History of kidney stones   . HTN (hypertension)   . Myocardial infarction [facility])     Patient Active Problem List   Diagnosis Date Noted  . Cervical myelopathy ([place]) 10/20/2019  . SOB (shortness of breath) on exertion 07/22/2013  . CAD S/P percutaneous coronary angioplasty 07/09/2013  . HTN (hypertension) 07/09/2013  . Hyperlipidemia 07/09/2013  . Chest pain 07/09/2013    Past Surgical History:  Procedure Laterality Date  . ANTERIOR CERVICAL DECOMP/DISCECTOMY FUSION Left 10/20/2019   Procedure: Left Cervical Three-Four Cervical Four-Five Cervical Five-Six Anterior cervical decompression/discectomy/fusion;  Surgeon:  [name] Part, MD;  Location: [place];  Service: Neurosurgery;  Laterality: Left;  Left Cervical Three-Four Cervical Four-Five Cervical Five-Six Anterior cervical decompression/discectomy/fusion  . BACK SURGERY    . left knee surgery    . left shoulder surgery    . right knee surgery         Family History  Problem Relation Age of Onset  . Heart attack Father 22  . Colon cancer Brother 31    Social History   Tobacco Use  . Smoking status: Former Smoker    Packs/day: 5.00    Years: 36.00    Pack years: 180.00    Types: Cigarettes    Start date: 11/24/1952    Quit date: 11/24/1988    Years since quitting: 31.6  . Smokeless tobacco: Current User    Types: Snuff  Vaping Use  . Vaping Use: Never used  Substance Use Topics  . Alcohol use: No    Alcohol/week: 0.0 standard drinks  . Drug use: No    Home Medications Prior to Admission medications   Medication Sig Start Date End Date Taking? Authorizing Provider  aspirin EC 81 MG tablet Take 1 tablet (81 mg total) by mouth daily. Restart on 10/28/2019 10/21/19   [name] Part, MD  b complex vitamins tablet Take 1 tablet by mouth daily.    [provider]  colchicine 0.6 MG tablet Take 0.6 mg by mouth daily as needed (gout flare).    [provider]  cyclobenzaprine (FLEXERIL) 10 MG tablet Take 1 tablet (10 mg total) by mouth  3 (three) times daily as needed for muscle spasms. 10/21/19   [name] Part, MD  diclofenac Sodium (VOLTAREN) 1 % GEL Apply topically. 11/16/19   [provider]  glipiZIDE (GLUCOTROL) 5 MG tablet Take 5 mg by mouth 2 (two) times daily before a meal.     [provider]  LIFESCAN FINEPOINT LANCETS MISC Use to check blood sugar 3 time(s) daily 10/06/17   [provider]  lisinopril (ZESTRIL) 10 MG tablet Take 1 tablet (10 mg total) by mouth daily. 12/28/19   [name] Commons, MD  meclizine (ANTIVERT) 25 MG tablet Take 25 mg by mouth 3 (three) times  daily as needed for dizziness.    [provider]  meloxicam (MOBIC) 15 MG tablet Take by mouth. 11/16/19   [provider]  metFORMIN (GLUCOPHAGE) 1000 MG tablet Take 1,000 mg by mouth daily with breakfast.    [provider]  metoprolol succinate (TOPROL-XL) 50 MG 24 hr tablet TAKE 1 & 1/2 (ONE & ONE-HALF) TABLETS BY MOUTH ONCE DAILY WITH OR IMMEDIATELY FOLLOWING A MEAL Patient taking differently: Take 75 mg by mouth daily.  01/15/18   [name] Commons, MD  Omega-3 Fatty Acids (FISH OIL) 1000 MG CAPS Take 1,000 mg by mouth 2 (two) times daily.     [provider]  omeprazole (PRILOSEC) 20 MG capsule Take 20 mg by mouth daily.  10/02/16   [provider]  ondansetron (ZOFRAN) 4 MG tablet Take 4 mg by mouth every 8 (eight) hours as needed for nausea or vomiting.    [provider]  tamsulosin (FLOMAX) 0.4 MG CAPS capsule Take 0.4 mg by mouth at bedtime.  01/17/15   [provider]  vitamin E 400 UNIT capsule Take 400 Units by mouth daily.    [provider]    Allergies    Simvastatin  Review of Systems   Review of Systems  Constitutional: Negative for chills and fever.  HENT: Negative for ear pain and sore throat.   Eyes: Negative for pain and visual disturbance.  Respiratory: Negative for cough and shortness of breath.   Cardiovascular: Negative for chest pain and palpitations.  Gastrointestinal: Negative for abdominal pain and vomiting.  Genitourinary: Negative for dysuria and hematuria.  Musculoskeletal: Negative for arthralgias and back pain.  Skin: Negative for color change and rash.  Neurological: Positive for facial asymmetry and weakness. Negative for seizures, syncope, numbness and headaches.  All other systems reviewed and are negative.   Physical Exam Updated Vital Signs BP 120/85 (BP Location: Right Arm)   Pulse 67   Temp 98.5 F (36.9 C) (Oral)   Resp 16   Ht 6' (1.829 m)   Wt 102.1 kg   SpO2  99%   BMI 30.52 kg/m   Physical Exam Vitals and nursing [name] reviewed.  Constitutional:      General: He is not in acute distress.    Appearance: He is well-developed. He is not ill-appearing.  HENT:     Head: Normocephalic and atraumatic.     Nose: Nose normal.     Mouth/Throat:     Mouth: Mucous membranes are moist.     Pharynx: No oropharyngeal exudate.  Eyes:     Extraocular Movements: Extraocular movements intact.     Conjunctiva/sclera: Conjunctivae normal.     Pupils: Pupils are equal, round, and reactive to light.  Cardiovascular:     Rate and Rhythm: Normal rate and regular rhythm.     Pulses: Normal  pulses.     Heart sounds: Normal heart sounds. No murmur heard.   Pulmonary:     Effort: Pulmonary effort is normal. No respiratory distress.     Breath sounds: Normal breath sounds.  Abdominal:     Palpations: Abdomen is soft.     Tenderness: There is no abdominal tenderness.  Musculoskeletal:     Cervical back: Normal range of motion and neck supple.  Skin:    General: Skin is warm and dry.     Capillary Refill: Capillary refill takes less than 2 seconds.  Neurological:     Mental Status: He is alert.     Sensory: No sensory deficit.     Comments: Appears to have left-sided weakness but may be effort dependent, 5+ out of 5 strength on right side, no obvious facial droop, poor effort with open in both eyes bilaterally, slightly mumbled speech, visual acuity and visual fields intact     ED Results / Procedures / Treatments   Labs (all labs ordered are listed, but only abnormal results are displayed) Labs Reviewed  CBC - Abnormal; Notable for the following components:      Result Value   WBC 13.4 (*)    All other components within normal limits  DIFFERENTIAL - Abnormal; Notable for the following components:   Neutro Abs 9.0 (*)    Abs Immature Granulocytes 0.20 (*)    All other components within normal limits  COMPREHENSIVE METABOLIC PANEL - Abnormal; Notable  for the following components:   Sodium 132 (*)    CO2 19 (*)    Glucose, Bld 230 (*)    BUN 29 (*)    Calcium 8.6 (*)    Alkaline Phosphatase 37 (*)    All other components within normal limits  I-STAT CHEM 8, ED - Abnormal; Notable for the following components:   Sodium 134 (*)    BUN 30 (*)    Glucose, Bld 228 (*)    Calcium, Ion 1.03 (*)    TCO2 20 (*)    All other components within normal limits  CBG MONITORING, ED - Abnormal; Notable for the following components:   Glucose-Capillary 226 (*)    All other components within normal limits  PROTIME-INR  APTT  URINALYSIS, ROUTINE W REFLEX MICROSCOPIC    EKG None  Radiology MR BRAIN WO CONTRAST  Result Date: 07/15/2020 CLINICAL DATA:  Facial droop, left-sided weakness, slurred speech, and headache. EXAM: MRI HEAD WITHOUT CONTRAST TECHNIQUE: Multiplanar, multiecho pulse sequences of the brain and surrounding structures were obtained without intravenous contrast. COMPARISON:  Head CT 07/15/2020 and MRI 07/05/2020 FINDINGS: Brain: There is no evidence of an acute infarct, intracranial hemorrhage, mass, midline shift, or extra-axial fluid collection. Mild cerebral atrophy is within normal limits for age. The brain is normal in signal. Vascular: Major intracranial vascular flow voids are preserved. Skull and upper cervical spine: Unremarkable bone marrow signal. C3-4 ACDF. Sinuses/Orbits: Unremarkable orbits. Paranasal sinuses and mastoid air cells are clear. Other: None. IMPRESSION: Unremarkable appearance of the brain for age. Electronically Signed   By: Logan Bores M.D.   On: 07/15/2020 16:54   CT HEAD CODE STROKE WO CONTRAST  Addendum Date: 07/15/2020   ADDENDUM REPORT: 07/15/2020 15:43 ADDENDUM: These results were communicated to Dr. Lorrin Goodell at 3:22 pm on 07/15/2020 by text page via the [facility] messaging system. Electronically Signed   By: Logan Bores M.D.   On: 07/15/2020 15:43   Result Date: 07/15/2020 CLINICAL DATA:  Code stroke.  Left-sided  weakness, slurred speech, and headache. EXAM: CT HEAD WITHOUT CONTRAST TECHNIQUE: Contiguous axial images were obtained from the base of the skull through the vertex without intravenous contrast. COMPARISON:  Head CT and MRI 11/17/2016 FINDINGS: Brain: There is no evidence of an acute infarct, intracranial hemorrhage, mass, midline shift, or extra-axial fluid collection. The ventricles and sulci are within normal limits for age. Vascular: Calcified atherosclerosis at the skull base. No hyperdense vessel. Skull: No fracture or suspicious osseous lesion. Sinuses/Orbits: Visualized paranasal sinuses and mastoid air cells are clear. Unremarkable orbits. Other: None. ASPECTS [facility] Stroke Program Early CT Score) - Ganglionic level infarction (caudate, lentiform nuclei, internal capsule, insula, M1-M3 cortex): 7 - Supraganglionic infarction (M4-M6 cortex): 3 Total score (0-10 with 10 being normal): 10 IMPRESSION: 1. Unremarkable CT appearance of the brain. 2. ASPECTS is 10. Electronically Signed: By: Logan Bores M.D. On: 07/15/2020 15:22    Procedures Procedures (including critical care time)  Medications Ordered in ED Medications  sodium chloride flush (NS) 0.9 % injection 3 mL (3 mLs Intravenous Given 07/15/20 1750)  sodium chloride 0.9 % bolus 500 mL (500 mLs Intravenous New Bag/Given 07/15/20 1748)    ED Course  I have reviewed the triage vital signs and the nursing notes.  Pertinent labs & imaging results that were available during my care of the patient were reviewed by me and considered in my medical decision making (see chart for details).    MDM Rules/Calculators/A&P                          Edgardo Petrenko. is a 74 year old male with history of diabetes, high cholesterol, hypertension who presents the ED with left-sided weakness. Code stroke upon arrival. Head CT upon arrival normal. Recent stroke work-up about a week ago or so which was normal. Followed up with neurologist to  his can have patient seen by vascular surgery and or neurosurgeon about his carotid disease. Has had this left-sided weakness for several days. Outside the window for TPA. LVO negative. Neurology recommends MRI and if unremarkable discharge with neurology follow-up. Lab work thus far shows no significant anemia, electrolyte abnormality, kidney injury. Mild hyperglycemia will give IV fluids. Overall has effort dependent exam and appears to have some left-sided weakness but no visual field deficit, no numbness. No obvious facial droop. Could be functional.  MRI is negative.  Lab work overall unremarkable.  Suspect possibly inner ear issue versus possible functional process.  However he has a follow-up with vascular surgery in place as well.  Given return precautions.  Will refer to ENT.  Discharged in good condition.  Was able to ambulate.  This chart was dictated using voice recognition software.  Despite best efforts to proofread,  errors can occur which can change the documentation meaning.    Final Clinical Impression(s) / ED Diagnoses Final diagnoses:  Left-sided weakness  Dizziness    Rx / DC Orders ED Discharge Orders    None       Lennice Sites, DO 07/15/20 1805

## 2020-07-15 NOTE — ED Triage Notes (Signed)
Pt arrives via EMS from home with complaints of facial droop, left side weakness, slurred speech and headache.  LSN 1030 Saturday (07/15/2020).

## 2020-07-18 NOTE — Progress Notes (Signed)
Cardiology Office Note   Date:  07/25/2020   ID:  [name] Sites., DOB 08-14-1946, MRN [id number]  PCP:  [name] Rim, MD  Cardiologist:  Former patient of Dr. Bronson Ing; to follow with Dr. Percival Spanish in [place] going forward EP: None  Chief Complaint  Patient presents with  . Hospitalization Follow-up      History of Present Illness: [name] [name]. is a 74 y.o. male with a PMH of CAD s/p angioplasty in early 1990s, HTN, HLD, mild carotid artery disease, DM type 2, and recent hospitalization for left-sided weakness who presents for post-hospital follow-up.   He was last evaluated by cardiology at an outpatient visit with Dr. Bronson Ing 12/2019, at which time he was doing well from a cardiac standpoint. BP was mildly elevated and lisinopril increased to 10mg  daily. He was reportedly intolerant to multiple statins and deferred PSK9-inhibitors. His last ischemic evaluation was a NST 11/2018 which was a low risk study with a medium defect of moderate severity in the mid-inferoseptal and mid-inferior location which was felt to be 2/2 soft tissue attenuation; EF 54%.   Since that time he was recently admitted to [facility] from 07/04/20-07/06/20 after presenting with acute left arm weakness. CT Head and MRI brain were negative for acute findings. Bilateral carotid dopplers showed mild bilateral carotid artery disease. Echo showed EF 55-60%, G1DD, mild to moderate LVH, mild LAE, negative bubble study, and no significant valvular abnormalities. 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 He was started on low dose atorvastatin in addition to home fenofibrate for elevated triglycerides. He was subsequently seen in the ED 07/15/20 for similar complaints of left arm weakness with possible facial droop. Code stroke activated and he underwent a CT head and MRI brain which were again  negative.   He presents today for post-hospital follow-up with his son. He continues to struggle with left sided weakness. Following with neurology outpatient with plans for a focused thin-slice MRI of the brain to further evaluate symptoms c/f CVA. From a cardiac standpoint he has been doing well. He does reports some DOE, though attributes this to deconditioning as he has been less active over the past month due to gait instability/weakness. No complaints of chest pain, SOB at rest, palpitations, orthopnea, LE edema, PND, or syncope. He does have occasional orthostatic symptoms when going from sitting to standing. We discussed orthostatic precautions and use of compression stockings. Son reports occasional HR in the upper 30s to 40s though patient does not recall any specific symptoms associated with these findings.     Past Medical History:  Diagnosis Date  . CAD (coronary artery disease)   . Cancer ([place])   . Diabetes ([place])   . Dyslipidemia   . Gout   . History of kidney stones   . HTN (hypertension)   . Myocardial infarction [facility])     Past Surgical History:  Procedure Laterality Date  . ANTERIOR CERVICAL DECOMP/DISCECTOMY FUSION Left 10/20/2019   Procedure: Left Cervical Three-Four Cervical Four-Five Cervical Five-Six Anterior cervical decompression/discectomy/fusion;  Surgeon: Judith Part, MD;  Location: [place];  Service: Neurosurgery;  Laterality: Left;  Left Cervical Three-Four Cervical Four-Five Cervical Five-Six Anterior cervical decompression/discectomy/fusion  . BACK SURGERY    . left knee surgery    . left shoulder surgery    . right knee surgery       Current Outpatient Medications  Medication Sig  Dispense Refill  . aspirin EC 81 MG tablet Take 1 tablet (81 mg total) by mouth daily. Restart on 10/28/2019 30 tablet   . atorvastatin (LIPITOR) 20 MG tablet Take 1 tablet by mouth daily.    Marland Kitchen b complex vitamins tablet Take 1 tablet by mouth daily.    . colchicine 0.6  MG tablet Take 0.6 mg by mouth daily as needed (gout flare).    . cyclobenzaprine (FLEXERIL) 10 MG tablet Take 1 tablet (10 mg total) by mouth 3 (three) times daily as needed for muscle spasms. 30 tablet 0  . diclofenac Sodium (VOLTAREN) 1 % GEL Apply topically.    . gabapentin (NEURONTIN) 300 MG capsule Take 1 capsule by mouth in the morning, at noon, and at bedtime. For 15 days    . glipiZIDE (GLUCOTROL) 5 MG tablet Take 5 mg by mouth 2 (two) times daily before a meal.     . LIFESCAN FINEPOINT LANCETS MISC Use to check blood sugar 3 time(s) daily    . metFORMIN (GLUCOPHAGE) 1000 MG tablet Take 1,000 mg by mouth daily with breakfast.    . metoprolol succinate (TOPROL-XL) 50 MG 24 hr tablet Take 1 tablet (50 mg total) by mouth daily. Take with or immediately following a meal. 90 tablet 1  . Omega-3 Fatty Acids (FISH OIL) 1000 MG CAPS Take 1,000 mg by mouth 2 (two) times daily.     Marland Kitchen omeprazole (PRILOSEC) 20 MG capsule Take 20 mg by mouth daily.   2  . tamsulosin (FLOMAX) 0.4 MG CAPS capsule Take 0.4 mg by mouth at bedtime.     . vitamin E 400 UNIT capsule Take 400 Units by mouth daily.     No current facility-administered medications for this visit.    Allergies:   Simvastatin    Social History:  The patient  reports that he quit smoking about 31 years ago. His smoking use included cigarettes. He started smoking about 67 years ago. He has a 180.00 pack-year smoking history. His smokeless tobacco use includes snuff. He reports that he does not drink alcohol and does not use drugs.   Family History:  The patient's family history includes Colon cancer (age of onset: 56) in his brother; Heart attack (age of onset: 98) in his father.    ROS:  Please see the history of present illness.   Otherwise, review of systems are positive for none.   All other systems are reviewed and negative.    PHYSICAL EXAM: VS:  BP (!) 108/52   Pulse 70   Ht 5\' 9"  (1.753 m)   Wt 217 lb 6.4 oz (98.6 kg)   SpO2  95%   BMI 32.10 kg/m  , BMI Body mass index is 32.1 kg/m. GEN: Well nourished, well developed, in no acute distress HEENT: sclera anicteric Neck: no JVD, carotid bruits, or masses Cardiac: RRR; no murmurs, rubs, or gallops, no edema  Respiratory:  clear to auscultation bilaterally, normal work of breathing GI: soft, obese, nontender, nondistended, + BS MS: no deformity or atrophy Skin: warm and dry, no rash Neuro:  Mild left sided weakness Psych: euthymic mood, full affect   EKG:  EKG is ordered today. The ekg ordered today demonstrates sinus rhythm with PVC, rate 70 bpm, RBBB, LAFB, no STE/D, no TWI; no significant change from previous.   Recent Labs: 07/15/2020: ALT 17; BUN 30; Creatinine, Ser 0.90; Hemoglobin 14.3; Platelets 255; Potassium 4.8; Sodium 134    Lipid Panel No results found for: CHOL,  TRIG, HDL, CHOLHDL, VLDL, LDLCALC, LDLDIRECT    Wt Readings from Last 3 Encounters:  07/25/20 217 lb 6.4 oz (98.6 kg)  07/15/20 225 lb (102.1 kg)  12/28/19 218 lb (98.9 kg)      Other studies Reviewed: Additional studies/ records that were reviewed today include:   Echocardiogram 07/05/20: Summary  1. The left ventricle is normal in size with mildly to moderately increased  wall thickness.  2. The left ventricular systolic function is normal, LVEF is visually  estimated at 55-60%.  3. There is grade I diastolic dysfunction (impaired relaxation).  4. The left atrium is mildly dilated in size.  5. The right ventricle is normal in size, with normal systolic function.  6. Negative bubble study.   Carotid duplex 07/06/20: IMPRESSION:  1. Mild atherosclerotic disease involvingbilateral carotid  arteries. Estimated degree of stenosis in the internal carotid  arteries is less than 50% bilaterally.  2. Antegrade flow in the right vertebral artery. Left vertebral  artery is not visualized but this vessel was patent on the recent  CTA examination.     ASSESSMENT  AND PLAN:  1. CAD s/p remote angioplasty: no anginal complaints.  - Continue aspirin and statin - Continue BBlocker  2. HTN: BP 108/52 today - Will decrease metoprolol succinate to 50mg  daily given orthostatic symptoms - Encouraged orthostatic precautions; trial compression stockings  3. Bradycardia: occasional HR in the upper 30s-40s. Asymptomatic - Will decrease metoprolol succinate to 50mg  daily.  4. HLD: unable to calculate LDL on last lipid panel 06/2020 due to triglycerides of 512. He was restarted on atorvastatin at that time and is tolerating without issues - Continue atorvastatin and fenofibrate - Will repeat FLP/LFTs in 4-6 weeks  4. Carotid artery disease: mild on doppler during recent admission 06/2020.  - Continue aspirin and statin - Anticipate annual monitoring for progression of disease  5. DM type 2: A1C 7.8 06/2020; goal <7 - Continue metformin  6. Left sided weakness: following outpatient with neurology for ongoing CVA work-up. Anticipating MRI tomorrow - Continue management per neurology   Current medicines are reviewed at length with the patient today.  The patient does not have concerns regarding medicines.  The following changes have been made:  As above  Labs/ tests ordered today include:   Orders Placed This Encounter  Procedures  . Hepatic function panel  . Lipid panel  . EKG 12-Lead     Disposition:   FU with Dr. Percival Spanish as scheduled 12/2020  Signed, Abigail Butts, PA-C  07/25/2020 9:46 AM

## 2020-07-25 ENCOUNTER — Ambulatory Visit (INDEPENDENT_AMBULATORY_CARE_PROVIDER_SITE_OTHER): Payer: Medicare HMO | Admitting: Medical

## 2020-07-25 ENCOUNTER — Encounter: Payer: Self-pay | Admitting: Medical

## 2020-07-25 ENCOUNTER — Other Ambulatory Visit: Payer: Self-pay

## 2020-07-25 VITALS — BP 108/52 | HR 70 | Ht 69.0 in | Wt 217.4 lb

## 2020-07-25 DIAGNOSIS — I10 Essential (primary) hypertension: Secondary | ICD-10-CM | POA: Diagnosis not present

## 2020-07-25 DIAGNOSIS — E785 Hyperlipidemia, unspecified: Secondary | ICD-10-CM | POA: Diagnosis not present

## 2020-07-25 DIAGNOSIS — I6523 Occlusion and stenosis of bilateral carotid arteries: Secondary | ICD-10-CM | POA: Diagnosis not present

## 2020-07-25 DIAGNOSIS — E119 Type 2 diabetes mellitus without complications: Secondary | ICD-10-CM

## 2020-07-25 DIAGNOSIS — I2510 Atherosclerotic heart disease of native coronary artery without angina pectoris: Secondary | ICD-10-CM

## 2020-07-25 MED ORDER — METOPROLOL SUCCINATE ER 50 MG PO TB24
50.0000 mg | ORAL_TABLET | Freq: Every day | ORAL | 1 refills | Status: DC
Start: 2020-07-25 — End: 2021-04-04

## 2020-07-25 NOTE — Patient Instructions (Signed)
Medication Instructions:   DECREASE Metoprolol Succinate (Toprol-XL) to 50 mg daily  *If you need a refill on your cardiac medications before your next appointment, please call your pharmacy*   Lab Work: Your physician recommends that you return for lab work in 1-2 months at the [place] office:   Fasting Lipid Panel-DO NOT EAT OR DRINK PAST MIDNIGHT. OKAY TO HAVE WATER.  Hepatic (Liver) Function Test  If you have labs (blood work) drawn today and your tests are completely normal, you will receive your results only by: Marland Kitchen MyChart Message (if you have MyChart) OR . A paper copy in the mail If you have any lab test that is abnormal or we need to change your treatment, we will call you to review the results.  Testing/Procedures: NONE ordered at this time of appointment   Follow-Up: At [facility], you and your health needs are our priority.  As part of our continuing mission to provide you with exceptional heart care, we have created designated Provider Care Teams.  These Care Teams include your primary Cardiologist (physician) and Advanced Practice Providers (APPs -  Physician Assistants and Nurse Practitioners) who all work together to provide you with the care you need, when you need it.  We recommend signing up for the patient portal called "MyChart".  Sign up information is provided on this After Visit Summary.  MyChart is used to connect with patients for Virtual Visits (Telemedicine).  Patients are able to view lab/test results, encounter notes, upcoming appointments, etc.  Non-urgent messages can be sent to your provider as well.   To learn more about what you can do with MyChart, go to [email or web address].    Your next appointment:   As schedule    The format for your next appointment:   In Person  Provider:   Minus Breeding, MD   Other Instructions

## 2020-09-15 ENCOUNTER — Emergency Department (HOSPITAL_COMMUNITY): Payer: Medicare HMO

## 2020-09-15 ENCOUNTER — Inpatient Hospital Stay (HOSPITAL_COMMUNITY)
Admission: EM | Admit: 2020-09-15 | Discharge: 2020-09-21 | DRG: 516 | Disposition: A | Discharge status: Rehabilitation Facility | Payer: Medicare HMO | Attending: Neurological Surgery | Admitting: Neurological Surgery

## 2020-09-15 ENCOUNTER — Inpatient Hospital Stay (HOSPITAL_COMMUNITY): Payer: Medicare HMO

## 2020-09-15 ENCOUNTER — Encounter (HOSPITAL_COMMUNITY): Payer: Self-pay | Admitting: Emergency Medicine

## 2020-09-15 ENCOUNTER — Other Ambulatory Visit: Payer: Self-pay

## 2020-09-15 DIAGNOSIS — G959 Disease of spinal cord, unspecified: Secondary | ICD-10-CM | POA: Diagnosis present

## 2020-09-15 DIAGNOSIS — Z6832 Body mass index (BMI) 32.0-32.9, adult: Secondary | ICD-10-CM

## 2020-09-15 DIAGNOSIS — E871 Hypo-osmolality and hyponatremia: Secondary | ICD-10-CM | POA: Diagnosis present

## 2020-09-15 DIAGNOSIS — G729 Myopathy, unspecified: Secondary | ICD-10-CM | POA: Diagnosis present

## 2020-09-15 DIAGNOSIS — I2510 Atherosclerotic heart disease of native coronary artery without angina pectoris: Secondary | ICD-10-CM | POA: Diagnosis present

## 2020-09-15 DIAGNOSIS — Z9861 Coronary angioplasty status: Secondary | ICD-10-CM

## 2020-09-15 DIAGNOSIS — Z419 Encounter for procedure for purposes other than remedying health state, unspecified: Secondary | ICD-10-CM

## 2020-09-15 DIAGNOSIS — I10 Essential (primary) hypertension: Secondary | ICD-10-CM | POA: Diagnosis present

## 2020-09-15 DIAGNOSIS — G549 Nerve root and plexus disorder, unspecified: Secondary | ICD-10-CM | POA: Diagnosis present

## 2020-09-15 DIAGNOSIS — F32A Depression, unspecified: Secondary | ICD-10-CM | POA: Diagnosis present

## 2020-09-15 DIAGNOSIS — R531 Weakness: Secondary | ICD-10-CM | POA: Diagnosis present

## 2020-09-15 DIAGNOSIS — M109 Gout, unspecified: Secondary | ICD-10-CM | POA: Diagnosis not present

## 2020-09-15 DIAGNOSIS — E785 Hyperlipidemia, unspecified: Secondary | ICD-10-CM | POA: Diagnosis present

## 2020-09-15 DIAGNOSIS — R296 Repeated falls: Secondary | ICD-10-CM | POA: Diagnosis present

## 2020-09-15 DIAGNOSIS — Z981 Arthrodesis status: Secondary | ICD-10-CM | POA: Diagnosis not present

## 2020-09-15 DIAGNOSIS — E1142 Type 2 diabetes mellitus with diabetic polyneuropathy: Secondary | ICD-10-CM | POA: Diagnosis present

## 2020-09-15 DIAGNOSIS — I252 Old myocardial infarction: Secondary | ICD-10-CM

## 2020-09-15 DIAGNOSIS — H9191 Unspecified hearing loss, right ear: Secondary | ICD-10-CM | POA: Diagnosis present

## 2020-09-15 DIAGNOSIS — Z720 Tobacco use: Secondary | ICD-10-CM

## 2020-09-15 DIAGNOSIS — Z8249 Family history of ischemic heart disease and other diseases of the circulatory system: Secondary | ICD-10-CM | POA: Diagnosis not present

## 2020-09-15 DIAGNOSIS — R2689 Other abnormalities of gait and mobility: Secondary | ICD-10-CM | POA: Diagnosis not present

## 2020-09-15 DIAGNOSIS — M4802 Spinal stenosis, cervical region: Secondary | ICD-10-CM | POA: Diagnosis present

## 2020-09-15 DIAGNOSIS — G450 Vertebro-basilar artery syndrome: Secondary | ICD-10-CM | POA: Diagnosis present

## 2020-09-15 DIAGNOSIS — Z79899 Other long term (current) drug therapy: Secondary | ICD-10-CM

## 2020-09-15 DIAGNOSIS — Z20822 Contact with and (suspected) exposure to covid-19: Secondary | ICD-10-CM | POA: Diagnosis present

## 2020-09-15 DIAGNOSIS — Z87442 Personal history of urinary calculi: Secondary | ICD-10-CM | POA: Diagnosis not present

## 2020-09-15 DIAGNOSIS — Z7984 Long term (current) use of oral hypoglycemic drugs: Secondary | ICD-10-CM | POA: Diagnosis not present

## 2020-09-15 DIAGNOSIS — Z888 Allergy status to other drugs, medicaments and biological substances status: Secondary | ICD-10-CM | POA: Diagnosis not present

## 2020-09-15 DIAGNOSIS — M2578 Osteophyte, vertebrae: Secondary | ICD-10-CM | POA: Diagnosis present

## 2020-09-15 DIAGNOSIS — K5900 Constipation, unspecified: Secondary | ICD-10-CM | POA: Diagnosis present

## 2020-09-15 DIAGNOSIS — M7918 Myalgia, other site: Secondary | ICD-10-CM | POA: Diagnosis not present

## 2020-09-15 LAB — CBC
HCT: 43.4 % (ref 39.0–52.0)
Hemoglobin: 13.9 g/dL (ref 13.0–17.0)
MCH: 28.1 pg (ref 26.0–34.0)
MCHC: 32 g/dL (ref 30.0–36.0)
MCV: 87.7 fL (ref 80.0–100.0)
Platelets: 259 10*3/uL (ref 150–400)
RBC: 4.95 MIL/uL (ref 4.22–5.81)
RDW: 13.7 % (ref 11.5–15.5)
WBC: 7.5 10*3/uL (ref 4.0–10.5)
nRBC: 0 % (ref 0.0–0.2)

## 2020-09-15 LAB — URINALYSIS, ROUTINE W REFLEX MICROSCOPIC
Bacteria, UA: NONE SEEN
Bilirubin Urine: NEGATIVE
Glucose, UA: NEGATIVE mg/dL
Hgb urine dipstick: NEGATIVE
Ketones, ur: NEGATIVE mg/dL
Leukocytes,Ua: NEGATIVE
Nitrite: NEGATIVE
Protein, ur: 100 mg/dL — AB
Specific Gravity, Urine: 1.017 (ref 1.005–1.030)
pH: 5 (ref 5.0–8.0)

## 2020-09-15 LAB — BASIC METABOLIC PANEL
Anion gap: 10 (ref 5–15)
BUN: 12 mg/dL (ref 8–23)
CO2: 25 mmol/L (ref 22–32)
Calcium: 9.8 mg/dL (ref 8.9–10.3)
Chloride: 101 mmol/L (ref 98–111)
Creatinine, Ser: 0.77 mg/dL (ref 0.61–1.24)
GFR, Estimated: >60 mL/min (ref >60)
Glucose, Bld: 127 mg/dL — ABNORMAL HIGH (ref 70–99)
Potassium: 4.3 mmol/L (ref 3.5–5.1)
Sodium: 136 mmol/L (ref 135–145)

## 2020-09-15 LAB — HEMOGLOBIN A1C
Hgb A1c MFr Bld: 6.9 % — ABNORMAL HIGH (ref 4.8–5.6)
Mean Plasma Glucose: 151.33 mg/dL

## 2020-09-15 LAB — RESPIRATORY PANEL BY RT PCR (FLU A&B, COVID)
Influenza A by PCR: NEGATIVE
Influenza B by PCR: NEGATIVE
SARS Coronavirus 2 by RT PCR: NEGATIVE

## 2020-09-15 LAB — CBG MONITORING, ED
Glucose-Capillary: 106 mg/dL — ABNORMAL HIGH (ref 70–99)
Glucose-Capillary: 143 mg/dL — ABNORMAL HIGH (ref 70–99)

## 2020-09-15 MED ORDER — OXYCODONE HCL 5 MG PO TABS
10.0000 mg | ORAL_TABLET | ORAL | Status: DC | PRN
  Administered 2020-09-16 – 2020-09-21 (×12): 10 mg via ORAL
  Filled 2020-09-15 (×13): qty 2

## 2020-09-15 MED ORDER — HYDROMORPHONE HCL 1 MG/ML IJ SOLN
1.0000 mg | INTRAMUSCULAR | Status: DC | PRN
  Administered 2020-09-17 – 2020-09-18 (×3): 1 mg via INTRAVENOUS
  Filled 2020-09-15 (×3): qty 1

## 2020-09-15 MED ORDER — MENTHOL 3 MG MT LOZG
1.0000 | LOZENGE | OROMUCOSAL | Status: DC | PRN
  Filled 2020-09-15: qty 9

## 2020-09-15 MED ORDER — ONDANSETRON HCL 4 MG PO TABS: 4.0000 mg | ORAL_TABLET | Freq: Four times a day (QID) | ORAL | Status: DC | PRN

## 2020-09-15 MED ORDER — IOHEXOL 300 MG/ML  SOLN
100.0000 mL | Freq: Once | INTRAMUSCULAR | Status: AC | PRN
  Administered 2020-09-15: 100 mL via INTRAVENOUS

## 2020-09-15 MED ORDER — INSULIN ASPART 100 UNIT/ML ~~LOC~~ SOLN: 0.0000 [IU] | Freq: Every day | SUBCUTANEOUS | Status: DC

## 2020-09-15 MED ORDER — OXYCODONE HCL 5 MG PO TABS: 5.0000 mg | ORAL_TABLET | ORAL | Status: DC | PRN

## 2020-09-15 MED ORDER — ONDANSETRON HCL 4 MG/2ML IJ SOLN: 4.0000 mg | Freq: Four times a day (QID) | INTRAMUSCULAR | Status: DC | PRN

## 2020-09-15 MED ORDER — INSULIN ASPART 100 UNIT/ML ~~LOC~~ SOLN
0.0000 [IU] | Freq: Three times a day (TID) | SUBCUTANEOUS | Status: DC
  Administered 2020-09-20: 3 [IU] via SUBCUTANEOUS
  Administered 2020-09-20 – 2020-09-21 (×3): 5 [IU] via SUBCUTANEOUS

## 2020-09-15 MED ORDER — GABAPENTIN 300 MG PO CAPS
300.0000 mg | ORAL_CAPSULE | Freq: Three times a day (TID) | ORAL | Status: DC
  Administered 2020-09-15 – 2020-09-21 (×17): 300 mg via ORAL
  Filled 2020-09-15 (×17): qty 1

## 2020-09-15 MED ORDER — ACETAMINOPHEN 650 MG RE SUPP: 650.0000 mg | RECTAL | Status: DC | PRN

## 2020-09-15 MED ORDER — TAMSULOSIN HCL 0.4 MG PO CAPS
0.4000 mg | ORAL_CAPSULE | Freq: Every day | ORAL | Status: DC
  Administered 2020-09-15 – 2020-09-20 (×6): 0.4 mg via ORAL
  Filled 2020-09-15 (×7): qty 1

## 2020-09-15 MED ORDER — PHENOL 1.4 % MT LIQD
1.0000 | OROMUCOSAL | Status: DC | PRN
  Filled 2020-09-15: qty 177

## 2020-09-15 MED ORDER — PANTOPRAZOLE SODIUM 40 MG PO TBEC
40.0000 mg | DELAYED_RELEASE_TABLET | Freq: Every day | ORAL | Status: DC
  Administered 2020-09-16 – 2020-09-21 (×5): 40 mg via ORAL
  Filled 2020-09-15 (×5): qty 1

## 2020-09-15 MED ORDER — SODIUM CHLORIDE 0.9 % IV SOLN: INTRAVENOUS | Status: DC

## 2020-09-15 MED ORDER — DOCUSATE SODIUM 100 MG PO CAPS
100.0000 mg | ORAL_CAPSULE | Freq: Two times a day (BID) | ORAL | Status: DC
  Administered 2020-09-16 – 2020-09-21 (×11): 100 mg via ORAL
  Filled 2020-09-15 (×11): qty 1

## 2020-09-15 MED ORDER — CYCLOBENZAPRINE HCL 10 MG PO TABS
10.0000 mg | ORAL_TABLET | Freq: Three times a day (TID) | ORAL | Status: DC | PRN
  Administered 2020-09-17 – 2020-09-21 (×11): 10 mg via ORAL
  Filled 2020-09-15 (×11): qty 1

## 2020-09-15 MED ORDER — ATORVASTATIN CALCIUM 10 MG PO TABS
20.0000 mg | ORAL_TABLET | Freq: Every day | ORAL | Status: DC
  Administered 2020-09-16: 20 mg via ORAL
  Filled 2020-09-15: qty 2

## 2020-09-15 MED ORDER — METOPROLOL SUCCINATE ER 50 MG PO TB24
50.0000 mg | ORAL_TABLET | Freq: Every day | ORAL | Status: DC
  Administered 2020-09-16 – 2020-09-21 (×5): 50 mg via ORAL
  Filled 2020-09-15 (×5): qty 1

## 2020-09-15 MED ORDER — POLYETHYLENE GLYCOL 3350 17 G PO PACK: 17.0000 g | PACK | Freq: Every day | ORAL | Status: DC | PRN

## 2020-09-15 MED ORDER — ACETAMINOPHEN 325 MG PO TABS
650.0000 mg | ORAL_TABLET | ORAL | Status: DC | PRN
  Administered 2020-09-16 – 2020-09-17 (×2): 650 mg via ORAL
  Filled 2020-09-15 (×2): qty 2

## 2020-09-15 NOTE — H&P (Signed)
Neurosurgery H&P  CC: Falls, left sided weakness, episodic dizziness  HPI: This is a 74 y.o. 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 He now presents with progressive left sided weakness along with left sided facial numbness / flushing / tingling and episodes of pre-syncopal symptoms with or without syncope. As I recall, we discussed the syncopal sx in clinic in the past and he deferred workup at that time. For the left sided weakness, he has had a few stroke workups that were negative, given the left sided facial symptoms. For the past few months, he's noticed some milder symptoms, but in the past few weeks it has progressed more rapidly and he is no longer able to ambulate due to falling to the left. He feels weak on his left, but his chief complaint is really the falling. Most recently, he awoke on the floor after falling over. On ROS, he does endorse pre-syncopal Sx when he rotates his head to the left. He has a strong history of vascular disease including some known vertebral stenosis. From a surgical perspective, he did well post-op with resolution of his preop symptoms. He states that these new symptoms feel different than his prior symptoms from preop.   ROS: A 14 point ROS was performed and is negative except as noted in the HPI.   PMHx:  Past Medical History:  Diagnosis Date  . CAD (coronary artery disease)   . Cancer ([place])   . Diabetes ([place])   . Dyslipidemia   . Gout   . History of kidney stones   . HTN (hypertension)   . Myocardial infarction ([place])    FamHx:  Family History  Problem Relation Age of Onset  . Heart attack Father 73  . Colon cancer Brother 6   SocHx:  reports that he quit smoking about 31 years ago. His smoking use included cigarettes. He started smoking about 67 years ago. He has a 180.00 pack-year smoking history. His smokeless tobacco use includes snuff. He reports that he does not drink alcohol and does not use drugs.  Exam: Vital  signs in last 24 hours: Temp:  [97.7 F (36.5 C)] 97.7 F (36.5 C) (11/05 1323) Pulse Rate:  [66-81] 66 (11/05 1600) Resp:  [13-21] 21 (11/05 1600) BP: (150-161)/(70-82) 156/82 (11/05 1600) SpO2:  [99 %-100 %] 100 % (11/05 1600) Weight:  [100.7 kg] 100.7 kg (11/05 1323) General: Awake, alert, cooperative, lying in bed in NAD Head: Normocephalic and atruamatic HEENT: decreased ROM, otherwise WNL Pulmonary: breathing room air comfortably, no evidence of increased work of breathing Cardiac: RRR Abdomen: S NT ND Extremities: Warm and well perfused x4 Neuro: AOx3, PERRL, EOMI, FS & SS Strength 5/5 on R, 4/5 on L, no hoffman's, no clonus, reflexes 1+ diffusely   Assessment and Plan: 74 y.o. man with h/o cervical myleopathy s/p ACDF with good post-op course, now with progressive left sided weakness. History notable for always falling preferentially to the left side and not forward/back as well as the ability to provoke some pre-syncopal Sx by head version to the left more than right.  -MRI brain & C-spine w/o contrast -CTA head & neck with patient's head turned to the left -given his rapid decline, will need a diagnosis, will admit for further workup  Judith Part, MD 09/15/20 4:42 PM [facility]

## 2020-09-15 NOTE — ED Notes (Signed)
Provided pt with urinal and informed him that sample is needed for UA

## 2020-09-15 NOTE — ED Triage Notes (Signed)
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 Pt states this has slowly gotten worse to the point now where he has a very hard time getting around and doing his daily activities has had multiple falls at home.  He is alert and oriented x4.

## 2020-09-15 NOTE — ED Notes (Signed)
Pt given saltine crackers, cheeses, & diet ginger ale per MD permission for pt to eat/drink

## 2020-09-15 NOTE — ED Provider Notes (Signed)
[facility] EMERGENCY DEPARTMENT Provider Note   CSN: [id number] Arrival date & time: 09/15/20  1321     History Chief Complaint  Patient presents with  . Weakness    [name] Franta. is a 74 y.o. male.  The history is provided by the patient and medical records.  Neurologic Problem This is a recurrent problem. The current episode started more than 1 week ago. The problem occurs constantly. The problem has been gradually worsening. Pertinent negatives include no chest pain, no abdominal pain, no headaches and no shortness of breath. Nothing aggravates the symptoms. Nothing relieves the symptoms. He has tried nothing for the symptoms. The treatment provided no relief.       Past Medical History:  Diagnosis Date  . CAD (coronary artery disease)   . Cancer ([place])   . Diabetes ([place])   . Dyslipidemia   . Gout   . History of kidney stones   . HTN (hypertension)   . Myocardial infarction [facility])     Patient Active Problem List   Diagnosis Date Noted  . Cervical myelopathy ([place]) 10/20/2019  . SOB (shortness of breath) on exertion 07/22/2013  . CAD S/P percutaneous coronary angioplasty 07/09/2013  . HTN (hypertension) 07/09/2013  . Hyperlipidemia 07/09/2013  . Chest pain 07/09/2013    Past Surgical History:  Procedure Laterality Date  . ANTERIOR CERVICAL DECOMP/DISCECTOMY FUSION Left 10/20/2019   Procedure: Left Cervical Three-Four Cervical Four-Five Cervical Five-Six Anterior cervical decompression/discectomy/fusion;  Surgeon: Judith Part, MD;  Location: [facility];  Service: Neurosurgery;  Laterality: Left;  Left Cervical Three-Four Cervical Four-Five Cervical Five-Six Anterior cervical decompression/discectomy/fusion  . BACK SURGERY    . left knee surgery    . left shoulder surgery    . right knee surgery         Family History  Problem Relation Age of Onset  . Heart attack Father 56  . Colon cancer Brother 79    Social History   Tobacco  Use  . Smoking status: Former Smoker    Packs/day: 5.00    Years: 36.00    Pack years: 180.00    Types: Cigarettes    Start date: 11/24/1952    Quit date: 11/24/1988    Years since quitting: 31.8  . Smokeless tobacco: Current User    Types: Snuff  Vaping Use  . Vaping Use: Never used  Substance Use Topics  . Alcohol use: No    Alcohol/week: 0.0 standard drinks  . Drug use: No    Home Medications Prior to Admission medications   Medication Sig Start Date End Date Taking? Authorizing Provider  aspirin EC 81 MG tablet Take 1 tablet (81 mg total) by mouth daily. Restart on 10/28/2019 10/21/19   Judith Part, MD  atorvastatin (LIPITOR) 20 MG tablet Take 1 tablet by mouth daily. 07/06/20   [provider]  b complex vitamins tablet Take 1 tablet by mouth daily.    [provider]  colchicine 0.6 MG tablet Take 0.6 mg by mouth daily as needed (gout flare).    [provider]  cyclobenzaprine (FLEXERIL) 10 MG tablet Take 1 tablet (10 mg total) by mouth 3 (three) times daily as needed for muscle spasms. 10/21/19   Judith Part, MD  diclofenac Sodium (VOLTAREN) 1 % GEL Apply topically. 11/16/19   [provider]  gabapentin (NEURONTIN) 300 MG capsule Take 1 capsule by mouth in the morning, at noon, and at bedtime. For 15 days 07/19/20  08/03/20  [provider]  glipiZIDE (GLUCOTROL) 5 MG tablet Take 5 mg by mouth 2 (two) times daily before a meal.     [provider]  LIFESCAN FINEPOINT LANCETS MISC Use to check blood sugar 3 time(s) daily 10/06/17   [provider]  metFORMIN (GLUCOPHAGE) 1000 MG tablet Take 1,000 mg by mouth daily with breakfast.    [provider]  metoprolol succinate (TOPROL-XL) 50 MG 24 hr tablet Take 1 tablet (50 mg total) by mouth daily. Take with or immediately following a meal. 07/25/20   Kroeger, Lorelee Cover., PA-C  Omega-3 Fatty Acids (FISH OIL) 1000 MG CAPS Take 1,000 mg by mouth 2 (two)  times daily.     [provider]  omeprazole (PRILOSEC) 20 MG capsule Take 20 mg by mouth daily.  10/02/16   [provider]  tamsulosin (FLOMAX) 0.4 MG CAPS capsule Take 0.4 mg by mouth at bedtime.  01/17/15   [provider]  vitamin E 400 UNIT capsule Take 400 Units by mouth daily.    [provider]    Allergies    Simvastatin  Review of Systems   Review of Systems  Constitutional: Negative for chills, diaphoresis, fatigue and fever.  HENT: Negative for congestion.   Eyes: Negative for photophobia and visual disturbance.  Respiratory: Negative for chest tightness and shortness of breath.   Cardiovascular: Negative for chest pain, palpitations and leg swelling.  Gastrointestinal: Negative for abdominal pain, constipation, diarrhea, nausea and vomiting.  Genitourinary: Negative for dysuria and flank pain.  Musculoskeletal: Negative for back pain, neck pain and neck stiffness.  Skin: Negative for rash and wound.  Neurological: Positive for weakness, light-headedness and numbness. Negative for dizziness, facial asymmetry and headaches.  Psychiatric/Behavioral: Negative for agitation and confusion.  All other systems reviewed and are negative.   Physical Exam Updated Vital Signs BP (!) 161/77   Pulse 81   Temp 97.7 F (36.5 C) (Oral)   Resp 16   Ht 5\' 9"  (1.753 m)   Wt 100.7 kg   SpO2 99%   BMI 32.78 kg/m   Physical Exam Vitals and nursing note reviewed.  Constitutional:      General: He is not in acute distress.    Appearance: He is well-developed. He is not ill-appearing, toxic-appearing or diaphoretic.  HENT:     Head: Normocephalic and atraumatic.     Nose: No congestion or rhinorrhea.     Mouth/Throat:     Mouth: Mucous membranes are moist.     Pharynx: No oropharyngeal exudate or posterior oropharyngeal erythema.  Eyes:     Conjunctiva/sclera: Conjunctivae normal.     Pupils: Pupils are equal, round, and reactive to light.    Cardiovascular:     Rate and Rhythm: Normal rate and regular rhythm.     Pulses: Normal pulses.     Heart sounds: No murmur heard.   Pulmonary:     Effort: Pulmonary effort is normal. No respiratory distress.     Breath sounds: Normal breath sounds. No wheezing, rhonchi or rales.  Chest:     Chest wall: No tenderness.  Abdominal:     General: Abdomen is flat.     Palpations: Abdomen is soft.     Tenderness: There is no abdominal tenderness. There is no right CVA tenderness, left CVA tenderness, guarding or rebound.  Musculoskeletal:        General: No tenderness.     Cervical back: Neck supple. No tenderness.  Right lower leg: No edema.     Left lower leg: No edema.  Skin:    General: Skin is warm and dry.     Capillary Refill: Capillary refill takes less than 2 seconds.     Findings: No erythema.  Neurological:     Mental Status: He is alert and oriented to person, place, and time.     GCS: GCS eye subscore is 4. GCS verbal subscore is 5. GCS motor subscore is 6.     Cranial Nerves: No dysarthria or facial asymmetry.     Sensory: Sensory deficit present.     Motor: Weakness present. No tremor.     Coordination: Coordination normal.     Comments: Numbness in left face, left arm, left leg compared to right.  Weakness in left arm and left leg compared to right.  Hoffmann sign negative.  No clonus present.  Psychiatric:        Mood and Affect: Mood normal.     ED Results / Procedures / Treatments   Labs (all labs ordered are listed, but only abnormal results are displayed) Labs Reviewed  BASIC METABOLIC PANEL - Abnormal; Notable for the following components:      Result Value   Glucose, Bld 127 (*)    All other components within normal limits  URINALYSIS, ROUTINE W REFLEX MICROSCOPIC - Abnormal; Notable for the following components:   Color, Urine AMBER (*)    APPearance CLOUDY (*)    Protein, ur 100 (*)    All other components within normal limits  HEMOGLOBIN A1C -  Abnormal; Notable for the following components:   Hgb A1c MFr Bld 6.9 (*)    All other components within normal limits  CBG MONITORING, ED - Abnormal; Notable for the following components:   Glucose-Capillary 106 (*)    All other components within normal limits  CBG MONITORING, ED - Abnormal; Notable for the following components:   Glucose-Capillary 143 (*)    All other components within normal limits  RESPIRATORY PANEL BY RT PCR (FLU A&B, COVID)  CBC    EKG None  Radiology CT ANGIO HEAD W OR WO CONTRAST  Result Date: 09/15/2020 CLINICAL DATA:  Dizziness. Vertebrobasilar insufficiency when looking to the left EXAM: CT ANGIOGRAPHY HEAD AND NECK TECHNIQUE: Multidetector CT imaging of the head and neck was performed using the standard protocol during bolus administration of intravenous contrast. Multiplanar CT image reconstructions and MIPs were obtained to evaluate the vascular anatomy. Carotid stenosis measurements (when applicable) are obtained utilizing NASCET criteria, using the distal internal carotid diameter as the denominator. CONTRAST:  159mL OMNIPAQUE IOHEXOL 300 MG/ML  SOLN COMPARISON:  MRI head and cervical spine 09/16/2019. CT head 07/15/2020 FINDINGS: CT HEAD FINDINGS Brain: Patient was scanned with head looking to the left as requested. Ventricle size and cerebral volume normal for age. Negative for acute infarct, hemorrhage, mass. Vascular: Negative for hyperdense vessel Skull: Negative Sinuses: Paranasal sinuses clear. Orbits: Negative Review of the MIP images confirms the above findings CTA NECK FINDINGS Aortic arch: Standard branching. Imaged portion shows no evidence of aneurysm or dissection. No significant stenosis of the major arch vessel origins. Right carotid system: Atherosclerotic calcification proximal right internal carotid artery narrowing the lumen to 1.5 mm corresponding to approximately 55% diameter stenosis. Left carotid system: Mild atherosclerotic disease in the  proximal left internal carotid artery. Minimal luminal diameter 2 mm corresponding to approximately 50% diameter stenosis. Vertebral arteries: Short segment occlusion versus severe stenosis of the origin of  the right vertebral artery. There is reconstitution of a small right vertebral which is patent to the basilar. There is atherosclerotic plaque in the distal right vertebral artery. Severe stenosis at the origin of the left vertebral artery which is then patent to the basilar. Skeleton: ACDF with anterior plate and screws C3 through C6. Solid interbody fusion C6-7. Cervical spondylosis. Prominent osteophyte on the left at C5-6. No acute skeletal abnormality. Other neck: Negative for mass or adenopathy. Upper chest: Lung apices clear bilaterally. Review of the MIP images confirms the above findings CTA HEAD FINDINGS Anterior circulation: Mild atherosclerotic disease in the cavernous carotid bilaterally without stenosis. Middle cerebral arteries patent bilaterally without stenosis. Moderate stenosis right A2 segment. Left anterior cerebral artery patent without stenosis. Posterior circulation: Mild to moderate stenosis distal vertebral artery bilaterally. Both vertebral arteries patent to the basilar. PICA not well visualized. Basilar tortuous and widely patent. Superior cerebellar and posterior cerebral arteries patent bilaterally. Fetal origin of the right posterior cerebral artery. No large vessel occlusion or aneurysm. Venous sinuses: Normal venous enhancement Anatomic variants: None Review of the MIP images confirms the above findings IMPRESSION: 1. No acute intracranial abnormality. 2. Severe stenosis versus short segment occlusion of the proximal right vertebral artery which appears to be due to atherosclerotic disease. Right vertebral artery reconstitutes with small lumen. Additional mild to moderate stenosis distal right vertebral artery. 3. Severe focal stenosis origin of left vertebral artery which is  patent to the basilar with mild to moderate stenosis distally. 4. 55% diameter stenosis proximal right internal carotid artery due to calcific plaque. 5. 50% diameter stenosis proximal left internal carotid artery due to out atherosclerotic plaque. 6. Moderate stenosis right A2 segment. 7. Negative for intracranial large vessel occlusion. Electronically Signed   By: Franchot Gallo M.D.   On: 09/15/2020 21:51   CT ANGIO NECK W OR WO CONTRAST  Result Date: 09/15/2020 CLINICAL DATA:  Dizziness. Vertebrobasilar insufficiency when looking to the left EXAM: CT ANGIOGRAPHY HEAD AND NECK TECHNIQUE: Multidetector CT imaging of the head and neck was performed using the standard protocol during bolus administration of intravenous contrast. Multiplanar CT image reconstructions and MIPs were obtained to evaluate the vascular anatomy. Carotid stenosis measurements (when applicable) are obtained utilizing NASCET criteria, using the distal internal carotid diameter as the denominator. CONTRAST:  177mL OMNIPAQUE IOHEXOL 300 MG/ML  SOLN COMPARISON:  MRI head and cervical spine 09/16/2019. CT head 07/15/2020 FINDINGS: CT HEAD FINDINGS Brain: Patient was scanned with head looking to the left as requested. Ventricle size and cerebral volume normal for age. Negative for acute infarct, hemorrhage, mass. Vascular: Negative for hyperdense vessel Skull: Negative Sinuses: Paranasal sinuses clear. Orbits: Negative Review of the MIP images confirms the above findings CTA NECK FINDINGS Aortic arch: Standard branching. Imaged portion shows no evidence of aneurysm or dissection. No significant stenosis of the major arch vessel origins. Right carotid system: Atherosclerotic calcification proximal right internal carotid artery narrowing the lumen to 1.5 mm corresponding to approximately 55% diameter stenosis. Left carotid system: Mild atherosclerotic disease in the proximal left internal carotid artery. Minimal luminal diameter 2 mm  corresponding to approximately 50% diameter stenosis. Vertebral arteries: Short segment occlusion versus severe stenosis of the origin of the right vertebral artery. There is reconstitution of a small right vertebral which is patent to the basilar. There is atherosclerotic plaque in the distal right vertebral artery. Severe stenosis at the origin of the left vertebral artery which is then patent to the basilar. Skeleton: ACDF with anterior  plate and screws C3 through C6. Solid interbody fusion C6-7. Cervical spondylosis. Prominent osteophyte on the left at C5-6. No acute skeletal abnormality. Other neck: Negative for mass or adenopathy. Upper chest: Lung apices clear bilaterally. Review of the MIP images confirms the above findings CTA HEAD FINDINGS Anterior circulation: Mild atherosclerotic disease in the cavernous carotid bilaterally without stenosis. Middle cerebral arteries patent bilaterally without stenosis. Moderate stenosis right A2 segment. Left anterior cerebral artery patent without stenosis. Posterior circulation: Mild to moderate stenosis distal vertebral artery bilaterally. Both vertebral arteries patent to the basilar. PICA not well visualized. Basilar tortuous and widely patent. Superior cerebellar and posterior cerebral arteries patent bilaterally. Fetal origin of the right posterior cerebral artery. No large vessel occlusion or aneurysm. Venous sinuses: Normal venous enhancement Anatomic variants: None Review of the MIP images confirms the above findings IMPRESSION: 1. No acute intracranial abnormality. 2. Severe stenosis versus short segment occlusion of the proximal right vertebral artery which appears to be due to atherosclerotic disease. Right vertebral artery reconstitutes with small lumen. Additional mild to moderate stenosis distal right vertebral artery. 3. Severe focal stenosis origin of left vertebral artery which is patent to the basilar with mild to moderate stenosis distally. 4. 55%  diameter stenosis proximal right internal carotid artery due to calcific plaque. 5. 50% diameter stenosis proximal left internal carotid artery due to out atherosclerotic plaque. 6. Moderate stenosis right A2 segment. 7. Negative for intracranial large vessel occlusion. Electronically Signed   By: Franchot Gallo M.D.   On: 09/15/2020 21:51   MR BRAIN WO CONTRAST  Result Date: 09/15/2020 CLINICAL DATA:  Acute neuro deficit. Left-sided numbness and weakness. Rule out myelopathy EXAM: MRI HEAD WITHOUT CONTRAST MRI CERVICAL SPINE WITHOUT CONTRAST TECHNIQUE: Multiplanar, multiecho pulse sequences of the brain and surrounding structures, and cervical spine, to include the craniocervical junction and cervicothoracic junction, were obtained without intravenous contrast. COMPARISON:  MRI head 07/15/2020 FINDINGS: MRI HEAD FINDINGS Brain: Mild atrophy without hydrocephalus. Negative for acute infarct. No significant chronic ischemic change. Negative for hemorrhage or mass. Vascular: Normal arterial flow voids. Skull and upper cervical spine: No acute skeletal abnormality. Sinuses/Orbits: Paranasal sinuses clear.  Negative orbit Other: None MRI CERVICAL SPINE FINDINGS Alignment: Slight anterolisthesis C7-T1 otherwise normal alignment. Straightening of the cervical lordosis. Vertebrae: Negative for fracture or mass. Hardware: ACDF with anterior plate and screws extending from C3 through C6. Solid interbody bone fusion C6-7 without hardware. Cord: Mild cord hyperintensity bilaterally at C5-6. Posterior Fossa, vertebral arteries, paraspinal tissues: Negative Disc levels: C2-3: Bilateral facet degeneration. No significant spinal or foraminal stenosis C3-4: ACDF. Diffuse posterior uncinate spurring. Cord flattening with moderate spinal stenosis. Moderate foraminal stenosis bilaterally. Bilateral facet hypertrophy contributes to foraminal narrowing. C4-5: ACDF. Posterior osteophyte with central disc protrusion which may be  chronic. Bilateral facet hypertrophy. Moderate to severe left foraminal encroachment and moderate right foraminal encroachment. Cord flattening with mild spinal stenosis. C5-6: Large central and left-sided osteophyte. Severe left foraminal encroachment. Cord flattening on the left with moderate spinal stenosis. Bilateral cord hyperintensity. Bilateral facet degeneration. Moderate right foraminal stenosis. C6-7: Solid interbody fusion.  No significant stenosis C7-T1: Disc and facet degeneration. Moderate to severe foraminal encroachment bilaterally due to spurring. Mild spinal stenosis. Disc degeneration and spurring at T1-2 and T2-3 with foraminal encroachment bilaterally. IMPRESSION: ACDF C3 through C7. Multilevel spinal and foraminal stenosis due to spurring. Large osteophyte on the left at C5-6 with severe left foraminal encroachment moderate spinal stenosis. Bilateral cord hyperintensity at C5-6. Electronically Signed  By: Franchot Gallo M.D.   On: 09/15/2020 21:01   MR Cervical Spine Wo Contrast  Result Date: 09/15/2020 CLINICAL DATA:  Acute neuro deficit. Left-sided numbness and weakness. Rule out myelopathy EXAM: MRI HEAD WITHOUT CONTRAST MRI CERVICAL SPINE WITHOUT CONTRAST TECHNIQUE: Multiplanar, multiecho pulse sequences of the brain and surrounding structures, and cervical spine, to include the craniocervical junction and cervicothoracic junction, were obtained without intravenous contrast. COMPARISON:  MRI head 07/15/2020 FINDINGS: MRI HEAD FINDINGS Brain: Mild atrophy without hydrocephalus. Negative for acute infarct. No significant chronic ischemic change. Negative for hemorrhage or mass. Vascular: Normal arterial flow voids. Skull and upper cervical spine: No acute skeletal abnormality. Sinuses/Orbits: Paranasal sinuses clear.  Negative orbit Other: None MRI CERVICAL SPINE FINDINGS Alignment: Slight anterolisthesis C7-T1 otherwise normal alignment. Straightening of the cervical lordosis.  Vertebrae: Negative for fracture or mass. Hardware: ACDF with anterior plate and screws extending from C3 through C6. Solid interbody bone fusion C6-7 without hardware. Cord: Mild cord hyperintensity bilaterally at C5-6. Posterior Fossa, vertebral arteries, paraspinal tissues: Negative Disc levels: C2-3: Bilateral facet degeneration. No significant spinal or foraminal stenosis C3-4: ACDF. Diffuse posterior uncinate spurring. Cord flattening with moderate spinal stenosis. Moderate foraminal stenosis bilaterally. Bilateral facet hypertrophy contributes to foraminal narrowing. C4-5: ACDF. Posterior osteophyte with central disc protrusion which may be chronic. Bilateral facet hypertrophy. Moderate to severe left foraminal encroachment and moderate right foraminal encroachment. Cord flattening with mild spinal stenosis. C5-6: Large central and left-sided osteophyte. Severe left foraminal encroachment. Cord flattening on the left with moderate spinal stenosis. Bilateral cord hyperintensity. Bilateral facet degeneration. Moderate right foraminal stenosis. C6-7: Solid interbody fusion.  No significant stenosis C7-T1: Disc and facet degeneration. Moderate to severe foraminal encroachment bilaterally due to spurring. Mild spinal stenosis. Disc degeneration and spurring at T1-2 and T2-3 with foraminal encroachment bilaterally. IMPRESSION: ACDF C3 through C7. Multilevel spinal and foraminal stenosis due to spurring. Large osteophyte on the left at C5-6 with severe left foraminal encroachment moderate spinal stenosis. Bilateral cord hyperintensity at C5-6. Electronically Signed   By: Franchot Gallo M.D.   On: 09/15/2020 21:01    Procedures Procedures (including critical care time)  Medications Ordered in ED Medications  atorvastatin (LIPITOR) tablet 20 mg (has no administration in time range)  metoprolol succinate (TOPROL-XL) 24 hr tablet 50 mg (has no administration in time range)  pantoprazole (PROTONIX) EC tablet 40  mg (has no administration in time range)  tamsulosin (FLOMAX) capsule 0.4 mg (0.4 mg Oral Given 09/15/20 2356)  gabapentin (NEURONTIN) capsule 300 mg (300 mg Oral Given 09/15/20 2355)  acetaminophen (TYLENOL) tablet 650 mg (has no administration in time range)    Or  acetaminophen (TYLENOL) suppository 650 mg (has no administration in time range)  oxyCODONE (Oxy IR/ROXICODONE) immediate release tablet 5 mg (has no administration in time range)  oxyCODONE (Oxy IR/ROXICODONE) immediate release tablet 10 mg (has no administration in time range)  HYDROmorphone (DILAUDID) injection 1 mg (has no administration in time range)  cyclobenzaprine (FLEXERIL) tablet 10 mg (has no administration in time range)  docusate sodium (COLACE) capsule 100 mg (100 mg Oral Not Given 09/15/20 2356)  polyethylene glycol (MIRALAX / GLYCOLAX) packet 17 g (has no administration in time range)  ondansetron (ZOFRAN) tablet 4 mg (has no administration in time range)    Or  ondansetron (ZOFRAN) injection 4 mg (has no administration in time range)  menthol-cetylpyridinium (CEPACOL) lozenge 3 mg (has no administration in time range)    Or  phenol (CHLORASEPTIC) mouth spray 1 spray (  has no administration in time range)  0.9 %  sodium chloride infusion ( Intravenous New Bag/Given 09/16/20 0000)  insulin aspart (novoLOG) injection 0-15 Units (has no administration in time range)  insulin aspart (novoLOG) injection 0-5 Units (0 Units Subcutaneous Not Given 09/15/20 2347)  iohexol (OMNIPAQUE) 300 MG/ML solution 100 mL (100 mLs Intravenous Contrast Given 09/15/20 2116)    ED Course  I have reviewed the triage vital signs and the nursing notes.  Pertinent labs & imaging results that were available during my care of the patient were reviewed by me and considered in my medical decision making (see chart for details).    MDM Rules/Calculators/A&P                          [name] [name]. is a 74 y.o. male with a past medical  history significant for hypertension, hyperlipidemia, CAD status post PCI, and prior cervical myelopathy status post cervical spine surgery December 2020 who presents at the direction of his neurology and neurosurgery team for evaluation of worsening left-sided symptoms leading to inability to ambulate with falls.  Patient reports that he was seen several days ago by his neurologist and they are concerned about worsening left arm and left leg weakness leading to falls.  Patient also continues to have worsening left facial numbness and left-sided numbness.  He had work-up several months ago when they were concerned for possible stroke but stroke work-up did not show acute stroke.  Patient says that over the last week he has had worsening symptoms and went from using a cane to a walker to now he says he cannot ambulate without falling.  His last fall was yesterday as he has not tried to walk around much today.  He reports he did not hit his head or neck and denies any pain in his head or neck currently.  He denies loss of bowel or bladder control.  He denies any new fevers, chills, chest pain, shortness of breath, nausea, vomiting.  He denies any vision changes.  Denies any persistent dizziness.  He reports his leaning to the left and fall seem to be related to his weakness on the left side.  He does take using his lightheaded but this is not persistent.  Patient denies other complaints today.  He reports his neurosurgeon told him to come in.  On exam, lungs are clear and chest is nontender.  Abdomen is nontender.  Patient had weakness in the left leg and left arm compared to right.  I did not see facial droop.  He did have decreased in station left arm and left leg and left face.  Pupils are symmetric reactive normal extract movements.  I do not appreciate nystagmus.  Clear speech.  Nontender neck.  No carotid bruit was appreciated.  He reports his symptoms did not change with neck movement.  No neck tenderness.   No back tenderness.  We will touch base with the patient's neurosurgeon who is on call here today.  We will discuss further imaging.  As is been several months since imaging, anticipate they may want MRI to further evaluate his head and neck.  As the patient reports he is unable to walk and has had approximately 4 falls in the last week or so, I do worry about the patient having further falls at this time.  Anticipate disposition based on neurosurgery recommendations and work-up results.  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  He ordered this image.  Anticipate neurosurgery will admit for further management.   Final Clinical Impression(s) / ED Diagnoses Final diagnoses:  Weakness     Clinical Impression: 1. Weakness     Disposition: Admit  This note was prepared with assistance of Dragon voice recognition software. Occasional wrong-word or sound-a-like substitutions may have occurred due to the inherent limitations of voice recognition software.      Aaryana Betke, Gwenyth Allegra, MD 09/16/20 0002

## 2020-09-15 NOTE — ED Notes (Signed)
Patient transported to CT 

## 2020-09-15 NOTE — ED Notes (Signed)
Patient transported to MRI 

## 2020-09-16 DIAGNOSIS — R531 Weakness: Secondary | ICD-10-CM

## 2020-09-16 LAB — RENAL FUNCTION PANEL
Albumin: 4 g/dL (ref 3.5–5.0)
Anion gap: 14 (ref 5–15)
BUN: 16 mg/dL (ref 8–23)
CO2: 19 mmol/L — ABNORMAL LOW (ref 22–32)
Calcium: 9.4 mg/dL (ref 8.9–10.3)
Chloride: 101 mmol/L (ref 98–111)
Creatinine, Ser: 1.04 mg/dL (ref 0.61–1.24)
GFR, Estimated: >60 mL/min (ref >60)
Glucose, Bld: 214 mg/dL — ABNORMAL HIGH (ref 70–99)
Phosphorus: 4.2 mg/dL (ref 2.5–4.6)
Potassium: 4.3 mmol/L (ref 3.5–5.1)
Sodium: 134 mmol/L — ABNORMAL LOW (ref 135–145)

## 2020-09-16 LAB — GLUCOSE, CAPILLARY
Glucose-Capillary: 125 mg/dL — ABNORMAL HIGH (ref 70–99)
Glucose-Capillary: 225 mg/dL — ABNORMAL HIGH (ref 70–99)
Glucose-Capillary: 154 mg/dL — ABNORMAL HIGH (ref 70–99)
Glucose-Capillary: 201 mg/dL — ABNORMAL HIGH (ref 70–99)

## 2020-09-16 LAB — LIPID PANEL
Cholesterol: 126 mg/dL (ref 0–200)
HDL: 32 mg/dL — ABNORMAL LOW (ref >40)
LDL Cholesterol: 38 mg/dL (ref 0–99)
Total CHOL/HDL Ratio: 3.9 RATIO
Triglycerides: 281 mg/dL — ABNORMAL HIGH (ref <150)
VLDL: 56 mg/dL — ABNORMAL HIGH (ref 0–40)

## 2020-09-16 MED ORDER — ATORVASTATIN CALCIUM 80 MG PO TABS
80.0000 mg | ORAL_TABLET | Freq: Every day | ORAL | Status: DC
  Administered 2020-09-16: 80 mg via ORAL
  Filled 2020-09-16: qty 1

## 2020-09-16 NOTE — Progress Notes (Signed)
At 0030: Patient came from ED and didn't have any pain or numbness/tinglings. Vital signs are stable. Patient reports that his left sides are weak. NPO after midnight.   At 0430: patient called and told me that he is having headache. Only left side of face and head are hurt. He reports that it feels like dull pain (10/10) and numbness. Pupils are equal. Face is symmetrical. Patient requested to put wet washcloth on his head. Wet washcloth applied. Oxycodone 10 mg administered with sips of water. Vital signs are stable. Will continue to assess.

## 2020-09-16 NOTE — Progress Notes (Addendum)
Neurosurgery Service Progress Note  Subjective: No acute events overnight, complaining today of worsening left sided facial pain, demarcated in the midline and at the hairline, describes it as burning, not lancinating, no triggers from cutaneous touch / etc, in all 3 branches of CN5   Objective: Vitals:   09/16/20 0040 09/16/20 0042 09/16/20 0446 09/16/20 0753  BP:  (!) 162/81 133/73 135/76  Pulse:  73 70 77  Resp:  18 18 18   Temp:  98.2 F (36.8 C) 98.3 F (36.8 C) 98 F (36.7 C)  TempSrc:  Oral Oral   SpO2:  99% 97% 100%  Weight: 99.2 kg     Height:       Temp (24hrs), Avg:98.1 F (36.7 C), Min:97.7 F (36.5 C), Max:98.3 F (36.8 C)  CBC Latest Ref Rng & Units 09/15/2020 07/15/2020 07/15/2020  WBC 4.0 - 10.5 K/uL 7.5 - 13.4(H)  Hemoglobin 13.0 - 17.0 g/dL 13.9 14.3 13.7  Hematocrit 39 - 52 % 43.4 42.0 43.2  Platelets 150 - 400 K/uL 259 - 255   BMP Latest Ref Rng & Units 09/15/2020 07/15/2020 07/15/2020  Glucose 70 - 99 mg/dL 127(H) 228(H) 230(H)  BUN 8 - 23 mg/dL 12 30(H) 29(H)  Creatinine 0.61 - 1.24 mg/dL 0.77 0.90 1.09  Sodium 135 - 145 mmol/L 136 134(L) 132(L)  Potassium 3.5 - 5.1 mmol/L 4.3 4.8 4.8  Chloride 98 - 111 mmol/L 101 101 101  CO2 22 - 32 mmol/L 25 - 19(L)  Calcium 8.9 - 10.3 mg/dL 9.8 - 8.6(L)    Intake/Output Summary (Last 24 hours) at 09/16/2020 0907 Last data filed at 09/16/2020 0500 Gross per 24 hour  Intake 365.91 ml  Output 800 ml  Net -434.09 ml    Current Facility-Administered Medications:  .  0.9 %  sodium chloride infusion, , Intravenous, Continuous, Thelma Viana, Joyice Faster, MD, Last Rate: 75 mL/hr at 09/16/20 0000, New Bag at 09/16/20 0000 .  acetaminophen (TYLENOL) tablet 650 mg, 650 mg, Oral, Q4H PRN **OR** acetaminophen (TYLENOL) suppository 650 mg, 650 mg, Rectal, Q4H PRN, Judith Part, MD .  atorvastatin (LIPITOR) tablet 20 mg, 20 mg, Oral, Daily, Aleiah Mohammed A, MD .  cyclobenzaprine (FLEXERIL) tablet 10 mg, 10 mg, Oral, TID PRN,  Judith Part, MD .  docusate sodium (COLACE) capsule 100 mg, 100 mg, Oral, BID, Paidyn Mcferran A, MD .  gabapentin (NEURONTIN) capsule 300 mg, 300 mg, Oral, TID, Judith Part, MD, 300 mg at 09/15/20 2355 .  HYDROmorphone (DILAUDID) injection 1 mg, 1 mg, Intravenous, Q3H PRN, Willer Osorno A, MD .  insulin aspart (novoLOG) injection 0-15 Units, 0-15 Units, Subcutaneous, TID WC, Davison Ohms A, MD .  insulin aspart (novoLOG) injection 0-5 Units, 0-5 Units, Subcutaneous, QHS, Dottie Vaquerano A, MD .  menthol-cetylpyridinium (CEPACOL) lozenge 3 mg, 1 lozenge, Oral, PRN **OR** phenol (CHLORASEPTIC) mouth spray 1 spray, 1 spray, Mouth/Throat, PRN, Jillienne Egner, Joyice Faster, MD .  metoprolol succinate (TOPROL-XL) 24 hr tablet 50 mg, 50 mg, Oral, Daily, Magdala Brahmbhatt A, MD .  ondansetron (ZOFRAN) tablet 4 mg, 4 mg, Oral, Q6H PRN **OR** ondansetron (ZOFRAN) injection 4 mg, 4 mg, Intravenous, Q6H PRN, Jeremie Abdelaziz A, MD .  oxyCODONE (Oxy IR/ROXICODONE) immediate release tablet 10 mg, 10 mg, Oral, Q4H PRN, Judith Part, MD, 10 mg at 09/16/20 0442 .  oxyCODONE (Oxy IR/ROXICODONE) immediate release tablet 5 mg, 5 mg, Oral, Q4H PRN, Birt Reinoso A, MD .  pantoprazole (PROTONIX) EC tablet 40 mg, 40 mg, Oral, Daily,  Judith Part, MD .  polyethylene glycol (MIRALAX / GLYCOLAX) packet 17 g, 17 g, Oral, Daily PRN, Judith Part, MD .  tamsulosin (FLOMAX) capsule 0.4 mg, 0.4 mg, Oral, QHS, Amarie Viles, Joyice Faster, MD, 0.4 mg at 09/15/20 2356   Physical Exam: Strength 4/5 on L, 5/5 on R, no hoffman's, no clonus  Assessment & Plan: 74 y.o. man s/p ACDF 80mo ago for myelopathy with good improvement of symptoms post-op, then progression of L facial pain / numbness > LUE/LLE numbness and falls to the left. Preop, he was having some pre-syncopal / syncopal Sx upon standing, curious if he could be having an atypical presentation of worsening VBI.  -MRI C-spine reviewed,  prior MRI C-spine not on PACS for comparison, but from review of records my read in 2020 included some cord signal change at C5-6. New MRI shows moderate stenosis at multiple levels, no longer has any areas of severe stenosis, cord signal change at C5-6, L C5-6 osteophyte causing severe foraminal stenosis at that level. -CTA shows severe bilateral vert origin stenosis as well as distal atherosclerotic disease, no clear occlusion when head turning -will c/s neurology, curious if this facial pain is an atypical presentation of worsening VBI. If there is no other clear cause of his worsening, given how quickly he's getting worse with no intracranial findings, we could consider posterior decompression of his cord, but I'm skeptical that this will help given his facial symptoms and lack of myelopathic findings on exam -resume diet, NPO p MN -will start [facility] tomorrow if not going to the OR -RFP given that he got a contrast load yesterday evening  Judith Part  09/16/20 9:07 AM

## 2020-09-16 NOTE — Consult Note (Addendum)
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  Referring Physician: Zada Finders  CC: left sided weakness and facial numbness   History is obtained from:patient in room  HPI: [name] [name]. is a 74 y.o. 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  He reports that he was doing well post ACDF until August, 2021 when began to have episodes of near loss of consciousness and left sided weakness. He was taken to outside hospital where there was a concern for possible stroke but stroke work up did not show acute stroke. Over the past few weeks, he has had more severe episodes of left hemifacial numbness, as well as left hemibody weakness.   Specifically, the patient describes a very stepwise progression of his left-sided weakness.  He reports that on August 25 he had a sudden change in his voice and left-sided weakness that was then stable for a week, followed by another very acute episode that happened when he was just standing and waiting for someone to pick him up.  Regarding his initial spine surgery, he reports that his symptoms were right arm numbness (corroborated on chart review), which improved after spine surgery.  To me he denies that the symptoms are positional, just that he tends to fall to the left side (whereas to Dr. Venetia Constable he may have reported symptoms on turning his head to the left).  He additionally complains of burning pain in his left face that is a sensation of heat.  He is somewhat  challenging in describing the history of this initially saying it never goes away and then saying sometimes it does get to 0 out of 10, it does range all the way up to a 10 out of 10, but typically is at a 6 out of 10.  At times will wake him from sleep.  There are no clear exacerbating factors.  He was seen by his neurologist who referred him for immediate evaluation by his neurosurgeon. He was sent to this hospital for evaluation. MRI brain showed no acute intracranial abnoramlity. MRI cervical spineshows multilevel spinal and foraminal stenosis due to spurring as well as large osteophyte on the left at C5-6 with severe left foraminal encroachment and bilateral cord hyperintensity at C5-6 ; nurse reports there is planned surgical procedure. CTA head shows no acute intracranial abnormality but did reveal severe stenosis versus occlusion at proximal RVA with distal reconstitution, as well as severe focal stenosis at origin of LVA, and approximate 50% stenosis at bilateral carotid arteries.   General Neurology team was asked to evaluate this patient, and we thank the primary team for the kind referral  LKW: approximately 3 weeks ago.  tPA given?: No     ROS: A 14 point ROS was performed and is negative except as noted in the    Past Medical History:  Diagnosis Date  . CAD (coronary artery disease)   . Cancer ([place])   . Diabetes ([place])   . Dyslipidemia   . Gout   . History of kidney stones   . HTN (hypertension)   . Myocardial infarction ([place])  post ACDF C3-C7 December 2020  Family History  Problem Relation Age of Onset  . Heart attack Father 68  . Colon cancer Brother 28     Social History:  reports that he quit smoking about 31 years ago. His smoking use included cigarettes. He started smoking about 67 years ago. He has a 180.00 pack-year smoking history. His smokeless tobacco use includes snuff. He reports that he does not drink alcohol and does not use drugs.  he is married. Former  Administrator x 56 years.   Exam: Current vital signs: BP 135/76 (BP Location: Left Arm)   Pulse 77   Temp 98 F (36.7 C)   Resp 18   Ht 5\' 9"  (1.753 m)   Wt 99.2 kg   SpO2 100%   BMI 32.30 kg/m  Vital signs in last 24 hours: Temp:  [97.7 F (36.5 C)-98.3 F (36.8 C)] 98 F (36.7 C) (11/06 0753) Pulse Rate:  [66-85] 77 (11/06 0753) Resp:  [13-21] 18 (11/06 0753) BP: (133-168)/(68-82) 135/76 (11/06 0753) SpO2:  [96 %-100 %] 100 % (11/06 0753) Weight:  [99.2 kg-100.7 kg] 99.2 kg (11/06 0040)   Physical Exam  Constitutional: Appears well-developed and well-nourished, central obesity Psych: Affect appropriate to situation, slightly anxious at times but quite pleasant Eyes: No scleral injection HENT: No OP obstruction, moon facies, right eyelid surgery MSK: no joint deformities.  Cardiovascular: Normal rate and regular rhythm.  Respiratory: Effort normal, non-labored breathing GI: Soft.  No distension. There is no tenderness.  Skin: WDI  Neuro: Mental Status: Patient is awake, alert, oriented to person, place, month, year, and situation.  Patient is able to give a clear and coherent history.  No signs of aphasia or neglect  Cranial Nerves: II: Visual Fields are full. Pupils are equal, round, and reactive to light.    III,IV, VI: EOMI without ptosis or diploplia.  Saccades are mildly choppy. V: Facial sensation is symmetric to temperature VII: Facial movement is symmetric.  VIII: hearing is intact to voice, and symmetric to tuning fork placed on his forehead (initially reported right-sided hearing loss, but this was not reproducible) X: Uvula elevates symmetrically XI: Shoulder shrug is symmetric. XII: tongue is midline without atrophy or fasciculations.  Motor: Tone is normal, slightly increased on the right side compared to the left upper extremity. Bulk is normal. 5/5 strength was present at right side, but 4/5 at left side.  There was significant giveaway weakness on  testing of the left-sided strength.  Additionally he is unable to fully supinate the left side due to prior shoulder surgery so it is unclear whether his pronator drift is a functional pronator drift versus a true drift. Sensory: There is no sensory level torso, front or back.  On his limbs there is a length dependent loss of temperature sensation.  On his right hand there is loss of vibration and proprioception.  On his left hand vibration and proprioception are intact.  Vibration is absent at his toes and greatly diminished (2 seconds) at his knees bilaterally.  He does not split the midline to cold sensation in the V1 distribution his face, reporting he changed warmth slightly above his left eyebrow Deep Tendon Reflexes: 1+ in the biceps and brachioradialis on the left, 2+ on the right upper extremity.  No hoffman's no clonus.  Plantars: Toes are downgoing bilaterally.     No results found for: CHOL, HDL, LDLCALC, LDLDIRECT, TRIG, CHOLHDL  Lab Results  Component Value Date   HGBA1C  6.9 (H) 09/15/2020      I have reviewed the images obtained:  ACDF C3 through C7.  Multilevel spinal and foraminal stenosis due to spurring.  Large osteophyte on the left at C5-6 with severe left foraminal encroachment moderate spinal stenosis. Bilateral cord hyperintensity at C5-6.  MRI brain without acute intracranial process, unfortunately T2 cuts through the brainstem are too thick to allow good resolution for potential small strokes there, though there is minimal evidence of chronic ischemic changes  Impression: 74 year old male with history of cervical myelopathy now s/p ACDF C3-C7 10/2019, now with left sided facial numbness and left hemibody weakness. MRI cervical spine does show multiple foraminal stenosis due to spurring. CTA neck does reveal bilateral vertebral artery stenosis at the origins, though MRI brain shows minimal microvascular change typically associated with advanced atherosclerotic  disease. Regarding his facial pain, significant trigeminal distribution pain can be seen with cervical spine nerve root compression.  Specifically this is due to convergence of input of cervical and trigeminal afferent in the trigeminal cervical nucleus, allowing referral of cervical nerve root compression pain into the face and head regions.   Reference: https://practicalneurology.com/articles/2010-nov-dec/evaluation-of-cervicalgia-with-headache  Contributing factors: -Severe bilateral vertebral artery stenosis, unlikely to be contributing to his symptoms based on minimal MRI findings of chronic microvascular disease -Significant peripheral neuropathy, causing hyporeflexia despite his myelopathy -Cervical myelopathy -Functional overlay, complicating evaluation  Recommendations:  #Cervical spinal nerve root compression - Surgical decompression per neurosurgery  #Vertebral artery atherosclerosis - Check lipid panel  - Maximize statin therapy given stenoses. - Outpatient optimization of A1c goal < 6.5%  - Weight loss and diet counseling - no antiplatelet therapy given planned surgical decompression; start ASA 81 mg once safe from a neurosurgical perspective  Lesleigh Noe MD-PhD [facility] [phone]  Addendum 11/7 Cholesterol panel notable for LDL of 38, in the setting will reduce back to his home dose of Lipitor 20 mg Neurology will be available on an as-needed basis going forward, please page if additional questions or concerns arise

## 2020-09-17 ENCOUNTER — Inpatient Hospital Stay (HOSPITAL_COMMUNITY): Payer: Medicare HMO | Admitting: Anesthesiology

## 2020-09-17 ENCOUNTER — Encounter (HOSPITAL_COMMUNITY)
Admission: EM | Disposition: A | Discharge status: Rehabilitation Facility | Payer: Self-pay | Source: Home / Self Care | Attending: Neurological Surgery

## 2020-09-17 ENCOUNTER — Encounter (HOSPITAL_COMMUNITY): Payer: Self-pay | Admitting: Neurological Surgery

## 2020-09-17 ENCOUNTER — Inpatient Hospital Stay (HOSPITAL_COMMUNITY): Payer: Medicare HMO

## 2020-09-17 HISTORY — PX: POSTERIOR CERVICAL FUSION/FORAMINOTOMY: SHX5038

## 2020-09-17 LAB — GLUCOSE, CAPILLARY
Glucose-Capillary: 142 mg/dL — ABNORMAL HIGH (ref 70–99)
Glucose-Capillary: 173 mg/dL — ABNORMAL HIGH (ref 70–99)
Glucose-Capillary: 180 mg/dL — ABNORMAL HIGH (ref 70–99)
Glucose-Capillary: 204 mg/dL — ABNORMAL HIGH (ref 70–99)
Glucose-Capillary: 214 mg/dL — ABNORMAL HIGH (ref 70–99)

## 2020-09-17 SURGERY — POSTERIOR CERVICAL FUSION/FORAMINOTOMY LEVEL 3
Anesthesia: General | Site: Spine Cervical

## 2020-09-17 MED ORDER — ACETAMINOPHEN 10 MG/ML IV SOLN
INTRAVENOUS | Status: AC
  Filled 2020-09-17: qty 100

## 2020-09-17 MED ORDER — LABETALOL HCL 5 MG/ML IV SOLN
INTRAVENOUS | Status: AC
  Filled 2020-09-17: qty 4

## 2020-09-17 MED ORDER — MEPERIDINE HCL 25 MG/ML IJ SOLN
6.2500 mg | INTRAMUSCULAR | Status: DC | PRN
  Administered 2020-09-17 (×2): 12.5 mg via INTRAVENOUS

## 2020-09-17 MED ORDER — SUGAMMADEX SODIUM 200 MG/2ML IV SOLN
INTRAVENOUS | Status: DC | PRN
  Administered 2020-09-17: 200 mg via INTRAVENOUS

## 2020-09-17 MED ORDER — DEXAMETHASONE SODIUM PHOSPHATE 10 MG/ML IJ SOLN
INTRAMUSCULAR | Status: DC | PRN
  Administered 2020-09-17: 4 mg via INTRAVENOUS

## 2020-09-17 MED ORDER — ESMOLOL HCL 100 MG/10ML IV SOLN
INTRAVENOUS | Status: DC | PRN
  Administered 2020-09-17: 20 mg via INTRAVENOUS
  Administered 2020-09-17: 40 mg via INTRAVENOUS

## 2020-09-17 MED ORDER — ACETAMINOPHEN 10 MG/ML IV SOLN
INTRAVENOUS | Status: DC | PRN
  Administered 2020-09-17: 1000 mg via INTRAVENOUS

## 2020-09-17 MED ORDER — 0.9 % SODIUM CHLORIDE (POUR BTL) OPTIME
TOPICAL | Status: DC | PRN
  Administered 2020-09-17: 1000 mL

## 2020-09-17 MED ORDER — ROCURONIUM BROMIDE 10 MG/ML (PF) SYRINGE
PREFILLED_SYRINGE | INTRAVENOUS | Status: AC
  Filled 2020-09-17: qty 10

## 2020-09-17 MED ORDER — LACTATED RINGERS IV SOLN: INTRAVENOUS | Status: DC

## 2020-09-17 MED ORDER — LIDOCAINE-EPINEPHRINE 1 %-1:100000 IJ SOLN
INTRAMUSCULAR | Status: AC
  Filled 2020-09-17: qty 1

## 2020-09-17 MED ORDER — BACITRACIN ZINC 500 UNIT/GM EX OINT
TOPICAL_OINTMENT | CUTANEOUS | Status: AC
  Filled 2020-09-17: qty 28.35

## 2020-09-17 MED ORDER — HYDROMORPHONE HCL 1 MG/ML IJ SOLN: 0.2500 mg | INTRAMUSCULAR | Status: DC | PRN

## 2020-09-17 MED ORDER — FENTANYL CITRATE (PF) 250 MCG/5ML IJ SOLN
INTRAMUSCULAR | Status: DC | PRN
  Administered 2020-09-17: 50 ug via INTRAVENOUS
  Administered 2020-09-17: 200 ug via INTRAVENOUS

## 2020-09-17 MED ORDER — LACTATED RINGERS IV SOLN: INTRAVENOUS | Status: DC | PRN

## 2020-09-17 MED ORDER — PROPOFOL 10 MG/ML IV BOLUS
INTRAVENOUS | Status: DC | PRN
  Administered 2020-09-17: 50 mg via INTRAVENOUS
  Administered 2020-09-17: 150 mg via INTRAVENOUS

## 2020-09-17 MED ORDER — THROMBIN 5000 UNITS EX SOLR
CUTANEOUS | Status: AC
  Filled 2020-09-17: qty 5000

## 2020-09-17 MED ORDER — PHENYLEPHRINE 40 MCG/ML (10ML) SYRINGE FOR IV PUSH (FOR BLOOD PRESSURE SUPPORT)
PREFILLED_SYRINGE | INTRAVENOUS | Status: AC
  Filled 2020-09-17: qty 10

## 2020-09-17 MED ORDER — MEPERIDINE HCL 25 MG/ML IJ SOLN
INTRAMUSCULAR | Status: AC
  Filled 2020-09-17: qty 1

## 2020-09-17 MED ORDER — ATORVASTATIN CALCIUM 10 MG PO TABS
20.0000 mg | ORAL_TABLET | Freq: Every day | ORAL | Status: DC
  Administered 2020-09-17 – 2020-09-21 (×5): 20 mg via ORAL
  Filled 2020-09-17 (×6): qty 2

## 2020-09-17 MED ORDER — PROMETHAZINE HCL 25 MG/ML IJ SOLN: 6.2500 mg | INTRAMUSCULAR | Status: DC | PRN

## 2020-09-17 MED ORDER — ORAL CARE MOUTH RINSE: 15.0000 mL | Freq: Once | OROMUCOSAL | Status: AC

## 2020-09-17 MED ORDER — MIDAZOLAM HCL 2 MG/2ML IJ SOLN: 0.5000 mg | Freq: Once | INTRAMUSCULAR | Status: DC | PRN

## 2020-09-17 MED ORDER — FENTANYL CITRATE (PF) 250 MCG/5ML IJ SOLN
INTRAMUSCULAR | Status: AC
  Filled 2020-09-17: qty 5

## 2020-09-17 MED ORDER — SUCCINYLCHOLINE CHLORIDE 200 MG/10ML IV SOSY
PREFILLED_SYRINGE | INTRAVENOUS | Status: DC | PRN
  Administered 2020-09-17: 140 mg via INTRAVENOUS

## 2020-09-17 MED ORDER — PHENYLEPHRINE 40 MCG/ML (10ML) SYRINGE FOR IV PUSH (FOR BLOOD PRESSURE SUPPORT)
PREFILLED_SYRINGE | INTRAVENOUS | Status: DC | PRN
  Administered 2020-09-17 (×5): 80 ug via INTRAVENOUS

## 2020-09-17 MED ORDER — DEXAMETHASONE SODIUM PHOSPHATE 10 MG/ML IJ SOLN
INTRAMUSCULAR | Status: AC
  Filled 2020-09-17: qty 1

## 2020-09-17 MED ORDER — LABETALOL HCL 5 MG/ML IV SOLN
INTRAVENOUS | Status: DC | PRN
  Administered 2020-09-17: 10 mg via INTRAVENOUS

## 2020-09-17 MED ORDER — LIDOCAINE 2% (20 MG/ML) 5 ML SYRINGE
INTRAMUSCULAR | Status: DC | PRN
  Administered 2020-09-17: 20 mg via INTRAVENOUS

## 2020-09-17 MED ORDER — PHENYLEPHRINE HCL-NACL 10-0.9 MG/250ML-% IV SOLN
INTRAVENOUS | Status: DC | PRN
  Administered 2020-09-17: 50 ug/min via INTRAVENOUS
  Administered 2020-09-17: 120 ug/min via INTRAVENOUS

## 2020-09-17 MED ORDER — EPHEDRINE 5 MG/ML INJ
INTRAVENOUS | Status: AC
  Filled 2020-09-17: qty 10

## 2020-09-17 MED ORDER — PROPOFOL 10 MG/ML IV BOLUS
INTRAVENOUS | Status: AC
  Filled 2020-09-17: qty 20

## 2020-09-17 MED ORDER — ACETAMINOPHEN 500 MG PO TABS: 1000.0000 mg | ORAL_TABLET | Freq: Once | ORAL | Status: DC

## 2020-09-17 MED ORDER — CEFAZOLIN SODIUM-DEXTROSE 2-3 GM-%(50ML) IV SOLR
INTRAVENOUS | Status: DC | PRN
  Administered 2020-09-17: 2 g via INTRAVENOUS

## 2020-09-17 MED ORDER — ROCURONIUM BROMIDE 10 MG/ML (PF) SYRINGE
PREFILLED_SYRINGE | INTRAVENOUS | Status: DC | PRN
  Administered 2020-09-17: 50 mg via INTRAVENOUS
  Administered 2020-09-17: 20 mg via INTRAVENOUS

## 2020-09-17 MED ORDER — BACITRACIN ZINC 500 UNIT/GM EX OINT
TOPICAL_OINTMENT | CUTANEOUS | Status: DC | PRN
  Administered 2020-09-17 (×2): 1 via TOPICAL

## 2020-09-17 MED ORDER — SUCCINYLCHOLINE CHLORIDE 200 MG/10ML IV SOSY
PREFILLED_SYRINGE | INTRAVENOUS | Status: AC
  Filled 2020-09-17: qty 10

## 2020-09-17 MED ORDER — LIDOCAINE-EPINEPHRINE 1 %-1:100000 IJ SOLN
INTRAMUSCULAR | Status: DC | PRN
  Administered 2020-09-17: 10 mL via INTRADERMAL

## 2020-09-17 MED ORDER — EPHEDRINE SULFATE-NACL 50-0.9 MG/10ML-% IV SOSY
PREFILLED_SYRINGE | INTRAVENOUS | Status: DC | PRN
  Administered 2020-09-17 (×2): 10 mg via INTRAVENOUS

## 2020-09-17 MED ORDER — CEFAZOLIN SODIUM-DEXTROSE 2-4 GM/100ML-% IV SOLN
INTRAVENOUS | Status: AC
  Filled 2020-09-17: qty 100

## 2020-09-17 MED ORDER — CHLORHEXIDINE GLUCONATE 0.12 % MT SOLN
15.0000 mL | Freq: Once | OROMUCOSAL | Status: AC
  Administered 2020-09-17: 15 mL via OROMUCOSAL

## 2020-09-17 MED ORDER — LIDOCAINE 2% (20 MG/ML) 5 ML SYRINGE
INTRAMUSCULAR | Status: AC
  Filled 2020-09-17: qty 5

## 2020-09-17 MED ORDER — OXYCODONE HCL 5 MG PO TABS: 5.0000 mg | ORAL_TABLET | Freq: Once | ORAL | Status: DC | PRN

## 2020-09-17 MED ORDER — THROMBIN 5000 UNITS EX SOLR
OROMUCOSAL | Status: DC | PRN
  Administered 2020-09-17: 5 mL via TOPICAL

## 2020-09-17 MED ORDER — ONDANSETRON HCL 4 MG/2ML IJ SOLN
INTRAMUSCULAR | Status: DC | PRN
  Administered 2020-09-17: 4 mg via INTRAVENOUS

## 2020-09-17 MED ORDER — CHLORHEXIDINE GLUCONATE 0.12 % MT SOLN
OROMUCOSAL | Status: AC
  Filled 2020-09-17: qty 15

## 2020-09-17 MED ORDER — OXYCODONE HCL 5 MG/5ML PO SOLN: 5.0000 mg | Freq: Once | ORAL | Status: DC | PRN

## 2020-09-17 MED ORDER — ONDANSETRON HCL 4 MG/2ML IJ SOLN
INTRAMUSCULAR | Status: AC
  Filled 2020-09-17: qty 2

## 2020-09-17 SURGICAL SUPPLY — 55 items
ADH SKN CLS APL DERMABOND .7 (GAUZE/BANDAGES/DRESSINGS) ×1
APL SKNCLS STERI-STRIP NONHPOA (GAUZE/BANDAGES/DRESSINGS)
BENZOIN TINCTURE PRP APPL 2/3 (GAUZE/BANDAGES/DRESSINGS) IMPLANT
BLADE CLIPPER SURG (BLADE) ×3 IMPLANT
BUR MATCHSTICK NEURO 3.0 LAGG (BURR) IMPLANT
BUR PRECISION FLUTE 5.0 (BURR) ×3 IMPLANT
CANISTER SUCT 3000ML PPV (MISCELLANEOUS) ×3 IMPLANT
COVER WAND RF STERILE (DRAPES) ×3 IMPLANT
DECANTER SPIKE VIAL GLASS SM (MISCELLANEOUS) ×3 IMPLANT
DERMABOND ADVANCED (GAUZE/BANDAGES/DRESSINGS) ×2
DERMABOND ADVANCED .7 DNX12 (GAUZE/BANDAGES/DRESSINGS) ×1 IMPLANT
DRAPE C-ARM 42X72 X-RAY (DRAPES) ×2 IMPLANT
DRAPE LAPAROTOMY 100X72 PEDS (DRAPES) ×3 IMPLANT
DURAPREP 6ML APPLICATOR 50/CS (WOUND CARE) ×3 IMPLANT
ELECT REM PT RETURN 9FT ADLT (ELECTROSURGICAL) ×3
ELECTRODE REM PT RTRN 9FT ADLT (ELECTROSURGICAL) ×1 IMPLANT
GAUZE 4X4 16PLY RFD (DISPOSABLE) IMPLANT
GAUZE SPONGE 4X4 12PLY STRL (GAUZE/BANDAGES/DRESSINGS) IMPLANT
GLOVE BIO SURGEON STRL SZ 6.5 (GLOVE) ×2 IMPLANT
GLOVE BIO SURGEON STRL SZ7 (GLOVE) ×2 IMPLANT
GLOVE BIO SURGEON STRL SZ7.5 (GLOVE) ×3 IMPLANT
GLOVE BIO SURGEONS STRL SZ 6.5 (GLOVE) ×1
GLOVE BIOGEL PI IND STRL 6.5 (GLOVE) ×1 IMPLANT
GLOVE BIOGEL PI IND STRL 7.5 (GLOVE) ×1 IMPLANT
GLOVE BIOGEL PI INDICATOR 6.5 (GLOVE) ×2
GLOVE BIOGEL PI INDICATOR 7.5 (GLOVE) ×4
GLOVE EXAM NITRILE LRG STRL (GLOVE) IMPLANT
GLOVE EXAM NITRILE XL STR (GLOVE) IMPLANT
GLOVE EXAM NITRILE XS STR PU (GLOVE) IMPLANT
GOWN STRL REUS W/ TWL LRG LVL3 (GOWN DISPOSABLE) IMPLANT
GOWN STRL REUS W/ TWL XL LVL3 (GOWN DISPOSABLE) ×1 IMPLANT
GOWN STRL REUS W/TWL 2XL LVL3 (GOWN DISPOSABLE) IMPLANT
GOWN STRL REUS W/TWL LRG LVL3 (GOWN DISPOSABLE)
GOWN STRL REUS W/TWL XL LVL3 (GOWN DISPOSABLE) ×3
HEMOSTAT POWDER KIT SURGIFOAM (HEMOSTASIS) ×3 IMPLANT
KIT BASIN OR (CUSTOM PROCEDURE TRAY) ×3 IMPLANT
KIT TURNOVER KIT B (KITS) ×3 IMPLANT
NEEDLE HYPO 22GX1.5 SAFETY (NEEDLE) ×3 IMPLANT
NS IRRIG 1000ML POUR BTL (IV SOLUTION) ×3 IMPLANT
PACK LAMINECTOMY NEURO (CUSTOM PROCEDURE TRAY) ×3 IMPLANT
PAD ARMBOARD 7.5X6 YLW CONV (MISCELLANEOUS) ×9 IMPLANT
PIN MAYFIELD SKULL DISP (PIN) ×3 IMPLANT
SPONGE LAP 4X18 RFD (DISPOSABLE) IMPLANT
STAPLER VISISTAT 35W (STAPLE) ×5 IMPLANT
SUT ETHILON 3 0 FSL (SUTURE) IMPLANT
SUT MNCRL AB 3-0 PS2 18 (SUTURE) ×3 IMPLANT
SUT MON AB 3-0 SH 27 (SUTURE) ×3
SUT MON AB 3-0 SH27 (SUTURE) ×1 IMPLANT
SUT VIC AB 0 CT1 18XCR BRD8 (SUTURE) ×1 IMPLANT
SUT VIC AB 0 CT1 8-18 (SUTURE) ×3
SUT VIC AB 2-0 CP2 18 (SUTURE) ×3 IMPLANT
TOWEL GREEN STERILE (TOWEL DISPOSABLE) ×3 IMPLANT
TOWEL GREEN STERILE FF (TOWEL DISPOSABLE) ×3 IMPLANT
UNDERPAD 30X36 HEAVY ABSORB (UNDERPADS AND DIAPERS) ×3 IMPLANT
WATER STERILE IRR 1000ML POUR (IV SOLUTION) ×3 IMPLANT

## 2020-09-17 NOTE — Anesthesia Procedure Notes (Signed)
Arterial Line Insertion Start/End11/05/2020 7:50 AM, 09/17/2020 7:55 AM Performed by: CRNA  Patient location: Pre-op. Preanesthetic checklist: patient identified, IV checked, site marked, risks and benefits discussed, surgical consent, monitors and equipment checked, pre-op evaluation, timeout performed and anesthesia consent Lidocaine 1% used for infiltration Left, radial was placed Catheter size: 20 G Hand hygiene performed   Attempts: 2 Procedure performed without using ultrasound guided technique. Following insertion, dressing applied and Biopatch. Post procedure assessment: normal  Patient tolerated the procedure well with no immediate complications.

## 2020-09-17 NOTE — Progress Notes (Signed)
Neurosurgery Service Post-operative progress note  Assessment & Plan: 74 y.o. man s/p C3-6 lami / foraminotomies, seen in PACU, MAEx4.  -transfer back to 4NP -activity as tolerated, no brace needed -will restart ASA on POD5 if doing well -SCDs/TEDs, restart SQH POD2  Judith Part  09/17/20 10:24 AM

## 2020-09-17 NOTE — Progress Notes (Signed)
Neurosurgery Service Progress Note  Subjective: No acute events overnight, no new complaints  Objective: Vitals:   09/17/20 0042 09/17/20 0427 09/17/20 0431 09/17/20 0745  BP: (!) 144/74  (!) 142/74 (!) 157/77  Pulse: 65  62 70  Resp: 18  18 17   Temp:  (!) 97.5 F (36.4 C)    TempSrc:  Oral    SpO2: 98%  96% 97%  Weight:      Height:       Temp (24hrs), Avg:97.9 F (36.6 C), Min:97.5 F (36.4 C), Max:98.5 F (36.9 C)  CBC Latest Ref Rng & Units 09/15/2020 07/15/2020 07/15/2020  WBC 4.0 - 10.5 K/uL 7.5 - 13.4(H)  Hemoglobin 13.0 - 17.0 g/dL 13.9 14.3 13.7  Hematocrit 39 - 52 % 43.4 42.0 43.2  Platelets 150 - 400 K/uL 259 - 255   BMP Latest Ref Rng & Units 09/16/2020 09/15/2020 07/15/2020  Glucose 70 - 99 mg/dL 214(H) 127(H) 228(H)  BUN 8 - 23 mg/dL 16 12 30(H)  Creatinine 0.61 - 1.24 mg/dL 1.04 0.77 0.90  Sodium 135 - 145 mmol/L 134(L) 136 134(L)  Potassium 3.5 - 5.1 mmol/L 4.3 4.3 4.8  Chloride 98 - 111 mmol/L 101 101 101  CO2 22 - 32 mmol/L 19(L) 25 -  Calcium 8.9 - 10.3 mg/dL 9.4 9.8 -    Intake/Output Summary (Last 24 hours) at 09/17/2020 0755 Last data filed at 09/17/2020 0430 Gross per 24 hour  Intake 615.38 ml  Output 1975 ml  Net -1359.62 ml    Current Facility-Administered Medications:  .  0.9 %  sodium chloride infusion, , Intravenous, Continuous, Linnell Swords, Joyice Faster, MD, Last Rate: 75 mL/hr at 09/17/20 0001, New Bag at 09/17/20 0001 .  acetaminophen (OFIRMEV) 10 MG/ML IV, , , ,  .  [MAR Hold] acetaminophen (TYLENOL) tablet 650 mg, 650 mg, Oral, Q4H PRN, 650 mg at 09/16/20 1051 **OR** [MAR Hold] acetaminophen (TYLENOL) suppository 650 mg, 650 mg, Rectal, Q4H PRN, Judith Part, MD .  acetaminophen (TYLENOL) tablet 1,000 mg, 1,000 mg, Oral, Once, Annye Asa, MD .  atorvastatin (LIPITOR) tablet 20 mg, 20 mg, Oral, QHS, Bhagat, Srishti L, MD .  ceFAZolin (ANCEF) 2-4 GM/100ML-% IVPB, , , ,  .  chlorhexidine (PERIDEX) 0.12 % solution, , , ,  .  [MAR Hold]  cyclobenzaprine (FLEXERIL) tablet 10 mg, 10 mg, Oral, TID PRN, Judith Part, MD .  Doug Sou Hold] docusate sodium (COLACE) capsule 100 mg, 100 mg, Oral, BID, Judith Part, MD, 100 mg at 09/16/20 2158 .  [MAR Hold] gabapentin (NEURONTIN) capsule 300 mg, 300 mg, Oral, TID, Judith Part, MD, 300 mg at 09/16/20 2158 .  [MAR Hold] HYDROmorphone (DILAUDID) injection 1 mg, 1 mg, Intravenous, Q3H PRN, Judith Part, MD .  Doug Sou Hold] insulin aspart (novoLOG) injection 0-15 Units, 0-15 Units, Subcutaneous, TID WC, Leonilda Cozby, Joyice Faster, MD .  Doug Sou Hold] insulin aspart (novoLOG) injection 0-5 Units, 0-5 Units, Subcutaneous, QHS, Julies Carmickle A, MD .  lactated ringers infusion, , Intravenous, Continuous, Annye Asa, MD .  Doug Sou Hold] menthol-cetylpyridinium (CEPACOL) lozenge 3 mg, 1 lozenge, Oral, PRN **OR** [MAR Hold] phenol (CHLORASEPTIC) mouth spray 1 spray, 1 spray, Mouth/Throat, PRN, Judith Part, MD .  Doug Sou Hold] metoprolol succinate (TOPROL-XL) 24 hr tablet 50 mg, 50 mg, Oral, Daily, Sydne Krahl A, MD, 50 mg at 09/16/20 1051 .  [MAR Hold] ondansetron (ZOFRAN) tablet 4 mg, 4 mg, Oral, Q6H PRN **OR** [MAR Hold] ondansetron (ZOFRAN) injection 4 mg, 4 mg,  Intravenous, Q6H PRN, Judith Part, MD .  Doug Sou Hold] oxyCODONE (Oxy IR/ROXICODONE) immediate release tablet 10 mg, 10 mg, Oral, Q4H PRN, Judith Part, MD, 10 mg at 09/16/20 0442 .  [MAR Hold] oxyCODONE (Oxy IR/ROXICODONE) immediate release tablet 5 mg, 5 mg, Oral, Q4H PRN, Judith Part, MD .  Doug Sou Hold] pantoprazole (PROTONIX) EC tablet 40 mg, 40 mg, Oral, Daily, Zyia Kaneko A, MD, 40 mg at 09/16/20 1051 .  [MAR Hold] polyethylene glycol (MIRALAX / GLYCOLAX) packet 17 g, 17 g, Oral, Daily PRN, Judith Part, MD .  Doug Sou Hold] tamsulosin (FLOMAX) capsule 0.4 mg, 0.4 mg, Oral, QHS, Sajad Glander, Joyice Faster, MD, 0.4 mg at 09/16/20 2158   Physical Exam: Strength 4/5 on left, 5/5 on  right  Assessment & Plan: 74 y.o. man w/ prior ACDF, now with progressive left sided weakness and facial pain/numbness, MRI C-spine with residual stenosis, MRI brain unremarkable, CTA with vert origin stenosis w/o evidence of Bowhunter's.  -OR today for C3-6 laminectomies  Judith Part  09/17/20 7:55 AM

## 2020-09-17 NOTE — Anesthesia Procedure Notes (Signed)
Procedure Name: Intubation Date/Time: 09/17/2020 8:16 AM Performed by: Renato Shin, CRNA Pre-anesthesia Checklist: Patient identified, Emergency Drugs available, Suction available and Patient being monitored Patient Re-evaluated:Patient Re-evaluated prior to induction Oxygen Delivery Method: Circle system utilized Preoxygenation: Pre-oxygenation with 100% oxygen Induction Type: IV induction Ventilation: Mask ventilation without difficulty Laryngoscope Size: Glidescope and 4 Grade View: Grade I Tube type: Oral Tube size: 7.5 mm Number of attempts: 1 Airway Equipment and Method: Oral airway,  Video-laryngoscopy and Rigid stylet Placement Confirmation: ETT inserted through vocal cords under direct vision,  positive ETCO2 and breath sounds checked- equal and bilateral Secured at: 21 cm Tube secured with: Tape Dental Injury: Teeth and Oropharynx as per pre-operative assessment  Difficulty Due To: Difficulty was anticipated and Difficult Airway-  due to neck instability Future Recommendations: Recommend- induction with short-acting agent, and alternative techniques readily available Comments: Elective glidescope due to cervical instability. CSpine neutral throughout induction and intubation.

## 2020-09-17 NOTE — Progress Notes (Addendum)
Pt was able to ambulate to bathroom to void bladder with fww and standby assist. This nurse noted that when pt was walking there was some bleeding from the surgical site. Drainage was a small amount bright red blood. Art line removed from pt l wrist. Pt tolerated well. Pressure dressing in place.     Pt reported to this nurse that he will not be taking any insuline while in the hospital.

## 2020-09-17 NOTE — Brief Op Note (Signed)
09/17/2020  10:23 AM  PATIENT:  [name] [name]  74 y.o. male  PRE-OPERATIVE DIAGNOSIS:  Cervical Stenosis  POST-OPERATIVE DIAGNOSIS:  Cervical Stenosis  PROCEDURE:  Procedure(s): CERVICAL THREE TO CERVICAL SIX POSTERIOR CERVICAL DECOMPRESSION (N/A)  SURGEON:  Surgeon(s) and Role:    * Judith Part, MD - Primary  PHYSICIAN ASSISTANT:   ASSISTANTS: none   ANESTHESIA:   general  EBL:  150 mL   BLOOD ADMINISTERED:none  DRAINS: none   LOCAL MEDICATIONS USED:  LIDOCAINE   SPECIMEN:  No Specimen  DISPOSITION OF SPECIMEN:  N/A  COUNTS:  YES  TOURNIQUET:  * No tourniquets in log *  DICTATION: .Note written in EPIC  PLAN OF CARE: Admit to inpatient   PATIENT DISPOSITION:  PACU - hemodynamically stable.   Delay start of Pharmacological VTE agent (>24hrs) due to surgical blood loss or risk of bleeding: yes

## 2020-09-17 NOTE — Anesthesia Postprocedure Evaluation (Signed)
Anesthesia Post Note  Patient: Mercer Peifer.  Procedure(s) Performed: CERVICAL THREE TO CERVICAL SIX POSTERIOR CERVICAL DECOMPRESSION (N/A Spine Cervical)     Patient location during evaluation: PACU Anesthesia Type: General Level of consciousness: awake and alert, patient cooperative and oriented Pain management: pain level controlled Vital Signs Assessment: post-procedure vital signs reviewed and stable Respiratory status: spontaneous breathing, nonlabored ventilation and respiratory function stable Cardiovascular status: blood pressure returned to baseline and stable Postop Assessment: no apparent nausea or vomiting Anesthetic complications: no   No complications documented.  Last Vitals:  Vitals:   09/17/20 1100 09/17/20 1130  BP: 113/73 106/63  Pulse: 82 80  Resp: 12 14  Temp:  36.7 C  SpO2: 97% 97%    Last Pain:  Vitals:   09/17/20 1130  TempSrc: Oral  PainSc: 4                  Salathiel Ferrara,E. Ferlando Lia

## 2020-09-17 NOTE — Transfer of Care (Signed)
Immediate Anesthesia Transfer of Care Note  Patient: [name] [name].  Procedure(s) Performed: CERVICAL THREE TO CERVICAL SIX POSTERIOR CERVICAL DECOMPRESSION (N/A Spine Cervical)  Patient Location: PACU  Anesthesia Type:General  Level of Consciousness: awake and patient cooperative  Airway & Oxygen Therapy: Patient Spontanous Breathing and Patient connected to face mask oxygen  Post-op Assessment: Report given to RN and Post -op Vital signs reviewed and stable  Post vital signs: Reviewed and stable  Last Vitals:  Vitals Value Taken Time  BP 139/71 09/17/20 1030  Temp    Pulse 83 09/17/20 1034  Resp 15 09/17/20 1034  SpO2 100 % 09/17/20 1034  Vitals shown include unvalidated device data.  Last Pain:  Vitals:   09/17/20 0745  TempSrc:   PainSc: 0-No pain      Patients Stated Pain Goal: 3 (15/66/48 3032)  Complications: No complications documented.

## 2020-09-17 NOTE — Op Note (Signed)
PATIENT: [name] [name] OF SURGERY: 09/17/20   PRE-OPERATIVE DIAGNOSIS:  Cervical spinal stenosis   POST-OPERATIVE DIAGNOSIS:  Cervical spinal stenosis   PROCEDURE:  C3-C6 cervical laminectomies and bilateral foraminotomies, exploration of fusion   SURGEON:  Surgeon(s) and Role:    Judith Part, MD - Primary   ANESTHESIA: ETGA   BRIEF HISTORY: This is a 74 year old man in whom I performed a prior multi-level ACDF for cervical myelopathy. He did well post-op with resolution of symptoms, then started having progressive left sided weakness with falls to the left as well as worsening of his syncopal / pre-syncopal symptoms along with some facial pain. Repeat MRI showed some residual moderate stenosis with multilevel severe foraminal disease. We discussed the situation at length and I explained the complexity of the situation. Given that he has progressed from ambulatory to walking with a cane to a wheelchair, with cord signal change on his MRI spine, I advised decompressing his residual stenosis to make sure that it is not causing this rapid functional progression. This was discussed with the patient as well as risks, benefits, and alternatives and wished to proceed with surgery. We also discussed the possibility of lack of improvement in the condition as well as unlikely improvement in his symptoms that do not localize to the cervical spine, as well as peri-operative risks due to his multiple medical co-morbidities including the vertebral stenosis. Additionally, given the CT appearance of his anterior fusion, I recommended a non-instrumented decompression unless there was intra-operative evidence of instability.    OPERATIVE DETAIL: The patient was taken to the operating room and anesthesia was induced by the anesthesia team. We discussed in preop the maintenance of MAPs due to his vert origin stenosis, so an A-line was placed in preop. The patient was placed in the [place] head  holder, then transferred to the OR table in the prone position with padding of all pressure points. A formal time out was performed with two patient identifiers and confirmed the operative site. The operative site was marked, hair was clipped with surgical clippers, the area was then prepped and draped in a sterile fashion. Fluoro was used to localize the operative level and a midline incision was placed to expose from C3 to C6. Subperiosteal dissection was performed bilaterally and fluoroscopy was again used to confirm the surgical level.   Decompression was performed, which consisted of laminectomies at C3, C4, C5, and C6. These were performed with a combination of high speed drill and kerrison rongeurs. The fusion surfaces from C3-C6 were then explored to confirm solid arthrodesis across these levels and they felt solid. Instrumentation was therefore not performed. Bilateral facetectomies were performed bilaterally at C3-4, C4-5, and C5-6. With the cord and nerve roots decompressed on palpation, the wound was copiously irrigated, and hemostasis was confirmed.  All instrument and sponge counts were correct, the incision was then closed in layers. The patient was then returned to anesthesia for emergence. No apparent complications at the completion of the procedure.   EBL:  152mL   DRAINS: none   SPECIMENS: none   Judith Part, MD 09/17/20 7:41 AM

## 2020-09-17 NOTE — Anesthesia Preprocedure Evaluation (Addendum)
Anesthesia Evaluation  Patient identified by MRN, date of birth, ID band Patient awake    Reviewed: Allergy & Precautions, NPO status , Patient's Chart, lab work & pertinent test results, reviewed documented beta blocker date and time   History of Anesthesia Complications Negative for: history of anesthetic complications  Airway Mallampati: II  TM Distance: >3 FB Neck ROM: Full    Dental  (+) Edentulous Upper, Edentulous Lower   Pulmonary former smoker,  09/15/2020 SARS coronavirus NEG   breath sounds clear to auscultation       Cardiovascular hypertension, Pt. on medications and Pt. on home beta blockers (-) angina+ CAD (remote angioplasty) and + Past MI   Rhythm:Regular Rate:Normal  11/2018 Stress: low risk study with a medium defect of moderate severity in the mid-inferoseptal and mid-inferior location which was felt to be 2/2 soft tissue attenuation; EF 54%. 06/2020 ECHO: EF 55-60%, grade 1 DD, no significant valvular disease   Neuro/Psych negative psych ROS   GI/Hepatic Neg liver ROS, GERD  Medicated and Controlled,  Endo/Other  diabetes, Oral Hypoglycemic AgentsMorbid obesity  Renal/GU negative Renal ROS     Musculoskeletal   Abdominal (+) + obese,   Peds  Hematology negative hematology ROS (+)   Anesthesia Other Findings   Reproductive/Obstetrics                           Anesthesia Physical Anesthesia Plan  ASA: III  Anesthesia Plan: General   Post-op Pain Management:    Induction: Intravenous  PONV Risk Score and Plan: 2 and Ondansetron and Dexamethasone  Airway Management Planned: Oral ETT and Video Laryngoscope Planned  Additional Equipment: None  Intra-op Plan:   Post-operative Plan: Extubation in OR  Informed Consent: I have reviewed the patients History and Physical, chart, labs and discussed the procedure including the risks, benefits and alternatives for the  proposed anesthesia with the patient or authorized representative who has indicated his/her understanding and acceptance.       Plan Discussed with: CRNA and Surgeon  Anesthesia Plan Comments:        Anesthesia Quick Evaluation

## 2020-09-17 NOTE — Progress Notes (Signed)
Pt came from pacu. They showed me where pt had staples placed on the right side of the head during the procedure. Pt A&O x 4. Able to move all extremities. Daughter at bedside. Call bell within reach. Pt in bed in lowest position,wheels are locked and bed alarm is on.

## 2020-09-18 ENCOUNTER — Encounter (HOSPITAL_COMMUNITY): Payer: Self-pay | Admitting: Neurological Surgery

## 2020-09-18 LAB — RENAL FUNCTION PANEL
Albumin: 3.5 g/dL (ref 3.5–5.0)
Anion gap: 9 (ref 5–15)
BUN: 14 mg/dL (ref 8–23)
CO2: 26 mmol/L (ref 22–32)
Calcium: 8.8 mg/dL — ABNORMAL LOW (ref 8.9–10.3)
Chloride: 100 mmol/L (ref 98–111)
Creatinine, Ser: 0.8 mg/dL (ref 0.61–1.24)
GFR, Estimated: >60 mL/min (ref >60)
Glucose, Bld: 201 mg/dL — ABNORMAL HIGH (ref 70–99)
Phosphorus: 3.1 mg/dL (ref 2.5–4.6)
Potassium: 3.8 mmol/L (ref 3.5–5.1)
Sodium: 135 mmol/L (ref 135–145)

## 2020-09-18 LAB — GLUCOSE, CAPILLARY
Glucose-Capillary: 192 mg/dL — ABNORMAL HIGH (ref 70–99)
Glucose-Capillary: 170 mg/dL — ABNORMAL HIGH (ref 70–99)
Glucose-Capillary: 216 mg/dL — ABNORMAL HIGH (ref 70–99)
Glucose-Capillary: 205 mg/dL — ABNORMAL HIGH (ref 70–99)

## 2020-09-18 NOTE — Evaluation (Signed)
Physical Therapy Evaluation Patient Details Name: [name] [name]. MRN: [id number] DOB: September 20, 1946 Today's Date: 09/18/2020   History of Present Illness  Pt is a 74 yo male admitted for C3-C6 decompressive cervical laminectomy.  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  Pt with past ACDF in 10/2019.  Pt had "event" on 07/05/20 that caused him to go weak in L side and has been weak ever since.  No noted CVA.  Unsure origin of this weakness.  Pt with old L rotator cuff repair and residual ROM issues from that surgery years ago.  Clinical Impression  Pt in bed upon arrival of PT, agreeable to evaluation at this time. Prior to admission the pt was independent with use of a cane for mobility, but within last 3 weeks has had increased frequency of falls requiring use of  A RW for stability. The pt now presents with limitations in functional mobility, strength, stability, endurance, and power due to above dx and resulting pain, and will continue to benefit from skilled PT to address these deficits. The pt was able to complete initial transfers and bed mobility with minA of 2 for sequencing and maintaining stability in seated position (pt with L side lean which he is able to correct when cued). The pt was agreeable to OOB transfer and lateral steps to [facility], but declined further ambulation at this time due to significant pain level. The pt requires significant assist to maintain upright posture as well as assist to move RW and maintain stability. The pt will continue to benefit from skilled PT to progress these deficits, and would benefit from the intensity of CIR level therapies to return to prior level of independence and safety with mobility.      Follow Up Recommendations CIR    Equipment Recommendations   (defer to post acute)    Recommendations for Other Services Rehab consult     Precautions / Restrictions Precautions Precautions: Fall;Cervical Precaution Comments: very  painful neck with all movement Restrictions Weight Bearing Restrictions: No      Mobility  Bed Mobility Overal bed mobility: Needs Assistance Bed Mobility: Supine to Sit;Sit to Supine     Supine to sit: Min assist;HOB elevated Sit to supine: Min assist   General bed mobility comments: Pt required cues to use bedrail and bring L hip around to sit with both feet on floor.  Movment in bed is very painful.    Transfers Overall transfer level: Needs assistance Equipment used: Rolling walker (2 wheeled);2 person hand held assist Transfers: Sit to/from Stand Sit to Stand: Min assist;+2 safety/equipment         General transfer comment: Pt required cues for hand placement and reported feeling "jittery" when on his feet due to pain.  Ambulation/Gait Ambulation/Gait assistance: Min assist;+2 physical assistance Gait Distance (Feet): 3 Feet Assistive device: Rolling walker (2 wheeled) Gait Pattern/deviations: Step-to pattern;Decreased stride length Gait velocity: decreased Gait velocity interpretation: <1.31 ft/sec, indicative of household ambulator General Gait Details: heavy reliance on RW, lateral steps to [facility] as pt declined further gait due to pain. Cues for sequencing, RW positioning, minA to move RW      Balance Overall balance assessment: Needs assistance;History of Falls Sitting-balance support: Feet supported;Bilateral upper extremity supported Sitting balance-Leahy Scale: Poor Sitting balance - Comments: Pt with L lean but could correct with cues. Postural control: Left lateral lean Standing balance support: During functional activity;Bilateral upper extremity supported Standing balance-Leahy Scale: Poor Standing balance comment: reliant on walker  and outside assist to stand                             Pertinent Vitals/Pain Pain Assessment: Faces Faces Pain Scale: Hurts whole lot Pain Location: neck and bilateral shoulders Pain Descriptors /  Indicators: Discomfort;Grimacing;Sore Pain Intervention(s): Limited activity within patient's tolerance;Monitored during session;Repositioned;Ice applied    Home Living Family/patient expects to be discharged to:: Private residence Living Arrangements: Spouse/significant other Available Help at Discharge: Family;Available 24 hours/day Type of Home: House Home Access: Level entry     Home Layout: One level Home Equipment: Cane - single point;Walker - 2 wheels;Hand held shower head Additional Comments: does want to use w/c    Prior Function Level of Independence: Independent with assistive device(s)         Comments: 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 Mobilizing with cane until 3 weeks ago, since then has had multiple falls and used RW     Hand Dominance   Dominant Hand: Right    Extremity/Trunk Assessment   Upper Extremity Assessment Upper Extremity Assessment: Defer to OT evaluation LUE Deficits / Details: ROM limited to 90 degrees in L shoulder due to pain an old rotator cuff injury.  Strength:  shoulder 2/5, biceps 4/5, triceps 3+/5, wrist extension 3+/5, grip 3/5, finger extension 2+/5. LUE: Unable to fully assess due to pain LUE Sensation: decreased light touch LUE Coordination: decreased fine motor;decreased gross motor    Lower Extremity Assessment Lower Extremity Assessment: Overall WFL for tasks assessed (pt denies difference in sensation bilaterall)    Cervical / Trunk Assessment Cervical / Trunk Assessment: Other exceptions Cervical / Trunk Exceptions: ACDF in 10/2019 and C3-C6 decompressive laminectomy on this admission.  Communication   Communication: No difficulties  Cognition Arousal/Alertness: Awake/alert Behavior During Therapy: WFL for tasks assessed/performed Overall Cognitive Status: Within Functional Limits for tasks assessed                                        General Comments General  comments (skin integrity, edema, etc.): significant swelling in posterior neck    Exercises     Assessment/Plan    PT Assessment Patient needs continued PT services  PT Problem List Decreased strength;Decreased activity tolerance;Decreased balance;Decreased mobility;Decreased coordination;Pain       PT Treatment Interventions DME instruction;Gait training;Stair training;Functional mobility training;Therapeutic activities;Therapeutic exercise;Balance training;Patient/family education    PT Goals (Current goals can be found in the Care Plan section)  Acute Rehab PT Goals Patient Stated Goal: to get rid of pain and walker better PT Goal Formulation: With patient Time For Goal Achievement: 10/02/20 Potential to Achieve Goals: Good    Frequency Min 5X/week        Co-evaluation PT/OT/SLP Co-Evaluation/Treatment: Yes Reason for Co-Treatment: Complexity of the patient's impairments (multi-system involvement);For patient/therapist safety;To address functional/ADL transfers PT goals addressed during session: Mobility/safety with mobility;Balance;Proper use of DME;Strengthening/ROM         AM-PAC PT "6 Clicks" Mobility  Outcome Measure Help needed turning from your back to your side while in a flat bed without using bedrails?: A Little Help needed moving from lying on your back to sitting on the side of a flat bed without using bedrails?: A Little Help needed moving to and from a bed to a chair (including a wheelchair)?: A Lot  Help needed standing up from a chair using your arms (e.g., wheelchair or bedside chair)?: A Little Help needed to walk in hospital room?: A Lot Help needed climbing 3-5 steps with a railing? : A Lot 6 Click Score: 15    End of Session Equipment Utilized During Treatment: Gait belt Activity Tolerance: Patient limited by pain Patient left: in bed;with call bell/phone within reach Nurse Communication: Mobility status PT Visit Diagnosis: Other abnormalities  of gait and mobility (R26.89);Muscle weakness (generalized) (M62.81);Pain Pain - part of body:  (neck)    Time: 5733-4483 PT Time Calculation (min) (ACUTE ONLY): 26 min   Charges:   PT Evaluation $PT Eval Moderate Complexity: 1 Mod          Karma Ganja, PT, DPT   Acute Rehabilitation Department Pager #: [phone]  Otho Bellows 09/18/2020, 12:32 PM

## 2020-09-18 NOTE — Progress Notes (Signed)
  NEUROSURGERY PROGRESS NOTE   No issues overnight. Pt reports significant posterior neck pain. Reports resolution of left facial numbness and improvement in LUE numbness.  EXAM:  BP (!) 145/72 (BP Location: Left Arm)   Pulse 84   Temp 98.4 F (36.9 C)   Resp 20   Ht 5\' 9"  (1.753 m)   Wt 99.2 kg   SpO2 98%   BMI 32.30 kg/m   Awake, alert, oriented  Speech fluent, appropriate  CN grossly intact  Good strength throughout Wound c/d/i  IMPRESSION:  74 y.o. male POD#1 s/p decompressive cervical laminectomy C3-6, doing well Concurrent vertebral artery origin stenosis  PLAN: - Cont to mobilize - Dispo planning per PT/OT evals - Conservative mgmt of vert stenosis with outpatient follow-up (statin, ASA starting on POD# 5, DM mgmt)

## 2020-09-18 NOTE — Progress Notes (Signed)
Inpatient Rehab Admissions Coordinator Note:   Per PT/OT recommendations, pt was screened for CIR candidacy by Gayland Curry, MS, CCC-SLP.  At this time we are recommending an inpatient rehab consult.  AC will contact attending to request a consult. Please contact me with questions.    Gayland Curry, [place], [place] Admissions Coordinator [phone] 09/18/20 12:59 PM

## 2020-09-18 NOTE — Evaluation (Signed)
Occupational Therapy Evaluation Patient Details Name: [name] [name]. MRN: [id number] DOB: Jul 01, 1946 Today's Date: 09/18/2020    History of Present Illness Pt is a 74 yo male admitted for C3-C6 decompressive cervical laminectomy.  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  Pt with past ACDF in 10/2019.  Pt had "event" on 07/05/20 that caused him to go weak in L side and has been weak ever since.  No noted CVA.  Unsure origin of this weakness.  Pt with old L rotator cuff repair and residual ROM issues from that surgery years ago.   Clinical Impression   Pt admitted with the above diagnosis and has the deficits listed below. Pt would benefit from cont OT to increase independence and safety with all adls and reduce the risk of falls while completing adls so he can d/c home safely with his wife to his mobile home.  Pt was very independent prior to August of this year and would like to return to being independent.  Feel with rehab followed by home therapy at home with wife, pt may be able to achieve this.  Will focus on LUE functional use during adls and adls in standing in next session.      Follow Up Recommendations  CIR;Supervision/Assistance - 24 hour    Equipment Recommendations  Tub/shower seat    Recommendations for Other Services Rehab consult     Precautions / Restrictions Precautions Precautions: Fall;Cervical Precaution Comments: very painful neck with all movement      Mobility Bed Mobility Overal bed mobility: Needs Assistance Bed Mobility: Supine to Sit;Sit to Supine     Supine to sit: Min assist;HOB elevated Sit to supine: Min assist   General bed mobility comments: Pt required cues to use bedrail and bring L hip around to sit with both feet on floor.  Movment in bed is very painful.    Transfers Overall transfer level: Needs assistance Equipment used: Rolling walker (2 wheeled);2 person hand held assist Transfers: Sit to/from Stand Sit to  Stand: Min assist;+2 safety/equipment         General transfer comment: Pt required cues for hand placement and reported feeling "jittery" when on his feet due to pain.    Balance Overall balance assessment: Needs assistance;History of Falls Sitting-balance support: Feet supported;Bilateral upper extremity supported Sitting balance-Leahy Scale: Poor Sitting balance - Comments: Pt with L lean but could correct with cues. Postural control: Left lateral lean Standing balance support: During functional activity;Bilateral upper extremity supported Standing balance-Leahy Scale: Poor Standing balance comment: reliant on walker  and outside assist to stand                           ADL either performed or assessed with clinical judgement   ADL Overall ADL's : Needs assistance/impaired Eating/Feeding: Set up;Sitting;Bed level Eating/Feeding Details (indicate cue type and reason): too much pain in neck to sit up too long without support on the neck.   Grooming: Wash/dry hands;Wash/dry face;Oral care;Minimal assistance;Sitting Grooming Details (indicate cue type and reason): occasional min assist due to LUE coordination deficits. Upper Body Bathing: Moderate assistance;Sitting   Lower Body Bathing: Maximal assistance;Sit to/from stand;Cueing for compensatory techniques Lower Body Bathing Details (indicate cue type and reason): Pt unable to tolerate full bathing adl at this time due to pain but can do small sections with rest breaks. Upper Body Dressing : Moderate assistance;Sitting;Cueing for compensatory techniques   Lower Body Dressing: Moderate assistance;Sit  to/from stand;Cueing for compensatory techniques   Toilet Transfer: Minimal assistance;+2 for safety/equipment;Stand-pivot;BSC Toilet Transfer Details (indicate cue type and reason): pt can pivot to [facility].  Declined walking to bathroom due to pain.  Pt with L lean when up. Toileting- Clothing Manipulation and Hygiene: Minimal  assistance;Sit to/from stand;Cueing for compensatory techniques       Functional mobility during ADLs: Minimal assistance;Rolling walker;Cueing for safety General ADL Comments: Pt limited with all adls due to pain and L side weakness.  Pt leans to left and can correct with cues but often times does not correct on own.      Vision Baseline Vision/History: No visual deficits Patient Visual Report: No change from baseline Vision Assessment?: No apparent visual deficits     Perception Perception Perception Tested?: No   Praxis Praxis Praxis tested?: Within functional limits    Pertinent Vitals/Pain Pain Assessment: Faces Faces Pain Scale: Hurts whole lot     Hand Dominance Right   Extremity/Trunk Assessment Upper Extremity Assessment Upper Extremity Assessment: LUE deficits/detail LUE Deficits / Details: ROM limited to 90 degrees in L shoulder due to pain an old rotator cuff injury.  Strength:  shoulder 2/5, biceps 4/5, triceps 3+/5, wrist extension 3+/5, grip 3/5, finger extension 2+/5. LUE: Unable to fully assess due to pain LUE Sensation: decreased light touch LUE Coordination: decreased fine motor;decreased gross motor   Lower Extremity Assessment Lower Extremity Assessment: Defer to PT evaluation   Cervical / Trunk Assessment Cervical / Trunk Assessment: Other exceptions Cervical / Trunk Exceptions: ACDF in 10/2019 and C3-C6 decompressive laminectomy on this admission.   Communication Communication Communication: No difficulties   Cognition Arousal/Alertness: Awake/alert Behavior During Therapy: WFL for tasks assessed/performed Overall Cognitive Status: Within Functional Limits for tasks assessed                                     General Comments  Pt with swelling noted in posterior neck area.    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 Mobilizing with cane until 3 weeks ago, since then has had multiple falls and used RW        OT Problem List: Decreased strength;Decreased range of motion;Decreased activity tolerance;Impaired balance (sitting and/or standing);Decreased coordination;Decreased knowledge of use of DME or AE;Decreased knowledge of precautions;Impaired sensation;Impaired UE functional use;Pain;Increased edema      OT Treatment/Interventions: Self-care/ADL training;Therapeutic activities;Balance training;DME and/or AE instruction    OT Goals(Current goals can be found in the care plan section) Acute Rehab OT Goals Patient Stated Goal: to get rid of pain and walker better OT Goal Formulation: With patient Time For Goal Achievement: 10/02/20 Potential to Achieve Goals: Good ADL Goals Pt Will Perform Eating: with set-up;sitting Pt Will Perform Grooming: with supervision;standing  Pt Will Perform Lower Body Bathing: with supervision;sit to/from stand Pt Will Perform Upper Body Dressing: with set-up;sitting Pt Will Perform Lower Body Dressing: with set-up;sit to/from stand Additional ADL Goal #1: Pt will walk to bathroom with walker and complete all toileting on 3:1 over commode with supervision.  OT Frequency: Min 2X/week   Barriers to D/C:    wife can assist at home       Co-evaluation              AM-PAC OT "6 Clicks" Daily Activity     Outcome Measure Help  from another person eating meals?: A Little Help from another person taking care of personal grooming?: A Little Help from another person toileting, which includes using toliet, bedpan, or urinal?: A Little Help from another person bathing (including washing, rinsing, drying)?: A Lot Help from another person to put on and taking off regular upper body clothing?: A Lot Help from another person to put on and taking off regular lower body clothing?: A Lot 6 Click Score: 15   End of Session Equipment Utilized During Treatment: Rolling walker;Gait belt Nurse Communication: Mobility status  Activity Tolerance: Patient limited by pain Patient left: in bed;with call bell/phone within reach  OT Visit Diagnosis: Unsteadiness on feet (R26.81);Other symptoms and signs involving the nervous system (R29.898);Hemiplegia and hemiparesis;Pain Hemiplegia - Right/Left: Left Hemiplegia - dominant/non-dominant: Non-Dominant Hemiplegia - caused by: Unspecified Pain - Right/Left: Right Pain - part of body: Shoulder (pain is in both shoulders and neck )                Time: 2263-3354 OT Time Calculation (min): 26 min Charges:  OT General Charges $OT Visit: 1 Visit OT Evaluation $OT Eval Moderate Complexity: 1 Mod  Glenford Peers 09/18/2020, 10:10 AM

## 2020-09-19 ENCOUNTER — Other Ambulatory Visit: Payer: Self-pay

## 2020-09-19 LAB — RENAL FUNCTION PANEL
Albumin: 3.6 g/dL (ref 3.5–5.0)
Anion gap: 12 (ref 5–15)
BUN: 14 mg/dL (ref 8–23)
CO2: 26 mmol/L (ref 22–32)
Calcium: 8.8 mg/dL — ABNORMAL LOW (ref 8.9–10.3)
Chloride: 97 mmol/L — ABNORMAL LOW (ref 98–111)
Creatinine, Ser: 0.91 mg/dL (ref 0.61–1.24)
GFR, Estimated: >60 mL/min (ref >60)
Glucose, Bld: 180 mg/dL — ABNORMAL HIGH (ref 70–99)
Phosphorus: 3.2 mg/dL (ref 2.5–4.6)
Potassium: 3.7 mmol/L (ref 3.5–5.1)
Sodium: 135 mmol/L (ref 135–145)

## 2020-09-19 LAB — GLUCOSE, CAPILLARY
Glucose-Capillary: 172 mg/dL — ABNORMAL HIGH (ref 70–99)
Glucose-Capillary: 193 mg/dL — ABNORMAL HIGH (ref 70–99)
Glucose-Capillary: 190 mg/dL — ABNORMAL HIGH (ref 70–99)
Glucose-Capillary: 186 mg/dL — ABNORMAL HIGH (ref 70–99)

## 2020-09-19 MED ORDER — COLCHICINE 0.6 MG PO TABS
0.6000 mg | ORAL_TABLET | Freq: Two times a day (BID) | ORAL | Status: DC | PRN
  Administered 2020-09-19 (×2): 0.6 mg via ORAL
  Filled 2020-09-19 (×2): qty 1

## 2020-09-19 NOTE — Progress Notes (Signed)
Tried to ambulate patient in room and he declined stating his gout in his foot hurts. Encouraged him and educated him about early mobility. Patient then agreed to try. Some difficulty standing up and leaning to the left. Took four steps and started going down. Assisted back to chair to prevent fall.

## 2020-09-19 NOTE — Care Management Important Message (Signed)
Important Message  Patient Details  Name: [name] [name]. MRN: [id number] Date of Birth: 11-16-1945   Medicare Important Message Given:  Yes     Nikka Hakimian 09/19/2020, 1:44 PM

## 2020-09-19 NOTE — Progress Notes (Signed)
Physical Therapy Treatment Patient Details Name: [name] [name]. MRN: [id number] DOB: [birth year] Today's Date: 09/19/2020    History of Present Illness Pt is a 74 yo male admitted for C3-C6 decompressive cervical laminectomy.  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  Pt now reports gout flair in L foot and medications have been started per RN on 09/19/20. Pt with past ACDF in 10/2019.  Pt had "event" on 07/05/20 that caused him to go weak in L side and has been weak ever since.  No noted CVA.  Unsure origin of this weakness.  Pt with old L rotator cuff repair and residual ROM issues from that surgery years ago.    PT Comments    Pt was limited today due to pain in L foot.  He reports that he always gets a gout flair after surgery and that he has been started on his meds.  He reports based on past experiences with gout , he expects his pain to improve tomorrow and will be able to increase mobility with therapy.  Pt did participate with several sit to stand transfers and weight shifting activity, but was unable to take a step due to pain and fear of pain/falling.  He does lean to the left at times with weight shifting requiring mod A.  Pt additionally with L UE pain/neck pain that limits ability to compensate with upper extremities.  Pt is motivated with good family support.  He is expected to progress well with PT once gout pain improves.    Follow Up Recommendations  CIR     Equipment Recommendations  3in1 (PT) (further assessment next venue)    Recommendations for Other Services Rehab consult     Precautions / Restrictions Precautions Precautions: Fall;Cervical Precaution Comments: very painful neck with all movement; has vertebral artery stenosis    Mobility  Bed Mobility               General bed mobility comments: In chair  Transfers Overall transfer level: Needs assistance Equipment used: Rolling walker (2 wheeled) Transfers: Sit to/from  Stand Sit to Stand: Min assist;+2 physical assistance         General transfer comment: Performed sit to stand x 3; cues for safe hand placement; increased time  Ambulation/Gait             General Gait Details: Pt declined today due to gout pain in L foot and unable to compensate with UE cue to L shoulder pain.  Reports he has started his gout medicine and feels pain will be improved to morrow for ambulation.  States "I always get a gout flair after surgery."   Stairs             Wheelchair Mobility    Modified Rankin (Stroke Patients Only)       Balance Overall balance assessment: Needs assistance;History of Falls Sitting-balance support: Feet supported;No upper extremity supported Sitting balance-Leahy Scale: Fair       Standing balance-Leahy Scale: Poor Standing balance comment: Used RW and min guard of 2 for static stance.  Had pt weight shift side to side and when coming to the left pt occaionally required mod A.                            Cognition Arousal/Alertness: Awake/alert Behavior During Therapy: WFL for tasks assessed/performed Overall Cognitive Status: Within Functional Limits for tasks assessed  Exercises General Exercises - Lower Extremity Ankle Circles/Pumps: AROM;Right;AAROM;Left;10 reps;Seated (gentle motion on L) Long Arc Quad: AROM;Both;10 reps;Seated Hip Flexion/Marching: AROM;Both;10 reps;Seated    General Comments   Pt reports a history of "faintness."  Did not pt has vertebral artery stenosis that is being managed conservatively per neuro notes.       Pertinent Vitals/Pain Pain Assessment: 0-10 Faces Pain Scale: Hurts worst Pain Location: L upper trap (described as tension relieved with muscle relaxers) and L foot (gout) Pain Descriptors / Indicators: Discomfort;Grimacing;Sore;Guarding Pain Intervention(s): Limited activity within patient's tolerance;Monitored  during session;Premedicated before session;Relaxation    Home Living                      Prior Function            PT Goals (current goals can now be found in the care plan section) Acute Rehab PT Goals Patient Stated Goal: to get rid of pain and walker better PT Goal Formulation: With patient Time For Goal Achievement: 10/02/20 Potential to Achieve Goals: Good Progress towards PT goals: Progressing toward goals    Frequency    Min 5X/week      PT Plan Current plan remains appropriate    Co-evaluation              AM-PAC PT "6 Clicks" Mobility   Outcome Measure  Help needed turning from your back to your side while in a flat bed without using bedrails?: A Little Help needed moving from lying on your back to sitting on the side of a flat bed without using bedrails?: A Little Help needed moving to and from a bed to a chair (including a wheelchair)?: A Lot Help needed standing up from a chair using your arms (e.g., wheelchair or bedside chair)?: A Lot Help needed to walk in hospital room?: A Lot Help needed climbing 3-5 steps with a railing? : Total 6 Click Score: 13    End of Session Equipment Utilized During Treatment: Gait belt Activity Tolerance: Patient limited by pain Patient left: with call bell/phone within reach;in chair;with family/visitor present Nurse Communication: Mobility status PT Visit Diagnosis: Other abnormalities of gait and mobility (R26.89);Muscle weakness (generalized) (M62.81);Pain Pain - part of body:  (Neck, L shoulder, L foot)     Time: 1856-3149 PT Time Calculation (min) (ACUTE ONLY): 31 min  Charges:  $Therapeutic Exercise: 8-22 mins $Therapeutic Activity: 8-22 mins                     Abran Richard, PT Acute Rehab Services Pager [phone] [name] [phone]     [name] 09/19/2020, 3:28 PM

## 2020-09-19 NOTE — Progress Notes (Signed)
Inpatient Rehab Admissions:  Inpatient Rehab Consult received.  I met with patient and his son, Chrissie Noa, at the bedside for rehabilitation assessment and to discuss goals and expectations of an inpatient rehab admission.  We discussed expected length of stay to be about 2 weeks, with goals of supervision to Tad Moore states that 24/7 supervision will not be an issue between his mother, his sister, and himself.  I also let them know that I would need insurance authorization for CIR, and that I will start that process today.  [employer] typically takes 48-72 hours to return a determination so I will follow up with them Thursday, unless I hear sooner.    Signed: Shann Medal, PT, DPT Admissions Coordinator [phone] 09/19/20  12:44 PM

## 2020-09-20 LAB — RENAL FUNCTION PANEL
Albumin: 3.2 g/dL — ABNORMAL LOW (ref 3.5–5.0)
Anion gap: 9 (ref 5–15)
BUN: 16 mg/dL (ref 8–23)
CO2: 25 mmol/L (ref 22–32)
Calcium: 8.8 mg/dL — ABNORMAL LOW (ref 8.9–10.3)
Chloride: 99 mmol/L (ref 98–111)
Creatinine, Ser: 0.85 mg/dL (ref 0.61–1.24)
GFR, Estimated: >60 mL/min (ref >60)
Glucose, Bld: 186 mg/dL — ABNORMAL HIGH (ref 70–99)
Phosphorus: 3.3 mg/dL (ref 2.5–4.6)
Potassium: 4.1 mmol/L (ref 3.5–5.1)
Sodium: 133 mmol/L — ABNORMAL LOW (ref 135–145)

## 2020-09-20 LAB — GLUCOSE, CAPILLARY
Glucose-Capillary: 176 mg/dL — ABNORMAL HIGH (ref 70–99)
Glucose-Capillary: 230 mg/dL — ABNORMAL HIGH (ref 70–99)
Glucose-Capillary: 212 mg/dL — ABNORMAL HIGH (ref 70–99)

## 2020-09-20 MED ORDER — COLCHICINE 0.6 MG PO TABS
0.6000 mg | ORAL_TABLET | Freq: Two times a day (BID) | ORAL | Status: DC
  Administered 2020-09-20 – 2020-09-21 (×4): 0.6 mg via ORAL
  Filled 2020-09-20 (×4): qty 1

## 2020-09-20 NOTE — Progress Notes (Signed)
  NEUROSURGERY PROGRESS NOTE   No issues overnight. Cont to c/o of left foot gout pain, now seems to interfere with his therapy.  EXAM:  BP 133/73 (BP Location: Left Arm)   Pulse 83   Temp 98.7 F (37.1 C)   Resp 16   Ht 5\' 9"  (1.753 m)   Wt 99.2 kg   SpO2 94%   BMI 32.30 kg/m   Awake, alert, oriented  Speech fluent, appropriate  CN grossly intact  5/5 BUE/BLE   IMPRESSION:  74 y.o. male s/p cervical lam for decompression, likely left foot gout flare-up  PLAN: - Will start colchicine for gout flare - Cont PT/OT - CIR admission insurance approval pending

## 2020-09-20 NOTE — Progress Notes (Addendum)
  NEUROSURGERY PROGRESS NOTE   No issues overnight. Has stable neck pain, primarily c/o gout pain in his left foot.  EXAM:  BP 133/73 (BP Location: Left Arm)   Pulse 83   Temp 98.7 F (37.1 C)   Resp 16   Ht 5\' 9"  (1.753 m)   Wt 99.2 kg   SpO2 94%   BMI 32.30 kg/m   Awake, alert, oriented  Speech fluent, appropriate  CN grossly intact  MAE well Wound c/d/i  IMPRESSION:  74 y.o. male s/p cervical lami for decompression, progressing well  PLAN: - Cont to mobilize with PT/OT - Consult PMR for possible CIR

## 2020-09-20 NOTE — PMR Pre-admission (Signed)
PMR Admission Coordinator Pre-Admission Assessment  Patient: [name] [name]. is an 74 y.o., male MRN: [id number] DOB: 1945-12-28 Height: 5' 9" (175.3 cm) Weight: 99.2 kg  Insurance Information HMO:     PPO:      PCP:      IPA:      80/20:      OTHER:  PRIMARY: [employer] Medicare      Policy#: [id number]      Subscriber: pt CM Name: Nira Conn      Phone#: n/a     Fax#: [phone] Pre-Cert#: [id number] auth for CIR provided by Nira Conn with [facility] with updates due to Viviana Simpler (ph [phone]) at fax listed above on 11/16.        Employer:  Benefits:  Phone #: [phone]     Name:  Eff. Date: 11/12/19     Deduct: $0      Out of Pocket Max: $5000  [phone] met)      Life Max: n/a CIR: $295/day for days 1-6      SNF: 20 full days Outpatient:      Co-Pay: $35/visit Home Health: 100%      Co-Pay:  DME: 80%     Co-Pay: 20% Providers:  SECONDARY:       Policy#:      Phone#:   Development worker, community:       Phone#:   The Therapist, art Information Summary" for patients in Inpatient Rehabilitation Facilities with attached "Privacy Act [place] Records" was provided and verbally reviewed with: Patient and Family  Emergency Contact Information Contact Information    Name Relation Home Work [place] Spouse [phone]     [facility] Daughter   [phone]   Emi [place]   [phone]      Current Medical History  Patient Admitting Diagnosis: cervical myelopathy  History of Present Illness: Pt is a 74 y/o male with PMH of CAD, DM, gout, HTN, and MI who presents to [facility] at the request of his neurosurgeon on 11/5 with c/o worsening weakness and falls following a ? CVA in August 2021 (imaging did not confirm).  ED exam showed numbness to L face, LUE, LLE and weakness in LUE/LLE compared to R.  Per Dr. Zada Finders, pt had been having syncopal episodes in the past, and felt weakness was worsening over the last few weeks.  He does endorse  pre-syncopal symptoms when rotating head to the L and combined with his history of vascular disease, was concerning for Bow Hunter's syndrome.  Requested MRI brain/Cspine, CTA with head turn left.  Also consulted Neurology.  MRI brain was negative.  MRI cspine showed multilevel spinal and foraminal stenosis due to spurring as well as a large osteophyte on the L at C5-6 with several spinal encroachment at the same level.  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  Pt underwent C3-C6 laminectomy and bilateral forminotomies per Dr. Zada Finders on 11/7.  Post op course pain management, management of gout flare-up, and DM management.  Therapy evaluations were completed and pt was recommended for CIR.     Patient's medical record from [facility] has been reviewed by the rehabilitation admission coordinator and physician.  Past Medical History  Past Medical History:  Diagnosis Date  . CAD (coronary artery disease)   . Cancer ([place])   . Diabetes ([place])   . Dyslipidemia   . Gout   . History  of kidney stones   . HTN (hypertension)   . Myocardial infarction [facility])     Family History   family history includes Colon cancer (age of onset: 69) in his brother; Heart attack (age of onset: 67) in his father.  Prior Rehab/Hospitalizations Has the patient had prior rehab or hospitalizations prior to admission? Yes  Has the patient had major surgery during 100 days prior to admission? Yes   Current Medications  Current Facility-Administered Medications:  .  0.9 %  sodium chloride infusion, , Intravenous, Continuous, Ostergard, Joyice Faster, MD, Last Rate: 75 mL/hr at 09/17/20 0001, New Bag at 09/17/20 0001 .  acetaminophen (TYLENOL) tablet 650 mg, 650 mg, Oral, Q4H PRN, 650 mg at 09/17/20 1408 **OR** acetaminophen (TYLENOL) suppository 650 mg, 650 mg, Rectal, Q4H PRN, Judith Part, MD .  atorvastatin (LIPITOR)  tablet 20 mg, 20 mg, Oral, QHS, Ostergard, Joyice Faster, MD, 20 mg at 09/20/20 2116 .  colchicine tablet 0.6 mg, 0.6 mg, Oral, BID PRN, Consuella Lose, MD, 0.6 mg at 09/19/20 2203 .  colchicine tablet 0.6 mg, 0.6 mg, Oral, BID, Consuella Lose, MD, 0.6 mg at 09/21/20 0825 .  cyclobenzaprine (FLEXERIL) tablet 10 mg, 10 mg, Oral, TID PRN, Judith Part, MD, 10 mg at 09/21/20 0825 .  docusate sodium (COLACE) capsule 100 mg, 100 mg, Oral, BID, Judith Part, MD, 100 mg at 09/21/20 0825 .  gabapentin (NEURONTIN) capsule 300 mg, 300 mg, Oral, TID, Judith Part, MD, 300 mg at 09/21/20 0825 .  HYDROmorphone (DILAUDID) injection 1 mg, 1 mg, Intravenous, Q3H PRN, Judith Part, MD, 1 mg at 09/18/20 0730 .  insulin aspart (novoLOG) injection 0-15 Units, 0-15 Units, Subcutaneous, TID WC, Ostergard, Joyice Faster, MD, 5 Units at 09/20/20 1226 .  insulin aspart (novoLOG) injection 0-5 Units, 0-5 Units, Subcutaneous, QHS, Ostergard, Thomas A, MD .  menthol-cetylpyridinium (CEPACOL) lozenge 3 mg, 1 lozenge, Oral, PRN **OR** phenol (CHLORASEPTIC) mouth spray 1 spray, 1 spray, Mouth/Throat, PRN, Ostergard, Thomas A, MD .  metoprolol succinate (TOPROL-XL) 24 hr tablet 50 mg, 50 mg, Oral, Daily, Judith Part, MD, 50 mg at 09/21/20 0824 .  ondansetron (ZOFRAN) tablet 4 mg, 4 mg, Oral, Q6H PRN **OR** ondansetron (ZOFRAN) injection 4 mg, 4 mg, Intravenous, Q6H PRN, Ostergard, Thomas A, MD .  oxyCODONE (Oxy IR/ROXICODONE) immediate release tablet 10 mg, 10 mg, Oral, Q4H PRN, Judith Part, MD, 10 mg at 09/21/20 0825 .  oxyCODONE (Oxy IR/ROXICODONE) immediate release tablet 5 mg, 5 mg, Oral, Q4H PRN, Ostergard, Thomas A, MD .  pantoprazole (PROTONIX) EC tablet 40 mg, 40 mg, Oral, Daily, Judith Part, MD, 40 mg at 09/21/20 0824 .  polyethylene glycol (MIRALAX / GLYCOLAX) packet 17 g, 17 g, Oral, Daily PRN, Judith Part, MD .  tamsulosin (FLOMAX) capsule 0.4 mg, 0.4 mg, Oral,  QHS, Ostergard, Joyice Faster, MD, 0.4 mg at 09/20/20 2116  Patients Current Diet:  Diet Order            Diet Carb Modified Fluid consistency: Thin; Room service appropriate? Yes  Diet effective now                 Precautions / Restrictions Precautions Precautions: Fall, Cervical Precaution Comments: very painful neck with all movement; has vertebral artery stenosis Restrictions Weight Bearing Restrictions: No   Has the patient had 2 or more falls or a fall with injury in the past year? Yes  Prior Activity Level Limited Community (1-2x/wk): had been  going out less since Covid started in 2020.  Not mobilizing as much since then.  Using DME for mobility due to pain, using cane then RW  Prior Functional Level Self Care: Did the patient need help bathing, dressing, using the toilet or eating? Independent  Indoor Mobility: Did the patient need assistance with walking from room to room (with or without device)? Independent  Stairs: Did the patient need assistance with internal or external stairs (with or without device)? Independent  Functional Cognition: Did the patient need help planning regular tasks such as shopping or remembering to take medications? Independent  Home Assistive Devices / Equipment Home Assistive Devices/Equipment: Cane (specify quad or straight) Home Equipment: Cane - single point, Walker - 2 wheels, Hand held shower head  Prior Device Use: Indicate devices/aids used by the patient prior to current illness, exacerbation or injury? Walker and cane  Current Functional Level Cognition  Overall Cognitive Status: Within Functional Limits for tasks assessed Orientation Level: Oriented X4    Extremity Assessment (includes Sensation/Coordination)  Upper Extremity Assessment: Defer to OT evaluation LUE Deficits / Details: ROM limited to 90 degrees in L shoulder due to pain an old rotator cuff injury.  Strength:  shoulder 2/5, biceps 4/5, triceps 3+/5, wrist  extension 3+/5, grip 3/5, finger extension 2+/5. LUE: Unable to fully assess due to pain LUE Sensation: decreased light touch LUE Coordination: decreased fine motor, decreased gross motor  Lower Extremity Assessment: Overall WFL for tasks assessed (pt denies difference in sensation bilaterall)    ADLs  Overall ADL's : Needs assistance/impaired Eating/Feeding: Set up, Sitting Eating/Feeding Details (indicate cue type and reason): too much pain in neck to sit up too long without support on the neck.   Grooming: Wash/dry hands, Wash/dry face, Oral care, Minimal assistance, Sitting Grooming Details (indicate cue type and reason): occasional min assist due to LUE coordination deficits. Upper Body Bathing: Moderate assistance, Sitting Lower Body Bathing: Maximal assistance, Sit to/from stand, Cueing for compensatory techniques Lower Body Bathing Details (indicate cue type and reason): Pt unable to tolerate full bathing adl at this time due to pain but can do small sections with rest breaks. Upper Body Dressing : Moderate assistance, Sitting, Cueing for compensatory techniques Lower Body Dressing: Moderate assistance, Sit to/from stand, Cueing for compensatory techniques Toilet Transfer: Minimal assistance, +2 for safety/equipment, Stand-pivot, BSC Toilet Transfer Details (indicate cue type and reason): pt can pivot to [facility].  Declined walking to bathroom due to pain.  Pt with L lean when up. Toileting- Clothing Manipulation and Hygiene: Minimal assistance, Sit to/from stand, Cueing for compensatory techniques Functional mobility during ADLs: Minimal assistance, Rolling walker, Cueing for safety General ADL Comments: session focus on seated activity and bil UE movement/stretching given bil shoulder pain (L>R)     Mobility  Overal bed mobility: Needs Assistance Bed Mobility: Supine to Sit, Sit to Supine Supine to sit: Min assist, HOB elevated Sit to supine: Min assist General bed mobility comments:  OOB in recliner upon arrival     Transfers  Overall transfer level: Needs assistance Equipment used: Rolling walker (2 wheeled) Transfers: Sit to/from Stand Sit to Stand: Min assist, +2 physical assistance General transfer comment: deferred given L ankle/foot pain (reports gout)     Ambulation / Gait / Stairs / Wheelchair Mobility  Ambulation/Gait Ambulation/Gait assistance: Min assist, +2 physical assistance Gait Distance (Feet): 3 Feet Assistive device: Rolling walker (2 wheeled) Gait Pattern/deviations: Step-to pattern, Decreased stride length General Gait Details: Pt declined today due to gout pain  in L foot and unable to compensate with UE cue to L shoulder pain.  Reports he has started his gout medicine and feels pain will be improved to morrow for ambulation.  States "I always get a gout flair after surgery." Gait velocity: decreased Gait velocity interpretation: <1.31 ft/sec, indicative of household ambulator    Posture / Balance Dynamic Sitting Balance Sitting balance - Comments: Pt with L lean but could correct with cues. Balance Overall balance assessment: Needs assistance, History of Falls Sitting-balance support: Feet supported, No upper extremity supported Sitting balance-Leahy Scale: Fair Sitting balance - Comments: Pt with L lean but could correct with cues. Postural control: Left lateral lean Standing balance support: During functional activity, Bilateral upper extremity supported Standing balance-Leahy Scale: Poor Standing balance comment: Used RW and min guard of 2 for static stance.  Had pt weight shift side to side and when coming to the left pt occaionally required mod A.    Special needs/care consideration Skin surgical incision to posterior neck and Diabetic management yes   Previous Home Environment (from acute therapy documentation) Living Arrangements: Spouse/significant other Available Help at Discharge: Family, Available 24 hours/day Type of Home:  [place]: One level Home Access: Level entry Bathroom Shower/Tub: Multimedia programmer: [place]: No Additional Comments: does want to use w/c  Discharge Living Setting Plans for Discharge Living Setting: Patient's home Type of Home at Discharge: House Discharge Home Layout: One level Discharge Home Access: Level entry Discharge Bathroom Shower/Tub: Walk-in shower Discharge Bathroom Toilet: Standard Discharge Bathroom Accessibility: Yes How Accessible: Accessible via walker Does the patient have any problems obtaining your medications?: No  Social/Family/Support Systems Patient Roles: Spouse Contact Information: Dhruva Orndoff [phone] Anticipated Caregiver: Chrissie Noa (son), Al Corpus (dtr) Anticipated Caregiver's Contact Information: Chrissie Noa [phone]; Al Corpus [phone] Ability/Limitations of Caregiver: spouse can only provide supervision, Chrissie Noa and Al Corpus can provide up to min assist Caregiver Availability: 24/7 Discharge Plan Discussed with Primary Caregiver: Yes Is Caregiver In Agreement with Plan?: Yes Does Caregiver/Family have Issues with Lodging/Transportation while Pt is in Rehab?: No  Goals Patient/Family Goal for Rehab: PT/OT supervisio to mod I, no SLP Expected length of stay: 10-12 days Additional Information: gout flare on 11/8 Pt/Family Agrees to Admission and willing to participate: Yes Program Orientation Provided & Reviewed with Pt/Caregiver Including Roles  & Responsibilities: Yes  Barriers to Discharge: Insurance for SNF coverage  Decrease burden of Care through IP rehab admission: n/a  Possible need for SNF placement upon discharge: Not anticipated  Patient Condition: I have reviewed medical records from [facility], spoken with CM, and patient and son. I met with patient at the bedside for inpatient rehabilitation assessment.  Patient will benefit from ongoing PT and OT, can actively participate in 3 hours of  therapy a day 5 days of the week, and can make measurable gains during the admission.  Patient will also benefit from the coordinated team approach during an Inpatient Acute Rehabilitation admission.  The patient will receive intensive therapy as well as Rehabilitation physician, nursing, social worker, and care management interventions.  Due to safety, skin/wound care, disease management, medication administration, pain management and patient education the patient requires 24 hour a day rehabilitation nursing.  The patient is currently mod assist with mobility and basic ADLs.  Discharge setting and therapy post discharge at home with home health is anticipated.  Patient has agreed to participate in the Acute Inpatient Rehabilitation Program and will admit today.  Preadmission Screen Completed By:  Michel Santee, PT, DPT 09/21/2020 9:55 AM ______________________________________________________________________   Discussed status with Dr. Dagoberto Ligas on 09/21/20  at 9:55 AM  and received approval for admission today.  Admission Coordinator:  Michel Santee, PT, DPT time 9:55 AM Sudie Grumbling 09/21/20    Assessment/Plan: Diagnosis: 1. Does the need for close, 24 hr/day Medical supervision in concert with the patient's rehab needs make it unreasonable for this patient to be served in a less intensive setting? Yes 2. 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 Due to bladder management, bowel management, safety, skin/wound care, disease management, medication administration, pain management and patient education, does the patient require 24 hr/day rehab nursing? Yes 4. Does the patient require coordinated care of a physician, rehab nurse, PT, OT, and SLP to address physical and functional deficits in the context of the above medical diagnosis(es)? Yes Addressing deficits in the  following areas: balance, endurance, locomotion, strength, transferring, bowel/bladder control, bathing, dressing, feeding, grooming and toileting 5. Can the patient actively participate in an intensive therapy program of at least 3 hrs of therapy 5 days a week? Yes 6. The potential for patient to make measurable gains while on inpatient rehab is good 7. Anticipated functional outcomes upon discharge from inpatient rehab: modified independent and supervision PT, modified independent and supervision OT, n/a SLP 8. Estimated rehab length of stay to reach the above functional goals is: 10-12 days 9. Anticipated discharge destination: Home 10. Overall Rehab/Functional Prognosis: good   MD Signature:

## 2020-09-20 NOTE — Progress Notes (Signed)
Occupational Therapy Treatment Patient Details Name: [name] [name]. MRN: [id number] DOB: 06-16-1946 Today's Date: 09/20/2020    History of present illness Pt is a 74 yo male admitted for C3-C6 decompressive cervical laminectomy.  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  Pt now reports gout flair in L foot and medications have been started per RN on 09/19/20. Pt with past ACDF in 10/2019.  Pt had "event" on 07/05/20 that caused him to go weak in L side and has been weak ever since.  No noted CVA.  Unsure origin of this weakness.  Pt with old L rotator cuff repair and residual ROM issues from that surgery years ago.   OT comments  Pt presents seated in recliner, agreeable to chair level/seated activity only today given ongoing pain d/t gout. RN in to provide pain meds/meds for gout start of session. Focus of session on bil UE activity/HEP given pain in R shoulder and L shoulder/trap (L>R). Educated in gentle ROM/stretching exercises to reduce muscle stiffness and increase ROM with pt return demonstrating throughout. Pt requiring active assist/facilitation to achieve L shoulder flexion (limited to grossly 45* today). Anticipate pt will be able to tolerate increased activity as gout pain improves. Will continue per POC at this time.    Follow Up Recommendations  CIR;Supervision/Assistance - 24 hour    Equipment Recommendations  Tub/shower seat          Precautions / Restrictions Precautions Precautions: Fall;Cervical Precaution Comments: very painful neck with all movement; has vertebral artery stenosis Restrictions Weight Bearing Restrictions: No       Mobility Bed Mobility               General bed mobility comments: OOB in recliner upon arrival   Transfers                 General transfer comment: deferred given L ankle/foot pain (reports gout)         ADL either performed or assessed with clinical judgement   ADL Overall ADL's : Needs  assistance/impaired Eating/Feeding: Set up;Sitting                                     General ADL Comments: session focus on seated activity and bil UE movement/stretching given bil shoulder pain (L>R)                        Cognition Arousal/Alertness: Awake/alert Behavior During Therapy: WFL for tasks assessed/performed Overall Cognitive Status: Within Functional Limits for tasks assessed                                          Exercises Exercises: General Upper Extremity;Other exercises General Exercises - Upper Extremity Shoulder Flexion: AAROM;Both;10 reps;Seated;Limitations Shoulder Flexion Limitations: RUE grossly to 90* (given cervical precautions); LUE grossly to 45*, more limited due to pain  Other Exercises Other Exercises: shoulder shrugs, AROM R, AAROM L, x10 Other Exercises: shoulder rolls, AROM RUE, AAROM LUE, x10 Other Exercises: lap slides RUE x10 AROM, x10 LUE AROM but with hold/stretch at end range  Other Exercises: educated in scapular retract and hold x3-5 seconds    Shoulder Instructions       General Comments      Pertinent Vitals/ Pain  Pain Assessment: Faces Faces Pain Scale: Hurts even more Pain Location: L upper trap, bil shoulders, L foot/ankle (gout) Pain Descriptors / Indicators: Discomfort;Grimacing;Sore Pain Intervention(s): Limited activity within patient's tolerance;Monitored during session;RN gave pain meds during session  Home Living                                          Prior Functioning/Environment              Frequency  Min 2X/week        Progress Toward Goals  OT Goals(current goals can now be found in the care plan section)  Progress towards OT goals: Progressing toward goals  Acute Rehab OT Goals Patient Stated Goal: to get rid of pain, to increase independence and mobility  OT Goal Formulation: With patient Time For Goal Achievement:  10/02/20 Potential to Achieve Goals: Good ADL Goals Pt Will Perform Eating: with set-up;sitting Pt Will Perform Grooming: with supervision;standing Pt Will Perform Lower Body Bathing: with supervision;sit to/from stand Pt Will Perform Upper Body Dressing: with set-up;sitting Pt Will Perform Lower Body Dressing: with set-up;sit to/from stand Additional ADL Goal #1: Pt will walk to bathroom with walker and complete all toileting on 3:1 over commode with supervision.  Plan Discharge plan remains appropriate    Co-evaluation                 AM-PAC OT "6 Clicks" Daily Activity     Outcome Measure   Help from another person eating meals?: A Little Help from another person taking care of personal grooming?: A Little Help from another person toileting, which includes using toliet, bedpan, or urinal?: A Little Help from another person bathing (including washing, rinsing, drying)?: A Lot Help from another person to put on and taking off regular upper body clothing?: A Lot Help from another person to put on and taking off regular lower body clothing?: A Lot 6 Click Score: 15    End of Session    OT Visit Diagnosis: Unsteadiness on feet (R26.81);Other symptoms and signs involving the nervous system (R29.898);Hemiplegia and hemiparesis;Pain Hemiplegia - Right/Left: Left Hemiplegia - dominant/non-dominant: Non-Dominant Hemiplegia - caused by: Unspecified Pain - Right/Left: Left Pain - part of body:  (shoulders bil, neck)   Activity Tolerance Patient tolerated treatment well;Patient limited by pain   Patient Left in chair;with call bell/phone within reach;with family/visitor present   Nurse Communication Mobility status        Time: 1594-5859 OT Time Calculation (min): 25 min  Charges: OT General Charges $OT Visit: 1 Visit OT Treatments $Therapeutic Activity: 23-37 mins  Lou Cal, OT Acute Rehabilitation Services Pager [phone] Office  [phone]     Raymondo Band 09/20/2020, 2:56 PM

## 2020-09-20 NOTE — Progress Notes (Signed)
PT Cancellation Note  Patient Details Name: [name] [name]. MRN: [id number] DOB: 1946-03-06   Cancelled Treatment:    Reason Eval/Treat Not Completed: Pain limiting ability to participate. Pt just received gout medicine as PT walked in the door. Pt reports "I can not walk at all because I can't tolerate any pressure on my L foot." Now that gout medicine is scheduled anticipate pt to be able to tolerate ambulation tomorrow. Pt requested PT to return tomorrow afternoon so he has 24 hours of scheduled gout medicine. Acute PT to return as able to progress mobility.  Kittie Plater, PT, DPT Acute Rehabilitation Services Pager #: [phone] Office #: [phone]    Berline Lopes 09/20/2020, 12:38 PM

## 2020-09-21 ENCOUNTER — Inpatient Hospital Stay (HOSPITAL_COMMUNITY)
Admission: RE | Admit: 2020-09-21 | Discharge: 2020-10-03 | DRG: 092 | Disposition: A | Discharge status: Home Health | Payer: Medicare HMO | Source: Intra-hospital | Attending: Physical Medicine and Rehabilitation | Admitting: Physical Medicine and Rehabilitation

## 2020-09-21 ENCOUNTER — Encounter (HOSPITAL_COMMUNITY): Payer: Self-pay | Admitting: Physical Medicine and Rehabilitation

## 2020-09-21 ENCOUNTER — Other Ambulatory Visit: Payer: Self-pay

## 2020-09-21 DIAGNOSIS — R490 Dysphonia: Secondary | ICD-10-CM | POA: Diagnosis present

## 2020-09-21 DIAGNOSIS — E785 Hyperlipidemia, unspecified: Secondary | ICD-10-CM | POA: Diagnosis present

## 2020-09-21 DIAGNOSIS — H538 Other visual disturbances: Secondary | ICD-10-CM | POA: Diagnosis not present

## 2020-09-21 DIAGNOSIS — R2689 Other abnormalities of gait and mobility: Principal | ICD-10-CM | POA: Diagnosis present

## 2020-09-21 DIAGNOSIS — Z888 Allergy status to other drugs, medicaments and biological substances status: Secondary | ICD-10-CM

## 2020-09-21 DIAGNOSIS — R339 Retention of urine, unspecified: Secondary | ICD-10-CM | POA: Diagnosis present

## 2020-09-21 DIAGNOSIS — Z981 Arthrodesis status: Secondary | ICD-10-CM | POA: Diagnosis not present

## 2020-09-21 DIAGNOSIS — R4586 Emotional lability: Secondary | ICD-10-CM | POA: Diagnosis not present

## 2020-09-21 DIAGNOSIS — G8194 Hemiplegia, unspecified affecting left nondominant side: Secondary | ICD-10-CM | POA: Diagnosis present

## 2020-09-21 DIAGNOSIS — K5900 Constipation, unspecified: Secondary | ICD-10-CM

## 2020-09-21 DIAGNOSIS — R252 Cramp and spasm: Secondary | ICD-10-CM | POA: Diagnosis not present

## 2020-09-21 DIAGNOSIS — I10 Essential (primary) hypertension: Secondary | ICD-10-CM | POA: Diagnosis present

## 2020-09-21 DIAGNOSIS — R29810 Facial weakness: Secondary | ICD-10-CM | POA: Diagnosis present

## 2020-09-21 DIAGNOSIS — N319 Neuromuscular dysfunction of bladder, unspecified: Secondary | ICD-10-CM | POA: Diagnosis present

## 2020-09-21 DIAGNOSIS — R42 Dizziness and giddiness: Secondary | ICD-10-CM | POA: Diagnosis present

## 2020-09-21 DIAGNOSIS — H5712 Ocular pain, left eye: Secondary | ICD-10-CM | POA: Diagnosis not present

## 2020-09-21 DIAGNOSIS — Z720 Tobacco use: Secondary | ICD-10-CM

## 2020-09-21 DIAGNOSIS — I252 Old myocardial infarction: Secondary | ICD-10-CM | POA: Diagnosis not present

## 2020-09-21 DIAGNOSIS — I2510 Atherosclerotic heart disease of native coronary artery without angina pectoris: Secondary | ICD-10-CM | POA: Diagnosis present

## 2020-09-21 DIAGNOSIS — M109 Gout, unspecified: Secondary | ICD-10-CM

## 2020-09-21 DIAGNOSIS — G729 Myopathy, unspecified: Secondary | ICD-10-CM

## 2020-09-21 DIAGNOSIS — M7918 Myalgia, other site: Secondary | ICD-10-CM | POA: Diagnosis not present

## 2020-09-21 DIAGNOSIS — E871 Hypo-osmolality and hyponatremia: Secondary | ICD-10-CM | POA: Diagnosis present

## 2020-09-21 DIAGNOSIS — F17200 Nicotine dependence, unspecified, uncomplicated: Secondary | ICD-10-CM

## 2020-09-21 DIAGNOSIS — Z7984 Long term (current) use of oral hypoglycemic drugs: Secondary | ICD-10-CM

## 2020-09-21 DIAGNOSIS — E1140 Type 2 diabetes mellitus with diabetic neuropathy, unspecified: Secondary | ICD-10-CM | POA: Diagnosis present

## 2020-09-21 DIAGNOSIS — G959 Disease of spinal cord, unspecified: Secondary | ICD-10-CM | POA: Diagnosis not present

## 2020-09-21 DIAGNOSIS — Z7982 Long term (current) use of aspirin: Secondary | ICD-10-CM

## 2020-09-21 DIAGNOSIS — R296 Repeated falls: Secondary | ICD-10-CM | POA: Diagnosis present

## 2020-09-21 DIAGNOSIS — Z79899 Other long term (current) drug therapy: Secondary | ICD-10-CM

## 2020-09-21 LAB — RENAL FUNCTION PANEL
Albumin: 3.1 g/dL — ABNORMAL LOW (ref 3.5–5.0)
Anion gap: 10 (ref 5–15)
BUN: 17 mg/dL (ref 8–23)
CO2: 24 mmol/L (ref 22–32)
Calcium: 8.6 mg/dL — ABNORMAL LOW (ref 8.9–10.3)
Chloride: 100 mmol/L (ref 98–111)
Creatinine, Ser: 0.83 mg/dL (ref 0.61–1.24)
GFR, Estimated: >60 mL/min (ref >60)
Glucose, Bld: 205 mg/dL — ABNORMAL HIGH (ref 70–99)
Phosphorus: 3.1 mg/dL (ref 2.5–4.6)
Potassium: 3.9 mmol/L (ref 3.5–5.1)
Sodium: 134 mmol/L — ABNORMAL LOW (ref 135–145)

## 2020-09-21 LAB — GLUCOSE, CAPILLARY
Glucose-Capillary: 183 mg/dL — ABNORMAL HIGH (ref 70–99)
Glucose-Capillary: 216 mg/dL — ABNORMAL HIGH (ref 70–99)
Glucose-Capillary: 228 mg/dL — ABNORMAL HIGH (ref 70–99)
Glucose-Capillary: 226 mg/dL — ABNORMAL HIGH (ref 70–99)

## 2020-09-21 MED ORDER — METFORMIN HCL 500 MG PO TABS
500.0000 mg | ORAL_TABLET | Freq: Two times a day (BID) | ORAL | Status: DC
  Administered 2020-09-22 – 2020-09-28 (×13): 500 mg via ORAL
  Filled 2020-09-21 (×13): qty 1

## 2020-09-21 MED ORDER — BISACODYL 10 MG RE SUPP: 10.0000 mg | Freq: Every day | RECTAL | Status: DC | PRN

## 2020-09-21 MED ORDER — LIDOCAINE HCL URETHRAL/MUCOSAL 2 % EX GEL: CUTANEOUS | Status: DC | PRN

## 2020-09-21 MED ORDER — ATORVASTATIN CALCIUM 10 MG PO TABS
20.0000 mg | ORAL_TABLET | Freq: Every day | ORAL | Status: DC
  Administered 2020-09-22 – 2020-10-02 (×11): 20 mg via ORAL
  Filled 2020-09-21 (×11): qty 2

## 2020-09-21 MED ORDER — POLYETHYLENE GLYCOL 3350 17 G PO PACK: 17.0000 g | PACK | Freq: Every day | ORAL | Status: DC | PRN

## 2020-09-21 MED ORDER — TRAZODONE HCL 50 MG PO TABS: 25.0000 mg | ORAL_TABLET | Freq: Every evening | ORAL | Status: DC | PRN

## 2020-09-21 MED ORDER — COLCHICINE 0.6 MG PO TABS
0.6000 mg | ORAL_TABLET | Freq: Two times a day (BID) | ORAL | Status: DC
  Administered 2020-09-22 – 2020-10-03 (×13): 0.6 mg via ORAL
  Filled 2020-09-21 (×21): qty 1

## 2020-09-21 MED ORDER — OXYCODONE HCL 5 MG PO TABS
5.0000 mg | ORAL_TABLET | ORAL | Status: DC | PRN
  Administered 2020-09-22 – 2020-09-29 (×11): 10 mg via ORAL
  Filled 2020-09-21 (×14): qty 2

## 2020-09-21 MED ORDER — PROCHLORPERAZINE EDISYLATE 10 MG/2ML IJ SOLN: 5.0000 mg | Freq: Four times a day (QID) | INTRAMUSCULAR | Status: DC | PRN

## 2020-09-21 MED ORDER — ALUM & MAG HYDROXIDE-SIMETH 200-200-20 MG/5ML PO SUSP: 30.0000 mL | ORAL | Status: DC | PRN

## 2020-09-21 MED ORDER — PANTOPRAZOLE SODIUM 40 MG PO TBEC
40.0000 mg | DELAYED_RELEASE_TABLET | Freq: Every day | ORAL | Status: DC
  Administered 2020-09-22 – 2020-10-03 (×12): 40 mg via ORAL
  Filled 2020-09-21 (×12): qty 1

## 2020-09-21 MED ORDER — CYCLOBENZAPRINE HCL 10 MG PO TABS: 10.0000 mg | ORAL_TABLET | Freq: Three times a day (TID) | ORAL | Status: DC | PRN

## 2020-09-21 MED ORDER — INSULIN ASPART 100 UNIT/ML ~~LOC~~ SOLN: 0.0000 [IU] | Freq: Every day | SUBCUTANEOUS | Status: DC

## 2020-09-21 MED ORDER — TAMSULOSIN HCL 0.4 MG PO CAPS
0.4000 mg | ORAL_CAPSULE | Freq: Every day | ORAL | Status: DC
  Administered 2020-09-22: 0.4 mg via ORAL
  Filled 2020-09-21: qty 1

## 2020-09-21 MED ORDER — PROCHLORPERAZINE MALEATE 5 MG PO TABS: 5.0000 mg | ORAL_TABLET | Freq: Four times a day (QID) | ORAL | Status: DC | PRN

## 2020-09-21 MED ORDER — DOCUSATE SODIUM 100 MG PO CAPS
100.0000 mg | ORAL_CAPSULE | Freq: Two times a day (BID) | ORAL | Status: DC
  Administered 2020-09-22 – 2020-09-26 (×9): 100 mg via ORAL
  Filled 2020-09-21 (×9): qty 1

## 2020-09-21 MED ORDER — PHENOL 1.4 % MT LIQD: 1.0000 | OROMUCOSAL | Status: DC | PRN

## 2020-09-21 MED ORDER — PROCHLORPERAZINE 25 MG RE SUPP: 12.5000 mg | Freq: Four times a day (QID) | RECTAL | Status: DC | PRN

## 2020-09-21 MED ORDER — DIPHENHYDRAMINE HCL 12.5 MG/5ML PO ELIX: 12.5000 mg | ORAL_SOLUTION | Freq: Four times a day (QID) | ORAL | Status: DC | PRN

## 2020-09-21 MED ORDER — METOPROLOL SUCCINATE ER 50 MG PO TB24
50.0000 mg | ORAL_TABLET | Freq: Every day | ORAL | Status: DC
  Administered 2020-09-22 – 2020-10-03 (×12): 50 mg via ORAL
  Filled 2020-09-21 (×12): qty 1

## 2020-09-21 MED ORDER — PROSOURCE PLUS PO LIQD
30.0000 mL | Freq: Two times a day (BID) | ORAL | Status: DC
  Administered 2020-09-22 – 2020-10-03 (×20): 30 mL via ORAL
  Filled 2020-09-21 (×21): qty 30

## 2020-09-21 MED ORDER — MENTHOL 3 MG MT LOZG: 1.0000 | LOZENGE | OROMUCOSAL | Status: DC | PRN

## 2020-09-21 MED ORDER — GABAPENTIN 300 MG PO CAPS
300.0000 mg | ORAL_CAPSULE | Freq: Three times a day (TID) | ORAL | Status: DC
  Administered 2020-09-22 – 2020-09-29 (×22): 300 mg via ORAL
  Filled 2020-09-21 (×22): qty 1

## 2020-09-21 MED ORDER — GUAIFENESIN-DM 100-10 MG/5ML PO SYRP: 5.0000 mL | ORAL_SOLUTION | Freq: Four times a day (QID) | ORAL | Status: DC | PRN

## 2020-09-21 MED ORDER — INSULIN ASPART 100 UNIT/ML ~~LOC~~ SOLN
0.0000 [IU] | Freq: Three times a day (TID) | SUBCUTANEOUS | Status: DC
  Administered 2020-09-22 (×2): 3 [IU] via SUBCUTANEOUS
  Administered 2020-09-22: 5 [IU] via SUBCUTANEOUS
  Administered 2020-09-23: 3 [IU] via SUBCUTANEOUS
  Administered 2020-09-23: 2 [IU] via SUBCUTANEOUS
  Administered 2020-09-23 – 2020-09-24 (×2): 3 [IU] via SUBCUTANEOUS
  Administered 2020-09-24: 5 [IU] via SUBCUTANEOUS
  Administered 2020-09-24: 2 [IU] via SUBCUTANEOUS
  Administered 2020-09-25 (×3): 3 [IU] via SUBCUTANEOUS
  Administered 2020-09-26: 2 [IU] via SUBCUTANEOUS
  Administered 2020-09-26: 3 [IU] via SUBCUTANEOUS
  Administered 2020-09-26: 5 [IU] via SUBCUTANEOUS
  Administered 2020-09-27: 2 [IU] via SUBCUTANEOUS
  Administered 2020-09-27: 3 [IU] via SUBCUTANEOUS
  Administered 2020-09-27 – 2020-09-28 (×3): 2 [IU] via SUBCUTANEOUS
  Administered 2020-09-28 – 2020-09-29 (×2): 3 [IU] via SUBCUTANEOUS

## 2020-09-21 MED ORDER — SENNOSIDES-DOCUSATE SODIUM 8.6-50 MG PO TABS
2.0000 | ORAL_TABLET | Freq: Every day | ORAL | Status: DC
  Administered 2020-09-22 – 2020-10-03 (×12): 2 via ORAL
  Filled 2020-09-21 (×12): qty 2

## 2020-09-21 MED ORDER — FLEET ENEMA 7-19 GM/118ML RE ENEM: 1.0000 | ENEMA | Freq: Once | RECTAL | Status: DC | PRN

## 2020-09-21 MED ORDER — PANTOPRAZOLE SODIUM 40 MG PO TBEC
40.0000 mg | DELAYED_RELEASE_TABLET | Freq: Every day | ORAL | Status: DC
Start: 2020-09-22 — End: 2020-09-21

## 2020-09-21 MED ORDER — ASPIRIN 325 MG PO TABS
325.0000 mg | ORAL_TABLET | Freq: Every day | ORAL | Status: DC
Start: 2020-09-24 — End: 2020-10-03

## 2020-09-21 MED ORDER — ACETAMINOPHEN 325 MG PO TABS
325.0000 mg | ORAL_TABLET | ORAL | Status: DC | PRN
  Administered 2020-09-22 – 2020-09-23 (×2): 650 mg via ORAL
  Filled 2020-09-21 (×2): qty 2

## 2020-09-21 MED ORDER — OXYCODONE HCL 10 MG PO TABS: 10.0000 mg | ORAL_TABLET | Freq: Four times a day (QID) | ORAL | 0 refills | Status: DC | PRN

## 2020-09-21 NOTE — H&P (Signed)
Physical Medicine and Rehabilitation Admission H&P    Chief Complaint  Patient presents with  . Functional deficits due to cervical myelopathy.     HPI: [name] [name] is a 74 year old male with history of CAD, T2DM, HTN, ACDF C3-C7 with cervical myelopathy with progressive left hemibody/hemifacial weakness and tingling, light headedness and presyncope with head turn to the left and  tendency to fall to the left since August 2021. Stroke ruled out and seen by neurology at [facility] who felt that symptoms likely C spine related. He was admitted on 09/15/20 after fall with weakness and inability to walk.  He was admitted 09/15/20 by Dr. Zada Finders for work up and MRI brain negative. MRI/MRA cervical spine showed ACDF C3-C7 with large osteophyte on left C5-C6 with severe left foraminal encroachment and spinal stenosis wind bilateral cord hyperintensity at C5-C6. CTA head/neck shwoed severe stenosis v/s occlusion at proximal R-VA with distal reconstitution as well as severe focal stenosis at origin of L-VA and approximate 50% stenosis of bilateral CAS.   Neurology consulted for input and felt B-VAS unlikely to cause symptoms.  Facial pain due to spinal nerve root impingement and surgical decompression recommended. Patient underwent C3-C7 posterior decompression on 11/07 by Dr. Zada Finders. Post op with significant neck pain but resolution of left facial numbness and improvement in LUE sensation. He did develop gout left foot affecting mobility and started on colchicine yesterday.   Therapy ongoing and CIR recommended due to functional deficits.   Pt reports gout is much better- not quite resolved.  Was numb on L side of face prior to surgery and lightheaded- somewhat better now. Also having posterior neck pain- pretty severe- also wound still bleeding so has pressure dressing.  Denies constipation, but LBM was last Friday- 6-7 days ago- Bladder- constantly leaking urine- thinks due to Flomax which is new-  wants ot stop it.  Pain currently 2/10 at rest.    Review of Systems  Constitutional: Negative for chills and fever.  HENT: Negative for hearing loss and tinnitus.   Eyes: Negative for blurred vision and double vision.  Respiratory: Negative for cough and shortness of breath.   Cardiovascular: Negative for chest pain and leg swelling.  Gastrointestinal: Positive for constipation. Negative for heartburn and nausea.  Genitourinary:       Has had difficulty urinating--peeing every hour last night.   Musculoskeletal: Positive for myalgias and neck pain.  Skin: Negative for rash.  Neurological: Positive for sensory change, focal weakness and weakness. Negative for speech change and headaches.  Psychiatric/Behavioral: The patient does not have insomnia.   All other systems reviewed and are negative.     Past Medical History:  Diagnosis Date  . CAD (coronary artery disease)   . Cancer ([place])   . Diabetes ([place])   . Dyslipidemia   . Gout   . History of kidney stones   . HTN (hypertension)   . Myocardial infarction [facility])     Past Surgical History:  Procedure Laterality Date  . ANTERIOR CERVICAL DECOMP/DISCECTOMY FUSION Left 10/20/2019   Procedure: Left Cervical Three-Four Cervical Four-Five Cervical Five-Six Anterior cervical decompression/discectomy/fusion;  Surgeon: Judith Part, MD;  Location: [place];  Service: Neurosurgery;  Laterality: Left;  Left Cervical Three-Four Cervical Four-Five Cervical Five-Six Anterior cervical decompression/discectomy/fusion  . BACK SURGERY    . left knee surgery    . left shoulder surgery    . POSTERIOR CERVICAL FUSION/FORAMINOTOMY N/A 09/17/2020   Procedure: CERVICAL THREE TO CERVICAL SIX  POSTERIOR CERVICAL DECOMPRESSION;  Surgeon: Judith Part, MD;  Location: [place];  Service: Neurosurgery;  Laterality: N/A;  . right knee surgery      Family History  Problem Relation Age of Onset  . Heart attack Father 72  . Colon cancer Brother 60     Social History:  Married. 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 He reports that he quit smoking about 31 years ago. His smoking use included cigarettes. He started smoking about 67 years ago. He has a 180.00 pack-year smoking history. He uses  smokeless tobacco--dips  snuff--one can per week.  He reports that he does not drink alcohol and does not use drugs.   Allergies  Allergen Reactions  . Simvastatin Other (See Comments)    dizziness    Medications Prior to Admission  Medication Sig Dispense Refill  . atorvastatin (LIPITOR) 20 MG tablet Take 1 tablet by mouth daily.    Marland Kitchen b complex vitamins tablet Take 1 tablet by mouth daily.    . colchicine 0.6 MG tablet Take 0.6 mg by mouth daily as needed (gout flare).    . gabapentin (NEURONTIN) 300 MG capsule Take 1 capsule by mouth in the morning, at noon, and at bedtime.     Marland Kitchen glipiZIDE (GLUCOTROL) 5 MG tablet Take 5 mg by mouth daily.     . metFORMIN (GLUCOPHAGE) 1000 MG tablet Take 1,000 mg by mouth daily.     . metoprolol succinate (TOPROL-XL) 50 MG 24 hr tablet Take 1 tablet (50 mg total) by mouth daily. Take with or immediately following a meal. (Patient taking differently: Take 50 mg by mouth daily. ) 90 tablet 1  . Omega-3 Fatty Acids (FISH OIL) 1000 MG CAPS Take 1,000 mg by mouth 2 (two) times daily.     Marland Kitchen omeprazole (PRILOSEC) 20 MG capsule Take 20 mg by mouth daily.   2  . tamsulosin (FLOMAX) 0.4 MG CAPS capsule Take 0.4 mg by mouth at bedtime.     . vitamin E 180 MG (400 UNITS) capsule Take 400 Units by mouth daily.    . [DISCONTINUED] aspirin 325 MG tablet Take 325 mg by mouth daily.    Marland Kitchen aspirin EC 81 MG tablet Take 1 tablet (81 mg total) by mouth daily. Restart on 10/28/2019 (Patient not taking: Reported on 09/15/2020) 30 tablet   . cyclobenzaprine (FLEXERIL) 10 MG tablet Take 1 tablet (10 mg total) by mouth 3 (three) times daily as needed for muscle spasms. (Patient not taking: Reported on 09/15/2020) 30  tablet 0  . LIFESCAN FINEPOINT LANCETS MISC Use to check blood sugar 3 time(s) daily      Drug Regimen Review  Drug regimen was reviewed and remains appropriate with no significant issues identified  Home: Home Living Family/patient expects to be discharged to:: Inpatient rehab Living Arrangements: Spouse/significant other Available Help at Discharge: Family, Available 24 hours/day Type of Home: House Home Access: Level entry Home Layout: One level Bathroom Shower/Tub: Multimedia programmer: Standard Home Equipment: Cane - single point, Environmental consultant - 2 wheels, Hand held shower head Additional Comments: does want to use w/c   Functional History: Prior Function Level of Independence: Independent with assistive device(s) Comments: 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 Mobilizing with cane until 3 weeks ago, since then has had multiple falls and used RW  Functional Status:  Mobility: Bed Mobility Overal bed mobility: Needs Assistance Bed Mobility: Supine to  Sit, Sit to Supine Supine to sit: Min assist, HOB elevated Sit to supine: Min assist General bed mobility comments: OOB in recliner upon arrival  Transfers Overall transfer level: Needs assistance Equipment used: Rolling walker (2 wheeled) Transfers: Sit to/from Stand Sit to Stand: Min assist, +2 physical assistance General transfer comment: deferred given L ankle/foot pain (reports gout)  Ambulation/Gait Ambulation/Gait assistance: Min assist, +2 physical assistance Gait Distance (Feet): 3 Feet Assistive device: Rolling walker (2 wheeled) Gait Pattern/deviations: Step-to pattern, Decreased stride length General Gait Details: Pt declined today due to gout pain in L foot and unable to compensate with UE cue to L shoulder pain.  Reports he has started his gout medicine and feels pain will be improved to morrow for ambulation.  States "I always get a gout flair after surgery." Gait  velocity: decreased Gait velocity interpretation: <1.31 ft/sec, indicative of household ambulator    ADL: ADL Overall ADL's : Needs assistance/impaired Eating/Feeding: Set up, Sitting Eating/Feeding Details (indicate cue type and reason): too much pain in neck to sit up too long without support on the neck.   Grooming: Wash/dry hands, Wash/dry face, Oral care, Minimal assistance, Sitting Grooming Details (indicate cue type and reason): occasional min assist due to LUE coordination deficits. Upper Body Bathing: Moderate assistance, Sitting Lower Body Bathing: Maximal assistance, Sit to/from stand, Cueing for compensatory techniques Lower Body Bathing Details (indicate cue type and reason): Pt unable to tolerate full bathing adl at this time due to pain but can do small sections with rest breaks. Upper Body Dressing : Moderate assistance, Sitting, Cueing for compensatory techniques Lower Body Dressing: Moderate assistance, Sit to/from stand, Cueing for compensatory techniques Toilet Transfer: Minimal assistance, +2 for safety/equipment, Stand-pivot, BSC Toilet Transfer Details (indicate cue type and reason): pt can pivot to [facility].  Declined walking to bathroom due to pain.  Pt with L lean when up. Toileting- Clothing Manipulation and Hygiene: Minimal assistance, Sit to/from stand, Cueing for compensatory techniques Functional mobility during ADLs: Minimal assistance, Rolling walker, Cueing for safety General ADL Comments: session focus on seated activity and bil UE movement/stretching given bil shoulder pain (L>R)   Cognition: Cognition Overall Cognitive Status: Within Functional Limits for tasks assessed Orientation Level: Oriented X4 Cognition Arousal/Alertness: Awake/alert Behavior During Therapy: WFL for tasks assessed/performed Overall Cognitive Status: Within Functional Limits for tasks assessed   Blood pressure 122/74, pulse 88, temperature (!) 97.5 F (36.4 C), resp. rate 18,  height 5\' 9"  (1.753 m), weight 99.2 kg, SpO2 97 %. Physical Exam Vitals and nursing note reviewed. Exam conducted with a chaperone present.  Constitutional:      Appearance: Normal appearance.     Comments: Obese male, NAD. Up in chair with fine tobacco residue on lips and shirt.  Son at bedside, sitting up in bedside chair, NAD  HENT:     Head: Normocephalic.     Comments: Staples in small area on R side of scalp- 5 staples right in a row Face symmetrical    Right Ear: External ear normal.     Left Ear: External ear normal.     Nose: Nose normal. No congestion or rhinorrhea.     Mouth/Throat:     Mouth: Mucous membranes are dry.     Pharynx: Oropharynx is clear. No oropharyngeal exudate.  Eyes:     General:        Right eye: No discharge.        Left eye: No discharge.     Extraocular Movements: Extraocular  movements intact.  Neck:     Comments: Posterior neck incision with dry dressing with bloody drainage noted.   Has new dressing on neck- pressure dressing noted- C/D/I.  Scalenes and levators very tight B/L Cardiovascular:     Rate and Rhythm: Normal rate and regular rhythm.     Heart sounds: Normal heart sounds. No murmur heard.  No gallop.   Pulmonary:     Comments: CTA B/L- no W/R/R- good air movement Abdominal:     General: Bowel sounds are increased. There is distension.     Comments: Protuberant, but also distended it appears- somewhat firm, NT, hypoactive BS  Musculoskeletal:     Comments: Well healed old left knee surgical scars. Left fore foot and great toe with mild erythema--minimal tenderness to touch.  UEs- at least 4/5 in UE's and LE's- due to neck incision, pt asked for no sig. Resistance to be used. Appears equal except L ankle from gout L ankle DF/PF 3-4/5- due to gout  Skin:    Comments: No skin breakdown on heels- since in chair, unable to assess backside- son says has none  B/L hand IV's- look ok  Neurological:     Mental Status: He is alert.      Comments: Mild left facial weakness with dysphonia noted. Left sided weakness with sensory deficits.     Psychiatric:     Comments: Appropriate- upset about something- not clear     Results for orders placed or performed during the hospital encounter of 09/15/20 (from the past 48 hour(s))  Glucose, capillary     Status: Abnormal   Collection Time: 09/19/20  4:39 PM  Result Value Ref Range   Glucose-Capillary 190 (H) 70 - 99 mg/dL    Comment: Glucose reference range applies only to samples taken after fasting for at least 8 hours.  Glucose, capillary     Status: Abnormal   Collection Time: 09/19/20  9:37 PM  Result Value Ref Range   Glucose-Capillary 186 (H) 70 - 99 mg/dL    Comment: Glucose reference range applies only to samples taken after fasting for at least 8 hours.  Renal function panel     Status: Abnormal   Collection Time: 09/20/20 12:28 AM  Result Value Ref Range   Sodium 133 (L) 135 - 145 mmol/L   Potassium 4.1 3.5 - 5.1 mmol/L   Chloride 99 98 - 111 mmol/L   CO2 25 22 - 32 mmol/L   Glucose, Bld 186 (H) 70 - 99 mg/dL    Comment: Glucose reference range applies only to samples taken after fasting for at least 8 hours.   BUN 16 8 - 23 mg/dL   Creatinine, Ser 0.85 0.61 - 1.24 mg/dL   Calcium 8.8 (L) 8.9 - 10.3 mg/dL   Phosphorus 3.3 2.5 - 4.6 mg/dL   Albumin 3.2 (L) 3.5 - 5.0 g/dL   GFR, Estimated >60 >60 mL/min    Comment: (NOTE) Calculated using the CKD-EPI Creatinine Equation (2021)    Anion gap 9 5 - 15    Comment: Performed at [facility] [address] [place], [place] [address]  Glucose, capillary     Status: Abnormal   Collection Time: 09/20/20  8:16 AM  Result Value Ref Range   Glucose-Capillary 176 (H) 70 - 99 mg/dL    Comment: Glucose reference range applies only to samples taken after fasting for at least 8 hours.  Glucose, capillary     Status: Abnormal   Collection  Time: 09/20/20 12:20 PM  Result Value Ref Range   Glucose-Capillary 230 (H)  70 - 99 mg/dL    Comment: Glucose reference range applies only to samples taken after fasting for at least 8 hours.  Glucose, capillary     Status: Abnormal   Collection Time: 09/20/20  4:16 PM  Result Value Ref Range   Glucose-Capillary 212 (H) 70 - 99 mg/dL    Comment: Glucose reference range applies only to samples taken after fasting for at least 8 hours.  Renal function panel     Status: Abnormal   Collection Time: 09/21/20  2:24 AM  Result Value Ref Range   Sodium 134 (L) 135 - 145 mmol/L   Potassium 3.9 3.5 - 5.1 mmol/L   Chloride 100 98 - 111 mmol/L   CO2 24 22 - 32 mmol/L   Glucose, Bld 205 (H) 70 - 99 mg/dL    Comment: Glucose reference range applies only to samples taken after fasting for at least 8 hours.   BUN 17 8 - 23 mg/dL   Creatinine, Ser 0.83 0.61 - 1.24 mg/dL   Calcium 8.6 (L) 8.9 - 10.3 mg/dL   Phosphorus 3.1 2.5 - 4.6 mg/dL   Albumin 3.1 (L) 3.5 - 5.0 g/dL   GFR, Estimated >60 >60 mL/min    Comment: (NOTE) Calculated using the CKD-EPI Creatinine Equation (2021)    Anion gap 10 5 - 15    Comment: Performed at [facility] [address], [place], [place] [address]  Glucose, capillary     Status: Abnormal   Collection Time: 09/21/20  7:59 AM  Result Value Ref Range   Glucose-Capillary 183 (H) 70 - 99 mg/dL    Comment: Glucose reference range applies only to samples taken after fasting for at least 8 hours.  Glucose, capillary     Status: Abnormal   Collection Time: 09/21/20 11:44 AM  Result Value Ref Range   Glucose-Capillary 216 (H) 70 - 99 mg/dL    Comment: Glucose reference range applies only to samples taken after fasting for at least 8 hours.   No results found.     Medical Problem List and Plan: 1.  Generalized weakness secondary to cervical myelopathy s/p C3-C5 cervical lami and decompression.   -patient may  Shower once doesn't need pressure dressing on posterior neck  -ELOS/Goals: 10-12 days- Supervision to mod I 2.   Antithrombotics: -DVT/anticoagulation:  Mechanical: Sequential compression devices, below knee Bilateral lower extremities  - will see, once bleeding stops from cervical incision, when can start Lovenox  -antiplatelet therapy: N/A 3. Pain Management: Continue Neurontin TID with Oxycodone prn 4. Mood: LCSW to follow for evaluation and support.   -antipsychotic agents: N/A 5. Neuropsych: This patient is capable of making decisions on his own behalf. 6. Skin/Wound Care: Routine pressure relief measures. Monitor wound for healing.  7. Fluids/Electrolytes/Nutrition: Monitor I/O. Appetite has been good. Check BMET in am.  8. HTN: Monitor BP tid--continue Toprol XL daily 9. Gout flare: Continue colchicine bid--symptoms improving. Don't think will need Steroids   10. T2DM: Will monitor BS ac/hs. BS poorly controlled--resume metformin. Continue to hold glucotrol.  Will use SSI for elevated BS.  11. Hyponatremia: Recheck labs in am.  12. Constipation: Will add Senna S. Might need sorbitol- no BM since last Friday.  13. Hesitancy: Related to constipation? Continue flomax at bedtime. Will order PVR checks.- pt feels constant urinary leaking IS from Flomax and wants it stopped, but likely for urinary retention-  need to check into this.  Augment bowel program as needed.      Bary Leriche, PA-C 09/21/2020     I have personally performed a face to face diagnostic evaluation of this patient and formulated the key components of the plan.  Additionally, I have personally reviewed laboratory data, imaging studies, as well as relevant notes and concur with the physician assistant's documentation above.   The patient's status has not changed from the original H&P.  Any changes in documentation from the acute care chart have been noted above.

## 2020-09-21 NOTE — Progress Notes (Signed)
  NEUROSURGERY PROGRESS NOTE   No issues overnight. Left foot feels much better today. Ambulating with PT with rolling walker.  EXAM:  BP 122/74 (BP Location: Left Arm)   Pulse 88   Temp (!) 97.5 F (36.4 C)   Resp 18   Ht 5\' 9"  (1.753 m)   Wt 99.2 kg   SpO2 97%   BMI 32.30 kg/m   Awake, alert, oriented  Speech fluent, appropriate  CN grossly intact  5/5 BUE/BLE  Wound c/d/i  IMPRESSION:  74 y.o. male POD# 4 s/p cervical lami, recovering well  PLAN: - d/c to CIR today

## 2020-09-21 NOTE — Progress Notes (Signed)
Inpatient Rehab Admissions Coordinator:   I have insurance authorization and a bed available for pt to admit today.  Dr. Kathyrn Sheriff in agreement.  Will let pt/family and TOC team know.  Shann Medal, PT, DPT Admissions Coordinator [phone] 09/21/20  1:17 PM

## 2020-09-21 NOTE — Discharge Summary (Signed)
Physician Discharge Summary  Patient ID: [name] [name]. MRN: [id number] DOB/AGE: September 07, 1946 74 y.o.  Admit date: 09/15/2020 Discharge date: 09/21/2020  Admission Diagnoses:  Cervical stenosis  Discharge Diagnoses:  Same Active Problems:   Cervical myopathy   Discharged Condition: Stable  Hospital Course:  Torion Hulgan. is a 74 y.o. male admitted after cervical laminectomy/foraminotomy for progressive left-sided weakness. His postoperative course was largely uneventful with a gout flareup treated with colchicine. 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 He was evaluated by PT/OT and found to be a good CIR candidate. He was discharged on POD# 5 in stable condition.  Treatments: Surgery - C3-C6 laminectomy/foraminotomy  Discharge Exam: Blood pressure 122/74, pulse 88, temperature (!) 97.5 F (36.4 C), resp. rate 18, height 5\' 9"  (1.753 m), weight 99.2 kg, SpO2 97 %. Awake, alert, oriented Speech fluent, appropriate CN grossly intact MAE well Wound c/d/i  Disposition: Discharge disposition: [place] Not Defined       Discharge Instructions    Call MD for:  redness, tenderness, or signs of infection (pain, swelling, redness, odor or green/yellow discharge around incision site)   Complete by: As directed    Call MD for:  temperature >100.4   Complete by: As directed    Diet - low sodium heart healthy   Complete by: As directed    Discharge instructions   Complete by: As directed    Walk at home as much as possible, at least 4 times / day   Increase activity slowly   Complete by: As directed    May shower / Bathe   Complete by: As directed    48 hours after surgery   May walk up steps   Complete by: As directed    No dressing needed   Complete by: As directed    Other Restrictions   Complete by: As directed    No bending/twisting at waist     Allergies as of 09/21/2020      Reactions    Simvastatin Other (See Comments)   dizziness      Medication List    STOP taking these medications   cyclobenzaprine 10 MG tablet Commonly known as: FLEXERIL     TAKE these medications   aspirin 325 MG tablet Take 1 tablet (325 mg total) by mouth daily. Start taking on: September 24, 2020 What changed:   These instructions start on September 24, 2020. If you are unsure what to do until then, ask your doctor or other care provider.  Another medication with the same name was removed. Continue taking this medication, and follow the directions you see here.   atorvastatin 20 MG tablet Commonly known as: LIPITOR Take 1 tablet by mouth daily.   b complex vitamins tablet Take 1 tablet by mouth daily.   colchicine 0.6 MG tablet Take 0.6 mg by mouth daily as needed (gout flare).   Fish Oil 1000 MG Caps Take 1,000 mg by mouth 2 (two) times daily.   gabapentin 300 MG capsule Commonly known as: NEURONTIN Take 1 capsule by mouth in the morning, at noon, and at bedtime.   glipiZIDE 5 MG tablet Commonly known as: GLUCOTROL Take 5 mg by mouth daily.   LIFESCAN FINEPOINT LANCETS Misc Use to check blood sugar 3 time(s) daily   metFORMIN 1000 MG tablet Commonly known as: GLUCOPHAGE Take 1,000 mg by mouth daily.   metoprolol succinate 50 MG 24 hr tablet Commonly known  as: TOPROL-XL Take 1 tablet (50 mg total) by mouth daily. Take with or immediately following a meal. What changed: additional instructions   omeprazole 20 MG capsule Commonly known as: PRILOSEC Take 20 mg by mouth daily.   Oxycodone HCl 10 MG Tabs Take 1 tablet (10 mg total) by mouth every 6 (six) hours as needed for severe pain ((score 7 to 10)).   tamsulosin 0.4 MG Caps capsule Commonly known as: FLOMAX Take 0.4 mg by mouth at bedtime.   vitamin E 180 MG (400 UNITS) capsule Take 400 Units by mouth daily.            Discharge Care Instructions  (From admission, onward)         Start      Ordered   09/21/20 0000  No dressing needed        09/21/20 1301          Follow-up Information    Judith Part, MD. Call in 2 week(s).   Specialty: Neurosurgery Contact information: [place] [address] [phone]               Signed: Jairo Ben 09/21/2020, 1:02 PM

## 2020-09-22 ENCOUNTER — Inpatient Hospital Stay (HOSPITAL_COMMUNITY): Payer: Medicare HMO | Admitting: Occupational Therapy

## 2020-09-22 ENCOUNTER — Encounter (HOSPITAL_COMMUNITY): Payer: Self-pay | Admitting: Physical Medicine and Rehabilitation

## 2020-09-22 ENCOUNTER — Inpatient Hospital Stay (HOSPITAL_COMMUNITY): Payer: Medicare HMO

## 2020-09-22 ENCOUNTER — Inpatient Hospital Stay (HOSPITAL_COMMUNITY): Payer: Medicare HMO | Admitting: Physical Therapy

## 2020-09-22 DIAGNOSIS — G959 Disease of spinal cord, unspecified: Secondary | ICD-10-CM

## 2020-09-22 LAB — CBC WITH DIFFERENTIAL/PLATELET
Abs Immature Granulocytes: 0.03 10*3/uL (ref 0.00–0.07)
Basophils Absolute: 0 10*3/uL (ref 0.0–0.1)
Basophils Relative: 0 %
Eosinophils Absolute: 0.2 10*3/uL (ref 0.0–0.5)
Eosinophils Relative: 2 %
HCT: 32 % — ABNORMAL LOW (ref 39.0–52.0)
Hemoglobin: 10.5 g/dL — ABNORMAL LOW (ref 13.0–17.0)
Immature Granulocytes: 0 %
Lymphocytes Relative: 41 %
Lymphs Abs: 3.6 10*3/uL (ref 0.7–4.0)
MCH: 28.5 pg (ref 26.0–34.0)
MCHC: 32.8 g/dL (ref 30.0–36.0)
MCV: 87 fL (ref 80.0–100.0)
Monocytes Absolute: 0.8 10*3/uL (ref 0.1–1.0)
Monocytes Relative: 9 %
Neutro Abs: 4.1 10*3/uL (ref 1.7–7.7)
Neutrophils Relative %: 48 %
Platelets: 232 10*3/uL (ref 150–400)
RBC: 3.68 MIL/uL — ABNORMAL LOW (ref 4.22–5.81)
RDW: 13.7 % (ref 11.5–15.5)
WBC: 8.7 10*3/uL (ref 4.0–10.5)
nRBC: 0 % (ref 0.0–0.2)

## 2020-09-22 LAB — GLUCOSE, CAPILLARY
Glucose-Capillary: 208 mg/dL — ABNORMAL HIGH (ref 70–99)
Glucose-Capillary: 200 mg/dL — ABNORMAL HIGH (ref 70–99)
Glucose-Capillary: 167 mg/dL — ABNORMAL HIGH (ref 70–99)
Glucose-Capillary: 188 mg/dL — ABNORMAL HIGH (ref 70–99)

## 2020-09-22 MED ORDER — LIDOCAINE HCL URETHRAL/MUCOSAL 2 % EX GEL
CUTANEOUS | Status: DC | PRN
  Filled 2020-09-22 (×4): qty 5

## 2020-09-22 MED ORDER — CYCLOBENZAPRINE HCL 10 MG PO TABS
10.0000 mg | ORAL_TABLET | Freq: Three times a day (TID) | ORAL | Status: DC
  Administered 2020-09-22 – 2020-09-23 (×4): 10 mg via ORAL
  Filled 2020-09-22 (×4): qty 1

## 2020-09-22 NOTE — Evaluation (Signed)
Occupational Therapy Assessment and Plan  Patient Details  Name: [name] [name]. MRN: [id number] Date of Birth: [birth year]  OT Diagnosis: acute pain, muscle weakness (generalized) and coordination disorder Rehab Potential: Rehab Potential (ACUTE ONLY): Good ELOS: 12-14 days   Today's Date: 09/22/2020 OT Individual Time: 2202-5427 OT Individual Time Calculation (min): 60 min     Hospital Problem: Active Problems:   Cervical myelopathy [facility])   Past Medical History:  Past Medical History:  Diagnosis Date  . CAD (coronary artery disease)   . Cancer ([place])   . Diabetes ([place])   . Dyslipidemia   . Gout   . History of kidney stones   . HTN (hypertension)   . Myocardial infarction [facility])    Past Surgical History:  Past Surgical History:  Procedure Laterality Date  . ANTERIOR CERVICAL DECOMP/DISCECTOMY FUSION Left 10/20/2019   Procedure: Left Cervical Three-Four Cervical Four-Five Cervical Five-Six Anterior cervical decompression/discectomy/fusion;  Surgeon: Judith Part, MD;  Location: [place];  Service: Neurosurgery;  Laterality: Left;  Left Cervical Three-Four Cervical Four-Five Cervical Five-Six Anterior cervical decompression/discectomy/fusion  . BACK SURGERY    . left knee surgery    . left shoulder surgery    . POSTERIOR CERVICAL FUSION/FORAMINOTOMY N/A 09/17/2020   Procedure: CERVICAL THREE TO CERVICAL SIX POSTERIOR CERVICAL DECOMPRESSION;  Surgeon: Judith Part, MD;  Location: [place];  Service: Neurosurgery;  Laterality: N/A;  . right knee surgery      Assessment & Plan Clinical Impression: [name] [name] is a 74 year old male with history of CAD, T2DM, HTN, ACDF C3-C7 with cervical myelopathy with progressive left hemibody/hemifacial weakness and tingling, light headedness and presyncope with head turn to the left and  tendency to fall to the left since August 2021. Stroke ruled out and seen by neurology at [facility] who felt that symptoms likely C spine related.  He was admitted on 09/15/20 after fall with weakness and inability to walk.  He was admitted 09/15/20 by Dr. Zada Finders for work up and MRI brain negative. MRI/MRA cervical spine showed ACDF C3-C7 with large osteophyte on left C5-C6 with severe left foraminal encroachment and spinal stenosis wind bilateral cord hyperintensity at C5-C6. CTA head/neck shwoed severe stenosis v/s occlusion at proximal R-VA with distal reconstitution as well as severe focal stenosis at origin of L-VA and approximate 50% stenosis of bilateral CAS.   Neurology consulted for input and felt B-VAS unlikely to cause symptoms.  Facial pain due to spinal nerve root impingement and surgical decompression recommended. Patient underwent C3-C7 posterior decompression on 11/07 by Dr. Zada Finders. Post op with significant neck pain but resolution of left facial numbness and improvement in LUE sensation. He did develop gout left foot affecting mobility and started on colchicine yesterday.   Therapy ongoing and CIR recommended due to functional deficits.    Patient currently requires max with basic self-care skills secondary to muscle weakness, decreased cardiorespiratoy endurance and decreased standing balance, decreased postural control, difficulty maintaining precautions and pain.  Prior to hospitalization, patient could complete BADLs with modified independent .  Patient will benefit from skilled intervention to increase independence with basic self-care skills prior to discharge home with wife.  Anticipate patient will require 24 hour supervision and follow up home health.  OT - End of Session Endurance Deficit: Yes Endurance Deficit Description: Pt requiring several rest breaks during self care tasks due to fatigue + pain OT Assessment Rehab Potential (ACUTE ONLY): Good OT Barriers to Discharge: Other (comments) (uncontrolled pain) OT Patient  demonstrates impairments in the following area(s): Balance;Safety;Endurance;Motor;Pain OT  Basic ADL's Functional Problem(s): Grooming;Bathing;Dressing;Toileting;Eating OT Advanced ADL's Functional Problem(s): Simple Meal Preparation OT Transfers Functional Problem(s): Toilet;Tub/Shower OT Additional Impairment(s): Fuctional Use of Upper Extremity OT Plan OT Intensity: Minimum of 1-2 x/day, 45 to 90 minutes OT Frequency: 5 out of 7 days OT Duration/Estimated Length of Stay: 12-14 days OT Treatment/Interventions: Balance/vestibular training;Discharge planning;Pain management;UE/LE Coordination activities;Therapeutic Activities;Self Care/advanced ADL retraining;Disease mangement/prevention;Functional mobility training;Patient/family education;Therapeutic Exercise;Community reintegration;DME/adaptive equipment instruction;Neuromuscular re-education;Psychosocial support;UE/LE Strength taining/ROM;Wheelchair propulsion/positioning OT Self Feeding Anticipated Outcome(s): Supervision OT Basic Self-Care Anticipated Outcome(s): Supervision OT Toileting Anticipated Outcome(s): Supervision OT Bathroom Transfers Anticipated Outcome(s): Supervision OT Recommendation Recommendations for Other Services: Therapeutic Recreation consult Therapeutic Recreation Interventions: Stress management;Other (comment) (holistic pain mgt) Patient destination: Home Follow Up Recommendations: Home health OT Equipment Recommended: To be determined   OT Evaluation Precautions/Restrictions  Precautions Precautions: Fall;Cervical;Other (comment) Precaution Comments: very painful neck with all movement; has vertebral artery stenosis Restrictions Other Position/Activity Restrictions: no brace needed General   Vital Signs Therapy Vitals Temp: 97.8 F (36.6 C) Pulse Rate: 76 Resp: 19 BP: 120/74 Patient Position (if appropriate): Lying Oxygen Therapy SpO2: 99 % O2 Device: Room Air Pain Pain Assessment Pain Score: 7  Pain Type: Surgical pain Pain Location: Neck Pain Orientation: Mid;Posterior Pain  Descriptors / Indicators: Grimacing;Discomfort;Radiating Pain Onset: With Activity Pain Intervention(s): Distraction;Rest;Ambulation/increased activity Home Living/Prior Functioning Home Living Family/patient expects to be discharged to:: Private residence Living Arrangements: Spouse/significant other Available Help at Discharge: Family, Available 24 hours/day Type of Home: House Home Access: Level entry, Ramped entrance Home Layout: One level Bathroom Shower/Tub: Multimedia programmer: Standard  Lives With: Spouse (spouse Nature conservation officer) IADL History Homemaking Responsibilities: No Occupation: Full time employment Type of Occupation: truck Geophysicist/field seismologist Leisure and Hobbies: Oncologist Prior Function Level of Independence: Independent with basic ADLs, Independent with homemaking with ambulation (prior to functional decline ~2 weeks ago, pt used his cane for mobility and was Mod I with ADLs, after decline he needed increased assistance from wife for self care and began using the RW)  Able to Take Stairs?: Yes (would have someone stand on his L side for safety) Driving: Yes Comments: 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 Mobilizing with cane until 3 weeks ago, since then has had multiple falls and used RW stating he got to the point where he couldn't stand up and walk Vision Baseline Vision/History: Wears glasses Wears Glasses: At all times Patient Visual Report: No change from baseline Perception  Perception: Within Functional Limits Praxis Praxis: Intact Cognition Overall Cognitive Status: Within Functional Limits for tasks assessed Arousal/Alertness: Awake/alert Orientation Level: Person;Place;Situation Person: Oriented Place: Oriented Situation: Oriented Year: 2021 Month: November Day of Week: Correct Immediate Memory Recall: Sock;Blue;Bed Memory Recall Sock: With Cue Memory Recall  Blue: With Cue Memory Recall Bed: With Cue Attention: Focused;Sustained Focused Attention: Appears intact Sustained Attention: Appears intact Safety/Judgment: Appears intact Sensation Sensation Light Touch: Appears Intact (bilateral UEs) Proprioception: Appears Intact (bilateral UEs) Coordination Gross Motor Movements are Fluid and Coordinated: No Fine Motor Movements are Fluid and Coordinated: No Coordination and Movement Description: Guarded due to pain, tremulous activity in Rt>Lt UE Finger Nose Finger Test: Mild dysmetria on the Rt, unable to complete on the Lt due to shoulder weakness Motor  Motor Motor: Other (comment) Motor - Skilled Clinical Observations: guarded due to pain, shoulder weakness bilaterally with hx of RTC  repair on the Lt and tramatic work related surgical repair on the Rt  Trunk/Postural Assessment  Cervical Assessment Cervical Assessment: Exceptions to [facility] (cervical precautions) Thoracic Assessment Thoracic Assessment: Within Functional Limits Lumbar Assessment Lumbar Assessment: Within Functional Limits Postural Control Postural Control: Deficits on evaluation (limited in standing)  Balance Balance Balance Assessed: Yes Dynamic Sitting Balance Dynamic Sitting - Level of Assistance: 5: Stand by assistance (attempting to thread Lt LE into pants while sitting unsupported) Dynamic Standing Balance Dynamic Standing - Level of Assistance: 4: Min assist (stand pivot transfer to bed using RW) Extremity/Trunk Assessment RUE Assessment RUE Assessment:  (Hx traumatic work related injury requiring surgical repair in 2019; assessment limited by pain, pt able to wash his face but only able to tolerate ~90 degrees shoulder flexion for deoderent application, limited forearm supination) LUE Assessment LUE Assessment:  (assessment limited by pain; hx of 2 RTC repairs in 1995) Active Range of Motion (AROM) Comments: ~45 degrees shoulder flexion, limited supination  abilities, heavy reliance on the sink for meeting task demands Lt>Rt UE  Care Tool Care Tool Self Care    Oral Care  Oral care, brush teeth, clean dentures activity did not occur: Refused (due to pain)      Bathing   Body parts bathed by patient: Face;Front perineal area;Right upper leg;Left upper leg Body parts bathed by helper: Right arm;Left arm;Chest;Abdomen;Buttocks;Right lower leg;Left lower leg   Assist Level: Maximal Assistance - Patient 24 - 49%    Upper Body Dressing(including orthotics)   What is the patient wearing?: Hospital gown only   Assist Level: Maximal Assistance - Patient 25 - 49%    Lower Body Dressing (excluding footwear)   What is the patient wearing?: Underwear/pull up Assist for lower body dressing: Maximal Assistance - Patient 25 - 49%    Putting on/Taking off footwear   What is the patient wearing?: Non-skid slipper socks Assist for footwear: Total Assistance - Patient < 25%       Care Tool Toileting Toileting activity Toileting Activity did not occur (Clothing management and hygiene only): N/A (no void or bm) Assist for toileting: Moderate Assistance - Patient 50 - 74% (standing) Assistive Device Comment: urinal   Care Tool Bed Mobility Roll left and right activity   Roll left and right assist level: Supervision/Verbal cueing    Sit to lying activity        Lying to sitting edge of bed activity   Lying to sitting edge of bed assist level: Minimal Assistance - Patient > 75%     Care Tool Transfers Sit to stand transfer   Sit to stand assist level: Moderate Assistance - Patient 50 - 74%    Chair/bed transfer   Chair/bed transfer assist level: Moderate Assistance - Patient 50 - 74%     Toilet transfer Toilet transfer activity did not occur: Refused (pt refusing to attempt due to pain, requesting to return to bed)         Refer to Care Plan for Long Term Goals  SHORT TERM GOAL WEEK 1 OT Short Term Goal 1 (Week 1): Pt will complete a  toilet transfer with 1 assist and LRAD OT Short Term Goal 2 (Week 1): Pt will complete UB dressing with Mod A OT Short Term Goal 3 (Week 1): Pt will complete 1/3 components of donning pants with supervision assist  Recommendations for other services: Therapeutic Recreation  Stress management and Other holistic pain mgt   Skilled Therapeutic Intervention Skilled OT session completed with  focus on initial evaluation, education on OT role/POC, and establishment of patient-centered goals.   Pt greeted in the w/c, reclined with an ice pack for his neck, reporting so much pain that it felt like "baseballs" were rolling down his spine. RN in to provide pain medicine at start of session. He then engaged in bathing/dressing tasks sit<stand at the sink. Pt was very limited functionally due to pain, only able to wash face, frontal periarea, and upper thighs. Pain when raising his arms so that OT could wash both axillas. Max A for UB/LB self care. Min A for sit<stand at the sink, pt unable to assist with pulling underwear over hips due to pain, was able to briefly sit up unsupported in the w/c to try to thread LEs into pants beforehand, pt needing A to thread both. Min A for stand pivot<bed using the RW with vcs for hand placement and CGA for transitioning back to supine. Noted some tremulous activity in the UEs at times, Rt>Lt. Pt reports that he gets "the shakes." Pt remained in bed at close of session, left him with all needs within reach and bed alarm set.   ADL ADL Eating: Not assessed Grooming: Supervision/safety Where Assessed-Grooming: Sitting at sink Upper Body Bathing: Maximal assistance Where Assessed-Upper Body Bathing: Sitting at sink Lower Body Bathing: Maximal assistance Where Assessed-Lower Body Bathing: Sitting at sink;Standing at sink Upper Body Dressing: Maximal assistance Where Assessed-Upper Body Dressing: Sitting at sink Lower Body Dressing: Maximal assistance Where Assessed-Lower  Body Dressing: Sitting at sink;Standing at sink Toileting: Not assessed Toilet Transfer: Not assessed Tub/Shower Transfer: Not assessed ADL Comments: Functional assessment profoundly limited by pain Mobility   Toilet transfer unassessed due to pain and pt opting to return to bed vs simulate this transfer   Discharge Criteria: Patient will be discharged from OT if patient refuses treatment 3 consecutive times without medical reason, if treatment goals not met, if there is a change in medical status, if patient makes no progress towards goals or if patient is discharged from hospital.  The above assessment, treatment plan, treatment alternatives and goals were discussed and mutually agreed upon: by patient  Skeet Simmer 09/22/2020, 3:37 PM

## 2020-09-22 NOTE — Progress Notes (Signed)
Inpatient Rehabilitation  Patient information reviewed and entered into eRehab system by Jacobus Colvin M. Kaidan Spengler, M.A., CCC/SLP, PPS Coordinator.  Information including medical coding, functional ability and quality indicators will be reviewed and updated through discharge.    

## 2020-09-22 NOTE — Evaluation (Signed)
Physical Therapy Assessment and Plan  Patient Details  Name: [name] [name]. MRN: [id number] Date of Birth: November 04, 1946  PT Diagnosis: Abnormal posture, Abnormality of gait, Difficulty walking, Hemiparesis non-dominant, Muscle weakness and Pain in cervical spine Rehab Potential: Good ELOS: 1.5-2 weeks   Today's Date: 09/22/2020 PT Individual Time: 0923-3007 PT Individual Time Calculation (min): 62 min    Hospital Problem: Active Problems:   Cervical myelopathy [facility])   Past Medical History:  Past Medical History:  Diagnosis Date  . CAD (coronary artery disease)   . Cancer ([place])   . Diabetes ([place])   . Dyslipidemia   . Gout   . History of kidney stones   . HTN (hypertension)   . Myocardial infarction [facility])    Past Surgical History:  Past Surgical History:  Procedure Laterality Date  . ANTERIOR CERVICAL DECOMP/DISCECTOMY FUSION Left 10/20/2019   Procedure: Left Cervical Three-Four Cervical Four-Five Cervical Five-Six Anterior cervical decompression/discectomy/fusion;  Surgeon: Judith Part, MD;  Location: [place];  Service: Neurosurgery;  Laterality: Left;  Left Cervical Three-Four Cervical Four-Five Cervical Five-Six Anterior cervical decompression/discectomy/fusion  . BACK SURGERY    . left knee surgery    . left shoulder surgery    . POSTERIOR CERVICAL FUSION/FORAMINOTOMY N/A 09/17/2020   Procedure: CERVICAL THREE TO CERVICAL SIX POSTERIOR CERVICAL DECOMPRESSION;  Surgeon: Judith Part, MD;  Location: [place];  Service: Neurosurgery;  Laterality: N/A;  . right knee surgery      Assessment & Plan Clinical Impression: Patient is a 74 y.o. year old male with history of CAD, T2DM, HTN, ACDF C3-C7 with cervical myelopathy withprogressive left hemibody/hemifacial weaknessand tingling, light headedness and presyncope with head turn to the left and tendency to fall to the left since August 2021. Stroke ruled out and seen by neurology at [facility] who felt that symptoms  likely C spine related. He was admitted on 09/15/20 afterfall with weakness and inability to walk.He was admitted 09/15/20 by Dr. Zada Finders for work up and MRI brain negative. MRI/MRA cervical spine showed ACDF C3-C7 with large osteophyte on left C5-C6 with severe left foraminal encroachment and spinal stenosis wind bilateral cord hyperintensity at C5-C6. CTA head/neck shwoed severe stenosis v/s occlusion at proximal R-VA with distal reconstitution as well as severe focal stenosis at origin of L-VA and approximate 50% stenosis of bilateral CAS.   Neurology consulted for input and felt B-VAS unlikely to cause symptoms. Facial pain due to spinal nerve root impingement and surgical decompression recommended. Patient underwent C3-C7 posterior decompression on 11/07 by Dr. Zada Finders. Post op with significant neck pain but resolution of left facial numbness and improvement in LUE sensation. He did develop gout left foot affecting mobility and started on colchicine yesterday. Therapy ongoing and CIR recommended due to functional deficits. Patient transferred to CIR on 09/21/2020 .   Patient currently requires mod assist with mobility secondary to muscle weakness and muscle joint tightness, decreased cardiorespiratoy endurance, unbalanced muscle activation,   and decreased sitting balance, decreased standing balance, decreased postural control and decreased balance strategies.  Prior to hospitalization, patient was modified independent  with mobility and lived with Spouse (spouse Arville Go) in a House home.  Home access is  Level entry, Ramped entrance.  Patient will benefit from skilled PT intervention to maximize safe functional mobility, minimize fall risk and decrease caregiver burden for planned discharge home with 24 hour supervision.  Anticipate patient will benefit from follow up [place] at discharge.  PT - End of Session Activity Tolerance: Tolerates 30+  min activity with multiple rests Endurance Deficit:  Yes Endurance Deficit Description: Requires severel rest breaks due to pain and fatigue PT Assessment Rehab Potential (ACUTE/IP ONLY): Good PT Barriers to Discharge: Wound Care PT Patient demonstrates impairments in the following area(s): Balance;Safety;Behavior;Edema;Skin Integrity;Endurance;Motor;Nutrition;Pain;Perception;Sensory PT Transfers Functional Problem(s): Bed Mobility;Bed to Chair;Car;Furniture;Floor PT Locomotion Functional Problem(s): Ambulation;Stairs;Wheelchair Mobility PT Plan PT Intensity: Minimum of 1-2 x/day ,45 to 90 minutes PT Frequency: 5 out of 7 days PT Duration Estimated Length of Stay: 1.5-2 weeks PT Treatment/Interventions: Ambulation/gait training;Community reintegration;DME/adaptive equipment instruction;Neuromuscular re-education;Psychosocial support;Stair training;UE/LE Strength taining/ROM;Wheelchair propulsion/positioning;Balance/vestibular training;Discharge planning;Functional electrical stimulation;Pain management;Skin care/wound management;Therapeutic Activities;UE/LE Coordination activities;Cognitive remediation/compensation;Disease management/prevention;Functional mobility training;Patient/family education;Splinting/orthotics;Therapeutic Exercise;Visual/perceptual remediation/compensation PT Transfers Anticipated Outcome(s): supervision PT Locomotion Anticipated Outcome(s): supervision PT Recommendation Follow Up Recommendations: Home health PT;24 hour supervision/assistance Patient destination: Home Equipment Recommended: To be determined   PT Evaluation Precautions/Restrictions Precautions Precautions: Fall;Cervical;Other (comment) Precaution Comments: very painful neck with all movement; has vertebral artery stenosis Required Braces or Orthoses:  (per orders, no brace needed) Restrictions Weight Bearing Restrictions: No Pain Pain Assessment Pain Scale: 0-10 Pain Score: 10-Worst pain ever (no pain at rest but 10/10 when initiating OOB  mobility) Pain Type: Acute pain;Surgical pain Pain Location: Neck Pain Orientation: Posterior;Left;Right Pain Descriptors / Indicators: Spasm;Other (Comment) ("knotting up on my left side with a little spot on the right") Pain Onset: With Activity Pain Intervention(s): Rest;Relaxation;Repositioned;Emotional support;Medication (See eMAR) Home Living/Prior Functioning Home Living Available Help at Discharge: Family;Available 24 hours/day Type of Home: House Home Access: Level entry;Ramped entrance Home Layout: One level  Lives With: Spouse (wife, Arville Go) Prior Function Level of Independence: Independent with homemaking with ambulation;Independent with transfers;Requires assistive device for independence;Independent with gait (used SPC)  Able to Take Stairs?: Yes (would have someone stand on his L side for safety) Driving: Yes Comments: 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 Mobilizing with cane until 3 weeks ago, since then has had multiple falls and used RW stating he got to the point where he couldn't stand up and walk Perception  Perception Perception: Within Functional Limits Praxis Praxis: Intact  Cognition Overall Cognitive Status: Within Functional Limits for tasks assessed Arousal/Alertness: Awake/alert Orientation Level: Oriented X4 Attention: Focused;Sustained Focused Attention: Appears intact Sustained Attention: Appears intact Safety/Judgment: Appears intact Sensation Sensation Light Touch: Appears Intact Hot/Cold: Not tested Proprioception: Appears Intact Stereognosis: Not tested Coordination Gross Motor Movements are Fluid and Coordinated: No Coordination and Movement Description: guarded movements due to pain Motor  Motor Motor: Other (comment) Motor - Skilled Clinical Observations: guarded movements due to pain, L LE weakness compared to R   Trunk/Postural Assessment   Cervical Assessment Cervical Assessment: Exceptions to [facility] (cervical precautions w/ pt noted to hold his neck very stiff/still due to fear of pain) Thoracic Assessment Thoracic Assessment: Exceptions to [facility] (scapular protraction with some increased thoracic rounding) Lumbar Assessment Lumbar Assessment: Exceptions to [facility] (posterior pelvic tilt) Postural Control Postural Control: Deficits on evaluation Postural Limitations: decreased with B UE support on RW during standing  Balance Balance Balance Assessed: Yes Static Sitting Balance Static Sitting - Balance Support: Feet supported Static Sitting - Level of Assistance: 5: Stand by assistance Dynamic Sitting Balance Dynamic Sitting - Level of Assistance: 5: Stand by assistance;4: Min assist Static Standing Balance Static Standing - Balance Support: Bilateral upper extremity supported Static Standing - Level of Assistance: 4: Min assist Dynamic Standing Balance Dynamic Standing - Balance  Support: Bilateral upper extremity supported Dynamic Standing - Level of Assistance: 4: Min assist;3: Mod assist Extremity Assessment      RLE Assessment RLE Assessment: Within Functional Limits Active Range of Motion (AROM) Comments: WNL General Strength Comments: 5/5 assessed in supine LLE Assessment LLE Assessment: Exceptions to [facility] Active Range of Motion (AROM) Comments: WNL LLE Strength Left Hip Flexion: 3+/5 Left Knee Flexion: 4-/5 Left Knee Extension: 4/5 Left Ankle Dorsiflexion: 4-/5 Left Ankle Plantar Flexion: 4-/5  Care Tool Care Tool Bed Mobility Roll left and right activity   Roll left and right assist level: Minimal Assistance - Patient > 75%    Sit to lying activity   Sit to lying assist level: Moderate Assistance - Patient 50 - 74%    Lying to sitting edge of bed activity   Lying to sitting edge of bed assist level: Moderate Assistance - Patient 50 - 74%     Care Tool Transfers Sit to stand transfer   Sit to stand  assist level: Minimal Assistance - Patient > 75%    Chair/bed transfer   Chair/bed transfer assist level: Minimal Assistance - Patient > 75% Chair/bed transfer assistive device: Engineering geologist transfer activity did not occur: Safety/medical concerns (pain)        Care Tool Locomotion Ambulation   Assist level: Minimal Assistance - Patient > 75% Assistive device: Walker-rolling Max distance: 26f  Walk 10 feet activity   Assist level: Minimal Assistance - Patient > 75% Assistive device: Walker-rolling   Walk 50 feet with 2 turns activity Walk 50 feet with 2 turns activity did not occur: Safety/medical concerns      Walk 150 feet activity Walk 150 feet activity did not occur: Safety/medical concerns      Walk 10 feet on uneven surfaces activity Walk 10 feet on uneven surfaces activity did not occur: Safety/medical concerns      Stairs Stair activity did not occur: Safety/medical concerns        Walk up/down 1 step activity Walk up/down 1 step or curb (drop down) activity did not occur: Safety/medical concerns     Walk up/down 4 steps activity did not occuR: Safety/medical concerns  Walk up/down 4 steps activity      Walk up/down 12 steps activity Walk up/down 12 steps activity did not occur: Safety/medical concerns      Pick up small objects from floor Pick up small object from the floor (from standing position) activity did not occur: Safety/medical concerns      Wheelchair Will patient use wheelchair at discharge?:  (TBD but do not anticipate)          Wheel 50 feet with 2 turns activity      Wheel 150 feet activity        Refer to Care Plan for Long Term Goals  SHORT TERM GOAL WEEK 1 PT Short Term Goal 1 (Week 1): Pt will perform supine<>sit with min assist using same features as will have at home PT Short Term Goal 2 (Week 1): Pt will perform sit<>stand using LRAD with CGA PT Short Term Goal 3 (Week 1): Pt will perform  stand pivot transfers using LRAD with CGA PT Short Term Goal 4 (Week 1): Pt will ambulate at least 785fusing LRAD with CGA  Recommendations for other services: None   Skilled Therapeutic Intervention Evaluation completed (see details above) with patient education regarding purpose of PT evaluation, PT POC  and goals, therapy schedule, weekly team meetings, and other CIR information including safety plan and fall risk safety. Pt received supine in bed with HOB elevated and pt noted to be holding neck very still/stiff avoiding rotating head to look at therapist due to fear of pain. Pt agreeable to therapy session. MD in/out for morning assessment and RN in/out for medication administration. Pt reports 0/10 pain at rest but that it increases to 10/10 during movement stating it is so severe it "throws him back in the bed." Pt performed the below mobility tasks with therapist providing cuing for proper technique/sequencing to increase pt independence. Therapist provided pt with reclining w/c to allow increased cervical spine support and repositioning features to allow increased upright, OOB activity tolerance. Pt demos very guarded movements throughout session due to increased pain with mobility tasks (most notably when transitioning from supine>sitting EOB). At end of session pt left sitting in w/c with needs in reach, seat belt alarm on, and pt reporting comfort in that position at this time.  Mobility Bed Mobility Bed Mobility: Supine to Sit;Sit to Supine Supine to Sit: Moderate Assistance - Patient 50-74% (cuing for logroll technique) Sit to Supine: Moderate Assistance - Patient 50-74% Transfers Transfers: Sit to Stand;Stand to Sit;Stand Pivot Transfers Sit to Stand: Minimal Assistance - Patient > 75% Stand to Sit: Minimal Assistance - Patient > 75% Stand Pivot Transfers: Minimal Assistance - Patient > 75% Stand Pivot Transfer Details: Tactile cues for sequencing;Tactile cues for weight  shifting;Tactile cues for placement;Verbal cues for sequencing;Verbal cues for gait pattern;Verbal cues for technique;Visual cues/gestures for sequencing;Verbal cues for safe use of DME/AE Transfer (Assistive device): Rolling walker Locomotion  Gait Ambulation: Yes Gait Assistance: Minimal Assistance - Patient > 75% Gait Distance (Feet): 22 Feet Assistive device: Rolling walker Gait Assistance Details: Tactile cues for sequencing;Tactile cues for weight shifting;Verbal cues for sequencing;Verbal cues for gait pattern;Manual facilitation for weight shifting;Verbal cues for safe use of DME/AE;Verbal cues for technique;Tactile cues for weight beaing Gait Gait: Yes Gait Pattern: Impaired Gait Pattern: Decreased step length - right;Decreased step length - left;Decreased stride length;Narrow base of support (left lean) Stairs / Additional Locomotion Stairs: No Wheelchair Mobility Wheelchair Mobility: No   Discharge Criteria: Patient will be discharged from PT if patient refuses treatment 3 consecutive times without medical reason, if treatment goals not met, if there is a change in medical status, if patient makes no progress towards goals or if patient is discharged from hospital.  The above assessment, treatment plan, treatment alternatives and goals were discussed and mutually agreed upon: by patient  Tawana Scale , PT, DPT, CSRS  09/22/2020, 7:50 AM

## 2020-09-22 NOTE — Plan of Care (Signed)
  Problem: RH Balance Goal: LTG Patient will maintain dynamic standing with ADLs (OT) Description: LTG:  Patient will maintain dynamic standing balance with assist during activities of daily living (OT)  Flowsheets (Taken 09/22/2020 1543) LTG: Pt will maintain dynamic standing balance during ADLs with: Supervision/Verbal cueing   Problem: Sit to Stand Goal: LTG:  Patient will perform sit to stand in prep for activites of daily living with assistance level (OT) Description: LTG:  Patient will perform sit to stand in prep for activites of daily living with assistance level (OT) Flowsheets (Taken 09/22/2020 1543) LTG: PT will perform sit to stand in prep for activites of daily living with assistance level: Supervision/Verbal cueing   Problem: RH Eating Goal: LTG Patient will perform eating w/assist, cues/equip (OT) Description: LTG: Patient will perform eating with assist, with/without cues using equipment (OT) Flowsheets (Taken 09/22/2020 1543) LTG: Pt will perform eating with assistance level of: Supervision/Verbal cueing   Problem: RH Bathing Goal: LTG Patient will bathe all body parts with assist levels (OT) Description: LTG: Patient will bathe all body parts with assist levels (OT) Flowsheets (Taken 09/22/2020 1543) LTG: Pt will perform bathing with assistance level/cueing: Supervision/Verbal cueing   Problem: RH Dressing Goal: LTG Patient will perform upper body dressing (OT) Description: LTG Patient will perform upper body dressing with assist, with/without cues (OT). Flowsheets (Taken 09/22/2020 1543) LTG: Pt will perform upper body dressing with assistance level of: Supervision/Verbal cueing Goal: LTG Patient will perform lower body dressing w/assist (OT) Description: LTG: Patient will perform lower body dressing with assist, with/without cues in positioning using equipment (OT) Flowsheets (Taken 09/22/2020 1543) LTG: Pt will perform lower body dressing with assistance level of:  Supervision/Verbal cueing   Problem: RH Toileting Goal: LTG Patient will perform toileting task (3/3 steps) with assistance level (OT) Description: LTG: Patient will perform toileting task (3/3 steps) with assistance level (OT)  Flowsheets (Taken 09/22/2020 1543) LTG: Pt will perform toileting task (3/3 steps) with assistance level: Supervision/Verbal cueing   Problem: RH Toilet Transfers Goal: LTG Patient will perform toilet transfers w/assist (OT) Description: LTG: Patient will perform toilet transfers with assist, with/without cues using equipment (OT) Flowsheets (Taken 09/22/2020 1543) LTG: Pt will perform toilet transfers with assistance level of: Supervision/Verbal cueing   Problem: RH Tub/Shower Transfers Goal: LTG Patient will perform tub/shower transfers w/assist (OT) Description: LTG: Patient will perform tub/shower transfers with assist, with/without cues using equipment (OT) Flowsheets (Taken 09/22/2020 1543) LTG: Pt will perform tub/shower stall transfers with assistance level of: Supervision/Verbal cueing

## 2020-09-22 NOTE — Progress Notes (Signed)
PMR Admission Coordinator Pre-Admission Assessment  Patient: [name] V Norville Jr. is an 74 y.o., male MRN: [id number] DOB: 03/08/1946 Height: 5' 9" (175.3 cm) Weight: 99.2 kg  Insurance Information HMO:     PPO:      PCP:      IPA:      80/20:      OTHER:  PRIMARY: [employer]      Policy#: [id number]      Subscriber: pt CM Name: Heather      Phone#: n/a     Fax#: [phone] Pre-Cert#: [id number] auth for CIR provided by Heather with [employer] with updates due to Monique Middlebrook (ph [phone]) at fax listed above on 11/16.        Employer:  Benefits:  Phone #: [phone]     Name:  Eff. Date: 11/12/19     Deduct: $0      Out of Pocket Max: $5000  ($1132.03 met)      Life Max: n/a CIR: $295/day for days 1-6      SNF: 20 full days Outpatient:      Co-Pay: $35/visit Home Health: 100%      Co-Pay:  DME: 80%     Co-Pay: 20% Providers:  SECONDARY:       Policy#:      Phone#:   Financial Counselor:       Phone#:   The "Data Collection Information Summary" for patients in Inpatient Rehabilitation Facilities with attached "Privacy Act Statement-Health Care Records" was provided and verbally reviewed with: Patient and Family  Emergency Contact Information Contact Information    Name Relation Home Work Mobile   Arauz,Joanne Spouse [phone]     Bracamonte,Joni Daughter   [phone]   Manuel,Kenneth Son   [phone]      Current Medical History  Patient Admitting Diagnosis: cervical myelopathy  History of Present Illness: Pt is a 74 y/o male with PMH of CAD, DM, gout, HTN, and MI who presents to [facility] at the request of his neurosurgeon on 11/5 with c/o worsening weakness and falls following a ? CVA in August 2021 (imaging did not confirm).  ED exam showed numbness to L face, LUE, LLE and weakness in LUE/LLE compared to R.  Per Dr. Ostergard, pt had been having syncopal episodes in the past, and felt weakness was worsening over the last few weeks.  He does endorse  pre-syncopal symptoms when rotating head to the L and combined with his history of vascular disease, was concerning for Bow Hunter's syndrome.  Requested MRI brain/Cspine, CTA with head turn left.  Also consulted Neurology.  MRI brain was negative.  MRI cspine showed multilevel spinal and foraminal stenosis due to spurring as well as a large osteophyte on the L at C5-6 with several spinal encroachment at the same level.  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  Pt underwent C3-C6 laminectomy and bilateral forminotomies per Dr. Ostergard on 11/7.  Post op course pain management, management of gout flare-up, and DM management.  Therapy evaluations were completed and pt was recommended for CIR.     Patient's medical record from [facility] has been reviewed by the rehabilitation admission coordinator and physician.  Past Medical History  Past Medical History:  Diagnosis Date  . CAD (coronary artery disease)   . Cancer (HCC)   . Diabetes (HCC)   . Dyslipidemia   . Gout   . History   of kidney stones   . HTN (hypertension)   . Myocardial infarction (HCC)     Family History   family history includes Colon cancer (age of onset: 56) in his brother; Heart attack (age of onset: 90) in his father.  Prior Rehab/Hospitalizations Has the patient had prior rehab or hospitalizations prior to admission? Yes  Has the patient had major surgery during 100 days prior to admission? Yes   Current Medications  Current Facility-Administered Medications:  .  0.9 %  sodium chloride infusion, , Intravenous, Continuous, Ostergard, Thomas A, MD, Last Rate: 75 mL/hr at 09/17/20 0001, New Bag at 09/17/20 0001 .  acetaminophen (TYLENOL) tablet 650 mg, 650 mg, Oral, Q4H PRN, 650 mg at 09/17/20 1408 **OR** acetaminophen (TYLENOL) suppository 650 mg, 650 mg, Rectal, Q4H PRN, Ostergard, Thomas A, MD .  atorvastatin (LIPITOR)  tablet 20 mg, 20 mg, Oral, QHS, Ostergard, Thomas A, MD, 20 mg at 09/20/20 2116 .  colchicine tablet 0.6 mg, 0.6 mg, Oral, BID PRN, Nundkumar, Neelesh, MD, 0.6 mg at 09/19/20 2203 .  colchicine tablet 0.6 mg, 0.6 mg, Oral, BID, Nundkumar, Neelesh, MD, 0.6 mg at 09/21/20 0825 .  cyclobenzaprine (FLEXERIL) tablet 10 mg, 10 mg, Oral, TID PRN, Ostergard, Thomas A, MD, 10 mg at 09/21/20 0825 .  docusate sodium (COLACE) capsule 100 mg, 100 mg, Oral, BID, Ostergard, Thomas A, MD, 100 mg at 09/21/20 0825 .  gabapentin (NEURONTIN) capsule 300 mg, 300 mg, Oral, TID, Ostergard, Thomas A, MD, 300 mg at 09/21/20 0825 .  HYDROmorphone (DILAUDID) injection 1 mg, 1 mg, Intravenous, Q3H PRN, Ostergard, Thomas A, MD, 1 mg at 09/18/20 0730 .  insulin aspart (novoLOG) injection 0-15 Units, 0-15 Units, Subcutaneous, TID WC, Ostergard, Thomas A, MD, 5 Units at 09/20/20 1226 .  insulin aspart (novoLOG) injection 0-5 Units, 0-5 Units, Subcutaneous, QHS, Ostergard, Thomas A, MD .  menthol-cetylpyridinium (CEPACOL) lozenge 3 mg, 1 lozenge, Oral, PRN **OR** phenol (CHLORASEPTIC) mouth spray 1 spray, 1 spray, Mouth/Throat, PRN, Ostergard, Thomas A, MD .  metoprolol succinate (TOPROL-XL) 24 hr tablet 50 mg, 50 mg, Oral, Daily, Ostergard, Thomas A, MD, 50 mg at 09/21/20 0824 .  ondansetron (ZOFRAN) tablet 4 mg, 4 mg, Oral, Q6H PRN **OR** ondansetron (ZOFRAN) injection 4 mg, 4 mg, Intravenous, Q6H PRN, Ostergard, Thomas A, MD .  oxyCODONE (Oxy IR/ROXICODONE) immediate release tablet 10 mg, 10 mg, Oral, Q4H PRN, Ostergard, Thomas A, MD, 10 mg at 09/21/20 0825 .  oxyCODONE (Oxy IR/ROXICODONE) immediate release tablet 5 mg, 5 mg, Oral, Q4H PRN, Ostergard, Thomas A, MD .  pantoprazole (PROTONIX) EC tablet 40 mg, 40 mg, Oral, Daily, Ostergard, Thomas A, MD, 40 mg at 09/21/20 0824 .  polyethylene glycol (MIRALAX / GLYCOLAX) packet 17 g, 17 g, Oral, Daily PRN, Ostergard, Thomas A, MD .  tamsulosin (FLOMAX) capsule 0.4 mg, 0.4 mg, Oral,  QHS, Ostergard, Thomas A, MD, 0.4 mg at 09/20/20 2116  Patients Current Diet:  Diet Order            Diet Carb Modified Fluid consistency: Thin; Room service appropriate? Yes  Diet effective now                 Precautions / Restrictions Precautions Precautions: Fall, Cervical Precaution Comments: very painful neck with all movement; has vertebral artery stenosis Restrictions Weight Bearing Restrictions: No   Has the patient had 2 or more falls or a fall with injury in the past year? Yes  Prior Activity Level Limited Community (1-2x/wk): had been   going out less since Covid started in 2020.  Not mobilizing as much since then.  Using DME for mobility due to pain, using cane then RW  Prior Functional Level Self Care: Did the patient need help bathing, dressing, using the toilet or eating? Independent  Indoor Mobility: Did the patient need assistance with walking from room to room (with or without device)? Independent  Stairs: Did the patient need assistance with internal or external stairs (with or without device)? Independent  Functional Cognition: Did the patient need help planning regular tasks such as shopping or remembering to take medications? Independent  Home Assistive Devices / Equipment Home Assistive Devices/Equipment: Cane (specify quad or straight) Home Equipment: Cane - single point, Walker - 2 wheels, Hand held shower head  Prior Device Use: Indicate devices/aids used by the patient prior to current illness, exacerbation or injury? Walker and cane  Current Functional Level Cognition  Overall Cognitive Status: Within Functional Limits for tasks assessed Orientation Level: Oriented X4    Extremity Assessment (includes Sensation/Coordination)  Upper Extremity Assessment: Defer to OT evaluation LUE Deficits / Details: ROM limited to 90 degrees in L shoulder due to pain an old rotator cuff injury.  Strength:  shoulder 2/5, biceps 4/5, triceps 3+/5, wrist  extension 3+/5, grip 3/5, finger extension 2+/5. LUE: Unable to fully assess due to pain LUE Sensation: decreased light touch LUE Coordination: decreased fine motor, decreased gross motor  Lower Extremity Assessment: Overall WFL for tasks assessed (pt denies difference in sensation bilaterall)    ADLs  Overall ADL's : Needs assistance/impaired Eating/Feeding: Set up, Sitting Eating/Feeding Details (indicate cue type and reason): too much pain in neck to sit up too long without support on the neck.   Grooming: Wash/dry hands, Wash/dry face, Oral care, Minimal assistance, Sitting Grooming Details (indicate cue type and reason): occasional min assist due to LUE coordination deficits. Upper Body Bathing: Moderate assistance, Sitting Lower Body Bathing: Maximal assistance, Sit to/from stand, Cueing for compensatory techniques Lower Body Bathing Details (indicate cue type and reason): Pt unable to tolerate full bathing adl at this time due to pain but can do small sections with rest breaks. Upper Body Dressing : Moderate assistance, Sitting, Cueing for compensatory techniques Lower Body Dressing: Moderate assistance, Sit to/from stand, Cueing for compensatory techniques Toilet Transfer: Minimal assistance, +2 for safety/equipment, Stand-pivot, BSC Toilet Transfer Details (indicate cue type and reason): pt can pivot to BSC.  Declined walking to bathroom due to pain.  Pt with L lean when up. Toileting- Clothing Manipulation and Hygiene: Minimal assistance, Sit to/from stand, Cueing for compensatory techniques Functional mobility during ADLs: Minimal assistance, Rolling walker, Cueing for safety General ADL Comments: session focus on seated activity and bil UE movement/stretching given bil shoulder pain (L>R)     Mobility  Overal bed mobility: Needs Assistance Bed Mobility: Supine to Sit, Sit to Supine Supine to sit: Min assist, HOB elevated Sit to supine: Min assist General bed mobility comments:  OOB in recliner upon arrival     Transfers  Overall transfer level: Needs assistance Equipment used: Rolling walker (2 wheeled) Transfers: Sit to/from Stand Sit to Stand: Min assist, +2 physical assistance General transfer comment: deferred given L ankle/foot pain (reports gout)     Ambulation / Gait / Stairs / Wheelchair Mobility  Ambulation/Gait Ambulation/Gait assistance: Min assist, +2 physical assistance Gait Distance (Feet): 3 Feet Assistive device: Rolling walker (2 wheeled) Gait Pattern/deviations: Step-to pattern, Decreased stride length General Gait Details: Pt declined today due to gout pain   in L foot and unable to compensate with UE cue to L shoulder pain.  Reports he has started his gout medicine and feels pain will be improved to morrow for ambulation.  States "I always get a gout flair after surgery." Gait velocity: decreased Gait velocity interpretation: <1.31 ft/sec, indicative of household ambulator    Posture / Balance Dynamic Sitting Balance Sitting balance - Comments: Pt with L lean but could correct with cues. Balance Overall balance assessment: Needs assistance, History of Falls Sitting-balance support: Feet supported, No upper extremity supported Sitting balance-Leahy Scale: Fair Sitting balance - Comments: Pt with L lean but could correct with cues. Postural control: Left lateral lean Standing balance support: During functional activity, Bilateral upper extremity supported Standing balance-Leahy Scale: Poor Standing balance comment: Used RW and min guard of 2 for static stance.  Had pt weight shift side to side and when coming to the left pt occaionally required mod A.    Special needs/care consideration Skin surgical incision to posterior neck and Diabetic management yes   Previous Home Environment (from acute therapy documentation) Living Arrangements: Spouse/significant other Available Help at Discharge: Family, Available 24 hours/day Type of Home:  House Home Layout: One level Home Access: Level entry Bathroom Shower/Tub: Walk-in shower Bathroom Toilet: Standard Home Care Services: No Additional Comments: does want to use w/c  Discharge Living Setting Plans for Discharge Living Setting: Patient's home Type of Home at Discharge: House Discharge Home Layout: One level Discharge Home Access: Level entry Discharge Bathroom Shower/Tub: Walk-in shower Discharge Bathroom Toilet: Standard Discharge Bathroom Accessibility: Yes How Accessible: Accessible via walker Does the patient have any problems obtaining your medications?: No  Social/Family/Support Systems Patient Roles: Spouse Contact Information: Joanne Billingham [phone] Anticipated Caregiver: Kenneth (son), Joni (dtr) Anticipated Caregiver's Contact Information: Kenneth [phone]; Joni [phone] Ability/Limitations of Caregiver: spouse can only provide supervision, Kenneth and Joni can provide up to min assist Caregiver Availability: 24/7 Discharge Plan Discussed with Primary Caregiver: Yes Is Caregiver In Agreement with Plan?: Yes Does Caregiver/Family have Issues with Lodging/Transportation while Pt is in Rehab?: No  Goals Patient/Family Goal for Rehab: PT/OT supervisio to mod I, no SLP Expected length of stay: 10-12 days Additional Information: gout flare on 11/8 Pt/Family Agrees to Admission and willing to participate: Yes Program Orientation Provided & Reviewed with Pt/Caregiver Including Roles  & Responsibilities: Yes  Barriers to Discharge: Insurance for SNF coverage  Decrease burden of Care through IP rehab admission: n/a  Possible need for SNF placement upon discharge: Not anticipated  Patient Condition: I have reviewed medical records from [facility], spoken with CM, and patient and son. I met with patient at the bedside for inpatient rehabilitation assessment.  Patient will benefit from ongoing PT and OT, can actively participate in 3 hours of  therapy a day 5 days of the week, and can make measurable gains during the admission.  Patient will also benefit from the coordinated team approach during an Inpatient Acute Rehabilitation admission.  The patient will receive intensive therapy as well as Rehabilitation physician, nursing, social worker, and care management interventions.  Due to safety, skin/wound care, disease management, medication administration, pain management and patient education the patient requires 24 hour a day rehabilitation nursing.  The patient is currently mod assist with mobility and basic ADLs.  Discharge setting and therapy post discharge at home with home health is anticipated.  Patient has agreed to participate in the Acute Inpatient Rehabilitation Program and will admit today.  Preadmission Screen Completed By:    Rehabilitation physician, nursing, social worker, and care management interventions.  Due to safety, skin/wound care, disease management, medication administration, pain management and patient education the patient requires 24 hour a day rehabilitation nursing.  The patient is currently mod assist with mobility and basic ADLs.  Discharge setting and therapy post discharge at home with home health is anticipated.  Patient has agreed to participate in the Acute Inpatient Rehabilitation Program and will admit today.   Preadmission Screen Completed By:  Michel Santee, PT, DPT 09/21/2020 9:55 AM ______________________________________________________________________   Discussed status with Dr. Dagoberto Ligas on 09/21/20  at 9:55 AM  and received approval for admission today.   Admission Coordinator:  Michel Santee, PT, DPT time 9:55 AM Sudie Grumbling 09/21/20     Assessment/Plan: Diagnosis: 1. Does the need for close, 24 hr/day Medical supervision in concert with the patient's rehab needs make it unreasonable for this patient to be served in a less intensive setting? Yes 2. 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 Due to bladder management, bowel management, safety, skin/wound care, disease management, medication administration, pain management and patient education, does the patient require 24 hr/day rehab nursing? Yes 4. Does the patient require coordinated care of a physician, rehab nurse, PT, OT, and SLP to address physical  and functional deficits in the context of the above medical diagnosis(es)? Yes Addressing deficits in the following areas: balance, endurance, locomotion, strength, transferring, bowel/bladder control, bathing, dressing, feeding, grooming and toileting 5. Can the patient actively participate in an intensive therapy program of at least 3 hrs of therapy 5 days a week? Yes 6. The potential for patient to make measurable gains while on inpatient rehab is good 7. Anticipated functional outcomes upon discharge from inpatient rehab: modified independent and supervision PT, modified independent and supervision OT, n/a SLP 8. Estimated rehab length of stay to reach the above functional goals is: 10-12 days 9. Anticipated discharge destination: Home 10. Overall Rehab/Functional Prognosis: good     MD Signature:

## 2020-09-22 NOTE — H&P (Signed)
Physical Medicine and Rehabilitation Admission H&P        Chief Complaint  Patient presents with  . Functional deficits due to cervical myelopathy.       HPI: [name] [name] is a 74 year old male with history of CAD, T2DM, HTN, ACDF C3-C7 with cervical myelopathy with progressive left hemibody/hemifacial weakness and tingling, light headedness and presyncope with head turn to the left and  tendency to fall to the left since August 2021. Stroke ruled out and seen by neurology at [facility] who felt that symptoms likely C spine related. He was admitted on 09/15/20 after fall with weakness and inability to walk.  He was admitted 09/15/20 by Dr. Zada Finders for work up and MRI brain negative. MRI/MRA cervical spine showed ACDF C3-C7 with large osteophyte on left C5-C6 with severe left foraminal encroachment and spinal stenosis wind bilateral cord hyperintensity at C5-C6. CTA head/neck shwoed severe stenosis v/s occlusion at proximal R-VA with distal reconstitution as well as severe focal stenosis at origin of L-VA and approximate 50% stenosis of bilateral CAS.    Neurology consulted for input and felt B-VAS unlikely to cause symptoms.  Facial pain due to spinal nerve root impingement and surgical decompression recommended. Patient underwent C3-C7 posterior decompression on 11/07 by Dr. Zada Finders. Post op with significant neck pain but resolution of left facial numbness and improvement in LUE sensation. He did develop gout left foot affecting mobility and started on colchicine yesterday.   Therapy ongoing and CIR recommended due to functional deficits.    Pt reports gout is much better- not quite resolved.  Was numb on L side of face prior to surgery and lightheaded- somewhat better now. Also having posterior neck pain- pretty severe- also wound still bleeding so has pressure dressing.  Denies constipation, but LBM was last Friday- 6-7 days ago- Bladder- constantly leaking urine- thinks due to Flomax  which is new- wants ot stop it.  Pain currently 2/10 at rest.      Review of Systems  Constitutional: Negative for chills and fever.  HENT: Negative for hearing loss and tinnitus.   Eyes: Negative for blurred vision and double vision.  Respiratory: Negative for cough and shortness of breath.   Cardiovascular: Negative for chest pain and leg swelling.  Gastrointestinal: Positive for constipation. Negative for heartburn and nausea.  Genitourinary:       Has had difficulty urinating--peeing every hour last night.   Musculoskeletal: Positive for myalgias and neck pain.  Skin: Negative for rash.  Neurological: Positive for sensory change, focal weakness and weakness. Negative for speech change and headaches.  Psychiatric/Behavioral: The patient does not have insomnia.   All other systems reviewed and are negative.           Past Medical History:  Diagnosis Date  . CAD (coronary artery disease)    . Cancer ([place])    . Diabetes ([place])    . Dyslipidemia    . Gout    . History of kidney stones    . HTN (hypertension)    . Myocardial infarction [facility])             Past Surgical History:  Procedure Laterality Date  . ANTERIOR CERVICAL DECOMP/DISCECTOMY FUSION Left 10/20/2019    Procedure: Left Cervical Three-Four Cervical Four-Five Cervical Five-Six Anterior cervical decompression/discectomy/fusion;  Surgeon: Judith Part, MD;  Location: [place];  Service: Neurosurgery;  Laterality: Left;  Left Cervical Three-Four Cervical Four-Five Cervical Five-Six Anterior cervical decompression/discectomy/fusion  .  BACK SURGERY      . left knee surgery      . left shoulder surgery      . POSTERIOR CERVICAL FUSION/FORAMINOTOMY N/A 09/17/2020    Procedure: CERVICAL THREE TO CERVICAL SIX POSTERIOR CERVICAL DECOMPRESSION;  Surgeon: Judith Part, MD;  Location: [place];  Service: Neurosurgery;  Laterality: N/A;  . right knee surgery               Family History  Problem Relation Age of Onset    . Heart attack Father 2  . Colon cancer Brother 18      Social History:  Married. 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 He reports that he quit smoking about 31 years ago. His smoking use included cigarettes. He started smoking about 67 years ago. He has a 180.00 pack-year smoking history. He uses  smokeless tobacco--dips  snuff--one can per week.  He reports that he does not drink alcohol and does not use drugs.          Allergies  Allergen Reactions  . Simvastatin Other (See Comments)      dizziness            Medications Prior to Admission  Medication Sig Dispense Refill  . atorvastatin (LIPITOR) 20 MG tablet Take 1 tablet by mouth daily.      Marland Kitchen b complex vitamins tablet Take 1 tablet by mouth daily.      . colchicine 0.6 MG tablet Take 0.6 mg by mouth daily as needed (gout flare).      . gabapentin (NEURONTIN) 300 MG capsule Take 1 capsule by mouth in the morning, at noon, and at bedtime.       Marland Kitchen glipiZIDE (GLUCOTROL) 5 MG tablet Take 5 mg by mouth daily.       . metFORMIN (GLUCOPHAGE) 1000 MG tablet Take 1,000 mg by mouth daily.       . metoprolol succinate (TOPROL-XL) 50 MG 24 hr tablet Take 1 tablet (50 mg total) by mouth daily. Take with or immediately following a meal. (Patient taking differently: Take 50 mg by mouth daily. ) 90 tablet 1  . Omega-3 Fatty Acids (FISH OIL) 1000 MG CAPS Take 1,000 mg by mouth 2 (two) times daily.       Marland Kitchen omeprazole (PRILOSEC) 20 MG capsule Take 20 mg by mouth daily.    2  . tamsulosin (FLOMAX) 0.4 MG CAPS capsule Take 0.4 mg by mouth at bedtime.       . vitamin E 180 MG (400 UNITS) capsule Take 400 Units by mouth daily.      . [DISCONTINUED] aspirin 325 MG tablet Take 325 mg by mouth daily.      Marland Kitchen aspirin EC 81 MG tablet Take 1 tablet (81 mg total) by mouth daily. Restart on 10/28/2019 (Patient not taking: Reported on 09/15/2020) 30 tablet    . cyclobenzaprine (FLEXERIL) 10 MG tablet Take 1 tablet (10 mg total) by mouth 3  (three) times daily as needed for muscle spasms. (Patient not taking: Reported on 09/15/2020) 30 tablet 0  . LIFESCAN FINEPOINT LANCETS MISC Use to check blood sugar 3 time(s) daily          Drug Regimen Review  Drug regimen was reviewed and remains appropriate with no significant issues identified   Home: Home Living Family/patient expects to be discharged to:: Inpatient rehab Living Arrangements: Spouse/significant other Available Help at Discharge: Family, Available 24 hours/day Type of Home: [place]  Access: Level entry Home Layout: One level Bathroom Shower/Tub: Multimedia programmer: Standard Home Equipment: Cane - single point, Environmental consultant - 2 wheels, Hand held shower head Additional Comments: does want to use w/c   Functional History: Prior Function Level of Independence: Independent with assistive device(s) Comments: 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 Mobilizing with cane until 3 weeks ago, since then has had multiple falls and used RW   Functional Status:  Mobility: Bed Mobility Overal bed mobility: Needs Assistance Bed Mobility: Supine to Sit, Sit to Supine Supine to sit: Min assist, HOB elevated Sit to supine: Min assist General bed mobility comments: OOB in recliner upon arrival  Transfers Overall transfer level: Needs assistance Equipment used: Rolling walker (2 wheeled) Transfers: Sit to/from Stand Sit to Stand: Min assist, +2 physical assistance General transfer comment: deferred given L ankle/foot pain (reports gout)  Ambulation/Gait Ambulation/Gait assistance: Min assist, +2 physical assistance Gait Distance (Feet): 3 Feet Assistive device: Rolling walker (2 wheeled) Gait Pattern/deviations: Step-to pattern, Decreased stride length General Gait Details: Pt declined today due to gout pain in L foot and unable to compensate with UE cue to L shoulder pain.  Reports he has started his gout medicine and feels pain  will be improved to morrow for ambulation.  States "I always get a gout flair after surgery." Gait velocity: decreased Gait velocity interpretation: <1.31 ft/sec, indicative of household ambulator   ADL: ADL Overall ADL's : Needs assistance/impaired Eating/Feeding: Set up, Sitting Eating/Feeding Details (indicate cue type and reason): too much pain in neck to sit up too long without support on the neck.   Grooming: Wash/dry hands, Wash/dry face, Oral care, Minimal assistance, Sitting Grooming Details (indicate cue type and reason): occasional min assist due to LUE coordination deficits. Upper Body Bathing: Moderate assistance, Sitting Lower Body Bathing: Maximal assistance, Sit to/from stand, Cueing for compensatory techniques Lower Body Bathing Details (indicate cue type and reason): Pt unable to tolerate full bathing adl at this time due to pain but can do small sections with rest breaks. Upper Body Dressing : Moderate assistance, Sitting, Cueing for compensatory techniques Lower Body Dressing: Moderate assistance, Sit to/from stand, Cueing for compensatory techniques Toilet Transfer: Minimal assistance, +2 for safety/equipment, Stand-pivot, BSC Toilet Transfer Details (indicate cue type and reason): pt can pivot to [facility].  Declined walking to bathroom due to pain.  Pt with L lean when up. Toileting- Clothing Manipulation and Hygiene: Minimal assistance, Sit to/from stand, Cueing for compensatory techniques Functional mobility during ADLs: Minimal assistance, Rolling walker, Cueing for safety General ADL Comments: session focus on seated activity and bil UE movement/stretching given bil shoulder pain (L>R)    Cognition: Cognition Overall Cognitive Status: Within Functional Limits for tasks assessed Orientation Level: Oriented X4 Cognition Arousal/Alertness: Awake/alert Behavior During Therapy: WFL for tasks assessed/performed Overall Cognitive Status: Within Functional Limits for tasks  assessed     Blood pressure 122/74, pulse 88, temperature (!) 97.5 F (36.4 C), resp. rate 18, height 5\' 9"  (1.753 m), weight 99.2 kg, SpO2 97 %. Physical Exam Vitals and nursing note reviewed. Exam conducted with a chaperone present.  Constitutional:      Appearance: Normal appearance.     Comments: Obese male, NAD. Up in chair with fine tobacco residue on lips and shirt.  Son at bedside, sitting up in bedside chair, NAD  HENT:     Head: Normocephalic.     Comments: Staples in small area on R side  of scalp- 5 staples right in a row Face symmetrical    Right Ear: External ear normal.     Left Ear: External ear normal.     Nose: Nose normal. No congestion or rhinorrhea.     Mouth/Throat:     Mouth: Mucous membranes are dry.     Pharynx: Oropharynx is clear. No oropharyngeal exudate.  Eyes:     General:        Right eye: No discharge.        Left eye: No discharge.     Extraocular Movements: Extraocular movements intact.  Neck:     Comments: Posterior neck incision with dry dressing with bloody drainage noted.   Has new dressing on neck- pressure dressing noted- C/D/I.  Scalenes and levators very tight B/L Cardiovascular:     Rate and Rhythm: Normal rate and regular rhythm.     Heart sounds: Normal heart sounds. No murmur heard.  No gallop.   Pulmonary:     Comments: CTA B/L- no W/R/R- good air movement Abdominal:     General: Bowel sounds are increased. There is distension.     Comments: Protuberant, but also distended it appears- somewhat firm, NT, hypoactive BS  Musculoskeletal:     Comments: Well healed old left knee surgical scars. Left fore foot and great toe with mild erythema--minimal tenderness to touch.  UEs- at least 4/5 in UE's and LE's- due to neck incision, pt asked for no sig. Resistance to be used. Appears equal except L ankle from gout L ankle DF/PF 3-4/5- due to gout  Skin:    Comments: No skin breakdown on heels- since in chair, unable to assess backside-  son says has none  B/L hand IV's- look ok  Neurological:     Mental Status: He is alert.     Comments: Mild left facial weakness with dysphonia noted. Left sided weakness with sensory deficits.     Psychiatric:     Comments: Appropriate- upset about something- not clear        Lab Results Last 48 Hours        Results for orders placed or performed during the hospital encounter of 09/15/20 (from the past 48 hour(s))  Glucose, capillary     Status: Abnormal    Collection Time: 09/19/20  4:39 PM  Result Value Ref Range    Glucose-Capillary 190 (H) 70 - 99 mg/dL      Comment: Glucose reference range applies only to samples taken after fasting for at least 8 hours.  Glucose, capillary     Status: Abnormal    Collection Time: 09/19/20  9:37 PM  Result Value Ref Range    Glucose-Capillary 186 (H) 70 - 99 mg/dL      Comment: Glucose reference range applies only to samples taken after fasting for at least 8 hours.  Renal function panel     Status: Abnormal    Collection Time: 09/20/20 12:28 AM  Result Value Ref Range    Sodium 133 (L) 135 - 145 mmol/L    Potassium 4.1 3.5 - 5.1 mmol/L    Chloride 99 98 - 111 mmol/L    CO2 25 22 - 32 mmol/L    Glucose, Bld 186 (H) 70 - 99 mg/dL      Comment: Glucose reference range applies only to samples taken after fasting for at least 8 hours.    BUN 16 8 - 23 mg/dL    Creatinine, Ser 0.85 0.61 - 1.24 mg/dL  Calcium 8.8 (L) 8.9 - 10.3 mg/dL    Phosphorus 3.3 2.5 - 4.6 mg/dL    Albumin 3.2 (L) 3.5 - 5.0 g/dL    GFR, Estimated >60 >60 mL/min      Comment: (NOTE) Calculated using the CKD-EPI Creatinine Equation (2021)      Anion gap 9 5 - 15      Comment: Performed at [facility] [address] [place], [place] [address]  Glucose, capillary     Status: Abnormal    Collection Time: 09/20/20  8:16 AM  Result Value Ref Range    Glucose-Capillary 176 (H) 70 - 99 mg/dL      Comment: Glucose reference range applies only to samples taken  after fasting for at least 8 hours.  Glucose, capillary     Status: Abnormal    Collection Time: 09/20/20 12:20 PM  Result Value Ref Range    Glucose-Capillary 230 (H) 70 - 99 mg/dL      Comment: Glucose reference range applies only to samples taken after fasting for at least 8 hours.  Glucose, capillary     Status: Abnormal    Collection Time: 09/20/20  4:16 PM  Result Value Ref Range    Glucose-Capillary 212 (H) 70 - 99 mg/dL      Comment: Glucose reference range applies only to samples taken after fasting for at least 8 hours.  Renal function panel     Status: Abnormal    Collection Time: 09/21/20  2:24 AM  Result Value Ref Range    Sodium 134 (L) 135 - 145 mmol/L    Potassium 3.9 3.5 - 5.1 mmol/L    Chloride 100 98 - 111 mmol/L    CO2 24 22 - 32 mmol/L    Glucose, Bld 205 (H) 70 - 99 mg/dL      Comment: Glucose reference range applies only to samples taken after fasting for at least 8 hours.    BUN 17 8 - 23 mg/dL    Creatinine, Ser 0.83 0.61 - 1.24 mg/dL    Calcium 8.6 (L) 8.9 - 10.3 mg/dL    Phosphorus 3.1 2.5 - 4.6 mg/dL    Albumin 3.1 (L) 3.5 - 5.0 g/dL    GFR, Estimated >60 >60 mL/min      Comment: (NOTE) Calculated using the CKD-EPI Creatinine Equation (2021)      Anion gap 10 5 - 15      Comment: Performed at [facility] [address], [place], [place] [address]  Glucose, capillary     Status: Abnormal    Collection Time: 09/21/20  7:59 AM  Result Value Ref Range    Glucose-Capillary 183 (H) 70 - 99 mg/dL      Comment: Glucose reference range applies only to samples taken after fasting for at least 8 hours.  Glucose, capillary     Status: Abnormal    Collection Time: 09/21/20 11:44 AM  Result Value Ref Range    Glucose-Capillary 216 (H) 70 - 99 mg/dL      Comment: Glucose reference range applies only to samples taken after fasting for at least 8 hours.      Imaging Results (Last 48 hours)  No results found.           Medical Problem List and  Plan: 1.  Generalized weakness secondary to cervical myelopathy s/p C3-C5 cervical lami and decompression.              -patient may  Shower once doesn't need pressure dressing on posterior neck             -ELOS/Goals: 10-12 days- Supervision to mod I 2.  Antithrombotics: -DVT/anticoagulation:  Mechanical: Sequential compression devices, below knee Bilateral lower extremities  - will see, once bleeding stops from cervical incision, when can start Lovenox             -antiplatelet therapy: N/A 3. Pain Management: Continue Neurontin TID with Oxycodone prn 4. Mood: LCSW to follow for evaluation and support.              -antipsychotic agents: N/A 5. Neuropsych: This patient is capable of making decisions on his own behalf. 6. Skin/Wound Care: Routine pressure relief measures. Monitor wound for healing.  7. Fluids/Electrolytes/Nutrition: Monitor I/O. Appetite has been good. Check BMET in am.  8. HTN: Monitor BP tid--continue Toprol XL daily 9. Gout flare: Continue colchicine bid--symptoms improving. Don't think will need Steroids   10. T2DM: Will monitor BS ac/hs. BS poorly controlled--resume metformin. Continue to hold glucotrol.  Will use SSI for elevated BS.  11. Hyponatremia: Recheck labs in am.  12. Constipation: Will add Senna S. Might need sorbitol- no BM since last Friday.  13. Hesitancy: Related to constipation? Continue flomax at bedtime. Will order PVR checks.- pt feels constant urinary leaking IS from Flomax and wants it stopped, but likely for urinary retention- need to check into this.  Augment bowel program as needed.        Bary Leriche, PA-C 09/21/2020        I have personally performed a face to face diagnostic evaluation of this patient and formulated the key components of the plan.  Additionally, I have personally reviewed laboratory data, imaging studies, as well as relevant notes and concur with the physician assistant's documentation above.   The patient's status  has not changed from the original H&P.  Any changes in documentation from the acute care chart have been noted above.

## 2020-09-22 NOTE — Progress Notes (Signed)
Received pt from 4N via wheelchair escorted by RN and NT from 4N. Pt ambulated with rolling walked with RN and NT from 4N. Pt comfortably in bed. Went over visitor policy and given call bell and hospital phone. No needs currently

## 2020-09-22 NOTE — Progress Notes (Signed)
[facility] Individual Statement of Services  Patient Name:  Carolos Fecher.  Date:  09/22/2020  Welcome to the [facility].  Our goal is to provide you with an individualized program based on your diagnosis and situation, designed to meet your specific needs.  With this comprehensive rehabilitation program, you will be expected to participate in at least 3 hours of rehabilitation therapies Monday-Friday, with modified therapy programming on the weekends.  Your rehabilitation program will include the following services:  Physical Therapy (PT), Occupational Therapy (OT), Speech Therapy (ST), 24 hour per day rehabilitation nursing, Therapeutic Recreaction (TR), Neuropsychology, Care Coordinator, Rehabilitation Medicine, Nutrition Services, Pharmacy Services and Other  Weekly team conferences will be held on Tuesday to discuss your progress.  Your Inpatient Rehabilitation Care Coordinator will talk with you frequently to get your input and to update you on team discussions.  Team conferences with you and your family in attendance may also be held.  Expected length of stay: 10-12 Days  Overall anticipated outcome: Supervision to Mod I  Depending on your progress and recovery, your program may change. Your Inpatient Rehabilitation Care Coordinator will coordinate services and will keep you informed of any changes. Your Inpatient Rehabilitation Care Coordinator's name and contact numbers are listed  below.  The following services may also be recommended but are not provided by the [facility]:    [facility] will be made to provide these services after discharge if needed.  Arrangements include referral to agencies that provide these services.  Your insurance has been verified to be:  [employer] Your primary doctor is:  Corrington, Talbert Forest, MD  Pertinent  information will be shared with your doctor and your insurance company.  Inpatient Rehabilitation Care Coordinator:  Erlene Quan, [place] or [phone]  Information discussed with and copy given to patient by: Dyanne Iha, 09/22/2020, 10:04 AM

## 2020-09-22 NOTE — Progress Notes (Signed)
Physical Therapy Session Note  Patient Details  Name: [name] [name]. MRN: [id number] Date of Birth: [birth year]  Today's Date: 09/22/2020 PT Individual Time: 1415-1530 PT Individual Time Calculation (min): 75 min   Short Term Goals: Week 1:     Skilled Therapeutic Interventions/Progress Updates:    Patient in supine, rolled to L and side to sit with CGA to S for technique HOB elevated with rail.  Patient sit to stand to RW mod A to use urinal and mod to max A for standing balance without UE support trying to hold urinal and lower underwear.  Patient leaning to L and fatigued, returned to sitting after assist to pull up underwear.  Patient transferred to reclining w/c with RW and mod A.  Assisted in w/c to ortho gym.  Patient performed car transfer via stand step with RW and mod to max A for assist with LE's in and out of car.  Patient negotiated ramp with mod A with RW leaning to L and some L LE buckling.  Pt able to propel w/c 50' in gym with S.  Negotiated 2 steps (3") with bilateral rails and mod A leading with R LE.  Patient ambulated with Harmon Pier walker x 50' with mod A then 59' with mod to max A due to L lateral lean and weakness and walker management.  Patient c/o L UE pain after seated on EOM and leaning to L so assisted up and into w/c with RW and mod A.  Pushed to room in w/c.  Assisted with bed linen change.  Patient sit to stand and stand step to bed with RW and mod A.  Assist to supine with lifitng legs and positioned for comfort.  Left in supine with call bell/needs in reach and bed alarm activated.   Therapy Documentation Precautions:  Precautions Precautions: Fall, Cervical, Other (comment) Precaution Comments: very painful neck with all movement; has vertebral artery stenosis Required Braces or Orthoses:  (per orders, no brace needed) Restrictions Weight Bearing Restrictions: No Pain: Pain Assessment Pain Score: 7  Pain Type: Surgical pain Pain Location: Neck Pain  Orientation: Mid;Posterior Pain Descriptors / Indicators: Grimacing;Discomfort;Radiating Pain Onset: With Activity Pain Intervention(s): Distraction;Rest;Ambulation/increased activity       Therapy/Group: Individual Therapy  Reginia Naas  [place], PT 09/22/2020, 3:03 PM

## 2020-09-22 NOTE — Progress Notes (Signed)
° °  Patient Details  Name: [name] [name]. MRN: [id number] Date of Birth: Dec 05, 1945  Today's Date: 09/22/2020  Hospital Problems: Active Problems:   Cervical myelopathy [facility])  Past Medical History:  Past Medical History:  Diagnosis Date   CAD (coronary artery disease)    Cancer ([place])    Diabetes ([place])    Dyslipidemia    Gout    History of kidney stones    HTN (hypertension)    Myocardial infarction [facility])    Past Surgical History:  Past Surgical History:  Procedure Laterality Date   ANTERIOR CERVICAL DECOMP/DISCECTOMY FUSION Left 10/20/2019   Procedure: Left Cervical Three-Four Cervical Four-Five Cervical Five-Six Anterior cervical decompression/discectomy/fusion;  Surgeon: Judith Part, MD;  Location: [place];  Service: Neurosurgery;  Laterality: Left;  Left Cervical Three-Four Cervical Four-Five Cervical Five-Six Anterior cervical decompression/discectomy/fusion   BACK SURGERY     left knee surgery     left shoulder surgery     POSTERIOR CERVICAL FUSION/FORAMINOTOMY N/A 09/17/2020   Procedure: CERVICAL THREE TO CERVICAL SIX POSTERIOR CERVICAL DECOMPRESSION;  Surgeon: Judith Part, MD;  Location: [place];  Service: Neurosurgery;  Laterality: N/A;   right knee surgery     Social History:  reports that he quit smoking about 31 years ago. His smoking use included cigarettes. He started smoking about 67 years ago. He has a 180.00 pack-year smoking history. His smokeless tobacco use includes snuff. He reports that he does not drink alcohol and does not use drugs.  Family / Support Systems Spouse/Significant Other: Mechele Claude Children: Joni (Daugter), Doren Custard (Son) Anticipated Caregiver: Spouse Ability/Limitations of Caregiver: Spouse can provide supervision, children able to provide Min A Caregiver Availability: 24/7  Social History Preferred language: English Religion: Protestant Read: Yes Write: Yes Guardian/Conservator: none    Abuse/Neglect Abuse/Neglect Assessment Can Be Completed: Yes Physical Abuse: Denies Verbal Abuse: Denies Sexual Abuse: Denies Exploitation of patient/patient's resources: Denies Self-Neglect: Denies  Emotional Status Recent Psychosocial Issues: no Psychiatric History: no Substance Abuse History: no  Patient / Family Perceptions, Expectations & Goals Pt/Family understanding of illness & functional limitations: staff has been updating patient family Premorbid pt/family roles/activities: Patient independent previously (ADLS, Inddor Mobility, Stairs, Engineer, technical sales) Anticipated changes in roles/activities/participation: Family able to provide sopme assistance (spouse and children) Pt/family expectations/goals: Supervision to [place]: None Premorbid Home Care/DME Agencies: Other (Comment) (Cane, Conservation officer, nature,) Transportation available at discharge: Family able to transport  Discharge Planning Living Arrangements: Spouse/significant other Support Systems: Spouse/significant other, Children Type of Residence: Private residence (One level home, level entry, walk-in shower) Insurance Resources: Multimedia programmer (specify) ([place]) Financial Screen Referred: No Living Expenses: Own Money Management: Patient, Spouse Does the patient have any problems obtaining your medications?: No Home Management: Previously independent Patient/Family Preliminary Plans: Spouse able to asssit Care Coordinator Anticipated Follow Up Needs: HH/OP Expected length of stay: 10-12 Days  Clinical Impression Sw entered room introduced self, explained role and process. Daughter contact information left on board in room. Called daughter at bedside in room, daughter reports patient spouse will be primary contact and she is next available. Patient experiencing shoulder pain.  Addressed all questions and concerns. Sw will continue to follow up.   Dyanne Iha 09/22/2020, 12:46 PM

## 2020-09-22 NOTE — Progress Notes (Signed)
[place] PHYSICAL MEDICINE & REHABILITATION PROGRESS NOTE   Subjective/Complaints:  Pt reports neck and shoulder pain SO bad- can barely sit up in chair, the muscles are so tight- spasming.   Has "thrown him back in bed".  LBM yesterday.   Based on chart review, hasn't taken flexeril since rehab admission.     ROS:  Pt denies SOB, abd pain, CP, N/V/C/D, and vision changes    Objective:   No results found. Recent Labs    09/22/20 0338  WBC 8.7  HGB 10.5*  HCT 32.0*  PLT 232   Recent Labs    09/20/20 0028 09/21/20 0224  NA 133* 134*  K 4.1 3.9  CL 99 100  CO2 25 24  GLUCOSE 186* 205*  BUN 16 17  CREATININE 0.85 0.83  CALCIUM 8.8* 8.6*    Intake/Output Summary (Last 24 hours) at 09/22/2020 1852 Last data filed at 09/22/2020 1715 Gross per 24 hour  Intake 833 ml  Output 1975 ml  Net -1142 ml        Physical Exam: Vital Signs Blood pressure 120/74, pulse 76, temperature 97.8 F (36.6 C), temperature source Oral, resp. rate 19, height 5\' 9"  (1.753 m), weight 102.3 kg, SpO2 99 %.  Constitutional: sitting up slightly in bed- in pain, appropriate, NAD HENT: 5 staples on R scalp- all in line Neck: dressing C/D/I Rhomboids esp B/L L>R-, and lesser, scalenes and upper traps VERY tight.  Cardiovascular: RRR Pulmonary: CTA B/L- no W/R/R- good air movement Abdominal: Soft, NT, ND, (+)BS  Musculoskeletal:  Comments: Well healed old left knee surgical scars. Left fore foot and great toe with mild erythema--minimal tenderness to touch.  UEs- at least 4/5 in UE's and LE's- due to neck incision, pt asked for no sig. Resistance to be used. Appears equal except L ankle from gout L ankle DF/PF 3-4/5- due to gout Skin: no skin breakdown on heels B/L B/L hand IV's- look ok Neurological:  Mental Status: He is alert.  Comments: Mild left facial weakness with dysphonia noted. Left sided weakness with sensory deficits. Psychiatric:  Comments:  appropriate, in pain      Assessment/Plan: 1. Functional deficits which require 3+ hours per day of interdisciplinary therapy in a comprehensive inpatient rehab setting.  Physiatrist is providing close team supervision and 24 hour management of active medical problems listed below.  Physiatrist and rehab team continue to assess barriers to discharge/monitor patient progress toward functional and medical goals  Care Tool:  Bathing    Body parts bathed by patient: Face, Front perineal area, Right upper leg, Left upper leg   Body parts bathed by helper: Right arm, Left arm, Chest, Abdomen, Buttocks, Right lower leg, Left lower leg     Bathing assist Assist Level: Maximal Assistance - Patient 24 - 49%     Upper Body Dressing/Undressing Upper body dressing   What is the patient wearing?: Hospital gown only    Upper body assist Assist Level: Maximal Assistance - Patient 25 - 49%    Lower Body Dressing/Undressing Lower body dressing      What is the patient wearing?: Underwear/pull up     Lower body assist Assist for lower body dressing: Maximal Assistance - Patient 25 - 49%     Toileting Toileting Toileting Activity did not occur (Clothing management and hygiene only): N/A (no void or bm)  Toileting assist Assist for toileting: Moderate Assistance - Patient 50 - 74% (standing) Assistive Device Comment: urinal   Transfers Chair/bed transfer  Transfers assist     Chair/bed transfer assist level: Moderate Assistance - Patient 50 - 74%     Locomotion Ambulation   Ambulation assist      Assist level: Maximal Assistance - Patient 25 - 49% Assistive device: Walker-Eva Max distance: 50   Walk 10 feet activity   Assist     Assist level: Moderate Assistance - Patient - 50 - 74% Assistive device: Walker-Eva   Walk 50 feet activity   Assist    Assist level: Maximal Assistance - Patient 25 - 49% Assistive device: Walker-Eva    Walk 150 feet  activity   Assist Walk 150 feet activity did not occur: Safety/medical concerns         Walk 10 feet on uneven surface  activity   Assist     Assist level: Moderate Assistance - Patient - 50 - 74% Assistive device: Aeronautical engineer Will patient use wheelchair at discharge?: No Type of Wheelchair: Manual    Wheelchair assist level: Supervision/Verbal cueing Max wheelchair distance: 50'    Wheelchair 50 feet with 2 turns activity    Assist        Assist Level: Supervision/Verbal cueing   Wheelchair 150 feet activity     Assist  Wheelchair 150 feet activity did not occur: Safety/medical concerns       Blood pressure 120/74, pulse 76, temperature 97.8 F (36.6 C), temperature source Oral, resp. rate 19, height 5\' 9"  (1.753 m), weight 102.3 kg, SpO2 99 %.  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 -patient may Shower once doesn't need pressure dressing on posterior neck -ELOS/Goals: 10-12 days- Supervision to mod I 2. Antithrombotics: -DVT/anticoagulation:Mechanical:Sequential compression devices, below kneeBilateral lower extremities - will see, once bleeding stops from cervical incision, when can start Lovenox -antiplatelet therapy: N/A 3. Pain Management:Continue Neurontin TID with Oxycodone prn  11/12- will add flexeril scheduled for pain/spasms- if need be, will do trigger point injections this weekend.  4. Mood:LCSW to follow for evaluation and support. -antipsychotic agents: N/A 5. Neuropsych: This patientiscapable of making decisions on hisown behalf. 6. Skin/Wound Care:Routine pressure relief measures. Monitor wound for healing. 7. Fluids/Electrolytes/Nutrition:Monitor I/O. Appetite has been good. Check BMET in am.  8. HTN: Monitor BP tid--continue Toprol XL  daily  11/12- BP well controlled- con't regimen 9. Gout flare: Continue colchicine bid--symptoms improving.Don't think will need Steroids  11/12- said much improved- no steroids required 10. T2DM: Will monitor BS ac/hs. BS poorly controlled--resume metformin. Continue to hold glucotrol. Will use SSI for elevated BS  11/12- jist restarted Metformin- BGs 160s-226- will give 1-2 days and see if better- then restart glucotrol.  11. Hyponatremia: Recheck labs in am.  12. Constipation: Will add Senna S.Might need sorbitol- no BM since last Friday.  11/12- LBM yesterday- con't regimen 13. Hesitancy: Related to constipation? Continue flomax at bedtime. Will order PVR checks.- pt feels constant urinary leaking IS from Flomax and wants it stopped, but likely for urinary retention- need to check into this.Augment bowel program as needed.      LOS: 1 days A FACE TO FACE EVALUATION WAS PERFORMED  Jocelynne Duquette 09/22/2020, 6:52 PM

## 2020-09-23 ENCOUNTER — Inpatient Hospital Stay (HOSPITAL_COMMUNITY): Payer: Medicare HMO | Admitting: Occupational Therapy

## 2020-09-23 ENCOUNTER — Inpatient Hospital Stay (HOSPITAL_COMMUNITY): Payer: Medicare HMO

## 2020-09-23 LAB — GLUCOSE, CAPILLARY
Glucose-Capillary: 195 mg/dL — ABNORMAL HIGH (ref 70–99)
Glucose-Capillary: 186 mg/dL — ABNORMAL HIGH (ref 70–99)
Glucose-Capillary: 126 mg/dL — ABNORMAL HIGH (ref 70–99)
Glucose-Capillary: 165 mg/dL — ABNORMAL HIGH (ref 70–99)

## 2020-09-23 MED ORDER — TAMSULOSIN HCL 0.4 MG PO CAPS
0.8000 mg | ORAL_CAPSULE | Freq: Every day | ORAL | Status: DC
  Administered 2020-09-23 – 2020-10-02 (×10): 0.8 mg via ORAL
  Filled 2020-09-23 (×10): qty 2

## 2020-09-23 MED ORDER — DIAZEPAM 5 MG PO TABS
5.0000 mg | ORAL_TABLET | Freq: Two times a day (BID) | ORAL | Status: DC
  Administered 2020-09-23 – 2020-10-03 (×21): 5 mg via ORAL
  Filled 2020-09-23 (×21): qty 1

## 2020-09-23 NOTE — Progress Notes (Signed)
Physical Therapy Session Note  Patient Details  Name: [name] [name]. MRN: [id number] Date of Birth: 10/18/1946  Today's Date: 09/23/2020 PT Individual Time: 1300-1415 and 1000-1045 PT Individual Time Calculation (min): 75 min and 45 min  Short Term Goals: Week 1:  PT Short Term Goal 1 (Week 1): Pt will perform supine<>sit with min assist using same features as will have at home PT Short Term Goal 2 (Week 1): Pt will perform sit<>stand using LRAD with CGA PT Short Term Goal 3 (Week 1): Pt will perform stand pivot transfers using LRAD with CGA PT Short Term Goal 4 (Week 1): Pt will ambulate at least 33ft using LRAD with CGA  Skilled Therapeutic Interventions/Progress Updates:     AM SESSION PAIN Pt initially oob in wc, c/o cervical discomfort, reduced decline of seatback and pt stated he felt more comfortable.   Transported to gym for session w/focus on: BALANCE, Gait.  Gait:  27ft w/RW and min to max assist, two episodes of bilat knee buckling each requiring max assist for recovery.  32 ft w/RW and +2 min assist for safety due to buckling first trial, no buckling second trial.  In paralell bars:  Static stand w/bilat UE support, requires cues to mainain knee extension, mild L lean, min assist Single UE support, L leand and L knee flexion tendency increase No UE support, further increase in lean, difficulty locating midline, iproves w/repeated effort but increased cueing required. Added cervical nods/slow, for increased challenge, min t mod assist.   Seated LE therex: LAQx 3x10 w/4lb wt Hamstring curls 3x10 w/L2 theraband resistance Hip abd w/L2 theraband resistance.  PM SESSION PAIN denies pain this pm, states he had his meds prior to session  Pt intially oob in wc, agreeable to session Educated pt re use of LiteGait for safety w/gait due to knee buckling, use of device as strengthening activity/gait endurance. Pt agreeable Sit to stand from wc to LiteGait w/min  bodywt assist.  Stood x 5 min while therapist set up and donned harness/settings w/cga.  Trial 1 Gait 65ft w/assist to navigate, maintains approx 20-30 degrees L knee flexion, leans L, buckling occurs x 2 during gait/not related to any pain. Pt rested x 8-9 min in sitting.  Agreeable to second gait trial. Trial 2  161ft w/assist/deviations as above, buckling w/fatigue, min bodywt assist. Pt rested 5 min in sitting. Sit to stand w/min assist, stood for removal of harness 1 min w/cga to min assist.  Therex: contined LE from am session Hip abd 2x20 L2 theraband Hamstring curls x x20  wc to bed Squat pvt transfer w/min assist.  Sit to supine w/cga, pt declines assistance stating "I hate having people help me, I hate being in a wheel chair".  Discussed accomplishments of session and goals of independent function in IPR.   Pt left supine w/rails up x 3, alarm set, bed in lowest position, and needs in reach.          Therapy Documentation Precautions:  Precautions Precautions: Fall, Cervical, Other (comment) Precaution Comments: very painful neck with all movement; has vertebral artery stenosis Required Braces or Orthoses:  (per orders, no brace needed) Restrictions Weight Bearing Restrictions: No Other Position/Activity Restrictions: no brace needed    Therapy/Group: Individual Therapy  Callie Fielding, [place] 09/23/2020, 3:20 PM

## 2020-09-23 NOTE — Progress Notes (Signed)
Occupational Therapy Session Note  Patient Details  Name: [name] [name]. MRN: [id number] Date of Birth: 1945-12-18  Today's Date: 09/23/2020 OT Individual Time: 6789-3810 and 1452-1540 OT Individual Time Calculation (min): 61 min and 48 min  Short Term Goals: Week 1:  OT Short Term Goal 1 (Week 1): Pt will complete a toilet transfer with 1 assist and LRAD OT Short Term Goal 2 (Week 1): Pt will complete UB dressing with Mod A OT Short Term Goal 3 (Week 1): Pt will complete 1/3 components of donning pants with supervision assist  Skilled Therapeutic Interventions/Progress Updates:    Pt greeted in bed, slid down to the baseboard with his meal tray beside him. Pt appearing a little distressed, reporting that he wanted to eat his breakfast but unable to eat in bed. Stated he wanted to sit EOB but was afraid that he would fall if he tried this independently. Agreeable to sit up with therapist so that he could eat. Mod A for supine<sit with pt exhibiting Lt lean, needing unilateral support on the bedrail to maintain upright. OT suggested transferring to the w/c to help with managing his neck pain, pt agreeable but needing a supine rest before attempting due to pain. OT provided him with an ice pack at this time. On the next attempt, pt able to transfer to the w/c using RW with Min A and increased time via stand pivot. Set him up in the w/c for optimal comfort and pain relief and then pt ate his meal given setup assistance. Noted some incoordination in the Lt UE during reaching but no tremulous activity noted in either UE today. Pt also able to sanitize his hands before eating given setup assistance with small hand sanitizer. Transitioned to oral care at the sink, pt uncapping his mouthwash container and capping it again, unable to lean forward into sink to spit due to pain, therefore used the kidney cup. He declined toileting or completing simulated transfer, also waiting for family to bring in more  clothing today so declined dressing as well. Agreeable to engage in UB stretching to decrease tension/tightness in neck and shoulders. Guided him through gentle stretches including shoulder rolls, scapular pinches, shoulder shrugs, shoulder retraction with full extension of elbow, wrist rolls, finger spreads, and forearm supination/pronation. Pt able to complete shoulder exercises 1 arm at a time only, 2-3 reps for shoulder rolls but 5-10 reps for all other exercises. Limited supination abilities bilaterally at baseline due to hx injury + surgery. Pt reported that he felt like these exercises were helping to "loosen up" his painful tightness. We discussed importance of gentle UB ROM at rest for pain mgt. Provided him with lavender aromatherapy to increase feelings of relaxation as well given pt consent. He remained sitting up in the w/c with all needs within reach, safety belt, and ice pack for his neck. Notified RN to assess skin integrity posterior neck and remove ice pack as appropriate, pt reported having good sensation where ice pack was placed and able to notify staff if it became too cold.    Note that pt told OT yesterday that eating had been challenging when he was admitted to CIR, pleased that it was easier for him today given change in positioning   2nd Session 1:1 tx (48 min) Pt greeted in bed, pain manageable for tx though pt requesting lighter therapy due to fatigue. Mod A for supine<sit and Min A for stand pivot<w/c using RW. OT escorted him to the outdoor patio  to sit in the sunlight for psychosocial health. We discussed using holistic pain mgt techniques in conjunction with prescribed CIR medicine in order to maximize participation in therapies. Guided him through diaphragmatic breathing exercises, pt needing rest after every 5 reps. Education also provided on using visualization techniques while engaging in mindful breathing exercises. At end of session pt was returned to the room via w/c, Min  A stand pivot<bed (Min balance assistance while using the urinal first using RW) and CGA for returning to supine. Left him in bed with all needs within reach and bed alarm set. Tx focus placed on OOB tolerance, pt education, and holistic pain mgt.   Therapy Documentation Precautions:  Precautions Precautions: Fall, Cervical, Other (comment) Precaution Comments: very painful neck with all movement; has vertebral artery stenosis Required Braces or Orthoses:  (per orders, no brace needed) Restrictions Weight Bearing Restrictions: No Other Position/Activity Restrictions: no brace needed Vital Signs: Therapy Vitals Pulse Rate: 76 BP: (!) 144/76 ADL: ADL Eating: Not assessed Grooming: Supervision/safety Where Assessed-Grooming: Sitting at sink Upper Body Bathing: Maximal assistance Where Assessed-Upper Body Bathing: Sitting at sink Lower Body Bathing: Maximal assistance Where Assessed-Lower Body Bathing: Sitting at sink, Standing at sink Upper Body Dressing: Maximal assistance Where Assessed-Upper Body Dressing: Sitting at sink Lower Body Dressing: Maximal assistance Where Assessed-Lower Body Dressing: Sitting at sink, Standing at sink Toileting: Not assessed Toilet Transfer: Not assessed Tub/Shower Transfer: Not assessed ADL Comments: Functional assessment profoundly limited by pain      Therapy/Group: Individual Therapy  [name] [name] A [name] [name] 09/23/2020, 12:27 PM

## 2020-09-23 NOTE — Progress Notes (Signed)
[place] PHYSICAL MEDICINE & REHABILITATION PROGRESS NOTE   Subjective/Complaints:   Pt reports no difference with flexeril-neck hurts so bad, muscles "cramping" like crazy.   wants to try something else- will try valium and if doesn't work, will do trigger point injections tomorrow.   Upset because requiring in/out caths- is peeing, but not emptying- volumes >350 cc- so explained to him really high risk of UTI if don't do this/cath him.  Admits doesn't empty, but still unhappy about caths.   LBM day came ot rehab- ~ 36 hrs ago.   ROS:  Pt denies SOB, abd pain, CP, N/V/C/D, and vision changes   Objective:   No results found. Recent Labs    09/22/20 0338  WBC 8.7  HGB 10.5*  HCT 32.0*  PLT 232   Recent Labs    09/21/20 0224  NA 134*  K 3.9  CL 100  CO2 24  GLUCOSE 205*  BUN 17  CREATININE 0.83  CALCIUM 8.6*    Intake/Output Summary (Last 24 hours) at 09/23/2020 1401 Last data filed at 09/23/2020 0900 Gross per 24 hour  Intake 457 ml  Output 2875 ml  Net -2418 ml        Physical Exam: Vital Signs Blood pressure (!) 144/76, pulse 76, temperature 98.1 F (36.7 C), resp. rate 16, height 5\' 9"  (1.753 m), weight 102.4 kg, SpO2 98 %.  Constitutional: sitting up slightly in bed- upset about neck pain and caths, NAD HENT: 5 staples on R scalp- all in line- no change Neck: dressing C/D/I- posterior neck Very tight in rhomboids, mostly- some upper traps, scalenes, levators L>R Cardiovascular: RRR Pulmonary: CTA B/L- no W/R/R- good air movement Abdominal: Soft, NT, ND, (+)BS  Musculoskeletal:  Comments: Well healed old left knee surgical scars. Left fore foot and great toe with mild erythema--minimal tenderness to touch.  UEs- at least 4/5 in UE's and LE's- due to neck incision, pt asked for no sig. Resistance to be used. Appears equal except L ankle from gout L ankle DF/PF 3-4/5- due to gout Skin: no skin breakdown on heels B/L B/L hand IV's- look  ok Neurological:  Mental Status: He is alert. Ox3  Comments: Mild left facial weakness with dysphonia noted. Left sided weakness with sensory deficits. Psychiatric:  Comments: unhappy due to caths/pain      Assessment/Plan: 1. Functional deficits which require 3+ hours per day of interdisciplinary therapy in a comprehensive inpatient rehab setting.  Physiatrist is providing close team supervision and 24 hour management of active medical problems listed below.  Physiatrist and rehab team continue to assess barriers to discharge/monitor patient progress toward functional and medical goals  Care Tool:  Bathing    Body parts bathed by patient: Face, Front perineal area, Right upper leg, Left upper leg   Body parts bathed by helper: Right arm, Left arm, Chest, Abdomen, Buttocks, Right lower leg, Left lower leg     Bathing assist Assist Level: Maximal Assistance - Patient 24 - 49%     Upper Body Dressing/Undressing Upper body dressing   What is the patient wearing?: Pull over shirt    Upper body assist Assist Level: Minimal Assistance - Patient > 75%    Lower Body Dressing/Undressing Lower body dressing      What is the patient wearing?: Pants, Underwear/pull up     Lower body assist Assist for lower body dressing: Moderate Assistance - Patient 50 - 74%     Toileting Toileting Toileting Activity did not occur Arboriculturist  management and hygiene only): N/A (no void or bm)  Toileting assist Assist for toileting: Moderate Assistance - Patient 50 - 74% Assistive Device Comment: urinal   Transfers Chair/bed transfer  Transfers assist     Chair/bed transfer assist level: Minimal Assistance - Patient > 75% Chair/bed transfer assistive device: Programmer, multimedia   Ambulation assist      Assist level: Minimal Assistance - Patient > 75% Assistive device: Walker-rolling Max distance: 85ft   Walk 10 feet activity   Assist     Assist  level: Minimal Assistance - Patient > 75% Assistive device: Walker-rolling   Walk 50 feet activity   Assist Walk 50 feet with 2 turns activity did not occur: Safety/medical concerns  Assist level: Maximal Assistance - Patient 25 - 49% Assistive device: Walker-Eva    Walk 150 feet activity   Assist Walk 150 feet activity did not occur: Safety/medical concerns         Walk 10 feet on uneven surface  activity   Assist Walk 10 feet on uneven surfaces activity did not occur: Safety/medical concerns   Assist level: Moderate Assistance - Patient - 50 - 74% Assistive device: Aeronautical engineer Will patient use wheelchair at discharge?:  (TBD but do not anticipate) Type of Wheelchair: Manual    Wheelchair assist level: Supervision/Verbal cueing Max wheelchair distance: 50'    Wheelchair 50 feet with 2 turns activity    Assist        Assist Level: Supervision/Verbal cueing   Wheelchair 150 feet activity     Assist  Wheelchair 150 feet activity did not occur: Safety/medical concerns       Blood pressure (!) 144/76, pulse 76, temperature 98.1 F (36.7 C), resp. rate 16, height 5\' 9"  (1.753 m), weight 102.4 kg, SpO2 98 %.  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 -patient may Shower once doesn't need pressure dressing on posterior neck -ELOS/Goals: 10-12 days- Supervision to mod I 2. Antithrombotics: -DVT/anticoagulation:Mechanical:Sequential compression devices, below kneeBilateral lower extremities - will see, once bleeding stops from cervical incision, when can start Lovenox -antiplatelet therapy: N/A 3. Pain Management:Continue Neurontin TID with Oxycodone prn  11/12- will add flexeril scheduled for pain/spasms- if need be, will do trigger point injections this weekend.  11/13- will try valium  5 mg BID - if not helpful, do TrP injections 4. Mood:LCSW to follow for evaluation and support. -antipsychotic agents: N/A 5. Neuropsych: This patientiscapable of making decisions on hisown behalf. 6. Skin/Wound Care:Routine pressure relief measures. Monitor wound for healing. 7. Fluids/Electrolytes/Nutrition:Monitor I/O. Appetite has been good. Check BMET in am.  8. HTN: Monitor BP tid--continue Toprol XL daily  11/12- BP well controlled- con't regimen 9. Gout flare: Continue colchicine bid--symptoms improving.Don't think will need Steroids  11/12- said much improved- no steroids required 10. T2DM: Will monitor BS ac/hs. BS poorly controlled--resume metformin. Continue to hold glucotrol. Will use SSI for elevated BS  11/12- jist restarted Metformin- BGs 160s-226- will give 1-2 days and see if better- then restart glucotrol.   11/13- a little better- max is 200- will add glucotrol tomorrow if not better 11. Hyponatremia: Recheck labs in am.  12. Constipation: Will add Senna S.Might need sorbitol- no BM since last Friday.  11/12- LBM yesterday- con't regimen  11/13- LBM 2 days ago- if no BM by tomorrow, will try Sorbitol.  13. Hesitancy: Related to constipation? Continue flomax at bedtime.  Will order PVR checks.- pt feels constant urinary leaking IS from Flomax and wants it stopped, but likely for urinary retention- need to check into this.Augment bowel program as needed.   11/13- requiring in and out caths due to PVRs >350cc- con't for now- max out flomax. Will help him retain LESS- will tell nursing.       LOS: 2 days A FACE TO FACE EVALUATION WAS PERFORMED  Keosha Rossa 09/23/2020, 2:01 PM

## 2020-09-24 ENCOUNTER — Inpatient Hospital Stay (HOSPITAL_COMMUNITY): Payer: Medicare HMO | Admitting: Physical Therapy

## 2020-09-24 DIAGNOSIS — M7918 Myalgia, other site: Secondary | ICD-10-CM

## 2020-09-24 LAB — GLUCOSE, CAPILLARY
Glucose-Capillary: 183 mg/dL — ABNORMAL HIGH (ref 70–99)
Glucose-Capillary: 143 mg/dL — ABNORMAL HIGH (ref 70–99)
Glucose-Capillary: 204 mg/dL — ABNORMAL HIGH (ref 70–99)
Glucose-Capillary: 183 mg/dL — ABNORMAL HIGH (ref 70–99)

## 2020-09-24 MED ORDER — LIDOCAINE HCL 1 % IJ SOLN
10.0000 mL | Freq: Once | INTRAMUSCULAR | Status: AC
  Administered 2020-09-24: 10 mL via INTRADERMAL
  Filled 2020-09-24: qty 10

## 2020-09-24 NOTE — Progress Notes (Signed)
[name] PHYSICAL MEDICINE & REHABILITATION PROGRESS NOTE   Subjective/Complaints:    Pt reports pain is ~ 7/10- better than it was with valium- doesn't like to take oxycodone, but Valium more helpful than flexeril- still real tight.   Also requiring caths still- 1000 cc overnight/this AM and 700 cc at 10-11 am this morning.   They use lidocaine but caths still painful.   Wants to walk more- feels will help bladder- explained went up on flomax since will help him EMPTY better.    ROS:  Pt denies SOB, abd pain, CP, N/V/C/D, and vision changes   Objective:   No results found. Recent Labs    09/22/20 0338  WBC 8.7  HGB 10.5*  HCT 32.0*  PLT 232   No results for input(s): NA, K, CL, CO2, GLUCOSE, BUN, CREATININE, CALCIUM in the last 72 hours.  Intake/Output Summary (Last 24 hours) at 09/24/2020 1357 Last data filed at 09/24/2020 0900 Gross per 24 hour  Intake 540 ml  Output 3125 ml  Net -2585 ml        Physical Exam: Vital Signs Blood pressure 134/74, pulse 72, temperature 97.6 F (36.4 C), temperature source Oral, resp. rate 16, height 5\' 9"  (1.753 m), weight 102 kg, SpO2 98 %.  Constitutional: sitting up in bed- appear sin pain/tight still- daughter at bedside- RN cathing him, NAD HENT: 5 staples on R scalp- all in line- no change Neck: dressing has a little localized swelling on L side of incision- otherwise sutures in place- no erythema- looks great otehrwise Very tight in rhomboids, mostly- some upper traps, scalenes, levators L>R- a little looser, but still very tight Cardiovascular: RRR Pulmonary: Soft, NT, ND, (+)BS  Musculoskeletal:  Comments: Well healed old left knee surgical scars. Left fore foot and great toe with mild erythema--minimal tenderness to touch.  UEs- at least 4/5 in UE's and LE's- due to neck incision, pt asked for no sig. Resistance to be used. Appears equal except L ankle from gout L ankle DF/PF 3-4/5- due to gout Skin: no  skin breakdown on heels B/L IV's out Neurological:  Mental Status: HOx3  Comments: Mild left facial weakness with dysphonia noted. Left sided weakness with sensory deficits. Psychiatric:  Comments: sad/depressed affect due to caths/pain      Assessment/Plan: 1. Functional deficits which require 3+ hours per day of interdisciplinary therapy in a comprehensive inpatient rehab setting.  Physiatrist is providing close team supervision and 24 hour management of active medical problems listed below.  Physiatrist and rehab team continue to assess barriers to discharge/monitor patient progress toward functional and medical goals  Care Tool:  Bathing    Body parts bathed by patient: Face, Front perineal area, Right upper leg, Left upper leg   Body parts bathed by helper: Right arm, Left arm, Chest, Abdomen, Buttocks, Right lower leg, Left lower leg     Bathing assist Assist Level: Maximal Assistance - Patient 24 - 49%     Upper Body Dressing/Undressing Upper body dressing   What is the patient wearing?: Pull over shirt    Upper body assist Assist Level: Minimal Assistance - Patient > 75%    Lower Body Dressing/Undressing Lower body dressing      What is the patient wearing?: Pants     Lower body assist Assist for lower body dressing: Moderate Assistance - Patient 50 - 74%     Toileting Toileting Toileting Activity did not occur (Clothing management and hygiene only): N/A (no void or bm)  Toileting assist Assist for toileting: Moderate Assistance - Patient 50 - 74% Assistive Device Comment: urinal   Transfers Chair/bed transfer  Transfers assist     Chair/bed transfer assist level: Minimal Assistance - Patient > 75% Chair/bed transfer assistive device: Programmer, multimedia   Ambulation assist      Assist level: Minimal Assistance - Patient > 75% Assistive device: Lite Gait Max distance: 172   Walk 10 feet activity   Assist      Assist level: Maximal Assistance - Patient 25 - 49% Assistive device: Walker-rolling   Walk 50 feet activity   Assist Walk 50 feet with 2 turns activity did not occur: Safety/medical concerns  Assist level: Dependent - Patient 0% Assistive device: Lite Gait    Walk 150 feet activity   Assist Walk 150 feet activity did not occur: Safety/medical concerns  Assist level: Dependent - Patient 0% Assistive device: Lite Gait    Walk 10 feet on uneven surface  activity   Assist Walk 10 feet on uneven surfaces activity did not occur: Safety/medical concerns   Assist level: Moderate Assistance - Patient - 50 - 74% Assistive device: Aeronautical engineer Will patient use wheelchair at discharge?:  (TBD but do not anticipate) Type of Wheelchair: Manual    Wheelchair assist level: Supervision/Verbal cueing Max wheelchair distance: 50'    Wheelchair 50 feet with 2 turns activity    Assist        Assist Level: Supervision/Verbal cueing   Wheelchair 150 feet activity     Assist  Wheelchair 150 feet activity did not occur: Safety/medical concerns       Blood pressure 134/74, pulse 72, temperature 97.6 F (36.4 C), temperature source Oral, resp. rate 16, height 5\' 9"  (1.753 m), weight 102 kg, SpO2 98 %.  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 -patient may Shower once doesn't need pressure dressing on posterior neck -ELOS/Goals: 10-12 days- Supervision to mod I 2. Antithrombotics: -DVT/anticoagulation:Mechanical:Sequential compression devices, below kneeBilateral lower extremities - will see, once bleeding stops from cervical incision, when can start Lovenox -antiplatelet therapy: N/A 3. Pain Management:Continue Neurontin TID with Oxycodone prn  11/12- will add flexeril scheduled for pain/spasms- if  need be, will do trigger point injections this weekend.  11/13- will try valium 5 mg BID - if not helpful, do TrP injections  11/14- will do trigger point injections today.   4. Mood:LCSW to follow for evaluation and support. -antipsychotic agents: N/A 5. Neuropsych: This patientiscapable of making decisions on hisown behalf. 6. Skin/Wound Care:Routine pressure relief measures. Monitor wound for healing. 7. Fluids/Electrolytes/Nutrition:Monitor I/O. Appetite has been good. Check BMET in am.  8. HTN: Monitor BP tid--continue Toprol XL daily  11/12- BP well controlled- con't regimen 9. Gout flare: Continue colchicine bid--symptoms improving.Don't think will need Steroids  11/12- said much improved- no steroids required  11/14- resolved 10. T2DM: Will monitor BS ac/hs. BS poorly controlled--resume metformin. Continue to hold glucotrol. Will use SSI for elevated BS  11/12- jist restarted Metformin- BGs 160s-226- will give 1-2 days and see if better- then restart glucotrol.   11/13- a little better- max is 200- will add glucotrol tomorrow if not better  11/14- BGs 126-186- con't regimen 11. Hyponatremia: Recheck labs in am.  12. Constipation: Will add Senna S.Might need sorbitol- no BM since last Friday.  11/12- LBM yesterday- con't regimen  11/13- LBM 2 days ago- if  no BM by tomorrow, will try Sorbitol.  13. Hesitancy: Related to constipation? Continue flomax at bedtime. Will order PVR checks.- pt feels constant urinary leaking IS from Flomax and wants it stopped, but likely for urinary retention- need to check into this.Augment bowel program as needed.   11/13- requiring in and out caths due to PVRs >350cc- con't for now- max out flomax. Will help him retain LESS- will tell nursing.   11/14- explained to pt increased flomax- should help in a few days.    Procedure note: Got consent signed by pt, witness, myself  Patient here for trigger point injections for   Consent done and on chart.  Cleaned areas with alcohol and injected using a 27 gauge 1.5 inch needle  Injected 3cc Using 1% Lidocaine with no EPI  Upper traps B/L- x2 on L Levators B/L Posterior scalenes B/L Middle scalenes Splenius Capitus Pectoralis Major Rhomboids L x2; R x1 Infraspinatus Teres Major/minor Thoracic paraspinals Lumbar paraspinals Other injections-    Patient's level of pain prior was 7-8/10 Current level of pain after injections is 2/10- MUCH better with improved ROM  There was no bleeding or complications.  Patient was advised to drink a lot of water on day after injections to flush system Will have increased soreness for 12-48 hours after injections.  Can use Lidocaine patches the day AFTER injections Can use theracane on day of injections in places didn't inject Can use heating pad 4-6 hours AFTER injections  Of ntoe, pt doesn't like water- made clear, he can have flavored water, but HAS to drink no caffeine the rest of the day and drink at least 3 20 oz bottles of water by dinner time- if not, injections WON'T last.  Pt and daughter voiced understanding.   I spent a total of 40 minutes on care today doing injections.       LOS: 3 days A FACE TO FACE EVALUATION WAS PERFORMED  Louie Meaders 09/24/2020, 1:57 PM

## 2020-09-24 NOTE — Progress Notes (Signed)
Physical Therapy Session Note  Patient Details  Name: [name] [name]. MRN: [id number] Date of Birth: [birth year]  Today's Date: 09/24/2020 PT Individual Time: 2836-6294 and [id number] PT Individual Time Calculation (min): 57 min and 12 min   and  Today's Date: 09/24/2020 PT Missed Time: 18 Minutes Missed Time Reason: Patient fatigue  Short Term Goals: Week 1:  PT Short Term Goal 1 (Week 1): Pt will perform supine<>sit with min assist using same features as will have at home PT Short Term Goal 2 (Week 1): Pt will perform sit<>stand using LRAD with CGA PT Short Term Goal 3 (Week 1): Pt will perform stand pivot transfers using LRAD with CGA PT Short Term Goal 4 (Week 1): Pt will ambulate at least 37ft using LRAD with CGA  Skilled Therapeutic Interventions/Progress Updates:    Session 1: Pt received supine in bed and agreeable to therapy session. Continues to report pain in posterior neck and between his scapula - states he is hoping when the MD completes the trigger point injections he will have more pain relief. Pt declines medication administration until end of therapy session. Supine>sitting L EOB, HOB partially elevated and minimally using bedrails, via logroll technique with supervision. Pt demos some improved pain management with ability to tolerate this transition without a spike in pain. R stand pivot EOB>w/c using RW with CGA for steadying and cuing for sequencing.  Transported to/from gym in w/c for time management and energy conservation. Donned litegait harness partially in sitting and then completed in standing with B UE support on litegait and min assist for balance helping with L knee extension as pt noted to have moderate L lean with L knee maintained in flexion. Pt reports hx of many L knee injuries and believes those are contributing to the LE weakness and knee position. Gait training in litegait harness providing support for safety due to pt hx of knee buckling totaling 375ft  with 2x short standing rest breaks - thearapist faiclitating increased R lateral weight shift during stance for increased L step length and cuing for increased L knee extension during stance due to pt sustaining it in a flexed posture throughout - demos L lean throughout all of gait with minimal improvement when cued. Doffed litegait harness in sitting. L stand pivot w/c>EOM using RW with min assist for balance transferring this direction due to L lateral lean. Pt starts to report increased posterior cervical pain but agreeable to continue with session. Dynamic standing balance task using RW focusing on R lateral lean via grasping and moving horseshoes to surface on R - min assist for balance and continued cuing for increased L knee extension and R weight shift with pt having poor motor planning. R stand pivot EOM>w/c using RW with CGA/min assist for balance due to fatigue and increased pain. Transported back to room and pt agreeable to remain seated up in w/c - left with needs in reach, seat belt alarm on, and w/c reclined for improved pain management. RN notified for medication administration.  Session 2: Ed, RN reports pt received trigger point injections and is resting in the w/c, very relaxed with pain relieved and requests for therapist to assist pt with transferring back to bed. Pt received asleep in reclining w/c and upon awakening agreeable for short therapy session to assist with transitioning back to bed. Pt states this is the first time since his surgery that he has had his pain relieved. L stand pivot w/c>EOB using RW with min assist for balance  due to continued L lateral lean - cuing for sequencing. Sitting EOB doffed shoes max assist. Sit>supine via reverse logroll technique with supervision using bedrails for support. Pt able to verbalize that he needs to drink water as directed by MD. Pt left supine in bed with needs in reach and bed alarm on. Missed 18 minutes of skilled physical  therapy.  Therapy Documentation Precautions:  Precautions Precautions: Fall, Cervical, Other (comment) Precaution Comments: very painful neck with all movement; has vertebral artery stenosis Required Braces or Orthoses:  (per orders, no brace needed) Restrictions Weight Bearing Restrictions: No Other Position/Activity Restrictions: no brace needed  Pain: Session 1: Reports increased posterior cervical neck pain after mobility - during session provided rest breaks and repositioning for pain management - RN notified for medication administration at end.  Session 2: States "Oh my [name] [name] this feels so much better." after having trigger point injections  Therapy/Group: Individual Therapy  Tawana Scale , PT, DPT, CSRS  09/24/2020, 7:56 AM

## 2020-09-24 NOTE — IPOC Note (Signed)
Overall Plan of Care [facility]) Patient Details Name: [name] [name]. MRN: [id number] DOB: 1945-11-12  Admitting Diagnosis: Cervical myelopathy [facility])  Hospital Problems: Principal Problem:   Cervical myelopathy ([place])     Functional Problem List: Nursing Endurance, Medication Management, Motor, Pain, Safety, Skin Integrity  PT Balance, Safety, Behavior, Edema, Skin Integrity, Endurance, Motor, Nutrition, Pain, Perception, Sensory  OT Balance, Safety, Endurance, Motor, Pain  SLP    TR         Basic ADL's: OT Grooming, Bathing, Dressing, Toileting, Eating     Advanced  ADL's: OT Simple Meal Preparation     Transfers: PT Bed Mobility, Bed to Chair, Car, Furniture, Floor  OT Toilet, Tub/Shower     Locomotion: PT Ambulation, Stairs, Emergency planning/management officer     Additional Impairments: OT Fuctional Use of Upper Extremity  SLP        TR      Anticipated Outcomes Item Anticipated Outcome  Self Feeding Supervision  Swallowing      Basic self-care  Supervision  Toileting  Supervision   Bathroom Transfers Supervision  Bowel/Bladder  Pt remains continent of B/B  Transfers  supervision  Locomotion  supervision  Communication     Cognition     Pain  Pt reports pain level <3  Safety/Judgment  Pt demonstrates understanding of safety   Therapy Plan: PT Intensity: Minimum of 1-2 x/day ,45 to 90 minutes PT Frequency: 5 out of 7 days PT Duration Estimated Length of Stay: 1.5-2 weeks OT Intensity: Minimum of 1-2 x/day, 45 to 90 minutes OT Frequency: 5 out of 7 days OT Duration/Estimated Length of Stay: 12-14 days     Due to the current state of emergency, patients may not be receiving their 3-hours of Medicare-mandated therapy.   Team Interventions: Nursing Interventions Patient/Family Education, Disease Management/Prevention, Pain Management, Medication Management, Skin Care/Wound Management, Psychosocial Support  PT interventions Ambulation/gait training,  Community reintegration, DME/adaptive equipment instruction, Neuromuscular re-education, Psychosocial support, Stair training, UE/LE Strength taining/ROM, Wheelchair propulsion/positioning, Training and development officer, Discharge planning, Functional electrical stimulation, Pain management, Skin care/wound management, Therapeutic Activities, UE/LE Coordination activities, Cognitive remediation/compensation, Disease management/prevention, Functional mobility training, Patient/family education, Splinting/orthotics, Therapeutic Exercise, Visual/perceptual remediation/compensation  OT Interventions Balance/vestibular training, Discharge planning, Pain management, UE/LE Coordination activities, Therapeutic Activities, Self Care/advanced ADL retraining, Disease mangement/prevention, Functional mobility training, Patient/family education, Therapeutic Exercise, Community reintegration, Engineer, drilling, Neuromuscular re-education, Psychosocial support, UE/LE Strength taining/ROM, Wheelchair propulsion/positioning  SLP Interventions    TR Interventions    SW/CM Interventions Discharge Planning, Psychosocial Support, Patient/Family Education   Barriers to Discharge MD  Medical stability, Home enviroment access/loayout, Neurogenic bowel and bladder, Wound care, Lack of/limited family support, Weight, Weight bearing restrictions and needed trigger point injections  Nursing Wound Care    PT Wound Care    OT Other (comments) (uncontrolled pain)    SLP      SW       Team Discharge Planning: Destination: PT-Home ,OT- Home , SLP-  Projected Follow-up: PT-Home health PT, 24 hour supervision/assistance, OT-  Home health OT, SLP-  Projected Equipment Needs: PT-To be determined, OT- To be determined, SLP-  Equipment Details: PT- , OT-  Patient/family involved in discharge planning: PT- Patient,  OT-Patient, SLP-   MD ELOS: 1.5- 2 weeks Medical Rehab Prognosis:  Good Assessment:  Pt is a 74  yr old male with cervical myelopathy s/p posterior cervical fusion- with some bleeding afterwards- will need to see when can start lovenox.  Also has urinary retention as a  result of cervical myelopathy and severe trigger points of neck- impairing his ability to do therapy-will do injections today- helped tremendously.  Goals supervision by d/c.         See Team Conference Notes for weekly updates to the plan of care

## 2020-09-25 ENCOUNTER — Inpatient Hospital Stay (HOSPITAL_COMMUNITY): Payer: Medicare HMO | Admitting: Physical Therapy

## 2020-09-25 ENCOUNTER — Inpatient Hospital Stay (HOSPITAL_COMMUNITY): Payer: Medicare HMO | Admitting: Occupational Therapy

## 2020-09-25 LAB — BASIC METABOLIC PANEL
Anion gap: 14 (ref 5–15)
BUN: 14 mg/dL (ref 8–23)
CO2: 25 mmol/L (ref 22–32)
Calcium: 8.9 mg/dL (ref 8.9–10.3)
Chloride: 95 mmol/L — ABNORMAL LOW (ref 98–111)
Creatinine, Ser: 0.82 mg/dL (ref 0.61–1.24)
GFR, Estimated: >60 mL/min (ref >60)
Glucose, Bld: 172 mg/dL — ABNORMAL HIGH (ref 70–99)
Potassium: 3.5 mmol/L (ref 3.5–5.1)
Sodium: 134 mmol/L — ABNORMAL LOW (ref 135–145)

## 2020-09-25 LAB — URINALYSIS, ROUTINE W REFLEX MICROSCOPIC
Bilirubin Urine: NEGATIVE
Glucose, UA: NEGATIVE mg/dL
Ketones, ur: NEGATIVE mg/dL
Leukocytes,Ua: NEGATIVE
Nitrite: NEGATIVE
Protein, ur: NEGATIVE mg/dL
Specific Gravity, Urine: 1.006 (ref 1.005–1.030)
pH: 5 (ref 5.0–8.0)

## 2020-09-25 LAB — GLUCOSE, CAPILLARY
Glucose-Capillary: 174 mg/dL — ABNORMAL HIGH (ref 70–99)
Glucose-Capillary: 164 mg/dL — ABNORMAL HIGH (ref 70–99)
Glucose-Capillary: 161 mg/dL — ABNORMAL HIGH (ref 70–99)
Glucose-Capillary: 166 mg/dL — ABNORMAL HIGH (ref 70–99)

## 2020-09-25 LAB — CBC
HCT: 38.6 % — ABNORMAL LOW (ref 39.0–52.0)
Hemoglobin: 12.6 g/dL — ABNORMAL LOW (ref 13.0–17.0)
MCH: 28.8 pg (ref 26.0–34.0)
MCHC: 32.6 g/dL (ref 30.0–36.0)
MCV: 88.1 fL (ref 80.0–100.0)
Platelets: 306 10*3/uL (ref 150–400)
RBC: 4.38 MIL/uL (ref 4.22–5.81)
RDW: 13.5 % (ref 11.5–15.5)
WBC: 8.5 10*3/uL (ref 4.0–10.5)
nRBC: 0 % (ref 0.0–0.2)

## 2020-09-25 MED ORDER — CHLORHEXIDINE GLUCONATE CLOTH 2 % EX PADS
6.0000 | MEDICATED_PAD | Freq: Two times a day (BID) | CUTANEOUS | Status: DC
  Administered 2020-09-26 – 2020-10-03 (×11): 6 via TOPICAL

## 2020-09-25 MED ORDER — SULFAMETHOXAZOLE-TRIMETHOPRIM 800-160 MG PO TABS
1.0000 | ORAL_TABLET | Freq: Two times a day (BID) | ORAL | Status: DC
  Administered 2020-09-25 – 2020-09-28 (×6): 1 via ORAL
  Filled 2020-09-25 (×6): qty 1

## 2020-09-25 MED ORDER — CHLORHEXIDINE GLUCONATE CLOTH 2 % EX PADS
6.0000 | MEDICATED_PAD | Freq: Every day | CUTANEOUS | Status: DC
  Administered 2020-09-25: 6 via TOPICAL

## 2020-09-25 NOTE — Progress Notes (Signed)
Occupational Therapy Session Note  Patient Details  Name: [name] [name]. MRN: [id number] Date of Birth: [birth year]  Today's Date: 09/25/2020 OT Individual Time: 1103-1594 OT Individual Time Calculation (min): 60 min    Short Term Goals: Week 1:  OT Short Term Goal 1 (Week 1): Pt will complete a toilet transfer with 1 assist and LRAD OT Short Term Goal 2 (Week 1): Pt will complete UB dressing with Mod A OT Short Term Goal 3 (Week 1): Pt will complete 1/3 components of donning pants with supervision assist   Skilled Therapeutic Interventions/Progress Updates:    Pt greeted at time of session sitting up in wheelchair agreeable to OT session. C/o mild shoulder pain which has been typical for the pt, as well as penis discomfort from recent I&O cath. Reinforced reason for cath, encouraged to speak with RN or MD regarding concerns. Pt walked short distance from chair to sink level with Min A overall, mild L lean noted and anterior lean as welll. When standing to doff clothing and bathe, needed to have BM and walked to bathroom with Min A, transferred to toilet in same manner. Extended time on toilet d/t pt constipated and needed extended time, assist with hygiene in standing as he was in pain and very fatigued. Pt in severe pain in shoulders after toileting, stand pivot to wheelchair Min/Mod A with L lean noted. UB bathe at sink level Min A and donned shirt Mod/Max A d/t pain levels. Pt unable to tolerate sitting up, stating he had to go back to bed and lay down for relief. Stand pivot Mod A back to bed with RW and sit to supine CGA. Alarm on, call bell in reach. RN aware of pain, present at end of session to see what meds available. Attempted different positioning techniques throughout for back supports and changing degree of chair, did not help pain.    Therapy Documentation Precautions:  Precautions Precautions: Fall, Cervical, Other (comment) Precaution Comments: very painful neck with all  movement; has vertebral artery stenosis Required Braces or Orthoses:  (per orders, no brace needed) Restrictions Weight Bearing Restrictions: No Other Position/Activity Restrictions: no brace needed     Therapy/Group: Individual Therapy  Viona Gilmore 09/25/2020, 12:58 PM

## 2020-09-25 NOTE — Progress Notes (Signed)
Occupational Therapy Session Note  Patient Details  Name: [name] [name]. MRN: [id number] Date of Birth: Jun 14, 1946  Today's Date: 09/25/2020 OT Individual Time: 1300-1345 OT Individual Time Calculation (min): 45 min    Short Term Goals: Week 1:  OT Short Term Goal 1 (Week 1): Pt will complete a toilet transfer with 1 assist and LRAD OT Short Term Goal 2 (Week 1): Pt will complete UB dressing with Mod A OT Short Term Goal 3 (Week 1): Pt will complete 1/3 components of donning pants with supervision assist  Skilled Therapeutic Interventions/Progress Updates:    Patient in bed, notes significant pain in bilateral shoulders (L>R) reviewed positioning and use of pillows for support.  Completed supine scapular mobility, light massage, shoulder isometrics, shoulder AROM, lower body AROM, rolling/side lying positioning with good effort and some pain relief noted.  He remained in bed at close of session, bed alarm set and call bell in hand.    Therapy Documentation Precautions:  Precautions Precautions: Fall, Cervical, Other (comment) Precaution Comments: very painful neck with all movement; has vertebral artery stenosis Required Braces or Orthoses:  (per orders, no brace needed) Restrictions Weight Bearing Restrictions: No Other Position/Activity Restrictions: no brace needed   Therapy/Group: Individual Therapy  [name] [name] 09/25/2020, 9:46 AM

## 2020-09-25 NOTE — Progress Notes (Signed)
Physical Therapy Session Note  Patient Details  Name: [name] [name]. MRN: [id number] Date of Birth: [birth year]  Today's Date: 09/25/2020 PT Individual Time: 1000-1015; 1500-1535 PT Individual Time Calculation (min): 15 min and 35 min PT Amount of Missed Time (min): 45 Minutes PT Missed Treatment Reason: Pain  Short Term Goals: Week 1:  PT Short Term Goal 1 (Week 1): Pt will perform supine<>sit with min assist using same features as will have at home PT Short Term Goal 2 (Week 1): Pt will perform sit<>stand using LRAD with CGA PT Short Term Goal 3 (Week 1): Pt will perform stand pivot transfers using LRAD with CGA PT Short Term Goal 4 (Week 1): Pt will ambulate at least 26ft using LRAD with CGA  Skilled Therapeutic Interventions/Progress Updates:    Session 1: Pt received seated in bed, reports significant pain in upper back/trap area so much so that he can't even raise his arms to wash his face. Pt also reports frustration with ongoing I/O caths and frequency of cathing. Nursing aware of pt complaints of pain and frustration with I/O caths. Pt reports being premedicated prior to start of therapy session. Agreeable to attempt hot pack for pain management. Placed moist hot pack in upper back region while pt semi-reclined in bed with instructions to press call button if hot pack becomes uncomfortable. This therapist returned after 15 min to assess pt response to hot pack and remove hot pack. Pt reports some decrease in pain, no irritation noted to skin, but pt still declines any OOB mobility due to pain with mobility this date. Pt missed 45 min of scheduled therapy session this date due to pain.   Session 2: Pt received seated in bed, agreeable to PT session. Pt reports pain is greatly improved from AM session. Semi-reclined in bed to sitting EOB with min A for trunk elevation. Sit to stand with min A to RW x 4 reps. Standing mini-squats 2 x 10 reps with RW and CGA for balance for BLE  strengthening. Sidesteps L/R 2 x 5 ft each direction with RW and CGA for balance. Pt exhibits decreased stability and coordination when side-stepping to the L with some L lateral lean noted. Pt returned to supine with min A for LLE management. Assisted pt with emptying foley bag at end of session, see Flowsheet for details. Pt left seated in bed with needs in reach, bed alarm in place at end of session.  Therapy Documentation Precautions:  Precautions Precautions: Fall, Cervical, Other (comment) Precaution Comments: very painful neck with all movement; has vertebral artery stenosis Required Braces or Orthoses:  (per orders, no brace needed) Restrictions Weight Bearing Restrictions: No Other Position/Activity Restrictions: no brace needed General: PT Amount of Missed Time (min): 45 Minutes PT Missed Treatment Reason: Pain    Therapy/Group: Individual Therapy   Excell Seltzer, PT, DPT  09/25/2020, 10:35 AM

## 2020-09-25 NOTE — Progress Notes (Addendum)
[facility] PHYSICAL MEDICINE & REHABILITATION PROGRESS NOTE   Subjective/Complaints:   Also has c/o severe pain when sitting in WC Severe pain with cathing , pt refusing I/O caths, bladder scan is 564ml  Last cath at 2am for 1355ml   ROS:  Pt denies SOB, abd pain, CP, N/V/C/D, and vision changes   Objective:   No results found. Recent Labs    09/25/20 0711  WBC 8.5  HGB 12.6*  HCT 38.6*  PLT 306   Recent Labs    09/25/20 0711  NA 134*  K 3.5  CL 95*  CO2 25  GLUCOSE 172*  BUN 14  CREATININE 0.82  CALCIUM 8.9    Intake/Output Summary (Last 24 hours) at 09/25/2020 1006 Last data filed at 09/25/2020 2694 Gross per 24 hour  Intake 480 ml  Output 2050 ml  Net -1570 ml        Physical Exam: Vital Signs Blood pressure 136/82, pulse 78, temperature 97.6 F (36.4 C), resp. rate 17, height 5\' 9"  (1.753 m), weight 102 kg, SpO2 99 %.  General: No acute distress Mood and affect are appropriate Heart: Regular rate and rhythm no rubs murmurs or extra sounds Lungs: Clear to auscultation, breathing unlabored, no rales or wheezes Abdomen: Positive bowel sounds, soft nontender to palpation, nondistended Extremities: No clubbing, cyanosis, or edema Skin: No evidence of breakdown, no evidence of rash   Musculoskeletal:  Comments: Well healed old left knee surgical scars. Left fore foot and great toe with mild erythema--minimal tenderness to touch.  UEs- at least 4/5 in UE's and LE's- due to neck incision, pt asked for no sig. Resistance to be used. Appears equal except L ankle from gout L ankle DF/PF 3-4/5- due to gout Skin: no skin breakdown on heels B/L IV's out Neurological:  Mental Status: HOx3  Comments: Mild left facial weakness with dysphonia noted. Left sided weakness with sensory deficits. Psychiatric:  Comments: sad/depressed affect due to caths/pain      Assessment/Plan: 1. Functional deficits which require 3+ hours per day of  interdisciplinary therapy in a comprehensive inpatient rehab setting.  Physiatrist is providing close team supervision and 24 hour management of active medical problems listed below.  Physiatrist and rehab team continue to assess barriers to discharge/monitor patient progress toward functional and medical goals  Care Tool:  Bathing    Body parts bathed by patient: Face, Front perineal area, Right upper leg, Left upper leg   Body parts bathed by helper: Right arm, Left arm, Chest, Abdomen, Buttocks, Right lower leg, Left lower leg     Bathing assist Assist Level: Maximal Assistance - Patient 24 - 49%     Upper Body Dressing/Undressing Upper body dressing   What is the patient wearing?: Pull over shirt    Upper body assist Assist Level: Minimal Assistance - Patient > 75%    Lower Body Dressing/Undressing Lower body dressing      What is the patient wearing?: Pants     Lower body assist Assist for lower body dressing: Moderate Assistance - Patient 50 - 74%     Toileting Toileting Toileting Activity did not occur (Clothing management and hygiene only): N/A (no void or bm)  Toileting assist Assist for toileting: Moderate Assistance - Patient 50 - 74% Assistive Device Comment: urinal   Transfers Chair/bed transfer  Transfers assist     Chair/bed transfer assist level: Minimal Assistance - Patient > 75% Chair/bed transfer assistive device: Programmer, multimedia   Ambulation assist  Assist level: Minimal Assistance - Patient > 75% Assistive device: Lite Gait Max distance: 33ft   Walk 10 feet activity   Assist     Assist level: Minimal Assistance - Patient > 75% Assistive device: Lite Gait   Walk 50 feet activity   Assist Walk 50 feet with 2 turns activity did not occur: Safety/medical concerns  Assist level: Minimal Assistance - Patient > 75% Assistive device: Lite Gait    Walk 150 feet activity   Assist Walk 150 feet activity  did not occur: Safety/medical concerns  Assist level: Minimal Assistance - Patient > 75% Assistive device: Lite Gait    Walk 10 feet on uneven surface  activity   Assist Walk 10 feet on uneven surfaces activity did not occur: Safety/medical concerns   Assist level: Moderate Assistance - Patient - 50 - 74% Assistive device: Aeronautical engineer Will patient use wheelchair at discharge?:  (TBD but do not anticipate) Type of Wheelchair: Manual    Wheelchair assist level: Supervision/Verbal cueing Max wheelchair distance: 50'    Wheelchair 50 feet with 2 turns activity    Assist        Assist Level: Supervision/Verbal cueing   Wheelchair 150 feet activity     Assist  Wheelchair 150 feet activity did not occur: Safety/medical concerns       Blood pressure 136/82, pulse 78, temperature 97.6 F (36.4 C), resp. rate 17, height 5\' 9"  (1.753 m), weight 102 kg, SpO2 99 %.  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 -patient may Shower once doesn't need pressure dressing on posterior neck -ELOS/Goals: 10-12 days- Supervision to mod I 2. Antithrombotics: -DVT/anticoagulation:Mechanical:Sequential compression devices, below kneeBilateral lower extremities - will see, once bleeding stops from cervical incision, when can start Lovenox -antiplatelet therapy: N/A 3. Pain Management:Continue Neurontin TID with Oxycodone prn  11/12- will add flexeril scheduled for pain/spasms- if need be, will do trigger point injections this weekend.  11/13- will try valium 5 mg BID - if not helpful, do TrP injections Had TPI 11/14  4. Mood:LCSW to follow for evaluation and support. -antipsychotic agents: N/A 5. Neuropsych: This patientiscapable of making decisions on hisown behalf. 6. Skin/Wound Care:Routine  pressure relief measures. Monitor wound for healing. 7. Fluids/Electrolytes/Nutrition:Monitor I/O. Appetite has been good. Check BMET in am.  8. HTN: Monitor BP tid--continue Toprol XL daily  11/12- BP well controlled- con't regimen 9. Gout flare: Continue colchicine bid--symptoms improving.Don't think will need Steroids  11/12- said much improved- no steroids required  11/14- resolved 10. T2DM: Will monitor BS ac/hs. BS poorly controlled--resume metformin. Continue to hold glucotrol. Will use SSI for elevated BS  11/12- jist restarted Metformin- BGs 160s-226- will give 1-2 days and see if better- then restart glucotrol.   11/13- a little better- max is 200- will add glucotrol tomorrow if not better   CBG (last 3)  Recent Labs    09/24/20 1651 09/24/20 2054 09/25/20 0604  GLUCAP 204* 183* 174*  will increase metformin to 850mg  BID  11. Hyponatremia: Recheck labs in am.  12. Constipation: Will add Senna S.Might need sorbitol- no BM since last Friday.  11/12- LBM yesterday- con't regimen  11/13- LBM 2 days ago- if no BM by tomorrow, will try Sorbitol.  13. Neurogenic bladder vs outlet obstruction with  urinary retention- need to check into this.Augment bowel program as needed.   11/13- requiring in and out caths due to  PVRs >350cc- con't for now- max out flomax. Will help him retain LESS- will tell nursing.  11/13 flomax increased to 0.8mg  - monitor for orthostatic changes, thus far no sig effect  Given severe pain with caths and refusal of I/O caths will replace foley and repeat voiding trial later in week with lidocaine jelly  Has tenderness over the perineum but no swelling or erythema  , pain with caths, urinary retention, may have prostatis , check UA C and S , Start empiric bactrim DS when UA Cx obtained.          LOS: 4 days A FACE TO FACE EVALUATION WAS PERFORMED  Charlett Blake 09/25/2020, 10:06 AM

## 2020-09-25 NOTE — Progress Notes (Signed)
Patient voided 225, PVR 598. Patient still refusing I/O cath. Foley cath has been ordered by Network engineer. Will insert upon arrival to unit

## 2020-09-25 NOTE — Progress Notes (Signed)
Pt scanned for 41mls of urine in bladder. This nurse encouraged pt to void. Pt very angrily states " I am not going to go to the bathroom, and I am tired of being castrated. As far as I am concerned it can stay in there". Pt also states "I will drink more water" Pt is instructed on the purpose of I&O caths and that drinking water is not the solution. Pt verbalizes understanding, but continues to be unwilling to be cathed.

## 2020-09-26 ENCOUNTER — Inpatient Hospital Stay (HOSPITAL_COMMUNITY): Payer: Medicare HMO | Admitting: Occupational Therapy

## 2020-09-26 ENCOUNTER — Inpatient Hospital Stay (HOSPITAL_COMMUNITY): Payer: Medicare HMO

## 2020-09-26 LAB — GLUCOSE, CAPILLARY
Glucose-Capillary: 160 mg/dL — ABNORMAL HIGH (ref 70–99)
Glucose-Capillary: 138 mg/dL — ABNORMAL HIGH (ref 70–99)
Glucose-Capillary: 203 mg/dL — ABNORMAL HIGH (ref 70–99)
Glucose-Capillary: 178 mg/dL — ABNORMAL HIGH (ref 70–99)

## 2020-09-26 LAB — URINE CULTURE: Culture: NO GROWTH

## 2020-09-26 MED ORDER — LIDOCAINE HCL 1 % IJ SOLN
10.0000 mL | Freq: Once | INTRAMUSCULAR | Status: AC
  Administered 2020-09-26: 10 mL via INTRADERMAL
  Filled 2020-09-26: qty 10

## 2020-09-26 MED ORDER — ENOXAPARIN SODIUM 40 MG/0.4ML ~~LOC~~ SOLN
40.0000 mg | Freq: Every day | SUBCUTANEOUS | Status: DC
  Administered 2020-09-26 – 2020-10-03 (×8): 40 mg via SUBCUTANEOUS
  Filled 2020-09-26 (×8): qty 0.4

## 2020-09-26 MED ORDER — DOCUSATE SODIUM 100 MG PO CAPS
100.0000 mg | ORAL_CAPSULE | Freq: Three times a day (TID) | ORAL | Status: DC
  Administered 2020-09-26 – 2020-10-03 (×21): 100 mg via ORAL
  Filled 2020-09-26 (×21): qty 1

## 2020-09-26 NOTE — Progress Notes (Signed)
Occupational Therapy Session Note  Patient Details  Name: [name] [name]. MRN: [id number] Date of Birth: 17-Oct-1946  Today's Date: 09/26/2020 OT Individual Time: 6256-3893 and 7342-8768 OT Individual Time Calculation (min): 58 min and 54 min   Short Term Goals: Week 1:  OT Short Term Goal 1 (Week 1): Pt will complete a toilet transfer with 1 assist and LRAD OT Short Term Goal 2 (Week 1): Pt will complete UB dressing with Mod A OT Short Term Goal 3 (Week 1): Pt will complete 1/3 components of donning pants with supervision assist   Skilled Therapeutic Interventions/Progress Updates:    Pt greeted at time of session sitting up in wheelchair finishing up meeting with MD, agreeable to OT session, no c/o pain in resting and no movement. C/o lightheadedness at beginning of session, BP 111/69 in sitting and dropped to 88/64 in static standing and RN aware. Lightheadedness resolved and agreeable to continue OT session. Declined ADL this am stating that d/t Foley care pt needed to get washed up earlier this am and nursing staff assisted. Did not need to change clothes or use bathroom. Brought to gym via wheelchair for time management and performed numerous BUE ROM activities including AROM, AAROM, and towel slides all within pain tolerance. Primary movements focused on shoulder flexion/extension, abduction/adduction, horizontal abd/adduction, and shoulder circles. Moist head applied to L shoulder at site of discomfort (no number given) after activity for 5 minutes, skin intact pre and post moist heat, pt stating heat did help feel better. Transported back to room in same manner as above, alarm on call bell in reach.   Session 2: Pt greeted at time of sessoin sitting up in wheelchair agreeable to OT session, no c/o pain during rest initially. Discussed DC date as set at conference and pt agreeable thinking this should be a good time frame for him. Transported to gym via wheelchair for time management,  walked approx 15 feet to gym mat with CGA with Min A during turn d/t mild L lean with RW. Once seated on mat, pt did have some upper back/trap pain after approx 2-3 minutes of sitting EOM, relieved when leaning against a surface to relax shoulders. Performed numerous AROM and AAROM movements for BUEs including shoulder elevation/retraction, shoulder circles, scapular retractions, and shoulder flexion/extension within his tolerance. Pt did have a few instances where pain would kick in with movement, but subsided after rest breaks. However, after sitting EOM approx 15 minutes pt requesting to go back to chair, stand pivot with Min A with RW. BP checked and 129/67. Discomfort in shoulders subsided with rest and back support. Less tension noted today after trigger point injection. Transported back to room in same manner, alarm on call bell in reach.   Therapy Documentation Precautions:  Precautions Precautions: Fall, Cervical, Other (comment) Precaution Comments: very painful neck with all movement; has vertebral artery stenosis Required Braces or Orthoses:  (per orders, no brace needed) Restrictions Weight Bearing Restrictions: No Other Position/Activity Restrictions: no brace needed     Therapy/Group: Individual Therapy  Viona Gilmore 09/26/2020, 12:14 PM

## 2020-09-26 NOTE — Progress Notes (Addendum)
[facility] PHYSICAL MEDICINE & REHABILITATION PROGRESS NOTE   Subjective/Complaints:    Pt had foley put in yesterday because caths so painful- Will need Urology f/u at d/c.  Wasn't voiding, or emptying at least even on Flomax 0.8 mg daily.   Pain still in upper traps/neck area- L>R shoulder. Feeling a whole lot better, but not resolved yet.   LBM yesterday.   Also feels lightheaded, but BP ok- no dizziness.   Asked for pain meds at 3am- didn't get til 6am, so got behind on pain- previous dose was 11pm.   Bowels hard when poops.    ROS:  Pt denies SOB, abd pain, CP, N/V/C/D, and vision changes   Objective:   No results found. Recent Labs    09/25/20 0711  WBC 8.5  HGB 12.6*  HCT 38.6*  PLT 306   Recent Labs    09/25/20 0711  NA 134*  K 3.5  CL 95*  CO2 25  GLUCOSE 172*  BUN 14  CREATININE 0.82  CALCIUM 8.9    Intake/Output Summary (Last 24 hours) at 09/26/2020 1007 Last data filed at 09/26/2020 0538 Gross per 24 hour  Intake 220 ml  Output 4100 ml  Net -3880 ml        Physical Exam: Vital Signs Blood pressure 129/84, pulse 93, temperature 97.7 F (36.5 C), temperature source Oral, resp. rate 16, height 5\' 9"  (1.753 m), weight 102 kg, SpO2 98 %.  General: No acute distress- sitting up in bedside chair, wincing with exam, NAD Mood and affect are appropriate- slightly flat Heart: RRR Lungs: CTA B/L- no W/R/R- good air movement Abdomen: Soft, NT, ND, (+)BS - protuberant Extremities: No clubbing, cyanosis, or edema Skin: No evidence of breakdown, no evidence of rash   Musculoskeletal:  Comments: Well healed old left knee surgical scars. Left fore foot and great toe with mild erythema--minimal tenderness to touch.  UEs- at least 4/5 in UE's and LE's- due to neck incision, pt asked for no sig. Resistance to be used. Appears equal except L ankle from gout Very TTP over L upper trap, levators, and some in scalenes - all on L- not exact areas I  already injected, but close by.  L ankle DF/PF 3-4/5- due to gout Skin: no skin breakdown on heels B/L IV's out Neurological:  Mental Status: HOx3  Comments: Mild left facial weakness with dysphonia noted. Left sided weakness with sensory deficits. Psychiatric:  Comments: sad/flat affect due to pain- but better      Assessment/Plan: 1. Functional deficits which require 3+ hours per day of interdisciplinary therapy in a comprehensive inpatient rehab setting.  Physiatrist is providing close team supervision and 24 hour management of active medical problems listed below.  Physiatrist and rehab team continue to assess barriers to discharge/monitor patient progress toward functional and medical goals  Care Tool:  Bathing    Body parts bathed by patient: Face, Right arm, Left arm, Chest, Abdomen   Body parts bathed by helper: Right arm, Left arm, Chest, Abdomen, Buttocks, Right lower leg, Left lower leg     Bathing assist Assist Level: Minimal Assistance - Patient > 75% (Min A for UB, would be Max for LB)     Upper Body Dressing/Undressing Upper body dressing   What is the patient wearing?: Pull over shirt    Upper body assist Assist Level: Maximal Assistance - Patient 25 - 49% (limited by pain)    Lower Body Dressing/Undressing Lower body dressing  What is the patient wearing?: Pants     Lower body assist Assist for lower body dressing: Moderate Assistance - Patient 50 - 74%     Toileting Toileting Toileting Activity did not occur (Clothing management and hygiene only): N/A (no void or bm)  Toileting assist Assist for toileting: Moderate Assistance - Patient 50 - 74% Assistive Device Comment: urinal   Transfers Chair/bed transfer  Transfers assist     Chair/bed transfer assist level: Minimal Assistance - Patient > 75% Chair/bed transfer assistive device: Programmer, multimedia   Ambulation assist      Assist level: Minimal  Assistance - Patient > 75% Assistive device: Lite Gait Max distance: 343ft   Walk 10 feet activity   Assist     Assist level: Minimal Assistance - Patient > 75% Assistive device: Lite Gait   Walk 50 feet activity   Assist Walk 50 feet with 2 turns activity did not occur: Safety/medical concerns  Assist level: Minimal Assistance - Patient > 75% Assistive device: Lite Gait    Walk 150 feet activity   Assist Walk 150 feet activity did not occur: Safety/medical concerns  Assist level: Minimal Assistance - Patient > 75% Assistive device: Lite Gait    Walk 10 feet on uneven surface  activity   Assist Walk 10 feet on uneven surfaces activity did not occur: Safety/medical concerns   Assist level: Moderate Assistance - Patient - 50 - 74% Assistive device: Aeronautical engineer Will patient use wheelchair at discharge?:  (TBD but do not anticipate) Type of Wheelchair: Manual    Wheelchair assist level: Supervision/Verbal cueing Max wheelchair distance: 65'    Wheelchair 50 feet with 2 turns activity    Assist        Assist Level: Supervision/Verbal cueing   Wheelchair 150 feet activity     Assist  Wheelchair 150 feet activity did not occur: Safety/medical concerns       Blood pressure 129/84, pulse 93, temperature 97.7 F (36.5 C), temperature source Oral, resp. rate 16, height 5\' 9"  (1.753 m), weight 102 kg, SpO2 98 %.  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 -patient may Shower once doesn't need pressure dressing on posterior neck -ELOS/Goals: 10-12 days- Supervision to mod I 2. Antithrombotics: -DVT/anticoagulation:Mechanical:Sequential compression devices, below kneeBilateral lower extremities - will see, once bleeding stops from cervical incision, when can start Lovenox 11/16- Surgery was 11/7- has  been 9 days- at high risk for DVT_ will start Lovenox. Called [facility]- didn't hear back so far. -antiplatelet therapy: N/A 3. Pain Management:Continue Neurontin TID with Oxycodone prn  11/12- will add flexeril scheduled for pain/spasms- if need be, will do trigger point injections this weekend.  11/13- will try valium 5 mg BID - if not helpful, do TrP injections Had TPI 11/14  11/16- Trigger point injections again today- still very tight/painful 4. Mood:LCSW to follow for evaluation and support. -antipsychotic agents: N/A 5. Neuropsych: This patientiscapable of making decisions on hisown behalf. 6. Skin/Wound Care:Routine pressure relief measures. Monitor wound for healing. 7. Fluids/Electrolytes/Nutrition:Monitor I/O. Appetite has been good. Check BMET in am.  8. HTN: Monitor BP tid--continue Toprol XL daily  11/12- BP well controlled- con't regimen 9. Gout flare: Continue colchicine bid--symptoms improving.Don't think will need Steroids  11/12- said much improved- no steroids required  11/14- resolved 10. T2DM: Will monitor BS ac/hs. BS poorly controlled--resume metformin. Continue to hold glucotrol. Will use  SSI for elevated BS  11/12- jist restarted Metformin- BGs 160s-226- will give 1-2 days and see if better- then restart glucotrol.   11/13- a little better- max is 200- will add glucotrol tomorrow if not better   CBG (last 3)  Recent Labs    09/25/20 1712 09/25/20 2103 09/26/20 0607  GLUCAP 161* 166* 160*   11/16- BGs 160s- con't regimen- might need higher metformin dose  11. Hyponatremia: Recheck labs in am.  12. Constipation: Will add Senna S.Might need sorbitol- no BM since last Friday.  11/12- LBM yesterday- con't regimen  11/13- LBM 2 days ago- if no BM by tomorrow, will try Sorbitol.   11/16- LBM yesterday but hard- will increase Colace to TID since still hard balls.  13. Neurogenic bladder vs outlet obstruction with  urinary  retention- need to check into this.Augment bowel program as needed.   11/13- requiring in and out caths due to PVRs >350cc- con't for now- max out flomax. Will help him retain LESS- will tell nursing.  11/13 flomax increased to 0.8mg  - monitor for orthostatic changes, thus far no sig effect  11/16- pt insisted that foley be placed yesterday- since caths so painful, even with lidocaine- will either remove later in week OR f/u with Urology after d/c- so far, U/A looks (-) for UTI.   Given severe pain with caths and refusal of I/O caths will replace foley and repeat voiding trial later in week with lidocaine jelly  Has tenderness over the perineum but no swelling or erythema  , pain with caths, urinary retention, may have prostatis , check UA C and S , Start empiric bactrim DS when UA Cx obtained.    Patient here for trigger point injections for  Consent done and on chart.  Cleaned areas with alcohol and injected using a 27 gauge 1.5 inch needle  Injected 3cc Using 1% Lidocaine with no EPI  Upper traps L side x2 Levators L only Posterior scalenes L only x2 Middle scalenes Splenius Capitus L side x2 Pectoralis Major Rhomboids Infraspinatus Teres Major/minor Thoracic paraspinals Lumbar paraspinals Other injections-    Patient's level of pain prior was "bad" Current level of pain after injections is- "Almost gone"  There was no bleeding or complications.  Patient was advised to drink a lot of water on day after injections to flush system Will have increased soreness for 12-48 hours after injections.  Can use Lidocaine patches the day AFTER injections Can use theracane on day of injections in places didn't inject Can use heating pad 4-6 hours AFTER injections     I spent a total of 45 minutes on care today, with seeing pt twice and doing trigger point injections and calling NSU.     LOS: 5 days A FACE TO FACE EVALUATION WAS PERFORMED  Shaylynne Lunt 09/26/2020, 10:07 AM

## 2020-09-26 NOTE — Progress Notes (Signed)
Patient ID: [name] [name]., male   DOB: 24-Nov-1945, 74 y.o.   MRN: [id number] Team Conference Report to Patient/Family  Team Conference discussion was reviewed with the patient and caregiver, including goals, any changes in plan of care and target discharge date.  Patient and caregiver express understanding and are in agreement.  The patient has a target discharge date of 10/03/20.  Dyanne Iha 09/26/2020, 1:19 PM

## 2020-09-26 NOTE — Progress Notes (Signed)
Physical Therapy Session Note  Patient Details  Name: [name] [name]. MRN: [id number] Date of Birth: 1946/01/07  Today's Date: 09/26/2020 PT Individual Time:Session1: 1030-1130  Session2:1530-1600 PT Individual Time Calculation (min): 60 min & 30 min  Short Term Goals: Week 1:  PT Short Term Goal 1 (Week 1): Pt will perform supine<>sit with min assist using same features as will have at home PT Short Term Goal 2 (Week 1): Pt will perform sit<>stand using LRAD with CGA PT Short Term Goal 3 (Week 1): Pt will perform stand pivot transfers using LRAD with CGA PT Short Term Goal 4 (Week 1): Pt will ambulate at least 67ft using LRAD with CGA  Skilled Therapeutic Interventions/Progress Updates:    Session1:  Patient in w/c in room.  Reports walked a lot with lite gait yesterday.  Pushed in w/c to therapy gym.  Sit to stand with CGA.  Negotiated 8 3" steps with rails and min to mod A.  Patient ambulated with RW and min to mod A due to L lateral lean x 150' x 2.  Standing balance activity x 1 min then 2 min at [employer] for reaction time with min a for balance with 1 UE support.  Patient assisted in w/c to room and left seated with call bell in reach and alarm belt activated.   Session2: Patient in w/c in room.  Reports had trigger point injections per MD earlier this pm.  States should be better tomorrow.  Patient assisted to dayroom in reclining w/c.  Sit to stand and ambulated with min A with RW to mat x 15'.  Sit to stand with L forced use for strengthening x 5 reps min to mod A, L foot on 4" step.  Patient sit to R sidelying mod A for LE's.  Performed 2 x 10 reps clamshell hip abduction.  Patient side to sit min A.  C/o increased tension and spasm L upper trap.  Assisted to w/c with RW min A.  Seated for kinesiotape "I" strip to L upper trap I-O w/muscle on stretch (off the paper tension) for inhibiting muscle.  Patient assisted to room in w/c and left with call bell in reach and alarm belt  active.   Therapy Documentation Precautions:  Precautions Precautions: Fall, Cervical, Other (comment) Precaution Comments: very painful neck with all movement; has vertebral artery stenosis Required Braces or Orthoses:  (per orders, no brace needed) Restrictions Weight Bearing Restrictions: No Other Position/Activity Restrictions: no brace needed Pain: Pain Assessment Pain Scale: 0-10 Pain Score: 7     Therapy/Group: Individual Therapy  Reginia Naas  Magda Kiel, PT 09/26/2020, 5:07 PM

## 2020-09-26 NOTE — Patient Care Conference (Signed)
Inpatient RehabilitationTeam Conference and Plan of Care Update Date: 09/26/2020   Time: 11:50 AM    Patient Name: [name] [name].      Medical Record Number: [id number]  Date of Birth: 1945-11-16 Sex: Male         Room/Bed: 4W11C/4W11C-01 Payor Info: Payor: [employer] MEDICARE / Plan: [employer] MEDICARE HMO/PPO / Product Type: *No Product type* /    Admit Date/Time:  09/21/2020 10:35 PM  Primary Diagnosis:  Cervical myelopathy [facility])  Hospital Problems: Principal Problem:   Cervical myelopathy [facility])    Expected Discharge Date: Expected Discharge Date: 10/03/20  Team Members Present: Physician leading conference: Dr. Courtney Heys Care Coodinator Present: Dorthula Nettles, RN, BSN, CRRN;Christina [place], [place] Nurse Present: Serena Croissant, LPN PT Present: Magda Kiel, PT OT Present: Lillia Corporal, OT PPS Coordinator present : Ileana Ladd, Burna Mortimer, SLP     Current Status/Progress Goal Weekly Team Focus  Bowel/Bladder   Pt is continent of bowel; LBM-11/15. Foley was inserted 11/15 on dayshift due to urinary retention and pt refusing in/out caths.  To have foley removed for voiding trial and for pt to be able to void on his own w/ no urinary retention.  Assess tolieting needs often.   Swallow/Nutrition/ Hydration             ADL's   Mod/Max UB dress, LB ADLs Max A, toilet transfer Min-Mod ambulating, Max A clothing management, very limited by pain in shoulders  Supervisoin  ADL retraining, pain management, ADL transfers, AE as needed, positioning, standing balance and tolerance   Mobility   min A transfers, mod A gait x 250' RW (one episode L knee buckling), 8 steps bilat rails min to mod A, bed mobility min A, sitting balance S short tolerance  supervision  balance, standing tolerance, gait, stairs, activity tolerance, L LE strength   Communication             Safety/Cognition/ Behavioral Observations            Pain   Pt has complaints of pain in left shoulder, neck.  He can get prn oxy if needed.  To decrease pain level 2/10.  Assess pain q shift or prn.   Skin   Surgical incision to upper posterior part of neck/head OTA.  Promote healing and preventing skin breakdown from occurring.  Assess skin q shift or prn.     Discharge Planning:  Discharging home with spouse (spouse can provide: supervision, Chilfren can provide: Min A) Has: Cane and rolling walker. 1 level home, level entry and walk-in shower   Team Discussion: Foley place d/t patient refusing I&O caths. Stools hard, MD increased Colace. OT reports patient's pain level as left > right, ambulated to the bathroom with min assist, has a lean to the left. As patient reaches pain increases. PT reports patient is a min assist for transfers, min assist for gait of 250', 8 steps with rails, can not however, sit unsupported for long periods of time. Goals are set at supervision. Going home with wife and children. Patient on target to meet rehab goals: yes  *See Care Plan and progress notes for long and short-term goals.   Revisions to Treatment Plan:  MD plans to do trigger point injections for pain today.  Teaching Needs: Continue with family education.  Current Barriers to Discharge: Inaccessible home environment, Decreased caregiver support, Medical stability, Neurogenic bowel and bladder, Wound care, Lack of/limited family support, Weight, Weight bearing restrictions, Medication compliance and pain.  Possible Resolutions to Barriers: Please educate on wound care and dressing changes as ordered, foley care, bowel program, safety awareness, weight bearing restrictions. Continue with current medications for pain and bowels.     Medical Summary Current Status: started lovenox for DVT prophylaixs; foley 2nd to retention- refusing caths now- skin OK; trigger point injections today again  Barriers to Discharge: Decreased family/caregiver support;Home enviroment access/layout;Medical  stability;Weight;Weight bearing restrictions;Wound care;Other (comments)  Barriers to Discharge Comments: pain- doing trigger point injections for it today Possible Resolutions to Barriers/Weekly Focus: foley; BM yesterday- increased colace due to hard stools; pain is biggest limitator; max A UB/LB due to pain; knee buckling occ with walking- leans to L- d/c 11/23- if CAN, might need to do after thanksgiving- orthostatic hypotension today- cause of dizziness.   Continued Need for Acute Rehabilitation Level of Care: The patient requires daily medical management by a physician with specialized training in physical medicine and rehabilitation for the following reasons: Direction of a multidisciplinary physical rehabilitation program to maximize functional independence : Yes Medical management of patient stability for increased activity during participation in an intensive rehabilitation regime.: Yes Analysis of laboratory values and/or radiology reports with any subsequent need for medication adjustment and/or medical intervention. : Yes   I attest that I was present, lead the team conference, and concur with the assessment and plan of the team.   Cristi Loron 09/26/2020, 5:39 PM

## 2020-09-27 ENCOUNTER — Inpatient Hospital Stay (HOSPITAL_COMMUNITY): Payer: Medicare HMO | Admitting: Occupational Therapy

## 2020-09-27 ENCOUNTER — Inpatient Hospital Stay (HOSPITAL_COMMUNITY): Payer: Medicare HMO

## 2020-09-27 LAB — GLUCOSE, CAPILLARY
Glucose-Capillary: 148 mg/dL — ABNORMAL HIGH (ref 70–99)
Glucose-Capillary: 159 mg/dL — ABNORMAL HIGH (ref 70–99)
Glucose-Capillary: 147 mg/dL — ABNORMAL HIGH (ref 70–99)
Glucose-Capillary: 197 mg/dL — ABNORMAL HIGH (ref 70–99)

## 2020-09-27 NOTE — Progress Notes (Signed)
Occupational Therapy Session Note  Patient Details  Name: [name] [name]. MRN: [id number] Date of Birth: 01/03/1946  Today's Date: 09/27/2020 OT Individual Time: 9449-6759 and 1638-4665 OT Individual Time Calculation (min): 61 min and 70 min   Short Term Goals: Week 1:  OT Short Term Goal 1 (Week 1): Pt will complete a toilet transfer with 1 assist and LRAD OT Short Term Goal 2 (Week 1): Pt will complete UB dressing with Mod A OT Short Term Goal 3 (Week 1): Pt will complete 1/3 components of donning pants with supervision assist   Skilled Therapeutic Interventions/Progress Updates:    session 1: Pt greeted at time of session sitting up in wheelchair agreeable to OT session, no c/o pain resting, stating that he did request to get up this am to eat breakfast in chair for better posture and upright sitting to access food, and had better results. Pt agreeable to ADL and set up at sink level for bathing, performed UB bathing with Supervision, much improved from previous sessions when limited by pain and Mod for LB bathing for buttocks and pt declined washing BLEs past knee but would have needed assist. Plan to give LHS in future session. Pt with good adherence to cervical precautions. UB dress set up/supervision which is an improvement, no pain noted, and LB dress Min/Mod A with assist to thread Foley and L foot but pt able to don over hips in standing. Pt fatigued, but no reports of pain. Rest breaks provided throughout session. Set up with alarm on, call bell in reach.   Session 2: Pt greeted at time of session sitting up in wheelchair agreeable to OT session, no pain resting. Transported to therapy gym via wheelchair total A for time management, ambulated approx 10 feet to mat with CGA and RW. Performed rebounder 1x20 seated for warm up, 2x30 standing with no UE support and throwing small red 2kg ball, noted to drop several times and improved when wearing latex gloves. Dynamic squatting activity  for reaching for cones on ground level, too low, and graded activity to elevate approx 6-8 inches from ground surface on block and pt and able to reach for NMR and weight bearing, L lean noted and cues to correct. Some discomfort noted in shoulders L>R and provided rest break with shoulder circles and scap raises. Stand pivot back to wheelchair with RW CGA. D/t noted dropping of ball during activity, performed 9 hole peg and pt scored similar in both hands, 34 sec L and 36 sec R. Transported total A via wheelchair outside for fresh air and uplifting mood, pt expressed he enjoyed being outside. Transported back to room same manner, alarm on and call bell in reach.    Therapy Documentation Precautions:  Precautions Precautions: Fall, Cervical, Other (comment) Precaution Comments: very painful neck with all movement; has vertebral artery stenosis Required Braces or Orthoses:  (per orders, no brace needed) Restrictions Weight Bearing Restrictions: No Other Position/Activity Restrictions: no brace needed     Therapy/Group: Individual Therapy  Viona Gilmore 09/27/2020, 1:04 PM

## 2020-09-27 NOTE — Progress Notes (Addendum)
Nurse was called to room, pt reports sharp pain in corner of left eye, said that came on out of blue and went away, also reports that tongue feels hot. VS documented. Will recheck bp to see if comes down due to pain decrease. No changes to assessment at this time, denies SOB, or chest pain and continues to have left sided weakness   Checked back on pt, no pain at this point but reports that just feels funny and numb'ish. Still no other symptoms, will recheck b/p, notified Dan of concern, will apply warm compress to area of pain, continue plan of care.

## 2020-09-27 NOTE — Progress Notes (Signed)
Physical Therapy Session Note  Patient Details  Name: [name] [name]. MRN: [id number] Date of Birth: Sep 24, 1946  Today's Date: 09/27/2020 PT Individual Time: 5929-2446 PT Individual Time Calculation (min): 72 min   Short Term Goals: Week 1:  PT Short Term Goal 1 (Week 1): Pt will perform supine<>sit with min assist using same features as will have at home PT Short Term Goal 2 (Week 1): Pt will perform sit<>stand using LRAD with CGA PT Short Term Goal 3 (Week 1): Pt will perform stand pivot transfers using LRAD with CGA PT Short Term Goal 4 (Week 1): Pt will ambulate at least 37ft using LRAD with CGA  Skilled Therapeutic Interventions/Progress Updates:    Patient reports slept well and able to get pain relief after injections yesterday.  Still has a lump in his bed.  Noted sensor under mattress and removed to reduce the lump.  Patient pushed in w/c to gym for energy conservation.  Performed sit to stand to RW with S.  Ambulated x 230' with RW and min to mod A around turns to R due to L lateral lean.  In parallel bars performed step ups to 6" step x 5 with each leg with UE support and min A.  Standing hamstring curls with 3# on L LE then hip abduction x 10 on L.  Standing hip abduction R then L with 3# on L then side stepping with bilateral UE support.  Standing balance without UE support with min A x 10 sec then x 20 sec x 3.  Standing with mirror for feedback with walker removed support x 20 sec, continued to note L lateral weight shift and leaning.  Gait with QC on R mod A x 60'.  Then in hallway on green line with mirror for feedback with QC and mod A cues for sequencing with canex 2 x 40'.  Patient working on standing balance with alternate step taps to 3" step using QC for support on R and mod A for balance due to L lateral lean.  Patient assisted in wc to room and left with alarm belt and all needs in reach.   Therapy Documentation Precautions:  Precautions Precautions: Fall, Cervical,  Other (comment) Precaution Comments: very painful neck with all movement; has vertebral artery stenosis Required Braces or Orthoses:  (per orders, no brace needed) Restrictions Weight Bearing Restrictions: No Other Position/Activity Restrictions: no brace needed Pain: Pain Assessment Pain Score: 0-No pain    Therapy/Group: Individual Therapy  Reginia Naas  Magda Kiel, PT 09/27/2020, 10:55 AM

## 2020-09-28 ENCOUNTER — Inpatient Hospital Stay (HOSPITAL_COMMUNITY): Payer: Medicare HMO

## 2020-09-28 ENCOUNTER — Inpatient Hospital Stay (HOSPITAL_COMMUNITY): Payer: Medicare HMO | Admitting: Occupational Therapy

## 2020-09-28 LAB — GLUCOSE, CAPILLARY
Glucose-Capillary: 186 mg/dL — ABNORMAL HIGH (ref 70–99)
Glucose-Capillary: 132 mg/dL — ABNORMAL HIGH (ref 70–99)
Glucose-Capillary: 150 mg/dL — ABNORMAL HIGH (ref 70–99)
Glucose-Capillary: 151 mg/dL — ABNORMAL HIGH (ref 70–99)

## 2020-09-28 MED ORDER — METFORMIN HCL 850 MG PO TABS
850.0000 mg | ORAL_TABLET | Freq: Two times a day (BID) | ORAL | Status: DC
  Administered 2020-09-28 – 2020-10-03 (×10): 850 mg via ORAL
  Filled 2020-09-28 (×10): qty 1

## 2020-09-28 MED ORDER — ARTIFICIAL TEARS OPHTHALMIC OINT
TOPICAL_OINTMENT | Freq: Two times a day (BID) | OPHTHALMIC | Status: DC
  Administered 2020-09-28 – 2020-09-29 (×2): 1 via OPHTHALMIC
  Filled 2020-09-28 (×2): qty 3.5

## 2020-09-28 MED ORDER — ERYTHROMYCIN 5 MG/GM OP OINT
TOPICAL_OINTMENT | Freq: Four times a day (QID) | OPHTHALMIC | Status: DC
  Administered 2020-09-28 – 2020-09-30 (×3): 1 via OPHTHALMIC
  Filled 2020-09-28: qty 3.5

## 2020-09-28 NOTE — Progress Notes (Signed)
Occupational Therapy Session Note  Patient Details  Name: [name] [name]. MRN: [id number] Date of Birth: [birth year]  Today's Date: 09/28/2020 OT Individual Time: 8416-6063 OT Individual Time Calculation (min): 60 min    Short Term Goals: Week 1:  OT Short Term Goal 1 (Week 1): Pt will complete a toilet transfer with 1 assist and LRAD OT Short Term Goal 2 (Week 1): Pt will complete UB dressing with Mod A OT Short Term Goal 3 (Week 1): Pt will complete 1/3 components of donning pants with supervision assist  Skilled Therapeutic Interventions/Progress Updates:    Pt greeted at time of session sitting up in wheelchair with son present and remained throughout session. No pain in resting, mild discomfort in L shoulder with some ADL activity but no number given and rest breaks provided PRN. Self propel to sink surface with Supervision short distance. Doffed shirt with Min A, UB bathing with supervision and donned new shirt in same manner which is improved since pain levels have decreased. Declined LB bathing/dressing and doffed socks with assist as pt stated they had been on for several days. Simulated washing feet with LHS, pt able to reach and no reports of pain during activity, therapist provided thorough washing of feet as he had not washed feet in several days, but he did like LHS for future bathing sessions. Donned socks with sock aide Supervision, donned shoes Max A but will continue to train with shoe horn. Transported to ADL apartment via wheelchair for time management and showed pt and son different shower seats, they are planning to purchase independently or borrow from a family/friend. Brought back to room same manner and print out provided for DME. Alarm on, call bell in reach.   Therapy Documentation Precautions:  Precautions Precautions: Fall, Cervical, Other (comment) Precaution Comments: very painful neck with all movement; has vertebral artery stenosis Required Braces or  Orthoses:  (per orders, no brace needed) Restrictions Weight Bearing Restrictions: No Other Position/Activity Restrictions: no brace needed     Therapy/Group: Individual Therapy  Viona Gilmore 09/28/2020, 12:18 PM

## 2020-09-28 NOTE — Consult Note (Signed)
No chief complaint on file. :       Ophthalmology HPI: This is a 74 y.o.  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 He was sitting in his chair and his vision in the left eye became blurry and had stabbing pain in the right eye. Blinking several times seemed to clear it it up.  He states he has some blurry vision, but it has improved. No pain currently in his eyes. Denies flashes of light, floaters, double vision, photophobia, or foreign body sensation at this time.       Last Eye Exam:6 months ago    Primary Eye Care: Dr. Laverta [place]   Past Medical History:  Diagnosis Date   CAD (coronary artery disease)    Cancer ([facility])    Diabetes ([place])    Dyslipidemia    Gout    History of kidney stones    HTN (hypertension)    Myocardial infarction [facility])      Past Surgical History:  Procedure Laterality Date   ANTERIOR CERVICAL DECOMP/DISCECTOMY FUSION Left 10/20/2019   Procedure: Left Cervical Three-Four Cervical Four-Five Cervical Five-Six Anterior cervical decompression/discectomy/fusion;  Surgeon: Judith Part, MD;  Location: [facility];  Service: Neurosurgery;  Laterality: Left;  Left Cervical Three-Four Cervical Four-Five Cervical Five-Six Anterior cervical decompression/discectomy/fusion   BACK SURGERY     left knee surgery     left shoulder surgery     POSTERIOR CERVICAL FUSION/FORAMINOTOMY N/A 09/17/2020   Procedure: CERVICAL THREE TO CERVICAL SIX POSTERIOR CERVICAL DECOMPRESSION;  Surgeon: Judith Part, MD;  Location: [facility];  Service: Neurosurgery;  Laterality: N/A;   right knee surgery       Social History   Socioeconomic History   Marital status: Married    Spouse name: Not on file   Number of children: Not on file   Years of education: Not on file   Highest education level: Not on file  Occupational History   Not on file  Tobacco  Use   Smoking status: Former Smoker    Packs/day: 5.00    Years: 36.00    Pack years: 180.00    Types: Cigarettes    Start date: 11/24/1952    Quit date: 11/24/1988    Years since quitting: 31.8   Smokeless tobacco: Current User    Types: Snuff  Vaping Use   Vaping Use: Never used  Substance and Sexual Activity   Alcohol use: No    Alcohol/week: 0.0 standard drinks   Drug use: No   Sexual activity: Not on file  Other Topics Concern   Not on file  Social History Narrative   Not on file   Social Determinants of Health   Financial Resource Strain:    Difficulty of Paying Living Expenses: Not on file  Food Insecurity:    Worried About Charity fundraiser in the Last Year: Not on file   [employer] of Food in the Last Year: Not on file  Transportation Needs:    Lack of Transportation (Medical): Not on file   Lack of Transportation (Non-Medical): Not on file  Physical Activity:    Days of Exercise per Week: Not on file   Minutes of Exercise per Session: Not on file  Stress:    Feeling of Stress : Not on file  Social Connections:    Frequency of Communication with Friends and Family: Not on file  Frequency of Social Gatherings with Friends and Family: Not on file   Attends Religious Services: Not on file   Active Member of Clubs or Organizations: Not on file   Attends Archivist Meetings: Not on file   Marital Status: Not on file  Intimate Partner Violence:    Fear of Current or Ex-Partner: Not on file   Emotionally Abused: Not on file   Physically Abused: Not on file   Sexually Abused: Not on file     Allergies  Allergen Reactions   Simvastatin Other (See Comments)    dizziness     No current facility-administered medications on file prior to encounter.   Current Outpatient Medications on File Prior to Encounter  Medication Sig Dispense Refill   aspirin 325 MG tablet Take 1 tablet (325 mg total) by mouth daily.      atorvastatin (LIPITOR) 20 MG tablet Take 1 tablet by mouth daily.     b complex vitamins tablet Take 1 tablet by mouth daily.     colchicine 0.6 MG tablet Take 0.6 mg by mouth daily as needed (gout flare).     gabapentin (NEURONTIN) 300 MG capsule Take 1 capsule by mouth in the morning, at noon, and at bedtime.      glipiZIDE (GLUCOTROL) 5 MG tablet Take 5 mg by mouth daily.      LIFESCAN FINEPOINT LANCETS MISC Use to check blood sugar 3 time(s) daily     metFORMIN (GLUCOPHAGE) 1000 MG tablet Take 1,000 mg by mouth daily.      metoprolol succinate (TOPROL-XL) 50 MG 24 hr tablet Take 1 tablet (50 mg total) by mouth daily. Take with or immediately following a meal. (Patient taking differently: Take 50 mg by mouth daily. ) 90 tablet 1   Omega-3 Fatty Acids (FISH OIL) 1000 MG CAPS Take 1,000 mg by mouth 2 (two) times daily.      omeprazole (PRILOSEC) 20 MG capsule Take 20 mg by mouth daily.   2   oxyCODONE 10 MG TABS Take 1 tablet (10 mg total) by mouth every 6 (six) hours as needed for severe pain ((score 7 to 10)). 30 tablet 0   tamsulosin (FLOMAX) 0.4 MG CAPS capsule Take 0.4 mg by mouth at bedtime.      vitamin E 180 MG (400 UNITS) capsule Take 400 Units by mouth daily.       ROS    Exam:  General: Awake, Alert, Oriented *3  Vision (near): with correction   OD: J3  OS: j3   Confrontational Field:   Full to count fingers, both eyes  Extraocular Motility:  Full ductions and versions, both eyes    External:   Normal Symmetry, States decreased sensation on V1-3 on left side.  Mild left facial droop.    Pupils  OD: 87mm to 80mm reactive without afferent pupillary defect (APD)  OS: 58mm to 42mm reactive without afferent pupillary defect (APD)   IOP(tonopen)  OD: 16  OS: 16  Slit Lamp Exam:  Lids/Lashes  OD: 1+ Blepharitis   OS: 1+ Blepharitis   Conjucntiva/Sclera  OD: White and quiet  OS: White and quiet  Cornea  OD: Rare PEE, without abrasion or  defect  OS: 2+ PEE, without abrasion or defect  Anterior Chamber  OD: Deep and quiet  OS: Deep and quiet  Iris  OD: Normal iris architecture  OS: Normal Iris Architecture   Lens  OD: 1+ NSC, 2+ Cort  OS: 1+ NSC, 2+ Cort  Anterior Vitreous  OD: Clear, without cell  OS: Clear without cell   POSTERIOR POLE EXAM (Dialated with phenylephrine and tropicamide.Dilation may last up to 24 hours)  View:   OD: 20/20 view without opacities  OS: 20/20 view without opacities  Vitreous:   OD: Clear, no cell  OS: Clear, no cell  Disc:   OD: flat, sharp margin, with appropriate color  OS: flat, sharp margin, with appropriate color  C:D Ratio:   OD: 0.2  OS: 0.2  Macula  OD: Flat, with appropriate light reflex  OS: Flat with appropriate light reflex  Vessels  OD: Normal vasculature  OS: Normal vasculature  Periphery  OD: Flat 360 degrees without tear, hole or detachment  OS: Flat 360 degrees without tear, hole or detachment     Assessment and Plan:   This is 74 y.o.  male with dry eyes causing pain and discomfort.  No signs of corneal abrasion, HSV, or intraocular inflammation.  Discussed diagnosis, prognosis and treatment options.      Recommend tear ointment BID while in the hospital.   Discharge on artificial tears QID OU prn discomfort.   Follow up with regular eye doctor (Dr. Laverta [place]) as outpatient within one month of discharge.     Please call with questions or concerns.    Julian Reil, M.D.  [facility] [address] [place], [place] [address] [phone] ([phone]

## 2020-09-28 NOTE — Progress Notes (Signed)
[facility] PHYSICAL MEDICINE & REHABILITATION PROGRESS NOTE   Subjective/Complaints:    Pt reports had episode last night around 4-5 pm- where felt like his L eye was stabbed with a large knife- ever since then, his eyes has been draining- slightly- yellowish drainage- as well as sore and blurry vision on L ever since then. Also reports at the same time, had a "HOT" feeling on left side of mouth- has had the hot feeling ever since L side sensory changes/mild weakness on L - since 8/21.      Says overall neck pain is now tolerable.  LBM this AM-   ROS:  Pt denies SOB, abd pain, CP, N/V/C/D, and vision changes   Objective:   No results found. No results for input(s): WBC, HGB, HCT, PLT in the last 72 hours. No results for input(s): NA, K, CL, CO2, GLUCOSE, BUN, CREATININE, CALCIUM in the last 72 hours.  Intake/Output Summary (Last 24 hours) at 09/28/2020 0832 Last data filed at 09/28/2020 0805 Gross per 24 hour  Intake 640 ml  Output 2000 ml  Net -1360 ml        Physical Exam: Vital Signs Blood pressure 126/62, pulse 90, temperature 97.8 F (36.6 C), temperature source Oral, resp. rate 16, height 5\' 9"  (1.753 m), weight 102 kg, SpO2 100 %.  General: No acute distress- sitting up in bedside chair, appropriate, kept blinking but on L side, nurse in room, NAD HEENT: L eye slightly blurry per pt when tested vs R side- wasn't that way before; small amount of creamy yellowish drainage in medial corner of L eye; TTP around entire L eye/eyelid- decreased sensation on V1/2/3 on L side of face- was there at admission Mood and affect are appropriate- slightly flat Heart: RRR Lungs: CTA B/L- no W/R/R- good air movement Abdomen: Soft, NT, ND, (+)BS  Extremities: No clubbing, cyanosis, or edema Skin: No evidence of breakdown, no evidence of rash   Musculoskeletal:  Comments: Well healed old left knee surgical scars. Left fore foot and great toe with mild erythema--minimal  tenderness to touch.  UEs- at least 4/5 in UE's and LE's- due to neck incision, pt asked for no sig. Resistance to be used. Appears equal except L ankle from gout Very TTP over L upper trap, levators, and some in scalenes - all on L- not exact areas I already injected, but close by.  L ankle DF/PF 3-4/5- due to gout Skin: no skin breakdown on heels B/L IV's out Neurological:  Mental Status: HOx3  Comments: Mild left facial weakness with dysphonia noted. Left sided weakness with sensory deficits. Psychiatric:  Comments: sad/flat affect due to pain- but better      Assessment/Plan: 1. Functional deficits which require 3+ hours per day of interdisciplinary therapy in a comprehensive inpatient rehab setting.  Physiatrist is providing close team supervision and 24 hour management of active medical problems listed below.  Physiatrist and rehab team continue to assess barriers to discharge/monitor patient progress toward functional and medical goals  Care Tool:  Bathing    Body parts bathed by patient: Face, Right arm, Left arm, Chest, Abdomen   Body parts bathed by helper: Right arm, Left arm, Chest, Abdomen, Buttocks, Right lower leg, Left lower leg     Bathing assist Assist Level: Minimal Assistance - Patient > 75% (Min A for UB, would be Max for LB)     Upper Body Dressing/Undressing Upper body dressing   What is the patient wearing?: Pull over shirt  Upper body assist Assist Level: Maximal Assistance - Patient 25 - 49% (limited by pain)    Lower Body Dressing/Undressing Lower body dressing      What is the patient wearing?: Pants     Lower body assist Assist for lower body dressing: Moderate Assistance - Patient 50 - 74%     Toileting Toileting Toileting Activity did not occur (Clothing management and hygiene only): N/A (no void or bm)  Toileting assist Assist for toileting: Moderate Assistance - Patient 50 - 74% Assistive Device Comment: urinal    Transfers Chair/bed transfer  Transfers assist     Chair/bed transfer assist level: Minimal Assistance - Patient > 75% Chair/bed transfer assistive device: Programmer, multimedia   Ambulation assist      Assist level: Moderate Assistance - Patient 50 - 74% Assistive device: Walker-rolling Max distance: 230'   Walk 10 feet activity   Assist     Assist level: Moderate Assistance - Patient - 50 - 74% Assistive device: Walker-rolling   Walk 50 feet activity   Assist Walk 50 feet with 2 turns activity did not occur: Safety/medical concerns  Assist level: Moderate Assistance - Patient - 50 - 74% Assistive device: Walker-rolling    Walk 150 feet activity   Assist Walk 150 feet activity did not occur: Safety/medical concerns  Assist level: Moderate Assistance - Patient - 50 - 74% Assistive device: Walker-rolling    Walk 10 feet on uneven surface  activity   Assist Walk 10 feet on uneven surfaces activity did not occur: Safety/medical concerns   Assist level: Moderate Assistance - Patient - 50 - 74% Assistive device: Aeronautical engineer Will patient use wheelchair at discharge?:  (TBD but do not anticipate) Type of Wheelchair: Manual    Wheelchair assist level: Supervision/Verbal cueing Max wheelchair distance: 39'    Wheelchair 50 feet with 2 turns activity    Assist        Assist Level: Total Assistance - Patient < 25%   Wheelchair 150 feet activity     Assist  Wheelchair 150 feet activity did not occur: Safety/medical concerns       Blood pressure 126/62, pulse 90, temperature 97.8 F (36.6 C), temperature source Oral, resp. rate 16, height 5\' 9"  (1.753 m), weight 102 kg, SpO2 100 %.  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 -patient may Shower once doesn't need pressure dressing on posterior  neck -ELOS/Goals: 10-12 days- Supervision to mod I 2. Antithrombotics: -DVT/anticoagulation:Mechanical:Sequential compression devices, below kneeBilateral lower extremities - will see, once bleeding stops from cervical incision, when can start Lovenox 11/16- Surgery was 11/7- has been 9 days- at high risk for DVT_ will start Lovenox. Called [facility]- didn't hear back so far. -antiplatelet therapy: N/A 3. Pain Management:Continue Neurontin TID with Oxycodone prn  11/12- will add flexeril scheduled for pain/spasms- if need be, will do trigger point injections this weekend.  11/13- will try valium 5 mg BID - if not helpful, do TrP injections Had TPI 11/14  11/16- Trigger point injections again today- still very tight/painful  11/18- pain mujch better since injections- now "tolerable".  4. Mood:LCSW to follow for evaluation and support. -antipsychotic agents: N/A 5. Neuropsych: This patientiscapable of making decisions on hisown behalf. 6. Skin/Wound Care:Routine pressure relief measures. Monitor wound for healing. 7. Fluids/Electrolytes/Nutrition:Monitor I/O. Appetite has been good. Check BMET in am.  8. HTN: Monitor BP tid--continue Toprol XL  daily  11/12- BP well controlled- con't regimen 9. Gout flare: Continue colchicine bid--symptoms improving.Don't think will need Steroids  11/12- said much improved- no steroids required  11/14- resolved 10. T2DM: Will monitor BS ac/hs. BS poorly controlled--resume metformin. Continue to hold glucotrol. Will use SSI for elevated BS  11/12- jist restarted Metformin- BGs 160s-226- will give 1-2 days and see if better- then restart glucotrol.   11/13- a little better- max is 200- will add glucotrol tomorrow if not better   CBG (last 3)  Recent Labs    09/27/20 1632 09/27/20 2122 09/28/20 0631  GLUCAP 147* 197* 186*   11/16- BGs 160s- con't regimen- might need higher metformin dose   11/18- BGs 147  to 197- will increase metformin to 850 mg BID 11. Hyponatremia: Recheck labs in am.  12. Constipation: Will add Senna S.Might need sorbitol- no BM since last Friday.  11/12- LBM yesterday- con't regimen  11/13- LBM 2 days ago- if no BM by tomorrow, will try Sorbitol.   11/16- LBM yesterday but hard- will increase Colace to TID since still hard balls.   11/18- LBM this AM- was better 13. Neurogenic bladder vs outlet obstruction with  urinary retention- need to check into this.Augment bowel program as needed.   11/13- requiring in and out caths due to PVRs >350cc- con't for now- max out flomax. Will help him retain LESS- will tell nursing.  11/13 flomax increased to 0.8mg  - monitor for orthostatic changes, thus far no sig effect  11/16- pt insisted that foley be placed yesterday- since caths so painful, even with lidocaine- will either remove later in week OR f/u with Urology after d/c- so far, U/A looks (-) for UTI.   Given severe pain with caths and refusal of I/O caths will replace foley and repeat voiding trial later in week with lidocaine jelly  Has tenderness over the perineum but no swelling or erythema  , pain with caths, urinary retention, may have prostatis , check UA C and S , Start empiric bactrim DS when UA Cx obtained.  14. L eye pain/blurriness  11/18- have called and L/M for Ophthalmology- waiting to hear back- will see what the plan is from them- Dr Alanda Slim. .     I spent a total of 40 minutes on total care today, calling ophthalmology and spending a lot of time examining pt.    LOS: 7 days A FACE TO FACE EVALUATION WAS PERFORMED  [name] [name] 09/28/2020, 8:32 AM

## 2020-09-28 NOTE — Progress Notes (Addendum)
Physical Therapy Session Note  Patient Details  Name: [name] [name]. MRN: [id number] Date of Birth: 14-May-1946  Today's Date: 09/28/2020 PT Individual Time:Session1: 3790-2409; [place]: 1300-1400; Session 3: 7353-2992 PT Individual Time Calculation (min): 45 min, 60 min & 43 min  Short Term Goals: Week 1:  PT Short Term Goal 1 (Week 1): Pt will perform supine<>sit with min assist using same features as will have at home PT Short Term Goal 2 (Week 1): Pt will perform sit<>stand using LRAD with CGA PT Short Term Goal 3 (Week 1): Pt will perform stand pivot transfers using LRAD with CGA PT Short Term Goal 4 (Week 1): Pt will ambulate at least 72ft using LRAD with CGA  Skilled Therapeutic Interventions/Progress Updates:    Session1:  Patient seated in w/c in room.  Reports little sore in shoulders today.  Sit to stand to RW S and increased time.  Stepping around to sink with RW and CGA.  Standing to comb hair with CGA.  Ambulated in hallway to dayroom with min A wide BOS and hitting table on L side when entering dayroom x 120'.  Patient seated for rest in chair, then stood and ambulated to stand for cornhole toss 2 x 6 reaching for bags on L side and tossing with R hand; then 2 x 6 reaching for bags on R side and tossing with L hand.  Overall CGA for dynamic balance activities.  Ambulated around obstacles in dayroom to sit on mat with CGA and cues using RW.  Patient sit to stand with R foot on 6" step for L side strengthening x 5 reps.  Patient ambulated back to room with RW and CGA x 130'.  Left seated in w/c with alarm belt and needs in reach.  Session2: Patient in w/c in room. Seated replaced Kinesiotex tape on L shoulder upper trap area "I" strip I-O with off the paper tension with muscle on slight stretch.  Patient requesting tape to R side so performed as for L.  Patient pushed in w/c to therapy gym.  Sit to stand from w/c with QC and min A increased time. Ambulated with QC on R side with  mirror for feedback and cues for keeping line in tile between his feet for balance and to decreased L lateral lean.  Performed 2 x 50' with min to mod A with turns.  Standing balance and L knee proprioceptive feedback using green t-band around knee for terminal knee extension 4 x 10.  Patient ambulated with RW x 150' making R turns with min A overall, occasional mod A due to leaning L on turns and cues for L knee extension in stance.  Patient negotiated 4 6" steps with rails and min to mod A, mod cues for sequencing.  Patient assisted in w/c to room and left with alarm belt and needs within reach.   Session3:  Patient in w/c in room, reports MD in to speak with him and his wife via phone about his symptoms and feels he did have a stroke.  Patient seemingly flat and encouraged him that his initial thought was that he had a stroke due to symptoms and now those thoughts are justified.  Pushed in w/c outside for community mobility and psychosocial adjustment.  Patient sit to stand to RW and ambulated 140' on level pavement and brick with min to mod A.  Negotiated curb steps with RW and mod cues and mod A.  Pushed in w/c back to unit.  Sit  to stand to RW S and ambulated with RW x 180' x 2 with min A and occasional cues for feet apart.  Patient assisted to room in w/c and left with alarm belt activated and needs in reach.   Therapy Documentation Precautions:  Precautions Precautions: Fall, Cervical, Other (comment) Precaution Comments: very painful neck with all movement; has vertebral artery stenosis Required Braces or Orthoses:  (per orders, no brace needed) Restrictions Weight Bearing Restrictions: No Other Position/Activity Restrictions: no brace needed Pain: Pain Assessment Pain Score: 2  Pain Location: Shoulder Pain Orientation: Right;Left Pain Descriptors / Indicators: Tightness Pain Onset: On-going Pain Intervention(s): Ambulation/increased activity    Therapy/Group: Individual  Therapy  Reginia Naas  Magda Kiel, PT 09/28/2020, 8:46 AM

## 2020-09-29 ENCOUNTER — Inpatient Hospital Stay (HOSPITAL_COMMUNITY): Payer: Medicare HMO | Admitting: Occupational Therapy

## 2020-09-29 ENCOUNTER — Inpatient Hospital Stay (HOSPITAL_COMMUNITY): Payer: Medicare HMO

## 2020-09-29 LAB — GLUCOSE, CAPILLARY
Glucose-Capillary: 159 mg/dL — ABNORMAL HIGH (ref 70–99)
Glucose-Capillary: 150 mg/dL — ABNORMAL HIGH (ref 70–99)
Glucose-Capillary: 194 mg/dL — ABNORMAL HIGH (ref 70–99)
Glucose-Capillary: 178 mg/dL — ABNORMAL HIGH (ref 70–99)

## 2020-09-29 MED ORDER — GABAPENTIN 300 MG PO CAPS
600.0000 mg | ORAL_CAPSULE | Freq: Two times a day (BID) | ORAL | Status: DC
  Administered 2020-09-29 – 2020-10-03 (×8): 600 mg via ORAL
  Filled 2020-09-29 (×8): qty 2

## 2020-09-29 NOTE — Progress Notes (Signed)
Physical Therapy Session Note  Patient Details  Name: [name] [name]. MRN: [id number] Date of Birth: 08/02/1946  Today's Date: 09/29/2020 PT Individual Time: 0800-0830 PT Individual Time Calculation (min): 30 min   Short Term Goals: Week 1:  PT Short Term Goal 1 (Week 1): Pt will perform supine<>sit with min assist using same features as will have at home PT Short Term Goal 2 (Week 1): Pt will perform sit<>stand using LRAD with CGA PT Short Term Goal 3 (Week 1): Pt will perform stand pivot transfers using LRAD with CGA PT Short Term Goal 4 (Week 1): Pt will ambulate at least 57ft using LRAD with CGA Week 2:    Week 3:     Skilled Therapeutic Interventions/Progress Updates:    PAIN Pt c/o L medial eye pain, tender to touch near tear duct.  Difficulty participating in visual tracking/attempted screening for vestibular issue, treatment to tolerance.  Pt initially seated in wc w/Dr. Arma Heading at bedside - requesting vestibular assessment.  Relayed this to scheduler/requested ASAP.  Pt transported to gym. Sit to stand from wc to rw and gait x 136ft w/cga to min assist, cues to increase step width on L, mild L lean/tends to step to midline w/LLE, lacks 15-20degrees extension L knee due to longstanding ROM deficit/proprioceptive deficit/possible attention issue, tends to catch L wheels of walker on obstacles to his L.  Donned shoes in sitting max assist for time management only. Gait 146ft w/RW cga initially, increases to min assist w/fatigue, gait pattern as above.  Attempted screening for vestibular deficit, visual scanning confounded by c/o L eye pain (see above).  Discussed request for assessment w/primary therapist, will be continued in pm sessions.  Pt left oob in wc w/alarm belt set and needs in reach, nurse tech in room.    Therapy Documentation Precautions:  Precautions Precautions: Fall, Cervical, Other (comment) Precaution Comments: very painful neck with all movement;  has vertebral artery stenosis Required Braces or Orthoses:  (per orders, no brace needed) Restrictions Weight Bearing Restrictions: No Other Position/Activity Restrictions: no brace needed   Therapy/Group: Individual Therapy  Callie Fielding, [place] 09/29/2020, 12:37 PM

## 2020-09-29 NOTE — Progress Notes (Signed)
[facility] PHYSICAL MEDICINE & REHABILITATION PROGRESS NOTE   Subjective/Complaints:   Pt reports after Dr Alanda Slim left last night, had sharp pain in L eye and side of face on L again last night-   Very depressed- it appears- worst balance he's every had, this AM- also called for pain meds last night ~ 11pm - said didn't get them- will check into this?   Also feeling lightheaded and off balance. Asking if this has to do with vertebral artery stenosis? LBM yesterday.   Eye doctor came to see him last evening.  They found no corneal abrasion, no HSV, etc.  Added lacilube ointment BID   ROS:  Pt denies SOB, abd pain, CP, N/V/C/D, and vision changes    Objective:   No results found. No results for input(s): WBC, HGB, HCT, PLT in the last 72 hours. No results for input(s): NA, K, CL, CO2, GLUCOSE, BUN, CREATININE, CALCIUM in the last 72 hours.  Intake/Output Summary (Last 24 hours) at 09/29/2020 1307 Last data filed at 09/29/2020 0800 Gross per 24 hour  Intake 480 ml  Output 3800 ml  Net -3320 ml        Physical Exam: Vital Signs Blood pressure 120/70, pulse 86, temperature 98.4 F (36.9 C), resp. rate 18, height 5\' 9"  (1.753 m), weight 102 kg, SpO2 98 %.  General: sitting up in bedside chair, very depressed affect, near tears, NAD HEENT: decreased sensation V1/2/3 on L side- L eye shiny from ointment Mood and affect are very depressed Heart: RRR Lungs: CTA B/L- no W/R/R- good air movement Abdomen: Soft, NT, ND, (+)BS   Extremities: No clubbing, cyanosis, or edema Skin: No evidence of breakdown, no evidence of rash   Musculoskeletal:  Comments: Well healed old left knee surgical scars. Left fore foot and great toe with mild erythema--minimal tenderness to touch.  UEs- at least 4/5 in UE's and LE's- due to neck incision, pt asked for no sig. Resistance to be used. Appears equal except L ankle from gout Very TTP over L upper trap, levators, and some in scalenes  - all on L- not exact areas I already injected, but close by.  L ankle DF/PF 3-4/5- due to gout Skin: no skin breakdown on heels B/L IV's out Neurological:  Mental Status: HOx3  Comments: Mild left facial weakness with dysphonia noted. Left sided weakness with sensory deficits. Psychiatric:  Comments: sad/flat affect due to pain- but better      Assessment/Plan: 1. Functional deficits which require 3+ hours per day of interdisciplinary therapy in a comprehensive inpatient rehab setting.  Physiatrist is providing close team supervision and 24 hour management of active medical problems listed below.  Physiatrist and rehab team continue to assess barriers to discharge/monitor patient progress toward functional and medical goals  Care Tool:  Bathing    Body parts bathed by patient: Face, Right arm, Left arm, Chest, Abdomen   Body parts bathed by helper: Right arm, Left arm, Chest, Abdomen, Buttocks, Right lower leg, Left lower leg     Bathing assist Assist Level: Minimal Assistance - Patient > 75% (Min A for UB, would be Max for LB)     Upper Body Dressing/Undressing Upper body dressing   What is the patient wearing?: Pull over shirt    Upper body assist Assist Level: Supervision/Verbal cueing    Lower Body Dressing/Undressing Lower body dressing      What is the patient wearing?: Pants     Lower body assist Assist for lower  body dressing: Moderate Assistance - Patient 50 - 74%     Toileting Toileting Toileting Activity did not occur (Clothing management and hygiene only): N/A (no void or bm)  Toileting assist Assist for toileting: Moderate Assistance - Patient 50 - 74% Assistive Device Comment: urinal   Transfers Chair/bed transfer  Transfers assist     Chair/bed transfer assist level: Contact Guard/Touching assist Chair/bed transfer assistive device: Programmer, multimedia   Ambulation assist      Assist level: Contact  Guard/Touching assist Assistive device: Walker-rolling Max distance: 150   Walk 10 feet activity   Assist     Assist level: Contact Guard/Touching assist Assistive device: Walker-rolling   Walk 50 feet activity   Assist Walk 50 feet with 2 turns activity did not occur: Safety/medical concerns  Assist level: Contact Guard/Touching assist Assistive device: Walker-rolling    Walk 150 feet activity   Assist Walk 150 feet activity did not occur: Safety/medical concerns  Assist level: Minimal Assistance - Patient > 75% Assistive device: Walker-rolling    Walk 10 feet on uneven surface  activity   Assist Walk 10 feet on uneven surfaces activity did not occur: Safety/medical concerns   Assist level: Moderate Assistance - Patient - 50 - 74% Assistive device: Aeronautical engineer Will patient use wheelchair at discharge?:  (TBD but do not anticipate) Type of Wheelchair: Manual    Wheelchair assist level: Supervision/Verbal cueing Max wheelchair distance: 109'    Wheelchair 50 feet with 2 turns activity    Assist        Assist Level: Total Assistance - Patient < 25%   Wheelchair 150 feet activity     Assist  Wheelchair 150 feet activity did not occur: Safety/medical concerns       Blood pressure 120/70, pulse 86, temperature 98.4 F (36.9 C), resp. rate 18, height 5\' 9"  (1.753 m), weight 102 kg, SpO2 98 %.  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 -patient may Shower once doesn't need pressure dressing on posterior neck -ELOS/Goals: 10-12 days- Supervision to mod I 2. Antithrombotics: -DVT/anticoagulation:Mechanical:Sequential compression devices, below kneeBilateral lower extremities - will see, once bleeding stops from cervical incision, when can start Lovenox 11/16- Surgery was 11/7- has been 9 days-  at high risk for DVT_ will start Lovenox. Called [facility]- didn't hear back so far. -antiplatelet therapy: N/A 3. Pain Management:Continue Neurontin TID with Oxycodone prn  11/12- will add flexeril scheduled for pain/spasms- if need be, will do trigger point injections this weekend.  11/13- will try valium 5 mg BID - if not helpful, do TrP injections Had TPI 11/14  11/16- Trigger point injections again today- still very tight/painful  11/18- pain mujch better since injections- now "tolerable".   11/19- now having pain in L side of face- will increase Gabapentin to 600 mg BID- see if that helps.  4. Mood:LCSW to follow for evaluation and support. -antipsychotic agents: N/A 5. Neuropsych: This patientiscapable of making decisions on hisown behalf. 6. Skin/Wound Care:Routine pressure relief measures. Monitor wound for healing. 7. Fluids/Electrolytes/Nutrition:Monitor I/O. Appetite has been good. Check BMET in am.  8. HTN: Monitor BP tid--continue Toprol XL daily  11/12- BP well controlled- con't regimen 9. Gout flare: Continue colchicine bid--symptoms improving.Don't think will need Steroids  11/12- said much improved- no steroids required  11/14- resolved 10. T2DM: Will monitor BS ac/hs. BS poorly controlled--resume metformin. Continue to hold glucotrol. Will  use SSI for elevated BS  11/12- jist restarted Metformin- BGs 160s-226- will give 1-2 days and see if better- then restart glucotrol.   11/13- a little better- max is 200- will add glucotrol tomorrow if not better   CBG (last 3)  Recent Labs    09/28/20 2113 09/29/20 0547 09/29/20 1122  GLUCAP 151* 159* 150*   11/16- BGs 160s- con't regimen- might need higher metformin dose   11/18- BGs 147 to 197- will increase metformin to 850 mg BID  11/19- BGs slightly better- in 150s- con't regimen 11. Hyponatremia: Recheck labs in am.  12. Constipation: Will add Senna S.Might need sorbitol- no BM since last  Friday.  11/12- LBM yesterday- con't regimen  11/13- LBM 2 days ago- if no BM by tomorrow, will try Sorbitol.   11/16- LBM yesterday but hard- will increase Colace to TID since still hard balls.   11/18- LBM this AM- was better 13. Neurogenic bladder vs outlet obstruction with  urinary retention- need to check into this.Augment bowel program as needed.   11/13- requiring in and out caths due to PVRs >350cc- con't for now- max out flomax. Will help him retain LESS- will tell nursing.  11/13 flomax increased to 0.8mg  - monitor for orthostatic changes, thus far no sig effect  11/16- pt insisted that foley be placed yesterday- since caths so painful, even with lidocaine- will either remove later in week OR f/u with Urology after d/c- so far, U/A looks (-) for UTI.   11/19- will d/c foley Monday- since focused so much on L eye/facial issues  14. L eye pain/blurriness  11/18- have called and L/M for Ophthalmology- waiting to hear back- will see what the plan is from them- Dr Alanda Slim. .   11/19- could be nerve pain- Dr Alanda Slim found nothing wrong with eyes.  15. Vertigo  11/19- will add Vertigo evaluation by PT- does appear to have good ability right himself with pushing him to each side/front/back- while sitting. But could be inner ear? Will check with PT    LOS: 8 days A FACE TO FACE EVALUATION WAS PERFORMED  Unnamed Hino 09/29/2020, 1:07 PM

## 2020-09-29 NOTE — Progress Notes (Signed)
Physical Therapy Weekly Progress Note  Patient Details  Name: [name] [name]. MRN: [id number] Date of Birth: 05-08-1946  Beginning of progress report period: September 21, 2020 End of progress report period: September 29, 2020  Today's Date: 09/29/2020 PT Individual Time:Session1: 7829-5621; Arthor Captain: 1500-1600 PT Individual Time Calculation (min): 45 min & 60 min  Patient has met 4 of 4 short term goals.  Patient has made excellent progress towards goals.  Has continued to lean to L and noted L knee flexion which pt states is chronic, but contributes to balance issues.  Patient learning to compensate and improving in balance and gait.   Patient continues to demonstrate the following deficits muscle weakness and muscle joint tightness, impaired timing and sequencing and decreased coordination and decreased midline orientation and decreased attention to left and therefore will continue to benefit from skilled PT intervention to increase functional independence with mobility.  Patient progressing toward long term goals..  Continue plan of care.  PT Short Term Goals Week 1:  PT Short Term Goal 1 (Week 1): Pt will perform supine<>sit with min assist using same features as will have at home PT Short Term Goal 1 - Progress (Week 1): Met PT Short Term Goal 2 (Week 1): Pt will perform sit<>stand using LRAD with CGA PT Short Term Goal 2 - Progress (Week 1): Met PT Short Term Goal 3 (Week 1): Pt will perform stand pivot transfers using LRAD with CGA PT Short Term Goal 3 - Progress (Week 1): Met PT Short Term Goal 4 (Week 1): Pt will ambulate at least 29f using LRAD with CGA Week 2:  PT Short Term Goal 1 (Week 2): STG=LTG due to ELOS  Skilled Therapeutic Interventions/Progress Updates:   Session1:  Patient in w/c at bedside.  Completed vestibular evaluation as noted below.  Educated in potential for balance impact with potential history of peripheral hypofunction and now exacerbated by cervical  surgery and potential TIA/CVA.  Patient ambulated x 200' with CGA with RW.  STG's checked as noted above.  Patient left seated in w/c with chair alarm active and needs in reach.     Vestibular Assessment - 09/29/20 0001      Symptom Behavior   Subjective history of current problem Reports had episode of "vertigo" including slurred speech, vomiting, off balance, went to ER and had scans diagnosed with vertigo and gave medication.  Had symptoms couple more days at home.  No further issues since then.  Denies current symptoms    Type of Dizziness  Imbalance      Oculomotor Exam   Oculomotor Alignment Normal   but L lower lid pulled down from prior basal cell CA removal   Ocular ROM WFL, but some pain looking R with L eye     Spontaneous Absent    Gaze-induced  Absent    Head shaking Horizontal --   NT due to cervical surgery/pain   Head Shaking Vertical --   NT due to cervical surgery/pain   Smooth Pursuits Saccades    Saccades Intact      Oculomotor Exam-Fixation Suppressed    Left Head Impulse --   NT due to cervical surgery/pain   Right Head Impulse --   NT due to cervical surgery/pain     Vestibulo-Ocular Reflex   VOR 1 Head Only (x 1 viewing) no dizziness and able to keep target for 10 head turns/10 head nods, noted some increased imbalance EOB no UE support with head nods  VOR Cancellation Corrective saccades   w/ head movement L     Auditory   Comments intact to scratch test and equal bilateral      Positional Testing   Sidelying Test Sidelying Right;Sidelying Left      Sidelying Right   Sidelying Right Duration 1-1.5 min    Sidelying Right Symptoms Left nystagmus   but pt asymptomatic     Sidelying Left   Sidelying Left Duration 30 sec    Sidelying Left Symptoms Upbeat, left rotatory nystagmus   for 3 beats only, asymptomatic          Session2:  Patient in w/c in room.  Sit to stand at RW to perform standing gaze stabilization with UE support and CGA x 60 sec with  head turns, then 60 sec with head nods.  Standing static no UE support x 60 sec.  Patient ambulated to therapy gym x 150' with RW and CGA.  Performed Berg balance assessment as noted below (21/56).  Educated on fall risk, use of RW, lighting, footwear, compensatory techniques for balance and fall prevention.  Discussed should he fall would need to call 911 for assistance for fall recovery due to s/p cervical surgery not safe to practice floor transfers at this time.  Patient ambulated 43' with RW and CGA, then negotiated 4 steps with bilateral rails mod cues and min A.  Patient ambulated to room x 200' with RW and CGA progressing to min A due to fatigue and leaning L.  Issued HEP for seated VOR/adaptation/gaze stabilization exercises with target on wall along with HEP for L knee TKE in standing, hip abduction and side stepping.  Patient left up in w/c with chair alarm activated and all needs in reach.   Therapy Documentation Precautions:  Precautions Precautions: Fall, Cervical, Other (comment) Precaution Comments: very painful neck with all movement; has vertebral artery stenosis Required Braces or Orthoses:  (per orders, no brace needed) Restrictions Weight Bearing Restrictions: No Other Position/Activity Restrictions: no brace needed Pain: Pain Assessment Pain Scale: Faces Faces Pain Scale: Hurts little more Pain Type: Acute pain Pain Orientation: Left Pain Descriptors / Indicators: Tightness Pain Onset: On-going Pain Intervention(s): Ambulation/increased activity  Balance: Balance Balance Assessed: Yes Standardized Balance Assessment Standardized Balance Assessment: Berg Balance Test Berg Balance Test Sit to Stand: Able to stand  independently using hands Standing Unsupported: Able to stand 2 minutes with supervision Sitting with Back Unsupported but Feet Supported on Floor or Stool: Able to sit safely and securely 2 minutes Stand to Sit: Controls descent by using hands Transfers:  Able to transfer with verbal cueing and /or supervision Standing Unsupported with Eyes Closed: Able to stand 10 seconds with supervision Standing Ubsupported with Feet Together: Needs help to attain position and unable to hold for 15 seconds From Standing, Reach Forward with Outstretched Arm: Can reach forward >5 cm safely (2") From Standing Position, Pick up Object from Floor: Unable to try/needs assist to keep balance From Standing Position, Turn to Look Behind Over each Shoulder: Needs supervision when turning Turn 360 Degrees: Needs assistance while turning Standing Unsupported, Alternately Place Feet on Step/Stool: Needs assistance to keep from falling or unable to try Standing Unsupported, One Foot in Front: Loses balance while stepping or standing Standing on One Leg: Unable to try or needs assist to prevent fall Total Score: 21   Therapy/Group: Individual Therapy  Reginia Naas  Magda Kiel, PT 09/29/2020, 1:43 PM

## 2020-09-29 NOTE — Progress Notes (Signed)
Had long discussion with daughter who was upset that patient and wife were told that he had a stroke-->"it tore them up". He has been to 3 neurologists--at [facility], [facility] and [facility]-->all tests done have been negative and does not want that work up demeaned.  She does not want him to have ANY insulin as she feels that it is causing emotional lability, exacerbating his "symptoms" and causing decline. Attempted to discuss SSI protocol, diabetic neuropathy  as well as neurogenic bladder with patient and daughter. They are adamant against using any insulin--"he has a endocrinologist who manages his diabetes and BS 120-180 range has been acceptable and he has never had any insulin with prior hospitalizations"  SSI d/c and daughter updated on current progress.   She wants to be primary contact for updates. Also want [facility] for follow up therapy.

## 2020-09-29 NOTE — Progress Notes (Signed)
Patient ID: [name] [name]., male   DOB: Jan 22, 1946, 74 y.o.   MRN: [id number]   [facility] referral sent out, awaiting decision.   [place], [place]

## 2020-09-29 NOTE — Progress Notes (Signed)
Patient ID: [name] [name]., male   DOB: 12-31-1945, 74 y.o.   MRN: [id number]  [facility] unable to accept patient due to staffing. Will follow to find company for follow up.   [place], [place]

## 2020-09-29 NOTE — Progress Notes (Signed)
Occupational Therapy Session Note  Patient Details  Name: [name] [name]. MRN: [id number] Date of Birth: 1946/02/28  Today's Date: 09/29/2020 OT Individual Time: 1003-1100 OT Individual Time Calculation (min): 57 min   Short Term Goals: Week 1:  OT Short Term Goal 1 (Week 1): Pt will complete a toilet transfer with 1 assist and LRAD OT Short Term Goal 2 (Week 1): Pt will complete UB dressing with Mod A OT Short Term Goal 3 (Week 1): Pt will complete 1/3 components of donning pants with supervision assist  Skilled Therapeutic Interventions/Progress Updates:    Pt greeted in the recliner, stating he had some radiating pain/abnormal sensations in his eyes earlier and the medical team had already assessed him. Pt still concerned. Pt still having back pain and having a rough day overall, premedicated for pain. He was agreeable to engage in light grooming tasks w/c level at the sink to work on bilateral UE ROM Lt>Rt and increase psychosocial wellbeing. Pt engaged in shaving with Mod A overall for adherence to cervical precautions. OT afterwards washed his hair while he sat upright in the w/c, per PA ok to do so with staple site in the Rt side of his head. Pt afterwards combed and blow-dried hair given Min A for safety near staple site. His affect was visibly brightened. Pt doffed/donned shirt during session given setup assistance. At end of tx pt opted to remain sitting up in the w/c. Left him with all needs within reach and safety belt fastened.   Therapy Documentation Precautions:  Precautions Precautions: Fall, Cervical, Other (comment) Precaution Comments: very painful neck with all movement; has vertebral artery stenosis Required Braces or Orthoses:  (per orders, no brace needed) Restrictions Weight Bearing Restrictions: No Other Position/Activity Restrictions: no brace needed ADL: ADL Eating: Not assessed Grooming: Supervision/safety Where Assessed-Grooming: Sitting at sink Upper  Body Bathing: Maximal assistance Where Assessed-Upper Body Bathing: Sitting at sink Lower Body Bathing: Maximal assistance Where Assessed-Lower Body Bathing: Sitting at sink, Standing at sink Upper Body Dressing: Maximal assistance Where Assessed-Upper Body Dressing: Sitting at sink Lower Body Dressing: Maximal assistance Where Assessed-Lower Body Dressing: Sitting at sink, Standing at sink Toileting: Not assessed Toilet Transfer: Not assessed Tub/Shower Transfer: Not assessed ADL Comments: Functional assessment profoundly limited by pain      Therapy/Group: Individual Therapy  Teniyah Seivert A Sayla Golonka 09/29/2020, 12:35 PM

## 2020-09-29 NOTE — Progress Notes (Signed)
Patient ID: [name] [name]., male   DOB: 08/21/1946, 74 y.o.   MRN: [id number]   [facility] unable to accept patient due to insurance. Will follow to find company for follow up.   [place], [place]

## 2020-09-30 ENCOUNTER — Inpatient Hospital Stay (HOSPITAL_COMMUNITY): Payer: Medicare HMO

## 2020-09-30 LAB — GLUCOSE, CAPILLARY
Glucose-Capillary: 121 mg/dL — ABNORMAL HIGH (ref 70–99)
Glucose-Capillary: 166 mg/dL — ABNORMAL HIGH (ref 70–99)
Glucose-Capillary: 200 mg/dL — ABNORMAL HIGH (ref 70–99)
Glucose-Capillary: 221 mg/dL — ABNORMAL HIGH (ref 70–99)

## 2020-09-30 NOTE — Plan of Care (Signed)
  Problem: Consults Goal: RH SPINAL CORD INJURY PATIENT EDUCATION Description:  See Patient Education module for education specifics.  Outcome: Progressing   Problem: SCI BOWEL ELIMINATION Goal: RH STG MANAGE BOWEL WITH ASSISTANCE Description: STG Manage Bowel with supervision Assistance. Outcome: Progressing Goal: RH STG SCI MANAGE BOWEL WITH MEDICATION WITH ASSISTANCE Description: STG SCI Manage bowel with medication with mod I assistance. Outcome: Progressing Goal: RH STG MANAGE BOWEL W/EQUIPMENT W/ASSISTANCE Description: STG Manage Bowel With Equipment With mod I Assistance Outcome: Progressing   Problem: SCI BLADDER ELIMINATION Goal: RH STG MANAGE BLADDER WITH ASSISTANCE Description: STG Manage Bladder With mod I Assistance Outcome: Progressing   Problem: RH SKIN INTEGRITY Goal: RH STG SKIN FREE OF INFECTION/BREAKDOWN Description: Skin to remain free from infection and breakdown while on rehab with supervision assist. Outcome: Progressing Goal: RH STG MAINTAIN SKIN INTEGRITY WITH ASSISTANCE Description: STG Maintain Skin Integrity With mod I Assistance. Outcome: Progressing Goal: RH STG ABLE TO PERFORM INCISION/WOUND CARE W/ASSISTANCE Description: STG Able To Perform Incision/Wound Care With supervision Assistance. Outcome: Progressing   Problem: RH SAFETY Goal: RH STG ADHERE TO SAFETY PRECAUTIONS W/ASSISTANCE/DEVICE Description: STG Adhere to Safety Precautions With mod I Assistance/Device. Outcome: Progressing Goal: RH STG DECREASED RISK OF FALL WITH ASSISTANCE Description: STG Decreased Risk of Fall With mod I Assistance. Outcome: Progressing   Problem: RH PAIN MANAGEMENT Goal: RH STG PAIN MANAGED AT OR BELOW PT'S PAIN GOAL Description: Pain scale <3/10 Outcome: Progressing

## 2020-09-30 NOTE — Plan of Care (Signed)
°  Problem: Consults Goal: RH SPINAL CORD INJURY PATIENT EDUCATION Description:  See Patient Education module for education specifics.  Outcome: Progressing   Problem: SCI BOWEL ELIMINATION Goal: RH STG MANAGE BOWEL WITH ASSISTANCE Description: STG Manage Bowel with supervision Assistance. Outcome: Progressing Goal: RH STG SCI MANAGE BOWEL WITH MEDICATION WITH ASSISTANCE Description: STG SCI Manage bowel with medication with mod I assistance. Outcome: Progressing Goal: RH STG MANAGE BOWEL W/EQUIPMENT W/ASSISTANCE Description: STG Manage Bowel With Equipment With mod I Assistance Outcome: Progressing   Problem: SCI BLADDER ELIMINATION Goal: RH STG MANAGE BLADDER WITH ASSISTANCE Description: STG Manage Bladder With mod I Assistance Outcome: Progressing   Problem: RH SKIN INTEGRITY Goal: RH STG SKIN FREE OF INFECTION/BREAKDOWN Description: Skin to remain free from infection and breakdown while on rehab with supervision assist. Outcome: Progressing Goal: RH STG MAINTAIN SKIN INTEGRITY WITH ASSISTANCE Description: STG Maintain Skin Integrity With mod I Assistance. Outcome: Progressing Goal: RH STG ABLE TO PERFORM INCISION/WOUND CARE W/ASSISTANCE Description: STG Able To Perform Incision/Wound Care With supervision Assistance. Outcome: Progressing   Problem: RH SAFETY Goal: RH STG ADHERE TO SAFETY PRECAUTIONS W/ASSISTANCE/DEVICE Description: STG Adhere to Safety Precautions With mod I Assistance/Device. Outcome: Progressing Goal: RH STG DECREASED RISK OF FALL WITH ASSISTANCE Description: STG Decreased Risk of Fall With mod I Assistance. Outcome: Progressing   Problem: RH PAIN MANAGEMENT Goal: RH STG PAIN MANAGED AT OR BELOW PT'S PAIN GOAL Description: Pain scale <3/10 Outcome: Progressing   Problem: RH KNOWLEDGE DEFICIT SCI Goal: RH STG INCREASE KNOWLEDGE OF SELF CARE AFTER SCI Description: Patient will be educated on risks of SCI  Outcome: Progressing

## 2020-09-30 NOTE — Progress Notes (Signed)
[facility] PHYSICAL MEDICINE & REHABILITATION PROGRESS NOTE   Subjective/Complaints:  Patient happy visiting with his granddaughter.  He feels like he has made some good progress with his strength.   ROS:  Pt denies SOB, abd pain, CP, N/V/C/D, and vision changes    Objective:   No results found. No results for input(s): WBC, HGB, HCT, PLT in the last 72 hours. No results for input(s): NA, K, CL, CO2, GLUCOSE, BUN, CREATININE, CALCIUM in the last 72 hours.  Intake/Output Summary (Last 24 hours) at 09/30/2020 1443 Last data filed at 09/30/2020 1220 Gross per 24 hour  Intake 920 ml  Output 300 ml  Net 620 ml        Physical Exam: Vital Signs Blood pressure 125/65, pulse 85, temperature 97.9 F (36.6 C), resp. rate 18, height 5\' 9"  (1.753 m), weight 102 kg, SpO2 99 %.  General: sitting up in bedside chair, very depressed affect, near tears, NAD HEENT: decreased sensation V1/2/3 on L side- L eye shiny from ointment  General: No acute distress Mood and affect are appropriate Heart: Regular rate and rhythm no rubs murmurs or extra sounds Lungs: Clear to auscultation, breathing unlabored, no rales or wheezes Abdomen: Positive bowel sounds, soft nontender to palpation, nondistended Extremities: No clubbing, cyanosis, or edema     Musculoskeletal:  Comments: Well healed old left knee surgical scars. Left fore foot and great toe with mild erythema--minimal tenderness to touch.  UEs- at least 4/5 in UE's and LE's- due to neck incision, pt asked for no sig. Resistance to be used. Appears equal except L ankle from gout Very TTP over L upper trap, levators, and some in scalenes - all on L- not exact areas I already injected, but close by.  L ankle DF/PF 3-4/5- due to gout Skin: no skin breakdown on heels B/L IV's out Neurological:  Mental Status: HOx3  Comments: Mild left facial weakness with dysphonia noted. Left sided weakness with sensory  deficits. Psychiatric:  Comments: sad/flat affect due to pain- but better      Assessment/Plan: 1. Functional deficits which require 3+ hours per day of interdisciplinary therapy in a comprehensive inpatient rehab setting.  Physiatrist is providing close team supervision and 24 hour management of active medical problems listed below.  Physiatrist and rehab team continue to assess barriers to discharge/monitor patient progress toward functional and medical goals  Care Tool:  Bathing    Body parts bathed by patient: Face, Right arm, Left arm, Chest, Abdomen   Body parts bathed by helper: Right arm, Left arm, Chest, Abdomen, Buttocks, Right lower leg, Left lower leg     Bathing assist Assist Level: Minimal Assistance - Patient > 75% (Min A for UB, would be Max for LB)     Upper Body Dressing/Undressing Upper body dressing   What is the patient wearing?: Pull over shirt    Upper body assist Assist Level: Supervision/Verbal cueing    Lower Body Dressing/Undressing Lower body dressing      What is the patient wearing?: Pants     Lower body assist Assist for lower body dressing: Moderate Assistance - Patient 50 - 74%     Toileting Toileting Toileting Activity did not occur (Clothing management and hygiene only): N/A (no void or bm)  Toileting assist Assist for toileting: Moderate Assistance - Patient 50 - 74% Assistive Device Comment: urinal   Transfers Chair/bed transfer  Transfers assist     Chair/bed transfer assist level: Contact Guard/Touching assist Chair/bed transfer assistive device:  Walker   Locomotion Ambulation   Ambulation assist      Assist level: Contact Guard/Touching assist Assistive device: Walker-rolling Max distance: 200   Walk 10 feet activity   Assist     Assist level: Contact Guard/Touching assist Assistive device: Walker-rolling   Walk 50 feet activity   Assist Walk 50 feet with 2 turns activity did not occur:  Safety/medical concerns  Assist level: Contact Guard/Touching assist Assistive device: Walker-rolling    Walk 150 feet activity   Assist Walk 150 feet activity did not occur: Safety/medical concerns  Assist level: Minimal Assistance - Patient > 75% Assistive device: Walker-rolling    Walk 10 feet on uneven surface  activity   Assist Walk 10 feet on uneven surfaces activity did not occur: Safety/medical concerns   Assist level: Moderate Assistance - Patient - 50 - 74% Assistive device: Aeronautical engineer Will patient use wheelchair at discharge?:  (TBD but do not anticipate) Type of Wheelchair: Manual    Wheelchair assist level: Supervision/Verbal cueing Max wheelchair distance: 39'    Wheelchair 50 feet with 2 turns activity    Assist        Assist Level: Total Assistance - Patient < 25%   Wheelchair 150 feet activity     Assist  Wheelchair 150 feet activity did not occur: Safety/medical concerns       Blood pressure 125/65, pulse 85, temperature 97.9 F (36.6 C), resp. rate 18, height 5\' 9"  (1.753 m), weight 102 kg, SpO2 99 %.  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 -patient may Shower once doesn't need pressure dressing on posterior neck -ELOS/Goals: 10-12 days- Supervision to mod I 2. Antithrombotics: -DVT/anticoagulation:Mechanical:Sequential compression devices, below kneeBilateral lower extremities - will see, once bleeding stops from cervical incision, when can start Lovenox 11/16- Surgery was 11/7- has been 9 days- at high risk for DVT_ will start Lovenox. Called [facility]- didn't hear back so far. -antiplatelet therapy: N/A 3. Pain Management:Continue Neurontin TID with Oxycodone prn  11/12- will add flexeril scheduled for pain/spasms- if need be, will do trigger point injections this  weekend.  11/13- will try valium 5 mg BID - if not helpful, do TrP injections Had TPI 11/14  11/16- Trigger point injections again today- still very tight/painful  11/18- pain mujch better since injections- now "tolerable".   11/19- now having pain in L side of face- will increase Gabapentin to 600 mg BID- see if that helps.  4. Mood:LCSW to follow for evaluation and support. -antipsychotic agents: N/A 5. Neuropsych: This patientiscapable of making decisions on hisown behalf. 6. Skin/Wound Care:Routine pressure relief measures. Monitor wound for healing. 7. Fluids/Electrolytes/Nutrition:Monitor I/O. Appetite has been good. Check BMET in am.  8. HTN: Monitor BP tid--continue Toprol XL daily  11/12- BP well controlled- con't regimen 9. Gout flare: Continue colchicine bid--symptoms improving.Don't think will need Steroids  11/12- said much improved- no steroids required  11/14- resolved 10. T2DM: Will monitor BS ac/hs. BS poorly controlled--resume metformin. Continue to hold glucotrol. Will use SSI for elevated BS  11/12- jist restarted Metformin- BGs 160s-226- will give 1-2 days and see if better- then restart glucotrol.   11/13- a little better- max is 200- will add glucotrol tomorrow if not better   CBG (last 3)  Recent Labs    09/29/20 2108 09/30/20 0612 09/30/20 1133  GLUCAP 178* 121* 166*    Good in hospital control 11/20  11/18- BGs 147 to 197- will increase metformin to 850 mg BID  11/19- BGs slightly better- in 150s- con't regimen 11. Hyponatremia: Recheck labs in am.  12. Constipation: Will add Senna S.Might need sorbitol- no BM since last Friday.  11/12- LBM yesterday- con't regimen  11/13- LBM 2 days ago- if no BM by tomorrow, will try Sorbitol.   11/16- LBM yesterday but hard- will increase Colace to TID since still hard balls.   11/18- LBM this AM- was better 13. Neurogenic bladder vs outlet obstruction with  urinary retention- need to check  into this.Augment bowel program as needed.   11/13- requiring in and out caths due to PVRs >350cc- con't for now- max out flomax. Will help him retain LESS- will tell nursing.  11/13 flomax increased to 0.8mg  - monitor for orthostatic changes, thus far no sig effect  11/16- pt insisted that foley be placed yesterday- since caths so painful, even with lidocaine- will either remove later in week OR f/u with Urology after d/c- so far, U/A looks (-) for UTI.   11/19- will d/c foley Monday- since focused so much on L eye/facial issues  14. L eye pain/blurriness  11/18- have called and L/M for Ophthalmology- waiting to hear back- will see what the plan is from them- Dr Alanda Slim. .   11/19- could be nerve pain- Dr Alanda Slim found nothing wrong with eyes.  15. Vertigo  11/19- will add Vertigo evaluation by PT- does appear to have good ability right himself with pushing him to each side/front/back- while sitting. But could be inner ear? Will check with PT    LOS: 9 days A FACE TO FACE EVALUATION WAS PERFORMED  Charlett Blake 09/30/2020, 2:43 PM

## 2020-10-01 ENCOUNTER — Encounter (HOSPITAL_COMMUNITY): Payer: Medicare HMO | Admitting: Occupational Therapy

## 2020-10-01 LAB — GLUCOSE, CAPILLARY
Glucose-Capillary: 136 mg/dL — ABNORMAL HIGH (ref 70–99)
Glucose-Capillary: 142 mg/dL — ABNORMAL HIGH (ref 70–99)
Glucose-Capillary: 172 mg/dL — ABNORMAL HIGH (ref 70–99)
Glucose-Capillary: 199 mg/dL — ABNORMAL HIGH (ref 70–99)

## 2020-10-01 NOTE — Progress Notes (Signed)
Occupational Therapy Session Note  Patient Details  Name: [name] [name]. MRN: [id number] Date of Birth: [birth year]  Today's Date: 10/01/2020 OT Individual Time: 0900-1013 OT Individual Time Calculation (min): 73 min    Short Term Goals: Week 1:  OT Short Term Goal 1 (Week 1): Pt will complete a toilet transfer with 1 assist and LRAD OT Short Term Goal 2 (Week 1): Pt will complete UB dressing with Mod A OT Short Term Goal 3 (Week 1): Pt will complete 1/3 components of donning pants with supervision assist  Skilled Therapeutic Interventions/Progress Updates:    Pt greeted in the w/c, agreeable to extra therapy time today, motivated to shower. Using RW he ambulated with CGA into the bathroom where he transferred to the padded TTB. Pt then bathed at sit<stand level with close supervision assist for standing balance, using LH sponge to thoroughly reach his feet in seated. He ambulated to the w/c parked beside sink after where he then proceeded with dressing tasks. Pt used the sock aide with setup assistance to don his socks, able to lean forward enough to adjust socks and don sneakers. Per pt, at home he always slips on his shoes because they are loosely tied. OT set him up in the same way during session. UB dressing completed with setup assist, supervision for dynamic standing balance during LB dressing. Oral care/grooming tasks completed w/c level without assistance. Left him sitting up in the w/c, affect visibly brightened, stating he felt like "a million dollars." OT provided him with lavender aromatherapy to help him "relax" per pt request. Tx focus placed on self care retraining, functional transfers, and dynamic balance.    Therapy Documentation Precautions:  Precautions Precautions: Fall, Cervical, Other (comment) Precaution Comments: very painful neck with all movement; has vertebral artery stenosis Required Braces or Orthoses:  (per orders, no brace needed) Restrictions Weight  Bearing Restrictions: No Other Position/Activity Restrictions: no brace needed Pain: pt reported being medicated for pain before OT arrival   ADL: ADL Eating: Not assessed Grooming: Supervision/safety Where Assessed-Grooming: Sitting at sink Upper Body Bathing: Maximal assistance Where Assessed-Upper Body Bathing: Sitting at sink Lower Body Bathing: Maximal assistance Where Assessed-Lower Body Bathing: Sitting at sink, Standing at sink Upper Body Dressing: Maximal assistance Where Assessed-Upper Body Dressing: Sitting at sink Lower Body Dressing: Maximal assistance Where Assessed-Lower Body Dressing: Sitting at sink, Standing at sink Toileting: Not assessed Toilet Transfer: Not assessed Tub/Shower Transfer: Not assessed ADL Comments: Functional assessment profoundly limited by pain      Therapy/Group: Individual Therapy  Viktorya Arguijo A Emanuell Morina 10/01/2020, 12:48 PM

## 2020-10-01 NOTE — Progress Notes (Signed)
[facility] PHYSICAL MEDICINE & REHABILITATION PROGRESS NOTE   Subjective/Complaints:  No issues overnite, pt states he is voiding post foley removal No pain c/os  ROS:  Pt denies SOB, abd pain, CP, N/V/C/D, and vision changes    Objective:   No results found. No results for input(s): WBC, HGB, HCT, PLT in the last 72 hours. No results for input(s): NA, K, CL, CO2, GLUCOSE, BUN, CREATININE, CALCIUM in the last 72 hours.  Intake/Output Summary (Last 24 hours) at 10/01/2020 1302 Last data filed at 10/01/2020 1215 Gross per 24 hour  Intake 1006 ml  Output 450 ml  Net 556 ml        Physical Exam: Vital Signs Blood pressure 130/77, pulse 90, temperature 97.6 F (36.4 C), temperature source Oral, resp. rate 16, height 5\' 9"  (1.753 m), weight 102 kg, SpO2 98 %.  General: sitting up in bedside chair, very depressed affect, near tears, NAD HEENT: decreased sensation V1/2/3 on L side- L eye shiny from ointment  General: No acute distress Mood and affect are appropriate Heart: Regular rate and rhythm no rubs murmurs or extra sounds Lungs: Clear to auscultation, breathing unlabored, no rales or wheezes Abdomen: Positive bowel sounds, soft nontender to palpation, nondistended Extremities: No clubbing, cyanosis, or edema   Musculoskeletal:  Comments: Well healed old left knee surgical scars. Left fore foot and great toe with mild erythema--minimal tenderness to touch.  UEs- at least 4/5 bilateral biceps, triceps, delt, grip, HF, KE, ADF  Skin: no skin breakdown on heels B/L IV's out Neurological:  Mental Status: HOx3  Comments: Mild left facial weakness with dysphonia noted. Left sided weakness with sensory deficits. Psychiatric:  Comments: sad/flat affect due to pain- but better      Assessment/Plan: 1. Functional deficits which require 3+ hours per day of interdisciplinary therapy in a comprehensive inpatient rehab setting.  Physiatrist is  providing close team supervision and 24 hour management of active medical problems listed below.  Physiatrist and rehab team continue to assess barriers to discharge/monitor patient progress toward functional and medical goals  Care Tool:  Bathing    Body parts bathed by patient: Face, Right arm, Left arm, Chest, Abdomen, Front perineal area, Buttocks, Right upper leg, Left upper leg, Right lower leg, Left lower leg   Body parts bathed by helper: Right arm, Left arm, Chest, Abdomen, Buttocks, Right lower leg, Left lower leg     Bathing assist Assist Level: Supervision/Verbal cueing     Upper Body Dressing/Undressing Upper body dressing   What is the patient wearing?: Pull over shirt    Upper body assist Assist Level: Set up assist    Lower Body Dressing/Undressing Lower body dressing      What is the patient wearing?: Pants     Lower body assist Assist for lower body dressing: Supervision/Verbal cueing     Toileting Toileting Toileting Activity did not occur (Clothing management and hygiene only): N/A (no void or bm)  Toileting assist Assist for toileting: Moderate Assistance - Patient 50 - 74% Assistive Device Comment: urinal   Transfers Chair/bed transfer  Transfers assist     Chair/bed transfer assist level: Contact Guard/Touching assist Chair/bed transfer assistive device: Programmer, multimedia   Ambulation assist      Assist level: Contact Guard/Touching assist Assistive device: Walker-rolling Max distance: 200   Walk 10 feet activity   Assist     Assist level: Contact Guard/Touching assist Assistive device: Walker-rolling   Walk 50 feet activity  Assist Walk 50 feet with 2 turns activity did not occur: Safety/medical concerns  Assist level: Contact Guard/Touching assist Assistive device: Walker-rolling    Walk 150 feet activity   Assist Walk 150 feet activity did not occur: Safety/medical concerns  Assist level: Minimal  Assistance - Patient > 75% Assistive device: Walker-rolling    Walk 10 feet on uneven surface  activity   Assist Walk 10 feet on uneven surfaces activity did not occur: Safety/medical concerns   Assist level: Moderate Assistance - Patient - 50 - 74% Assistive device: Aeronautical engineer Will patient use wheelchair at discharge?:  (TBD but do not anticipate) Type of Wheelchair: Manual    Wheelchair assist level: Supervision/Verbal cueing Max wheelchair distance: 35'    Wheelchair 50 feet with 2 turns activity    Assist        Assist Level: Total Assistance - Patient < 25%   Wheelchair 150 feet activity     Assist  Wheelchair 150 feet activity did not occur: Safety/medical concerns       Blood pressure 130/77, pulse 90, temperature 97.6 F (36.4 C), temperature source Oral, resp. rate 16, height 5\' 9"  (1.753 m), weight 102 kg, SpO2 98 %.  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 -patient may Shower once doesn't need pressure dressing on posterior neck -ELOS/Goals: 11/23 Supervision to mod I 2. Antithrombotics: -DVT/anticoagulation:Mechanical:Sequential compression devices, below kneeBilateral lower extremities - will see, once bleeding stops from cervical incision, when can start Lovenox 11/16- Surgery was 11/7- has been 9 days- at high risk for DVT_ will start Lovenox. Called [facility]- didn't hear back so far. -antiplatelet therapy: N/A 3. Pain Management:Continue Neurontin TID with Oxycodone prn  11/12- will add flexeril scheduled for pain/spasms- if need be, will do trigger point injections this weekend.  11/13- will try valium 5 mg BID - if not helpful, do TrP injections Had TPI 11/14  11/16- Trigger point injections again today- still very tight/painful  11/18- pain mujch better since injections- now  "tolerable".   11/19- now having pain in L side of face- will increase Gabapentin to 600 mg BID- see if that helps.  4. Mood:LCSW to follow for evaluation and support. -antipsychotic agents: N/A 5. Neuropsych: This patientiscapable of making decisions on hisown behalf. 6. Skin/Wound Care:Routine pressure relief measures. Monitor wound for healing. 7. Fluids/Electrolytes/Nutrition:Monitor I/O. Appetite has been good. Check BMET in am.  8. HTN: Monitor BP tid--continue Toprol XL daily  11/12- BP well controlled- con't regimen 9. Gout flare: Continue colchicine bid--symptoms improving.Don't think will need Steroids  11/12- said much improved- no steroids required  11/14- resolved 10. T2DM: Will monitor BS ac/hs. BS poorly controlled--resume metformin. Continue to hold glucotrol. Will use SSI for elevated BS  11/12- jist restarted Metformin- BGs 160s-226- will give 1-2 days and see if better- then restart glucotrol.   11/13- a little better- max is 200- will add glucotrol tomorrow if not better   CBG (last 3)  Recent Labs    09/30/20 2140 10/01/20 0610 10/01/20 1217  GLUCAP 221* 136* 142*    Good in hospital control 11/21  11/18- BGs 147 to 197- will increase metformin to 850 mg BID  11/19- BGs slightly better- in 150s- con't regimen 11. Hyponatremia: Recheck labs in am.  12. Constipation: Will add Senna S.Might need sorbitol- no BM since last Friday.  11/21 last BM 11/20, cont type 4, large 13. Neurogenic  bladder vs outlet obstruction with  urinary retention-   11/21 voiding without foley BVI 300-331ml  14. L eye pain/blurriness  11/18- have called and L/M for Ophthalmology- waiting to hear back- will see what the plan is from them- Dr Alanda Slim. .   11/19- could be nerve pain- Dr Alanda Slim found nothing wrong with eyes.  15. Vertigo  11/19- will add Vertigo evaluation by PT- does appear to have good ability right himself with pushing him to each side/front/back-  while sitting. But could be inner ear? Will check with PT    LOS: 10 days A FACE TO FACE EVALUATION WAS PERFORMED  Charlett Blake 10/01/2020, 1:02 PM

## 2020-10-01 NOTE — Plan of Care (Signed)
  Problem: Consults Goal: RH SPINAL CORD INJURY PATIENT EDUCATION Description:  See Patient Education module for education specifics.  Outcome: Progressing   Problem: SCI BOWEL ELIMINATION Goal: RH STG MANAGE BOWEL WITH ASSISTANCE Description: STG Manage Bowel with supervision Assistance. Outcome: Progressing Goal: RH STG SCI MANAGE BOWEL WITH MEDICATION WITH ASSISTANCE Description: STG SCI Manage bowel with medication with mod I assistance. Outcome: Progressing Goal: RH STG MANAGE BOWEL W/EQUIPMENT W/ASSISTANCE Description: STG Manage Bowel With Equipment With mod I Assistance Outcome: Progressing   Problem: SCI BLADDER ELIMINATION Goal: RH STG MANAGE BLADDER WITH ASSISTANCE Description: STG Manage Bladder With mod I Assistance Outcome: Progressing   Problem: RH SKIN INTEGRITY Goal: RH STG SKIN FREE OF INFECTION/BREAKDOWN Description: Skin to remain free from infection and breakdown while on rehab with supervision assist. Outcome: Progressing Goal: RH STG MAINTAIN SKIN INTEGRITY WITH ASSISTANCE Description: STG Maintain Skin Integrity With mod I Assistance. Outcome: Progressing Goal: RH STG ABLE TO PERFORM INCISION/WOUND CARE W/ASSISTANCE Description: STG Able To Perform Incision/Wound Care With supervision Assistance. Outcome: Progressing   Problem: RH SAFETY Goal: RH STG ADHERE TO SAFETY PRECAUTIONS W/ASSISTANCE/DEVICE Description: STG Adhere to Safety Precautions With mod I Assistance/Device. Outcome: Progressing Goal: RH STG DECREASED RISK OF FALL WITH ASSISTANCE Description: STG Decreased Risk of Fall With mod I Assistance. Outcome: Progressing   Problem: RH PAIN MANAGEMENT Goal: RH STG PAIN MANAGED AT OR BELOW PT'S PAIN GOAL Description: Pain scale <3/10 Outcome: Progressing   Problem: RH KNOWLEDGE DEFICIT SCI Goal: RH STG INCREASE KNOWLEDGE OF SELF CARE AFTER SCI Description: Patient will be educated on risks of SCI  Outcome: Progressing

## 2020-10-01 NOTE — Progress Notes (Signed)
Occupational Therapy Session Note  Patient Details  Name: [name] [name]. MRN: [id number] Date of Birth: Dec 03, 1945  Today's Date: 10/01/2020 OT Group Time: 1100-1200 OT Group Time Calculation (min): 60 min   Skilled Therapeutic Interventions/Progress Updates:    Pt engaged in therapeutic w/c level dance group focusing on patient choice, UE/LE strengthening, salience, activity tolerance, and social participation. Pt was guided through various dance-based exercises involving UEs/LEs and trunk. All music was selected by group members. Emphasis placed on activity tolerance, UE ROM, and psychosocial health. Pt participated well in group, requested music, and interacted pleasantly with others. He declined standing but did work his arms, legs, and trunk while seated. Pt was returned to the room at close of session by OT.      Therapy Documentation Precautions:  Precautions Precautions: Fall, Cervical, Other (comment) Precaution Comments: very painful neck with all movement; has vertebral artery stenosis Required Braces or Orthoses:  (per orders, no brace needed) Restrictions Weight Bearing Restrictions: No Other Position/Activity Restrictions: no brace needed Vital Signs: Therapy Vitals Temp: 97.6 F (36.4 C) Temp Source: Oral Pulse Rate: 85 Resp: 18 BP: 121/62 Patient Position (if appropriate): Sitting Oxygen Therapy SpO2: 100 % O2 Device: Room Air Pain: pt reported pain to be manageable for session without additional interventions   ADL: ADL Eating: Not assessed Grooming: Supervision/safety Where Assessed-Grooming: Sitting at sink Upper Body Bathing: Maximal assistance Where Assessed-Upper Body Bathing: Sitting at sink Lower Body Bathing: Maximal assistance Where Assessed-Lower Body Bathing: Sitting at sink, Standing at sink Upper Body Dressing: Maximal assistance Where Assessed-Upper Body Dressing: Sitting at sink Lower Body Dressing: Maximal assistance Where  Assessed-Lower Body Dressing: Sitting at sink, Standing at sink Toileting: Not assessed Toilet Transfer: Not assessed Tub/Shower Transfer: Not assessed ADL Comments: Functional assessment profoundly limited by pain      Therapy/Group: Group Therapy  Lillias Difrancesco A Emmalyn Hinson 10/01/2020, 3:57 PM

## 2020-10-02 ENCOUNTER — Encounter (HOSPITAL_COMMUNITY): Payer: Medicare HMO | Admitting: Psychology

## 2020-10-02 ENCOUNTER — Inpatient Hospital Stay (HOSPITAL_COMMUNITY): Payer: Medicare HMO | Admitting: Occupational Therapy

## 2020-10-02 ENCOUNTER — Inpatient Hospital Stay (HOSPITAL_COMMUNITY): Payer: Medicare HMO

## 2020-10-02 LAB — BASIC METABOLIC PANEL
Anion gap: 10 (ref 5–15)
BUN: 12 mg/dL (ref 8–23)
CO2: 26 mmol/L (ref 22–32)
Calcium: 8.9 mg/dL (ref 8.9–10.3)
Chloride: 100 mmol/L (ref 98–111)
Creatinine, Ser: 0.87 mg/dL (ref 0.61–1.24)
GFR, Estimated: >60 mL/min (ref >60)
Glucose, Bld: 155 mg/dL — ABNORMAL HIGH (ref 70–99)
Potassium: 3.8 mmol/L (ref 3.5–5.1)
Sodium: 136 mmol/L (ref 135–145)

## 2020-10-02 LAB — CBC
HCT: 34.6 % — ABNORMAL LOW (ref 39.0–52.0)
Hemoglobin: 11.1 g/dL — ABNORMAL LOW (ref 13.0–17.0)
MCH: 28 pg (ref 26.0–34.0)
MCHC: 32.1 g/dL (ref 30.0–36.0)
MCV: 87.4 fL (ref 80.0–100.0)
Platelets: 301 10*3/uL (ref 150–400)
RBC: 3.96 MIL/uL — ABNORMAL LOW (ref 4.22–5.81)
RDW: 13.2 % (ref 11.5–15.5)
WBC: 7.7 10*3/uL (ref 4.0–10.5)
nRBC: 0 % (ref 0.0–0.2)

## 2020-10-02 LAB — GLUCOSE, CAPILLARY
Glucose-Capillary: 166 mg/dL — ABNORMAL HIGH (ref 70–99)
Glucose-Capillary: 188 mg/dL — ABNORMAL HIGH (ref 70–99)
Glucose-Capillary: 141 mg/dL — ABNORMAL HIGH (ref 70–99)
Glucose-Capillary: 147 mg/dL — ABNORMAL HIGH (ref 70–99)

## 2020-10-02 NOTE — Progress Notes (Signed)
Physical Therapy Discharge Summary  Patient Details  Name: [name] [name]. MRN: [id number] Date of Birth: December 28, 1945  Today's Date: 10/02/2020 PT Individual Time: 1410-1505 PT Individual Time Calculation (min): 55 min   Skilled Therapeutic Intervention: Patient in w/c in room, reports feeling well and ready for d/c.  Sit to stand to RW with S and ambulated to therapy gym with S with RW occasional cues for negotiating around obstacles on the L.  Negotiated 4 steps with 2 rails and CGA for safety and cues for technique.  Patient seated for balance and strength assessment.  Sit to stand to RW S and ambulated to ortho gym 200' CGA around door facing with obstacles immediately to L with distraction on R and mild LOB to L.  Educated pt on focus on task and issues when distracted or fatigued with increased fall risk.  Patient performed car transfer with S and negotiated ramp x 40' (20' x2) with S.  Patient ambulated to room x 250' with S with RW.  Performed bed mobility S.  Stand pivot to w/c with S.  Called daughter and discussed need for supervision with assist on L side and occasional cues for safety due to running into obstacles on L and distraction with LOB.  She reported good understanding and need to relay information to other family who will be assisting him.  She also asked about HHPT and equipment and educated plans for obtaining shower seat from outside vendor and PT would follow up with SW about HHPT.  Patient left seated in w/c with call bell in reach and chair alarm activated.   Patient has met 8 of 8 long term goals due to improved activity tolerance, improved balance, improved postural control, increased strength, decreased pain, ability to compensate for deficits and improved attention.  Patient to discharge at an ambulatory level Supervision.   Patient's care partner reports understanding to provide the necessary physical assistance at discharge.  Reasons goals not met: Goals met,  patient in addition was able to negotiate 4 steps with rails and CGA as well as verbalize understanding of fall prevention and calling 911 if he falls. Spoke with daughter via phone regarding need for supervision and attention to L side when walking with RW in the home, transfers and ADL's.  Recommendation:  Patient will benefit from ongoing skilled PT services in home health setting to continue to advance safe functional mobility, address ongoing impairments in balance, awareness, L LE strength, and minimize fall risk.  Equipment: No equipment provided  Reasons for discharge: treatment goals met and discharge from hospital  Patient/family agrees with progress made and goals achieved: Yes  PT Discharge Precautions/Restrictions Precautions Precautions: Fall;Cervical;Other (comment) (L lean at times) Required Braces or Orthoses: Other Brace Other Brace: wearing knee brace from home on L knee Restrictions Weight Bearing Restrictions: No Vital Signs Therapy Vitals Temp: 98.3 F (36.8 C) Temp Source: Oral Pulse Rate: 79 Resp: 18 BP: 127/68 Patient Position (if appropriate): Sitting Oxygen Therapy SpO2: 100 % O2 Device: Room Air Pain Pain Assessment Pain Scale: 0-10 Pain Score: 0-No pain Vision/Perception  Perception Perception: Impaired Inattention/Neglect: Does not attend to left visual field (at times runs into obstacles on L) Praxis Praxis: Intact  Cognition Overall Cognitive Status: Within Functional Limits for tasks assessed Arousal/Alertness: Awake/alert Orientation Level: Oriented X4 Attention: Sustained Sustained Attention: Appears intact Memory: Appears intact Problem Solving: Appears intact Safety/Judgment: Appears intact Sensation Sensation Light Touch: Appears Intact Proprioception: Appears Intact Coordination Gross Motor Movements are Fluid  and Coordinated: Yes Fine Motor Movements are Fluid and Coordinated: No Heel Shin Test: intact R compared to L  with heel to shin and toe tapping Motor  Motor Motor: Abnormal postural alignment and control Motor - Skilled Clinical Observations: Continues to have some stiffness, L lean, and discomfort at times in neck/shoulders but significantly improved from eval Motor - Discharge Observations: cervical and shoulder stiffness, L lean, keeping wide BOS  Mobility Bed Mobility Bed Mobility: Supine to Sit;Sit to Supine Supine to Sit: Supervision/Verbal cueing Sit to Supine: Supervision/Verbal cueing Transfers Transfers: Sit to Stand;Stand Pivot Transfers;Stand to Sit Sit to Stand: Supervision/Verbal cueing Stand to Sit: Supervision/Verbal cueing Stand Pivot Transfers: Supervision/Verbal cueing Stand Pivot Transfer Details: Verbal cues for precautions/safety Transfer (Assistive device): Rolling walker Locomotion  Gait Ambulation: Yes Gait Assistance: Supervision/Verbal cueing Gait Distance (Feet): 150 Feet Assistive device: Rolling walker Gait Assistance Details: Verbal cues for precautions/safety Gait Assistance Details: cues for obstacles on L and guarding assist for safety at times with fatigue and distances >150' Gait Gait: Yes Gait Pattern: Impaired Gait Pattern: Decreased weight shift to right;Lateral trunk lean to left;Left flexed knee in stance;Decreased stride length;Step-through pattern;Wide base of support Stairs / Additional Locomotion Stairs: Yes Stairs Assistance: Contact Guard/Touching assist Stair Management Technique: Two rails;Forwards Number of Stairs: 4 Height of Stairs: 6 Ramp: Supervision/Verbal cueing Wheelchair Mobility Wheelchair Mobility: No  Trunk/Postural Assessment  Cervical Assessment Cervical Assessment: Exceptions to [facility] (cervical precautions, stiffness but improved) Thoracic Assessment Thoracic Assessment: Exceptions to [facility] (squared shoulders tight in UT's) Lumbar Assessment Lumbar Assessment: Exceptions to [facility] (flattened L spine) Postural  Control Postural Control: Within Functional Limits  Balance Balance Balance Assessed: Yes Static Sitting Balance Static Sitting - Balance Support: Feet supported Static Sitting - Level of Assistance: 6: Modified independent (Device/Increase time) Dynamic Sitting Balance Dynamic Sitting - Balance Support: Feet supported;During functional activity Dynamic Sitting - Level of Assistance: 6: Modified independent (Device/Increase time) Static Standing Balance Static Standing - Balance Support: Bilateral upper extremity supported Static Standing - Level of Assistance: 5: Stand by assistance Dynamic Standing Balance Dynamic Standing - Balance Support: During functional activity;No upper extremity supported Dynamic Standing - Level of Assistance: 5: Stand by assistance Dynamic Standing - Balance Activities: Other (comment);Lateral lean/weight shifting;Reaching for objects Dynamic Standing - Comments: turning to look over shoulder and reaching to touch targets Extremity Assessment  RUE Assessment RUE Assessment: Within Functional Limits (RUE WFL however, limited at PLOF by old work related injury but able to perform ADLs within hins ROM) LUE Assessment LUE Assessment:  (limied by old RTC repairs, but however functional for ADL tasks, improved ROM compared to eval and not limited by pain) RLE Assessment RLE Assessment: Within Functional Limits Active Range of Motion (AROM) Comments: WNL General Strength Comments: 5/5 assessed in sitting LLE Assessment LLE Assessment: Exceptions to [facility] Active Range of Motion (AROM) Comments: limited knee extension (chronic per pt) LLE Strength Left Hip Flexion: 4-/5 Left Knee Flexion: 4/5 Left Knee Extension: 4+/5 Left Ankle Dorsiflexion: 4+/5    Reginia Naas  Magda Kiel, PT 10/02/2020, 5:23 PM

## 2020-10-02 NOTE — Progress Notes (Signed)
[place] PHYSICAL MEDICINE & REHABILITATION PROGRESS NOTE   Subjective/Complaints:  Pt has refused a couple times in last 24 hours to be cathed- but has voided a few times as well.  Got foley out yesterday.  BM this AM- ate 100% breakfast.  Pt's daughter upset we discussed with pt/wife that I thought he had a stroke, based on findings PT, OT see as well as my experience with MRI negative strokes- which I and my colleagues see ~2-3x/year.  We attempted as a team to explain to daughter but she thinks we "think we know more than all the neurologists" that said he didn't have a stroke.   Pt appears to be caught in the middle- so made claims this AM that vision is "back to normal" and "balance is fine"- which is good, however I'm concerned because these have been issues off and on since admission.  Pt also insistent ready to leave tomorrow.   ROS:  Pt denies SOB, abd pain, CP, N/V/C/D, and vision changes    Objective:   No results found. Recent Labs    10/02/20 0715  WBC 7.7  HGB 11.1*  HCT 34.6*  PLT 301   Recent Labs    10/02/20 0715  NA 136  K 3.8  CL 100  CO2 26  GLUCOSE 155*  BUN 12  CREATININE 0.87  CALCIUM 8.9    Intake/Output Summary (Last 24 hours) at 10/02/2020 1943 Last data filed at 10/02/2020 1809 Gross per 24 hour  Intake 1020 ml  Output 1075 ml  Net -55 ml        Physical Exam: Vital Signs Blood pressure 136/76, pulse 88, temperature 98.3 F (36.8 C), resp. rate 17, height 5\' 9"  (1.753 m), weight 101.1 kg, SpO2 99 %.  General: sitting up in bed-rushed because in with Neuro-psychology, NAD HEENT: decreased sensation V1/2/3 on L side-no change Mood and affect are almost too happy Heart: RRR Lungs: CTA B/L- no W/R/R- good air movement Abdomen: Soft, NT, ND, (+)BS  Extremities: No clubbing, cyanosis, or edema   Musculoskeletal:  Comments: Well healed old left knee surgical scars. Left fore foot and great toe with mild erythema--minimal  tenderness to touch.  UEs- at least 4/5 bilateral biceps, triceps, delt, grip, HF, KE, ADF  Skin: no skin breakdown on heels B/L IV's out Neurological:  Mental Status: HOx3  Comments: Mild left facial weakness with dysphonia noted. Left sided weakness with sensory deficits.       Assessment/Plan: 1. Functional deficits which require 3+ hours per day of interdisciplinary therapy in a comprehensive inpatient rehab setting.  Physiatrist is providing close team supervision and 24 hour management of active medical problems listed below.  Physiatrist and rehab team continue to assess barriers to discharge/monitor patient progress toward functional and medical goals  Care Tool:  Bathing    Body parts bathed by patient: Face, Right arm, Left arm, Chest, Abdomen, Front perineal area, Buttocks, Right upper leg, Left upper leg, Right lower leg, Left lower leg   Body parts bathed by helper: Right arm, Left arm, Chest, Abdomen, Buttocks, Right lower leg, Left lower leg     Bathing assist Assist Level: Supervision/Verbal cueing     Upper Body Dressing/Undressing Upper body dressing   What is the patient wearing?: Pull over shirt    Upper body assist Assist Level: Set up assist    Lower Body Dressing/Undressing Lower body dressing      What is the patient wearing?: Pants  Lower body assist Assist for lower body dressing: Supervision/Verbal cueing     Toileting Toileting Toileting Activity did not occur (Clothing management and hygiene only): N/A (no void or bm)  Toileting assist Assist for toileting: Supervision/Verbal cueing Assistive Device Comment: urinal   Transfers Chair/bed transfer  Transfers assist     Chair/bed transfer assist level: Supervision/Verbal cueing Chair/bed transfer assistive device: Programmer, multimedia   Ambulation assist      Assist level: Supervision/Verbal cueing Assistive device: Walker-rolling Max distance:  150   Walk 10 feet activity   Assist     Assist level: Supervision/Verbal cueing Assistive device: Walker-rolling   Walk 50 feet activity   Assist Walk 50 feet with 2 turns activity did not occur: Safety/medical concerns  Assist level: Supervision/Verbal cueing Assistive device: Walker-rolling    Walk 150 feet activity   Assist Walk 150 feet activity did not occur: Safety/medical concerns  Assist level: Supervision/Verbal cueing Assistive device: Walker-rolling    Walk 10 feet on uneven surface  activity   Assist Walk 10 feet on uneven surfaces activity did not occur: Safety/medical concerns   Assist level: Supervision/Verbal cueing Assistive device: Aeronautical engineer Will patient use wheelchair at discharge?: No Type of Wheelchair: Manual    Wheelchair assist level: Supervision/Verbal cueing Max wheelchair distance: 20'    Wheelchair 50 feet with 2 turns activity    Assist        Assist Level: Total Assistance - Patient < 25%   Wheelchair 150 feet activity     Assist  Wheelchair 150 feet activity did not occur: Safety/medical concerns       Blood pressure 136/76, pulse 88, temperature 98.3 F (36.8 C), resp. rate 17, height 5\' 9"  (1.753 m), weight 101.1 kg, SpO2 99 %.  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 -patient may Shower once doesn't need pressure dressing on posterior neck  11/22- healing well-  -ELOS/Goals: 11/23 Supervision to mod I 2. Antithrombotics: -DVT/anticoagulation:Mechanical:Sequential compression devices, below kneeBilateral lower extremities - will see, once bleeding stops from cervical incision, when can start Lovenox 11/16- Surgery was 11/7- has been 9 days- at high risk for DVT_ will start Lovenox. Called [facility]- didn't hear back so far. 11/22- walking well enough-  doesn't need to go home on Lovenox -antiplatelet therapy: N/A 3. Pain Management:Continue Neurontin TID with Oxycodone prn  11/12- will add flexeril scheduled for pain/spasms- if need be, will do trigger point injections this weekend.  11/13- will try valium 5 mg BID - if not helpful, do TrP injections Had TPI 11/14  11/16- Trigger point injections again today- still very tight/painful  11/18- pain mujch better since injections- now "tolerable".   11/19- now having pain in L side of face- will increase Gabapentin to 600 mg BID- see if that helps.   11/22- pt says doing much better- could be Gabapentin? 4. Mood:LCSW to follow for evaluation and support. -antipsychotic agents: N/A 5. Neuropsych: This patientiscapable of making decisions on hisown behalf. 6. Skin/Wound Care:Routine pressure relief measures. Monitor wound for healing. 7. Fluids/Electrolytes/Nutrition:Monitor I/O. Appetite has been good. Check BMET in am.  8. HTN: Monitor BP tid--continue Toprol XL daily  11/12- BP well controlled- con't regimen 9. Gout flare: Continue colchicine bid--symptoms improving.Don't think will need Steroids  11/12- said much improved- no steroids required  11/14- resolved 10. T2DM: Will monitor BS ac/hs. BS poorly controlled--resume metformin. Continue to hold  glucotrol. Will use SSI for elevated BS  11/12- jist restarted Metformin- BGs 160s-226- will give 1-2 days and see if better- then restart glucotrol.   11/13- a little better- max is 200- will add glucotrol tomorrow if not better   CBG (last 3)  Recent Labs    10/02/20 0601 10/02/20 1122 10/02/20 1651  GLUCAP 166* 188* 141*    Good in hospital control 11/21  11/18- BGs 147 to 197- will increase metformin to 850 mg BID  11/19- BGs slightly better- in 150s- con't regimen  11/22- family upset that we've given him insulin and feel this is why pt is emotional- they feel BGS 150-200 are good control and  doesn't and WILL NOT go home on anything but home regimen.  11. Hyponatremia: Recheck labs in am.  12. Constipation: Will add Senna S.Might need sorbitol- no BM since last Friday.  11/21 last BM 11/20, cont type 4, large 13. Neurogenic bladder vs outlet obstruction with  urinary retention-   11/21 voiding without foley BVI 300-329ml  11/22- Pt refused caths 2-3x in last 24 hours- but continues to void some- will arrange f/u with Urology.  14. L eye pain/blurriness  11/18- have called and L/M for Ophthalmology- waiting to hear back- will see what the plan is from them- Dr Alanda Slim. .   11/19- could be nerve pain- Dr Alanda Slim found nothing wrong with eyes.  15. Vertigo  11/19- will add Vertigo evaluation by PT- does appear to have good ability right himself with pushing him to each side/front/back- while sitting. But could be inner ear? Will check with PT  11/22- fell this AM- of note.     LOS: 11 days A FACE TO FACE EVALUATION WAS PERFORMED  [name] [name] 10/02/2020, 7:43 PM

## 2020-10-02 NOTE — Plan of Care (Signed)
  Problem: RH Balance Goal: LTG Patient will maintain dynamic sitting balance (PT) Description: LTG:  Patient will maintain dynamic sitting balance with assistance during mobility activities (PT) Outcome: Completed/Met   Problem: RH Balance Goal: LTG Patient will maintain dynamic standing balance (PT) Description: LTG:  Patient will maintain dynamic standing balance with assistance during mobility activities (PT) Outcome: Completed/Met   Problem: Sit to Stand Goal: LTG:  Patient will perform sit to stand with assistance level (PT) Description: LTG:  Patient will perform sit to stand with assistance level (PT) Outcome: Completed/Met   Problem: RH Bed Mobility Goal: LTG Patient will perform bed mobility with assist (PT) Description: LTG: Patient will perform bed mobility with assistance, with/without cues (PT). Outcome: Completed/Met   Problem: RH Bed to Chair Transfers Goal: LTG Patient will perform bed/chair transfers w/assist (PT) Description: LTG: Patient will perform bed to chair transfers with assistance (PT). Outcome: Completed/Met   Problem: RH Car Transfers Goal: LTG Patient will perform car transfers with assist (PT) Description: LTG: Patient will perform car transfers with assistance (PT). Outcome: Completed/Met   Problem: RH Ambulation Goal: LTG Patient will ambulate in controlled environment (PT) Description: LTG: Patient will ambulate in a controlled environment, # of feet with assistance (PT). Outcome: Completed/Met   Problem: RH Ambulation Goal: LTG Patient will ambulate in home environment (PT) Description: LTG: Patient will ambulate in home environment, # of feet with assistance (PT). Outcome: Completed/Met  Magda Kiel, PT

## 2020-10-02 NOTE — Progress Notes (Signed)
10/02/20 0300  What Happened  Was fall witnessed? Yes  Who witnessed fall? Ayiana Winslett RN  Patients activity before fall ambulating-assisted;bathroom-assisted  Point of contact hip/leg  Was patient injured? No  Follow Up  MD notified Marlowe Shores PA  Time MD notified 30  Family notified Yes - comment (Daughter )  Time family notified [phone]  Additional tests No  Progress note created (see row info) Yes  Adult Fall Risk Assessment  Risk Factor Category (scoring not indicated) Fall has occurred during this admission (document High fall risk)  Age 74  Fall History: Fall within 6 months prior to admission 5  Elimination; Bowel and/or Urine Incontinence 0  Elimination; Bowel and/or Urine Urgency/Frequency 0  Medications: includes PCA/Opiates, Anti-convulsants, Anti-hypertensives, Diuretics, Hypnotics, Laxatives, Sedatives, and Psychotropics 5  Patient Care Equipment 1  Mobility-Assistance 2  Mobility-Gait 2  Mobility-Sensory Deficit 0  Altered awareness of immediate physical environment 0  Impulsiveness 0  Lack of understanding of one's physical/cognitive limitations 0  Total Score 17  Patient Fall Risk Level High fall risk  Adult Fall Risk Interventions  Required Bundle Interventions *See Row Information* High fall risk - low, moderate, and high requirements implemented  Additional Interventions Use of appropriate toileting equipment (bedpan, BSC, etc.)  Screening for Fall Injury Risk (To be completed on HIGH fall risk patients) - Assessing Need for Low Bed  Risk For Fall Injury- Low Bed Criteria Admitted as a result of a fall  Will Implement Low Bed and Floor Mats No - Criteria no longer met for low bed  Pain Assessment  Pain Scale 0-10  Pain Score 0  Neurological  Neuro (WDL) X  Level of Consciousness Alert  Orientation Level Oriented X4  Cognition Appropriate at baseline  Speech Clear  Pupil Assessment  No  R Hand Grip Moderate  L Hand Grip Moderate   RUE Motor Response  Purposeful movement  RUE Sensation Full sensation  RUE Motor Strength 4  LUE Motor Response Purposeful movement  LUE Sensation Full sensation  LUE Motor Strength 4  RLE Motor Response Purposeful movement  RLE Sensation Full sensation  RLE Motor Strength 4  LLE Motor Response Purposeful movement  LLE Sensation Full sensation  LLE Motor Strength 4  Integumentary  Integumentary (WDL) X  Skin Color Appropriate for ethnicity  Skin Condition Dry  Skin Integrity Surgical Incision (see LDA)  Skin Turgor Non-tenting  Pain Assessment  Result of Injury No  Pain Screening  Clinical Progression Not changed

## 2020-10-02 NOTE — Progress Notes (Signed)
Occupational Therapy Session Note  Patient Details  Name: [name] [name]. MRN: [id number] Date of Birth: [birth year]  Today's Date: 10/02/2020 OT Individual Time: 0938-1829 OT Individual Time Calculation (min): 75 min    Short Term Goals: Week 1:  OT Short Term Goal 1 (Week 1): Pt will complete a toilet transfer with 1 assist and LRAD OT Short Term Goal 2 (Week 1): Pt will complete UB dressing with Mod A OT Short Term Goal 3 (Week 1): Pt will complete 1/3 components of donning pants with supervision assist   Skilled Therapeutic Interventions/Progress Updates:    Pt greeted at time of session sitting up in wheelchair napping, easily woken and agreeable to OT session. No c/o initially or throughout session. Declined ADL, stating he just had a shower yesterday and got dressed this am with previous OT session. Set up at sink level for oral hygiene and grooming tasks which he performed with set up assist. Ambulated approx 10 feet to bathroom with close supervisoin with RW, transferred to/from toilet in same manner and did not need to void at this time but reports doing so this morning with nursing staff and not having difficulty performing hygiene or clothing management. Extensive discussion regarding home set up, assistance and supervision from wife PRN, and DME/equipment needs as he is going to have daughter purchase a shower seat. Also reviewed use of AE and where to purchase if wanted. Transported to ADL apartment via wheelchair and performed simulated walk in shower transfer with close supervision to shower seat with good safety carryover. Ambulated back to room from ADL apartment close supervision with RW. Set up with alarm on, call bell in reach. No further questions and pt very excited to go home.   Therapy Documentation Precautions:  Precautions Precautions: Fall, Cervical, Other (comment) Precaution Comments: very painful neck with all movement; has vertebral artery stenosis Required  Braces or Orthoses:  (per orders, no brace needed) Restrictions Weight Bearing Restrictions: No Other Position/Activity Restrictions: no brace needed     Therapy/Group: Individual Therapy  Viona Gilmore 10/02/2020, 12:21 PM

## 2020-10-02 NOTE — Progress Notes (Addendum)
Bladder scan PVR showed 457mL requiring straight cath per orders.  Patient refusing being catheterized at this time even after being educated on risk for potential infection.

## 2020-10-02 NOTE — Progress Notes (Addendum)
Occupational Therapy Session Note  Patient Details  Name: [name] [name]. MRN: [id number] Date of Birth: May 20, 1946  Today's Date: 10/02/2020 OT Individual Time: 7673-4193 OT Individual Time Calculation (min): 41 min    Short Term Goals: Week 1:  OT Short Term Goal 1 (Week 1): Pt will complete a toilet transfer with 1 assist and LRAD OT Short Term Goal 2 (Week 1): Pt will complete UB dressing with Mod A OT Short Term Goal 3 (Week 1): Pt will complete 1/3 components of donning pants with supervision assist  Skilled Therapeutic Interventions/Progress Updates:    Pt seen for vestibular issues per MD request.  Pt up in wheelchair to start session.  Since admission into hospital, he reports having some episodes of hypotension or light headedness, but none that he can recall since on inpatient rehab.  He also reports no recent history of dizziness with functional movements.  Assessed visual tracking which was slightly jerky at times, but overall ocular ROM was WFLS as well as saccades.  He was able to maintain gaze stabilization with small head movements side to side as well as up and down.  These were very minimal secondary to his cervical precautions.  No nystagmus was noted with slight head shaking.  Head thrust test or VOR cancellation were not completed based on precautions.  BP was taken in sitting at 125/64 and then in standing at 136/79.  No report of light headedness or dizziness.  Had him complete transfer from the wheelchair over to the bed with supervision using the RW for support.  He then transitioned to supine as well as rolled side to side with supervision.  No reports of dizziness or nystagmus noted.  He transitioned back to sitting at supervision level and returned to the wheelchair at the same level.  Slow movements noted with transitions and sit to stand as well as for transition back to the wheelchair as pt states he does not want to lose his balance since he is weaker on the left  side.  Finished session with pt in the wheelchair and with the call button and phone in reach.  Per pt report, he has been doing gaze stabilization exercises in his room already and has an "A" on his wall to use.  However, based on cervical precautions and not having difficulty with gaze stabilization during the limited ROM testing, feel they are likely not needed.  Once precautions are lifted, if pt reports dizziness this could be examined further in outpatient setting and treated if necessary.       Therapy Documentation Precautions:  Precautions Precautions: Fall, Cervical, Other (comment) Precaution Comments: very painful neck with all movement; has vertebral artery stenosis Required Braces or Orthoses:  (per orders, no brace needed) Restrictions Weight Bearing Restrictions: No Other Position/Activity Restrictions: no brace needed  Pain: Pain Assessment Pain Scale: 0-10 Pain Score: 0-No pain Faces Pain Scale: Hurts a little bit Pain Type: Acute pain Pain Location: Shoulder Pain Orientation: Left Pain Descriptors / Indicators: Tightness Pain Frequency: Intermittent Pain Onset: On-going Patients Stated Pain Goal: 2 Pain Intervention(s): Ambulation/increased activity Multiple Pain Sites: No ADL: See Care Tool Section for some details of mobility  Therapy/Group: Individual Therapy  Anwita Mencer OTR/L  10/02/2020, 12:24 PM

## 2020-10-02 NOTE — Consult Note (Signed)
Neuropsychological Consultation   Patient:   [name] [name].   DOB:   09-Sep-1946  MR Number:  [id number]  Location:  [facility] [place] [id number] [place] [address] Dept: [place]: [phone]           Date of Service:   10/02/2020  Start Time:   8 AM End Time:   9 AM  Provider/Observer:  Ilean Skill, Psy.D.       Clinical Neuropsychologist       Billing Code/Service: [phone]  Chief Complaint:    [name] [name] is a 74 year old male with history of CAD, type 2 diabetes, hypertension, ACDF C3-C7 with cervical myelopathy with progressive left hemibody/hemifacial weakness and tingling, lightheadedness and presyncopal events with head turned to the left and tendency to fall to the left since August 2021.  Stroke has been ruled out by neurology at [facility] and his symptoms were felt to be likely related to C-spine.  However, the patient has had left lingering symptoms that could be related to stroke and/or cervical spine issues.  Patient was admitted after fall on 09/15/2020 with weakness and inability to walk.  He was admitted on 09/15/2020 for work-up and MRI brain negative.  MRI/MRI cervical spine showed ACDF C3-C7 with large osteophyte on the left C5-C6 with severe left foraminal encroachment and spinal stenosis with bilateral cord hyperintensity at C5-6.  CTA head neck showed severe stenosis versus occlusion of the proximal right vestibular artery with distal reconstitution as well as severe focal stenosis at origin of left vestibular artery and proximal 50% stenosis of bilateral CAS.  Patient underwent C3-C7 posterior decompression on 11/7 with significant resolution of left facial numbness and improvement and left upper extremity sensation.  Reason for Service:  Patient was referred for neuropsychological consultation due to emotional lability, adjustment coping issues.  Below is the HPI  for the current admission.  [name] is a 73 year old male with history of CAD, T2DM, HTN, ACDF C3-C7 with cervical myelopathy withprogressive left hemibody/hemifacial weaknessand tingling, light headedness and presyncope with head turn to the left and tendency to fall to the left since August 2021. Stroke ruled out and seen by neurology at [facility] who felt that symptoms likely C spine related. He was admitted on 09/15/20 afterfall with weakness and inability to walk.He was admitted 09/15/20 by Dr. Zada Finders for work up and MRI brain negative. MRI/MRA cervical spine showed ACDF C3-C7 with large osteophyte on left C5-C6 with severe left foraminal encroachment and spinal stenosis wind bilateral cord hyperintensity at C5-C6. CTA head/neck shwoed severe stenosis v/s occlusion at proximal R-VA with distal reconstitution as well as severe focal stenosis at origin of L-VA and approximate 50% stenosis of bilateral CAS.   Neurology consulted for input and felt B-VAS unlikely to cause symptoms. Facial pain due to spinal nerve root impingement and surgical decompression recommended. Patient underwent C3-C7 posterior decompression on 11/07 by Dr. Zada Finders. Post op with significant neck pain but resolution of left facial numbness and improvement in LUE sensation. He did develop gout left foot affecting mobility and started on colchicine yesterday. Therapy ongoing and CIR recommended due to functional deficits.   Current Status:  The patient was alert and oriented but laying back in his bed slightly elevated.  The patient reports that he is doing well and was oriented x4.  However, he does admit that he has had some crying spells and  worried about not being able to continue to work both in his yard or in his occupation as a Programmer, systems.  The patient describes symptoms consistent with vestibular insufficiency syndrome and there were symptoms that were continuing on as far as left arm  weakness and other symptoms that are difficult to differentiate between residual effects of cervical spine issues/bow hunter's syndrome/vestibular insufficiency syndrome.  Patient denies any current significant depression or anxiety symptoms and is looking forward to discharge tomorrow.  Behavioral Observation: [name] [name].  presents as a 74 y.o.-year-old Right Caucasian Male who appeared his stated age. his dress was Appropriate and he was Well Groomed and his manners were Appropriate to the situation.  his participation was indicative of Appropriate and Redirectable behaviors.  There were any physical disabilities noted.  he displayed an appropriate level of cooperation and motivation.     Interactions:    Active Appropriate and Redirectable  Attention:   abnormal and attention span appeared shorter than expected for age  Memory:   within normal limits; recent and remote memory intact  Visuo-spatial:  not examined  Speech (Volume):  normal  Speech:   normal; normal  Thought Process:  Coherent and Relevant  Though Content:  WNL; not suicidal and not homicidal  Orientation:   person, place, time/date and situation  Judgment:   Fair  Planning:   Fair  Affect:    Appropriate  Mood:    Dysphoric  Insight:   Fair  Intelligence:   normal  Medical History:   Past Medical History:  Diagnosis Date  . CAD (coronary artery disease)   . Cancer ([place])   . Diabetes ([place])   . Dyslipidemia   . Gout   . History of kidney stones   . HTN (hypertension)   . Myocardial infarction [facility])        Psychiatric History:  Patient has no prior psychiatric history but has had times of significant emotional lability and difficulty with adjustment coping during hospital course.  Patient is having motivated to return to his previous work and is concerned that the ongoing symptoms will keep him from being able to do such.  Family Med/Psych History:  Family History  Problem Relation Age of  Onset  . Heart attack Father 19  . Colon cancer Brother 54   Impression/DX:  [name] [name] is a 74 year old male with history of CAD, type 2 diabetes, hypertension, ACDF C3-C7 with cervical myelopathy with progressive left hemibody/hemifacial weakness and tingling, lightheadedness and presyncopal events with head turned to the left and tendency to fall to the left since August 2021.  Stroke has been ruled out by neurology at [facility] and his symptoms were felt to be likely related to C-spine.  However, the patient has had left lingering symptoms that could be related to stroke and/or cervical spine issues.  Patient was admitted after fall on 09/15/2020 with weakness and inability to walk.  He was admitted on 09/15/2020 for work-up and MRI brain negative.  MRI/MRI cervical spine showed ACDF C3-C7 with large osteophyte on the left C5-C6 with severe left foraminal encroachment and spinal stenosis with bilateral cord hyperintensity at C5-6.  CTA head neck showed severe stenosis versus occlusion of the proximal right vestibular artery with distal reconstitution as well as severe focal stenosis at origin of left vestibular artery and proximal 50% stenosis of bilateral CAS.  Patient underwent C3-C7 posterior decompression on 11/7 with significant resolution of left facial numbness and improvement and  left upper extremity sensation.  The patient was alert and oriented but laying back in his bed slightly elevated.  The patient reports that he is doing well and was oriented x4.  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  The patient describes symptoms consistent with vestibular insufficiency syndrome and there were symptoms that were continuing on as far as left arm weakness and other symptoms that are difficult to differentiate between residual effects of cervical spine issues/bow  hunter's syndrome/vestibular insufficiency syndrome.  Patient denies any current significant depression or anxiety symptoms and is looking forward to discharge tomorrow.  Patient appears to be coping better now with impending discharge but continuing to be quite concrete in his understanding of what is going on and defensive about possible other etiological factors.  The patient's daughter is very active in his medical care and there appear to be completing motivations and concerns which may complicated further ongoing status and care.       Electronically Signed   _______________________ Ilean Skill, Psy.D.  Clinical Neuropsychologist

## 2020-10-02 NOTE — Progress Notes (Signed)
Occupational Therapy Discharge Summary  Patient Details  Name: Viyaan Champine. MRN: [id number] Date of Birth: October 12, 1946    Patient has met 8 of 8 long term goals due to improved activity tolerance, improved balance, postural control, ability to compensate for deficits, functional use of  RIGHT upper and LEFT upper extremity, improved awareness and improved coordination.  Patient to discharge at overall Supervision level.  Patient's care partner is independent to provide the necessary physical assistance at discharge.  Pt is at an overall Supervision level with ADL transfers to toilet, walk in shower, and to various other surfaces with RW and cues for safety and L lateral lean. Pt is aware of L lean and has improved safety awareness with pacing techniques. Pain levels have singificantly improved from eval and pt is able to participate in all ADLs, performing with supervision with use of AE to reach feet/back for bathing tasks. Family educated via phone call on 11/22 and son educated in person previous week.  Reasons goals not met: NA  Recommendation:  Patient will benefit from ongoing skilled OT services in home health setting to continue to advance functional skills in the area of BADL and Reduce care partner burden.  Equipment: no equipment, family is purchasing a Civil engineer, contracting.   Reasons for discharge: treatment goals met and discharge from hospital  Patient/family agrees with progress made and goals achieved: Yes  OT Discharge Precautions/Restrictions  Precautions Precautions: Fall;Cervical;Other (comment) (L lean at times) Restrictions Weight Bearing Restrictions: No Vital Signs Therapy Vitals Temp: 98.3 F (36.8 C) Temp Source: Oral Pulse Rate: 79 Resp: 18 BP: 127/68 Patient Position (if appropriate): Sitting Oxygen Therapy SpO2: 100 % O2 Device: Room Air Pain Pain Assessment Pain Scale: 0-10 Pain Score: 0-No pain ADL ADL Eating: Set up Grooming: Modified  independent Where Assessed-Grooming: Sitting at sink Upper Body Bathing: Setup Where Assessed-Upper Body Bathing: Shower Lower Body Bathing: Supervision/safety Where Assessed-Lower Body Bathing: Shower Upper Body Dressing: Setup Where Assessed-Upper Body Dressing: Sitting at sink Lower Body Dressing: Supervision/safety Where Assessed-Lower Body Dressing: Sitting at sink, Standing at sink Toileting: Supervision/safety Toilet Transfer: Close supervision Toilet Transfer Method: Ambulating Tub/Shower Transfer: Not assessed Social research officer, government: Close supervision ADL Comments: Functional assessment profoundly limited by pain Vision Baseline Vision/History: Wears glasses Wears Glasses: At all times Patient Visual Report: No change from baseline Perception  Perception: Within Functional Limits Praxis Praxis: Intact Cognition Overall Cognitive Status: Within Functional Limits for tasks assessed Arousal/Alertness: Awake/alert Orientation Level: Oriented X4 Sustained Attention: Appears intact Memory: Appears intact Safety/Judgment: Appears intact Sensation Coordination Gross Motor Movements are Fluid and Coordinated: Yes Fine Motor Movements are Fluid and Coordinated: No Motor  Motor Motor: Abnormal postural alignment and control Motor - Skilled Clinical Observations: Continues to have some stiffness, L lean, and discomfort at times in neck/shoulders but significantly improved from eval Mobility  Transfers Sit to Stand: Supervision/Verbal cueing Stand to Sit: Supervision/Verbal cueing  Trunk/Postural Assessment  Cervical Assessment Cervical Assessment: Exceptions to [facility] (cervical precautions, stiffness present but improved) Thoracic Assessment Thoracic Assessment: Exceptions to [facility] (rounded shoulders) Lumbar Assessment Lumbar Assessment: Exceptions to [facility] Postural Control Postural Control: Within Functional Limits  Balance Balance Balance Assessed: Yes Static Sitting  Balance Static Sitting - Balance Support: Feet supported Static Sitting - Level of Assistance: 6: Modified independent (Device/Increase time) Dynamic Sitting Balance Dynamic Sitting - Balance Support: Feet supported;During functional activity Dynamic Sitting - Level of Assistance: 6: Modified independent (Device/Increase time) Static Standing Balance Static Standing - Balance Support: Bilateral upper  extremity supported Static Standing - Level of Assistance: 5: Stand by assistance Dynamic Standing Balance Dynamic Standing - Balance Support: During functional activity;Left upper extremity supported Dynamic Standing - Level of Assistance: 5: Stand by assistance Dynamic Standing - Balance Activities: Reaching for objects;Forward lean/weight shifting;Lateral lean/weight shifting Extremity/Trunk Assessment RUE Assessment RUE Assessment: Within Functional Limits (RUE WFL however, limited at PLOF by old work related injury but able to perform ADLs within hins ROM) LUE Assessment LUE Assessment:  (limied by old RTC repairs, but however functional for ADL tasks, improved ROM compared to eval and not limited by pain)   Viona Gilmore 10/02/2020, 5:12 PM

## 2020-10-03 LAB — GLUCOSE, CAPILLARY: Glucose-Capillary: 157 mg/dL — ABNORMAL HIGH (ref 70–99)

## 2020-10-03 MED ORDER — TAMSULOSIN HCL 0.4 MG PO CAPS: 0.4000 mg | ORAL_CAPSULE | Freq: Every day | ORAL | 0 refills | Status: DC

## 2020-10-03 MED ORDER — SENNOSIDES-DOCUSATE SODIUM 8.6-50 MG PO TABS: 2.0000 | ORAL_TABLET | Freq: Every day | ORAL | 0 refills | Status: DC

## 2020-10-03 MED ORDER — GABAPENTIN 300 MG PO CAPS: 600.0000 mg | ORAL_CAPSULE | Freq: Two times a day (BID) | ORAL | 1 refills | Status: DC

## 2020-10-03 MED ORDER — ARTIFICIAL TEARS OPHTHALMIC OINT: TOPICAL_OINTMENT | Freq: Two times a day (BID) | OPHTHALMIC | 2 refills | Status: DC

## 2020-10-03 MED ORDER — DOCUSATE SODIUM 100 MG PO CAPS: 100.0000 mg | ORAL_CAPSULE | Freq: Three times a day (TID) | ORAL | 0 refills | Status: DC

## 2020-10-03 MED ORDER — METFORMIN HCL 850 MG PO TABS: 850.0000 mg | ORAL_TABLET | Freq: Two times a day (BID) | ORAL | 0 refills | Status: DC

## 2020-10-03 MED ORDER — DIAZEPAM 5 MG PO TABS: 5.0000 mg | ORAL_TABLET | Freq: Two times a day (BID) | ORAL | 0 refills | Status: DC | PRN

## 2020-10-03 MED ORDER — TAMSULOSIN HCL 0.4 MG PO CAPS: 0.8000 mg | ORAL_CAPSULE | Freq: Every day | ORAL | 0 refills | Status: DC

## 2020-10-03 MED ORDER — ACETAMINOPHEN 325 MG PO TABS
325.0000 mg | ORAL_TABLET | ORAL | Status: DC | PRN
Start: 2020-10-03 — End: 2024-08-18

## 2020-10-03 NOTE — Progress Notes (Signed)
[facility] PHYSICAL MEDICINE & REHABILITATION PROGRESS NOTE   Subjective/Complaints:  Pt says continues to void, but has refused cath at least 1x in last 24 hours.   Says not taking pain meds anymore- has been on Valium 5 mg BID- will con't for 7 days after d/c, but then get off Valium.   Ready for d/c today. Wants a prn f/u with Dr Dagoberto Ligas- will arrange it prn.   ROS:  Pt denies SOB, abd pain, CP, N/V/C/D, and vision changes    Objective:   No results found. Recent Labs    10/02/20 0715  WBC 7.7  HGB 11.1*  HCT 34.6*  PLT 301   Recent Labs    10/02/20 0715  NA 136  K 3.8  CL 100  CO2 26  GLUCOSE 155*  BUN 12  CREATININE 0.87  CALCIUM 8.9    Intake/Output Summary (Last 24 hours) at 10/03/2020 0920 Last data filed at 10/03/2020 0853 Gross per 24 hour  Intake 800 ml  Output 1350 ml  Net -550 ml        Physical Exam: Vital Signs Blood pressure 134/74, pulse 82, temperature 97.7 F (36.5 C), resp. rate 17, height 5\' 9"  (1.753 m), weight 101.1 kg, SpO2 97 %.  General: sitting up in bedside chair, appropriate, NAD HEENT: decreased sensation V1/2/3 on L side-no change; much less tight neck/shoulder muscles- almost loose Mood and affect are appropriate Heart: RRR Lungs: CTA B/L- no W/R/R- good air movement Abdomen: Soft, NT, ND, (+)BS  Extremities: No clubbing, cyanosis, or edema   Musculoskeletal:  Comments: Well healed old left knee surgical scars. Left fore foot and great toe with mild erythema--minimal tenderness to touch.  UEs- at least 4/5 bilateral biceps, triceps, delt, grip, HF, KE, ADF  Skin: incision looks great on posterior neck Neurological:  Mental Status: HOx3  Comments: Mild left facial weakness with dysphonia noted. Left sided weakness with sensory deficits.       Assessment/Plan: 1. Functional deficits which require 3+ hours per day of interdisciplinary therapy in a comprehensive inpatient rehab  setting.  Physiatrist is providing close team supervision and 24 hour management of active medical problems listed below.  Physiatrist and rehab team continue to assess barriers to discharge/monitor patient progress toward functional and medical goals  Care Tool:  Bathing    Body parts bathed by patient: Face, Right arm, Left arm, Chest, Abdomen, Front perineal area, Buttocks, Right upper leg, Left upper leg, Right lower leg, Left lower leg   Body parts bathed by helper: Right arm, Left arm, Chest, Abdomen, Buttocks, Right lower leg, Left lower leg     Bathing assist Assist Level: Supervision/Verbal cueing     Upper Body Dressing/Undressing Upper body dressing   What is the patient wearing?: Pull over shirt    Upper body assist Assist Level: Set up assist    Lower Body Dressing/Undressing Lower body dressing      What is the patient wearing?: Pants     Lower body assist Assist for lower body dressing: Supervision/Verbal cueing     Toileting Toileting Toileting Activity did not occur (Clothing management and hygiene only): N/A (no void or bm)  Toileting assist Assist for toileting: Supervision/Verbal cueing Assistive Device Comment: urinal   Transfers Chair/bed transfer  Transfers assist     Chair/bed transfer assist level: Supervision/Verbal cueing Chair/bed transfer assistive device: Programmer, multimedia   Ambulation assist      Assist level: Supervision/Verbal cueing Assistive device: Walker-rolling Max  distance: 150   Walk 10 feet activity   Assist     Assist level: Supervision/Verbal cueing Assistive device: Walker-rolling   Walk 50 feet activity   Assist Walk 50 feet with 2 turns activity did not occur: Safety/medical concerns  Assist level: Supervision/Verbal cueing Assistive device: Walker-rolling    Walk 150 feet activity   Assist Walk 150 feet activity did not occur: Safety/medical concerns  Assist level:  Supervision/Verbal cueing Assistive device: Walker-rolling    Walk 10 feet on uneven surface  activity   Assist Walk 10 feet on uneven surfaces activity did not occur: Safety/medical concerns   Assist level: Supervision/Verbal cueing Assistive device: Aeronautical engineer Will patient use wheelchair at discharge?: No Type of Wheelchair: Manual    Wheelchair assist level: Supervision/Verbal cueing Max wheelchair distance: 41'    Wheelchair 50 feet with 2 turns activity    Assist        Assist Level: Total Assistance - Patient < 25%   Wheelchair 150 feet activity     Assist  Wheelchair 150 feet activity did not occur: Safety/medical concerns       Blood pressure 134/74, pulse 82, temperature 97.7 F (36.5 C), resp. rate 17, height 5\' 9"  (1.753 m), weight 101.1 kg, SpO2 97 %.  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 -patient may Shower once doesn't need pressure dressing on posterior neck  11/22- healing well-  -ELOS/Goals: 11/23 Supervision to mod I 2. Antithrombotics: -DVT/anticoagulation:Mechanical:Sequential compression devices, below kneeBilateral lower extremities - will see, once bleeding stops from cervical incision, when can start Lovenox 11/16- Surgery was 11/7- has been 9 days- at high risk for DVT_ will start Lovenox. Called [facility]- didn't hear back so far. 11/22- walking well enough- doesn't need to go home on Lovenox -antiplatelet therapy: N/A 3. Pain Management:Continue Neurontin TID with Oxycodone prn  11/12- will add flexeril scheduled for pain/spasms- if need be, will do trigger point injections this weekend.  11/13- will try valium 5 mg BID - if not helpful, do TrP injections Had TPI 11/14  11/16- Trigger point injections again today- still very tight/painful  11/18- pain mujch better  since injections- now "tolerable".   11/19- now having pain in L side of face- will increase Gabapentin to 600 mg BID- see if that helps.   11/22- pt says doing much better- could be Gabapentin?  11/23- d/c with 7 days of Valium only- then can switch back to regular muscle relaxant if needed 4. Mood:LCSW to follow for evaluation and support. -antipsychotic agents: N/A 5. Neuropsych: This patientiscapable of making decisions on hisown behalf. 6. Skin/Wound Care:Routine pressure relief measures. Monitor wound for healing. 7. Fluids/Electrolytes/Nutrition:Monitor I/O. Appetite has been good. Check BMET in am.  8. HTN: Monitor BP tid--continue Toprol XL daily  11/12- BP well controlled- con't regimen 9. Gout flare: Continue colchicine bid--symptoms improving.Don't think will need Steroids  11/12- said much improved- no steroids required  11/14- resolved  11/23- change colchicine to daily 10. T2DM: Will monitor BS ac/hs. BS poorly controlled--resume metformin. Continue to hold glucotrol. Will use SSI for elevated BS  11/12- jist restarted Metformin- BGs 160s-226- will give 1-2 days and see if better- then restart glucotrol.   11/13- a little better- max is 200- will add glucotrol tomorrow if not better   CBG (last 3)  Recent Labs    10/02/20 1651 10/02/20 2050 10/03/20 0622  GLUCAP 141* 147*  157*    Good in hospital control 11/21  11/18- BGs 147 to 197- will increase metformin to 850 mg BID  11/22- family upset that we've given him insulin and feel this is why pt is emotional- they feel BGS 150-200 are good control and doesn't and WILL NOT go home on anything but home regimen.   11/23- will send home on regimen daughter wants pt to have- pt said he defers to daughter 92. Hyponatremia: Recheck labs in am.  12. Constipation: Will add Senna S.Might need sorbitol- no BM since last Friday.  11/21 last BM 11/20, cont type 4, large 13. Neurogenic bladder vs outlet  obstruction with  urinary retention-   11/21 voiding without foley BVI 300-375ml  11/22- Pt refused caths 2-3x in last 24 hours- but continues to void some- will arrange f/u with Urology.  14. L eye pain/blurriness  11/18- have called and L/M for Ophthalmology- waiting to hear back- will see what the plan is from them- Dr Alanda Slim. .   11/19- could be nerve pain- Dr Alanda Slim found nothing wrong with eyes.  15. Vertigo  11/19- will add Vertigo evaluation by PT- does appear to have good ability right himself with pushing him to each side/front/back- while sitting. But could be inner ear? Will check with PT  11/22- fell this AM- of note.     LOS: 12 days A FACE TO FACE EVALUATION WAS PERFORMED  Teeghan Hammer 10/03/2020, 9:20 AM

## 2020-10-03 NOTE — Discharge Summary (Signed)
Physician Discharge Summary  Patient ID: [name] [name]. MRN: [id number] DOB/AGE: [birth year] 74 y.o.  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[facility])   Neurogenic bladder   Tobacco use disorder   Gout   Constipation   Discharged Condition: stable   Significant Diagnostic Studies: N/a   Labs:  Basic Metabolic Panel: BMP Latest Ref Rng & Units 10/02/2020 09/25/2020 09/21/2020  Glucose 70 - 99 mg/dL 155(H) 172(H) 205(H)  BUN 8 - 23 mg/dL 12 14 17   Creatinine 0.61 - 1.24 mg/dL 0.87 0.82 0.83  Sodium 135 - 145 mmol/L 136 134(L) 134(L)  Potassium 3.5 - 5.1 mmol/L 3.8 3.5 3.9  Chloride 98 - 111 mmol/L 100 95(L) 100  CO2 22 - 32 mmol/L 26 25 24   Calcium 8.9 - 10.3 mg/dL 8.9 8.9 8.6(L)    CBC: CBC Latest Ref Rng & Units 10/02/2020 09/25/2020 09/22/2020  WBC 4.0 - 10.5 K/uL 7.7 8.5 8.7  Hemoglobin 13.0 - 17.0 g/dL 11.1(L) 12.6(L) 10.5(L)  Hematocrit 39 - 52 % 34.6(L) 38.6(L) 32.0(L)  Platelets 150 - 400 K/uL 301 306 232    CBG: Recent Labs  Lab 10/02/20 0601 10/02/20 1122 10/02/20 1651 10/02/20 2050 10/03/20 0622  GLUCAP 166* 188* 141* 147* 157*    Brief HPI:   [name] [name]. is a 74 y.o. male with history of CAD, T2DM, HTN, ACDF C3-C7 cervical myelopathy with progressive left hemibody/hemifacial weakness and tingling with lightheadedness and presyncope with head turns to the left and tendency to fall to the left in August 2021.  He was evaluated on outpatient basis and stroke ruled out.  He was admitted on 09/15/2020 by Dr. 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  Facial pain felt to be due to spinal nerve root impingement and cervical decompression was recommended for management  of symptoms.  Patient underwent C3-C7 posterior decompression on 11/07 by Dr. Venetia Constable.  Postop has had improvement in left facial numbness as well as LUE sensation but was limited by significant neck pain.  He did develop gout of left foot affecting mobility and was started on colchicine.  Therapy ongoing and CIR was recommended due to functional decline.   Hospital Course: [name] [name]. was admitted to rehab 09/21/2020 for inpatient therapies to consist of PT and OT at least three hours five days a week. Past admission physiatrist, therapy team and rehab RN have worked together to provide customized collaborative inpatient rehab. His blood pressures were monitored on TID basis and has been controlled. His diabetes has been monitored with ac/hs CBG checks and SSI was use prn for tighter BS control.  His gout flare has resolved and colchicine was decreased to once a day.  Insulin was discontinued due to patient/family refusal and concerns of SE. Metformin was resumed as blood sugars were poorly controlled and this was titrated up to 850 mg bid. Glucotrol remains on hold at this time.  His bowel program was augmented to help manage constipation.  He has continued to dip tobacco during his stay despite education on hospital restriction as well as its effect on wound healing.   He continues to have problems with voiding requiring in and out catheterizations.  This was painful therefore Foley was placed to help decompress bladder and voiding  program was repeated on 11/21.  Bladder scans done showing PVRs of 300- 350 cc however patient refused I/O catheterization to help decompress bladder.  He has been scheduled to follow-up with urology for further work-up after discharge.  He did report onset of severe eye pain with blurriness on 11/18.  Dr. Alanda Slim ophthalmology was consulted for input and felt that it could be due to nerve pain as eye exam was negative.  Symptoms have resolved with use of eyedrops and he  was advised to follow-up with primary ophthalmology as needed if symptoms recurred.  Pain control has improved with trigger point injection as well as addition of Valium twice daily and was discharged with 7-day course of taper.  His neck incision is C/D/I and is healing well without any signs of infection.  He has made good gains during his stay and is currently at supervision level.  He will continue to receive follow-up home health PT and OT by [facility] at home after discharge.   Rehab course: During patient's stay in rehab weekly team conferences were held to monitor patient's progress, set goals and discuss barriers to discharge. At admission, patient required max assist with basic ADL task and mod assist with mobility. He  has had improvement in activity tolerance, balance, postural control as well as ability to compensate for deficits.  He is able to complete ADL tasks with supervision.  He requires supervision with cues for transfers and to ambulate 150 feet with rolling walker.  Family education was completed regarding all aspects of safety, mobility and care.   Discharge disposition: 01-Home or Self Care  Diet: Carb modified medium.  Special Instructions: 1.  Neck collar at all times.  No driving, strenuous activity or lifting items over 5 pounds till cleared by MD. 2.  Continue monitoring blood sugars AC/HS and follow-up with PCP for further adjustment.   Allergies as of 10/03/2020      Reactions   Simvastatin Other (See Comments)   dizziness      Medication List    STOP taking these medications   aspirin 325 MG tablet   glipiZIDE 5 MG tablet Commonly known as: GLUCOTROL   Oxycodone HCl 10 MG Tabs   vitamin E 180 MG (400 UNITS) capsule     TAKE these medications   acetaminophen 325 MG tablet Commonly known as: TYLENOL Take 1-2 tablets (325-650 mg total) by mouth every 4 (four) hours as needed for mild pain.   artificial tears Oint ophthalmic ointment Commonly known  as: LACRILUBE Place into both eyes 2 (two) times daily.   atorvastatin 20 MG tablet Commonly known as: LIPITOR Take 1 tablet by mouth daily.   b complex vitamins tablet Take 1 tablet by mouth daily.   colchicine 0.6 MG tablet Take 0.6 mg by mouth daily as needed (gout flare).   diazepam 5 MG tablet--RX # 14 pills Commonly known as: VALIUM Take 1 tablet (5 mg total) by mouth every 12 (twelve) hours as needed for muscle spasms. Notes to patient: You have been getting this twice a day for neck/shoulder pain.  You can taper as follows--take 1/2 tablet twice a day for 4 days then 1/2 tablet daily till gone.    docusate sodium 100 MG capsule Commonly known as: COLACE Take 1 capsule (100 mg total) by mouth 3 (three) times daily.   Fish Oil 1000 MG Caps Take 1,000 mg by mouth 2 (two) times daily.   gabapentin 300 MG capsule Commonly known as: NEURONTIN Take  2 capsules (600 mg total) by mouth 2 (two) times daily. What changed:   how much to take  when to take this   LIFESCAN FINEPOINT LANCETS Misc Use to check blood sugar 3 time(s) daily   metFORMIN 850 MG tablet Commonly known as: GLUCOPHAGE Take 1 tablet (850 mg total) by mouth 2 (two) times daily with a meal. What changed:   medication strength  how much to take  when to take this   metoprolol succinate 50 MG 24 hr tablet Commonly known as: TOPROL-XL Take 1 tablet (50 mg total) by mouth daily. Take with or immediately following a meal. What changed: additional instructions   omeprazole 20 MG capsule Commonly known as: PRILOSEC Take 20 mg by mouth daily.   senna-docusate 8.6-50 MG tablet Commonly known as: Senokot-S Take 2 tablets by mouth daily.   tamsulosin 0.4 MG Caps capsule Commonly known as: FLOMAX Take 2 capsules (0.8 mg total) by mouth at bedtime. What changed: how much to take       Follow-up Information    Lovorn, Jinny Blossom, MD. Call.   Specialty: Physical Medicine and Rehabilitation Why: as  needed Contact information: [address] [place] [address] [phone]        Judith Part, MD. Call.   Specialty: Neurosurgery Why: for post op appointment Contact information: [place] [address] [phone]        Corrington, Delsa Grana, MD. Call.   Specialty: Family Medicine Why: for post  hospital follow up Contact information: [place] [address] [phone]        Bjorn Loser, MD Follow up on 11/08/2020.   Specialty: Urology Why: Appointment at 9 am for evaluation of bladder issues Contact information: [place] [address] [phone]        Awanda Mink, MD. Call.   Specialty: Ophthalmology Why: If you eye continues to bother you.  Contact information: [place] [address] [phone]               Signed: Bary Leriche 10/04/2020, 12:18 PM

## 2020-10-03 NOTE — Discharge Instructions (Signed)
Inpatient Rehab Discharge Instructions  [name] [name]. Discharge date and time: 10/03/20   Activities/Precautions/ Functional Status: Activity: no lifting, driving, or strenuous exercise for cleared by MD Diet: diabetic diet Wound Care: keep wound clean and dry Contact Dr. Zada Finders if you develop any problems with your incision/wound--redness, swelling, increase in pain, drainage or if you develop fever or chills.    Functional status:  ___ No restrictions     ___ Walk up steps independently _X__ 24/7 supervision/assistance   ___ Walk up steps with assistance ___ Intermittent supervision/assistance  ___ Bathe/dress independently ___ Walk with walker     ___ Bathe/dress with assistance ___ Walk Independently    ___ Shower independently ___ Walk with assistance    _X__ Shower with assistance _X__ No alcohol/tobacco    ___ Return to work/school ________   Special Instructions: 1. Tobacco is going to delay healing of your neck.  2. Check blood sugars before meals and at bedtime.   COMMUNITY REFERRALS UPON DISCHARGE:    Home Health:   PT     OT                     Agency: [facility]) Phone: [phone]    Medical Equipment/Items Ordered: Shower Chair                                                 Agency/Supplier: Daughter to privatelty purchase    My questions have been answered and I understand these instructions. I will adhere to these goals and the provided educational materials after my discharge from the hospital.  Patient/Caregiver Signature _______________________________ Date __________  Clinician Signature _______________________________________ Date __________  Please bring this form and your medication list with you to all your follow-up doctor's appointments.

## 2020-10-03 NOTE — Progress Notes (Signed)
Inpatient Rehabilitation Care Coordinator  Discharge Note  The overall goal for the admission was met for:   Discharge location: Yes, home  Length of Stay: Yes, 12 Days  Discharge activity level: Yes, ambulatory level Supervision  Home/community participation: Yes  Services provided included: MD, RD, PT, OT, SLP, RN, CM, TR, Pharmacy, [place]: Private Insurance: [employer]  Follow-up services arranged: Home Health: [facility])  Comments (or additional information):  Patient/Family verbalized understanding of follow-up arrangements: Yes  Individual responsible for coordination of the follow-up plan: Al Corpus (daughter) [phone]  Confirmed correct DME delivered: Dyanne Iha 10/03/2020    Dyanne Iha

## 2020-10-04 DIAGNOSIS — M109 Gout, unspecified: Secondary | ICD-10-CM

## 2020-10-04 DIAGNOSIS — N319 Neuromuscular dysfunction of bladder, unspecified: Secondary | ICD-10-CM

## 2020-10-04 DIAGNOSIS — K5900 Constipation, unspecified: Secondary | ICD-10-CM

## 2020-10-04 DIAGNOSIS — E1140 Type 2 diabetes mellitus with diabetic neuropathy, unspecified: Secondary | ICD-10-CM

## 2020-10-04 DIAGNOSIS — F17200 Nicotine dependence, unspecified, uncomplicated: Secondary | ICD-10-CM

## 2020-10-16 ENCOUNTER — Other Ambulatory Visit: Payer: Self-pay

## 2020-10-16 DIAGNOSIS — I6509 Occlusion and stenosis of unspecified vertebral artery: Secondary | ICD-10-CM

## 2020-10-24 ENCOUNTER — Other Ambulatory Visit: Payer: Self-pay | Admitting: Physical Medicine and Rehabilitation

## 2020-11-08 ENCOUNTER — Other Ambulatory Visit: Payer: Self-pay

## 2020-11-08 ENCOUNTER — Ambulatory Visit (HOSPITAL_COMMUNITY)
Admission: RE | Admit: 2020-11-08 | Discharge: 2020-11-08 | Disposition: A | Discharge status: Home / Self Care | Payer: Medicare HMO | Source: Ambulatory Visit | Attending: Vascular Surgery | Admitting: Vascular Surgery

## 2020-11-08 DIAGNOSIS — I6509 Occlusion and stenosis of unspecified vertebral artery: Secondary | ICD-10-CM | POA: Diagnosis present

## 2020-11-13 ENCOUNTER — Ambulatory Visit (INDEPENDENT_AMBULATORY_CARE_PROVIDER_SITE_OTHER): Payer: Medicare HMO | Admitting: Vascular Surgery

## 2020-11-13 ENCOUNTER — Other Ambulatory Visit: Payer: Self-pay

## 2020-11-13 ENCOUNTER — Encounter: Payer: Self-pay | Admitting: Vascular Surgery

## 2020-11-13 VITALS — BP 155/80 | HR 86 | Temp 98.5°F | Resp 16 | Ht 69.0 in | Wt 222.0 lb

## 2020-11-13 DIAGNOSIS — I6509 Occlusion and stenosis of unspecified vertebral artery: Secondary | ICD-10-CM

## 2020-11-13 NOTE — Progress Notes (Signed)
[facility] [place]  Patient name: [name] [name]. 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 is a 75 y.o. male, here today for evaluation.  He has a very complex history.  He had presented with critical symptomatic cervical disc disease and November 2021.  He underwent urgent C3-C7 posterior cervical decompression.  As part of his work-up he underwent CT angiogram of his head and neck on 09/15/2020.  This revealed moderate internal carotid disease at the origin bilaterally.  He had an occluded right vertebral artery at its origin with a very small artery throughout its course.  Left vertebral was of normal caliber but had stenosis at its origin from the subclavian artery.  Also underwent duplex of his carotid and vertebral arteries on 11/08/2020.  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  He had normal flow characteristics in his left vertebral and minimal predicted stenosis in his carotid arteries bilaterally.  He did well from his cervical decompression and is continuing to recover.  He reports several persistent symptoms.  He reports that he is numb on the left half of his face.  Also reports decreased sensation in his right arm and right hand.  He reports that he has had no further dizziness or fainting spells following his surgery.  He has no history of stroke or anterior circulation symptoms.  He was seen by neurology during his hospitalization and consultation on 09/16/2020 commented on his vertebral artery findings and this was felt not to be contributing to his symptoms.  Past Medical History:  Diagnosis Date  . CAD (coronary artery disease)   . Cancer ([place])   . Diabetes ([place])   . Dyslipidemia   . Gout   . History of kidney stones   . HTN (hypertension)   . Myocardial infarction [facility])     Family History  Problem Relation Age of Onset  . Heart  attack Father 51  . Colon cancer Brother 4    SOCIAL HISTORY: Social History   Socioeconomic History  . Marital status: Married    Spouse name: Not on file  . Number of children: Not on file  . Years of education: Not on file  . Highest education level: Not on file  Occupational History  . Not on file  Tobacco Use  . Smoking status: Former Smoker    Packs/day: 5.00    Years: 36.00    Pack years: 180.00    Types: Cigarettes    Start date: 11/24/1952    Quit date: 11/24/1988    Years since quitting: 31.9  . Smokeless tobacco: Current User    Types: Snuff  Vaping Use  . Vaping Use: Never used  Substance and Sexual Activity  . Alcohol use: No    Alcohol/week: 0.0 standard drinks  . Drug use: No  . Sexual activity: Not on file  Other Topics Concern  . Not on file  Social History Narrative  . Not on file   Social Determinants of Health   Financial Resource Strain: Not on file  Food Insecurity: Not on file  Transportation Needs: Not on file  Physical Activity: Not on file  Stress: Not on file  Social Connections: Not on file  Intimate Partner Violence: Not on file    Allergies  Allergen Reactions  . Simvastatin Other (See Comments)    dizziness    Current Outpatient  Medications  Medication Sig Dispense Refill  . acetaminophen (TYLENOL) 325 MG tablet Take 1-2 tablets (325-650 mg total) by mouth every 4 (four) hours as needed for mild pain.    Marland Kitchen artificial tears (LACRILUBE) OINT ophthalmic ointment Place into both eyes 2 (two) times daily. 4 g 2  . atorvastatin (LIPITOR) 20 MG tablet Take 1 tablet by mouth daily.    Marland Kitchen b complex vitamins tablet Take 1 tablet by mouth daily.    . colchicine 0.6 MG tablet Take 0.6 mg by mouth daily as needed (gout flare).    . diazepam (VALIUM) 5 MG tablet Take 1 tablet (5 mg total) by mouth every 12 (twelve) hours as needed for muscle spasms. 14 tablet 0  . docusate sodium (COLACE) 100 MG capsule Take 1 capsule (100 mg total) by  mouth 3 (three) times daily. 90 capsule 0  . gabapentin (NEURONTIN) 300 MG capsule Take 2 capsules (600 mg total) by mouth 2 (two) times daily. 120 capsule 1  . LIFESCAN FINEPOINT LANCETS MISC Use to check blood sugar 3 time(s) daily    . metFORMIN (GLUCOPHAGE) 850 MG tablet Take 1 tablet (850 mg total) by mouth 2 (two) times daily with a meal. 60 tablet 0  . metoprolol succinate (TOPROL-XL) 50 MG 24 hr tablet Take 1 tablet (50 mg total) by mouth daily. Take with or immediately following a meal. (Patient taking differently: Take 50 mg by mouth daily.) 90 tablet 1  . Omega-3 Fatty Acids (FISH OIL) 1000 MG CAPS Take 1,000 mg by mouth 2 (two) times daily.     Marland Kitchen omeprazole (PRILOSEC) 20 MG capsule Take 20 mg by mouth daily.   2  . senna-docusate (SENOKOT-S) 8.6-50 MG tablet Take 2 tablets by mouth daily. 60 tablet 0  . tamsulosin (FLOMAX) 0.4 MG CAPS capsule Take 2 capsules (0.8 mg total) by mouth at bedtime. 60 capsule 0   No current facility-administered medications for this visit.    REVIEW OF SYSTEMS:  [X]  denotes positive finding, [ ]  denotes negative finding Cardiac  Comments:  Chest pain or chest pressure:    Shortness of breath upon exertion:    Short of breath when lying flat:    Irregular heart rhythm:        Vascular    Pain in calf, thigh, or hip brought on by ambulation:    Pain in feet at night that wakes you up from your sleep:     Blood clot in your veins:    Leg swelling:         Pulmonary    Oxygen at home:    Productive cough:     Wheezing:         Neurologic    Sudden weakness in arms or legs:     Sudden numbness in arms or legs:  x   Sudden onset of difficulty speaking or slurred speech:    Temporary loss of vision in one eye:     Problems with dizziness:  x       Gastrointestinal    Blood in stool:     Vomited blood:         Genitourinary    Burning when urinating:     Blood in urine:        Psychiatric    Major depression:         Hematologic     Bleeding problems:    Problems with blood clotting too easily:  Skin    Rashes or ulcers:        Constitutional    Fever or chills:      PHYSICAL EXAM: Vitals:   11/13/20 0859  BP: (!) 155/80  Pulse: 86  Resp: 16  Temp: 98.5 F (36.9 C)  TempSrc: Other (Comment)  SpO2: 99%  Weight: 222 lb (100.7 kg)  Height: 5\' 9"  (1.753 m)    GENERAL: The patient is a well-nourished male, in no acute distress. The vital signs are documented above. VASCULAR: 2+ radial pulses bilaterally.  Carotid arteries without bruits bilaterally PULMONARY: There is good air exchange MUSCULOSKELETAL: There are no major deformities or cyanosis. NEUROLOGIC: No focal weakness or paresthesias are detected. SKIN: There are no ulcers or rashes noted. PSYCHIATRIC: The patient has a normal affect.  DATA:   CT angiogram reviewed and discussed with the patient from 09/15/2020.  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  MEDICAL ISSUES:  Discussed these findings at length with the patient.  I do not feel that he is having any symptoms of posterior circulation ischemia.  He does have minimal disease in his anterior circulation.  He will continue his rehab and follow-up with Dr. 13/03/2020.  I would not recommend cerebral arteriogram or further work-up unless he had evidence of posterior ischemia.  He will see Maurice Small again on an as-needed basis   [place], MD [facility] of [place] Office phone [phone]

## 2020-11-20 IMAGING — RF DG CERVICAL SPINE 2 OR 3 VIEWS
1 series · 3 of 3 positions shown · IV contrast (agent unspecified)
Comparison: None.

CLINICAL DATA: Left cervical C3-6 anterior decompression,
diskectomy and fusion.

EXAM:
DG C-ARM 1-60 MIN; CERVICAL SPINE - 2-3 VIEW
CONTRAST:  None.
FLUOROSCOPY TIME:  Fluoroscopy Time:  0 minutes 18 seconds.
Radiation Exposure Index (if provided by the fluoroscopic device):
Not known.
Number of Acquired Spot Images: 3

[Series 1: run · 3 of 3 slices shown]
[im 1/3]
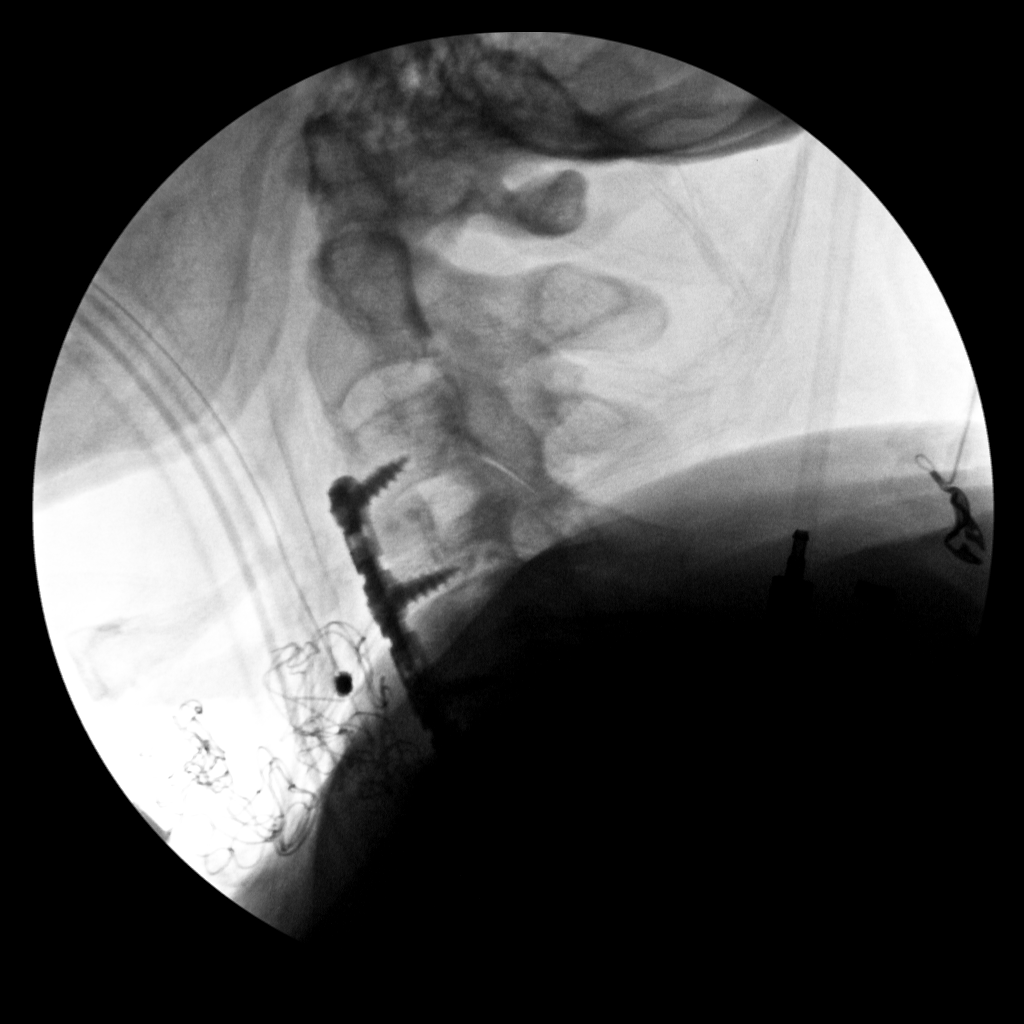
[im 2/3]
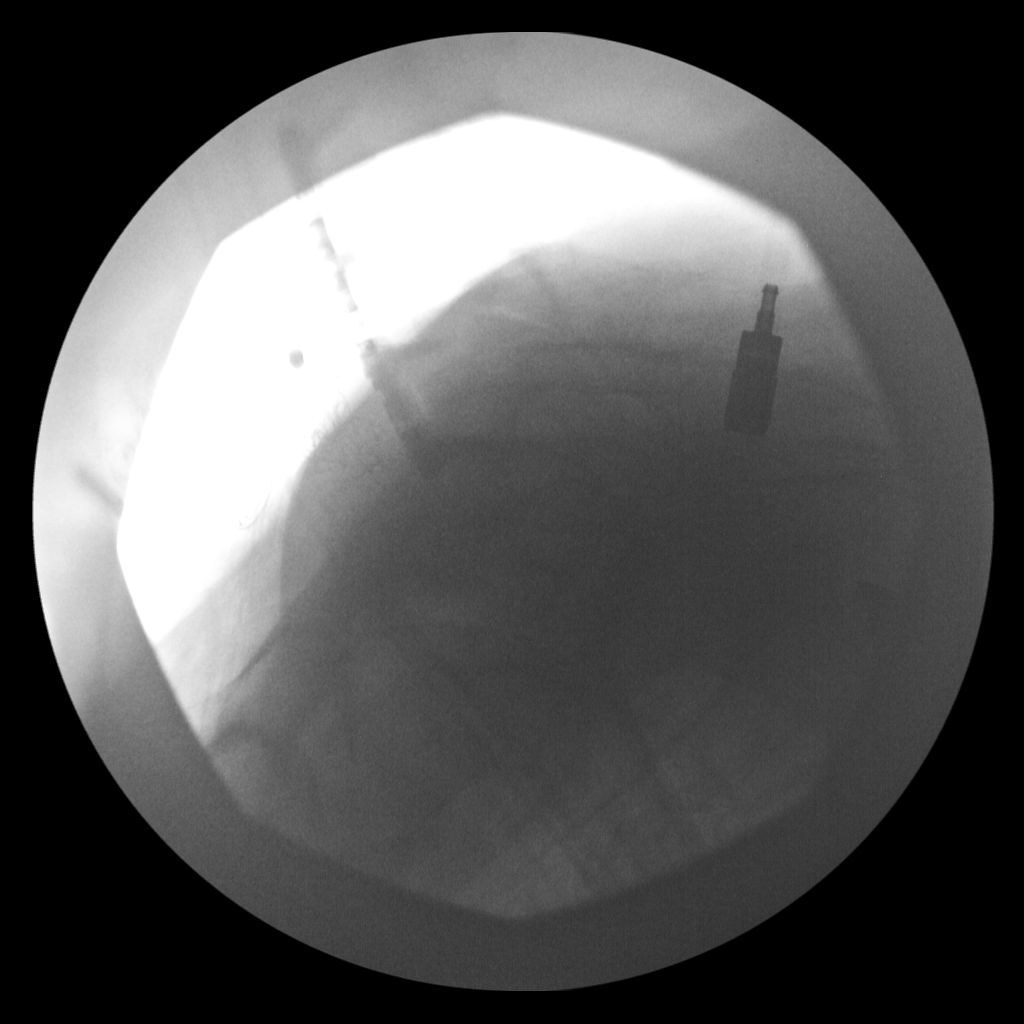
[im 3/3]
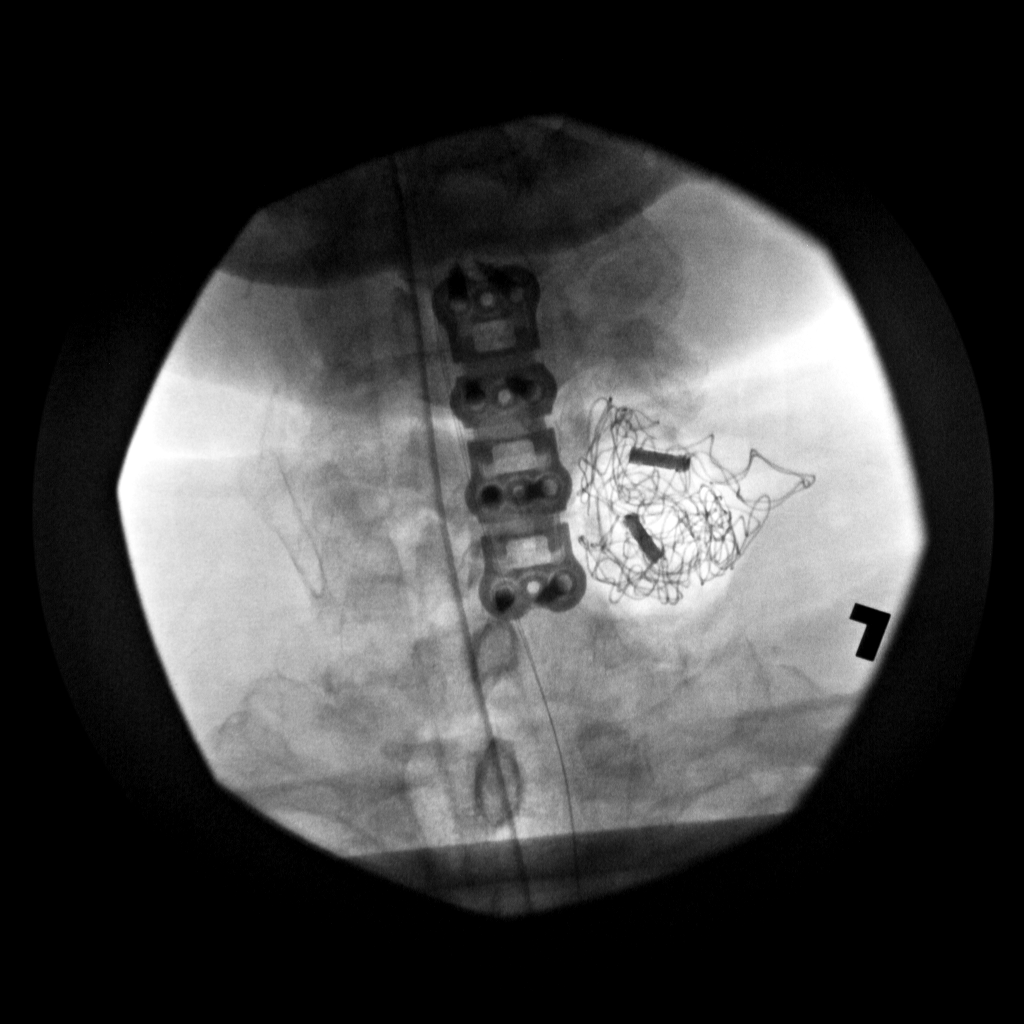

[3 of 3 positions shown; findings below may reference images not displayed]

FINDINGS: Examination demonstrates normal vertebral body alignment and
heights. There is anterior fusion hardware from C3-C6 which appears
intact and normally located. Interbody fusion at the intervening
disc spaces. C4-5 and C5-6 disc spaces are not well visualized.
Recommend correlation with findings at the time of the procedure.
IMPRESSION: Anterior fusion hardware intact from C3-C6.  Interbody fusion C3-C6.

## 2020-11-20 IMAGING — RF DG C-ARM 1-60 MIN
1 series · 3 of 3 positions shown · IV contrast (agent unspecified)
Comparison: None.

CLINICAL DATA: Left cervical C3-6 anterior decompression,
diskectomy and fusion.

EXAM:
DG C-ARM 1-60 MIN; CERVICAL SPINE - 2-3 VIEW
CONTRAST:  None.
FLUOROSCOPY TIME:  Fluoroscopy Time:  0 minutes 18 seconds.
Radiation Exposure Index (if provided by the fluoroscopic device):
Not known.
Number of Acquired Spot Images: 3

[Series 1: run · 3 of 3 slices shown]
[im 1/3]
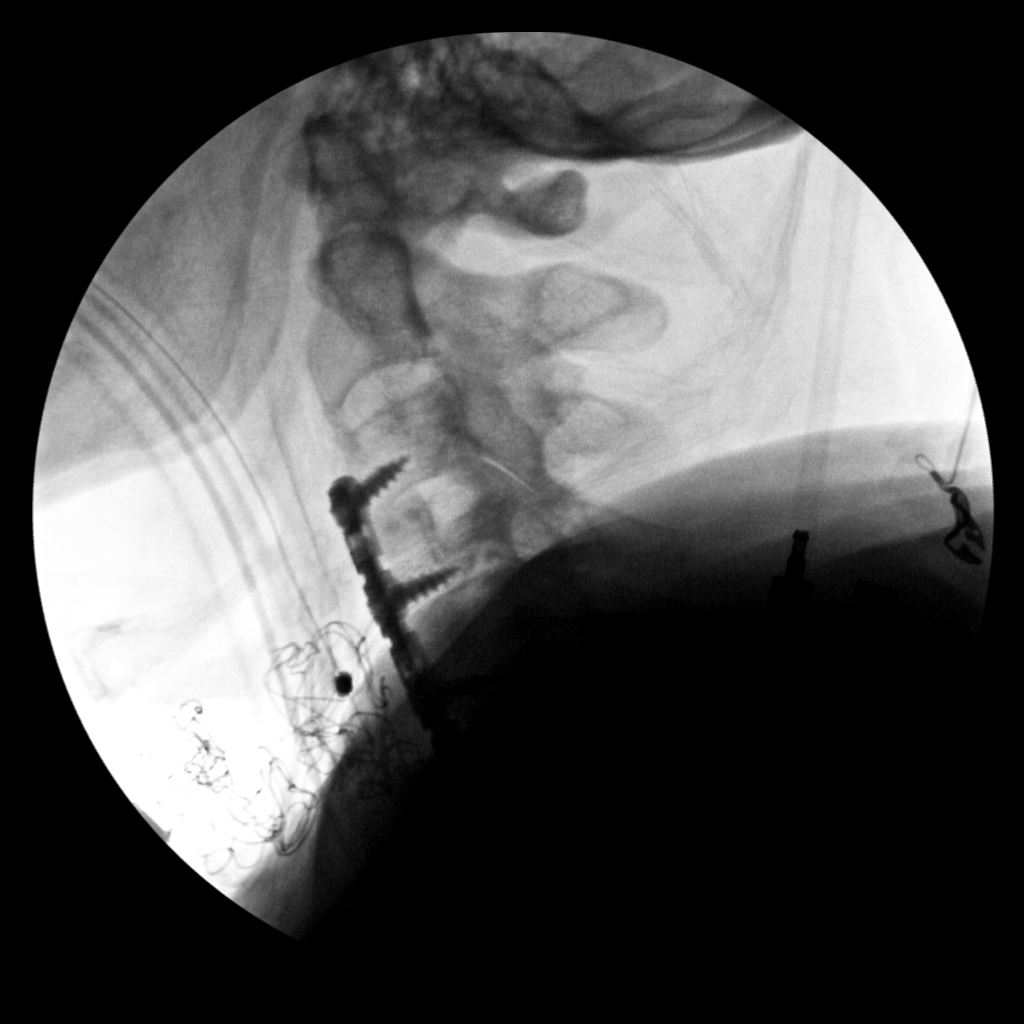
[im 2/3]
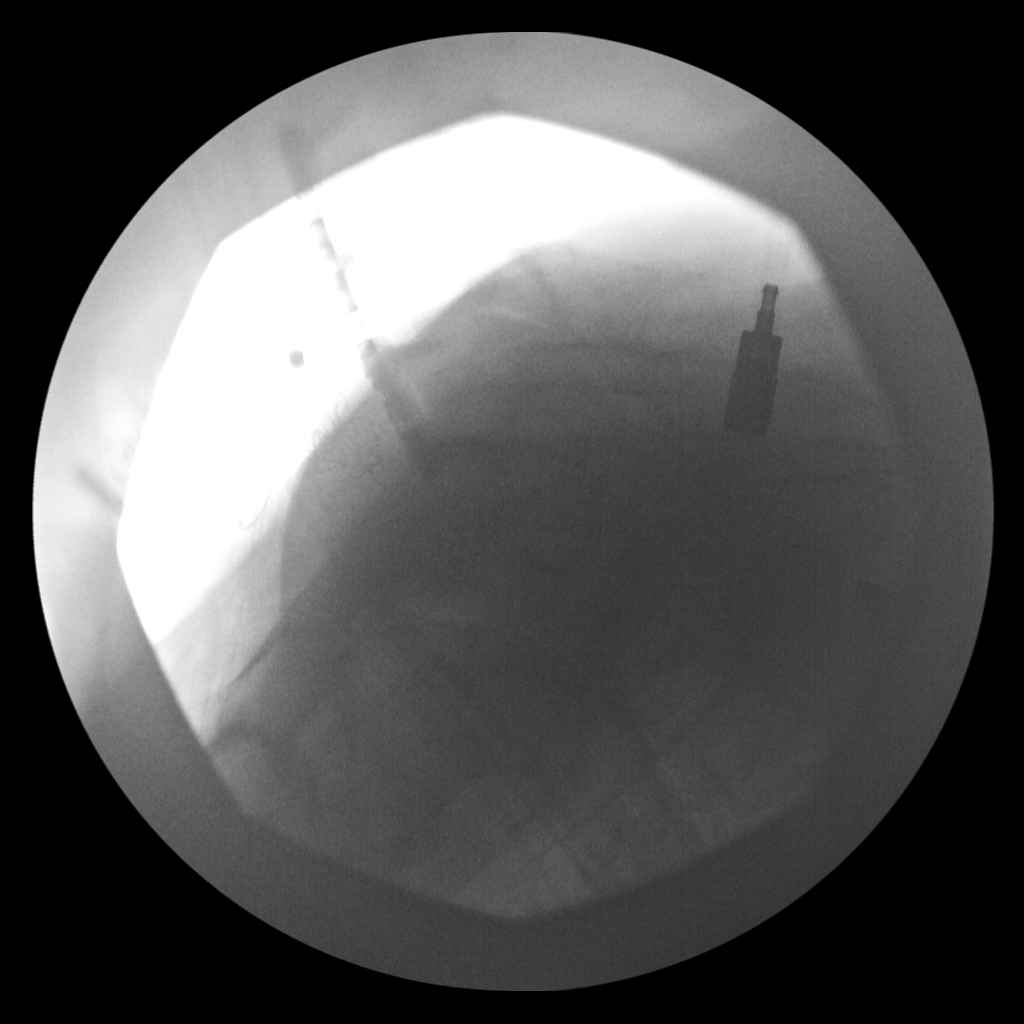
[im 3/3]
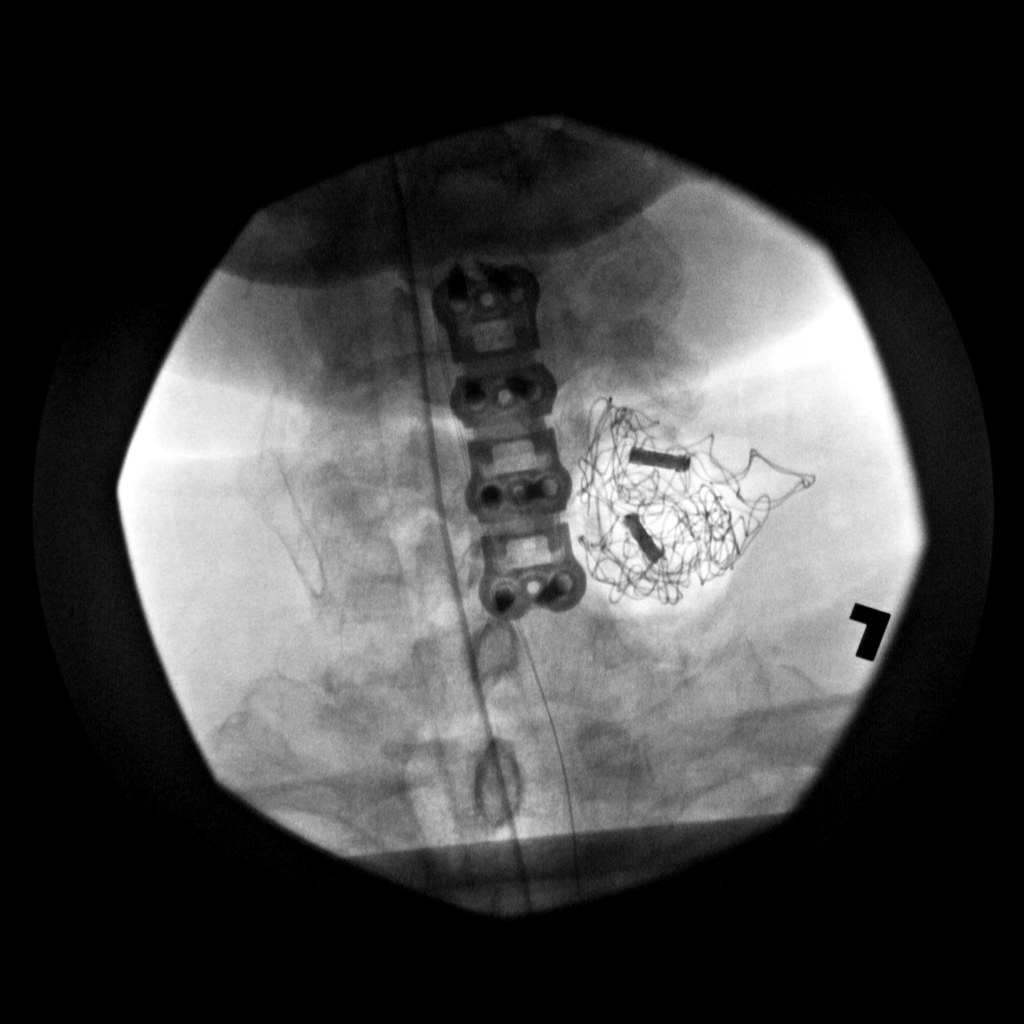

[3 of 3 positions shown; findings below may reference images not displayed]

FINDINGS: Examination demonstrates normal vertebral body alignment and
heights. There is anterior fusion hardware from C3-C6 which appears
intact and normally located. Interbody fusion at the intervening
disc spaces. C4-5 and C5-6 disc spaces are not well visualized.
Recommend correlation with findings at the time of the procedure.
IMPRESSION: Anterior fusion hardware intact from C3-C6.  Interbody fusion C3-C6.

## 2020-12-25 NOTE — Progress Notes (Signed)
Cardiology Office Note   Date:  12/27/2020   ID:  [name] Blyden., DOB 1946-05-06, MRN [id number]  PCP:  Curly Rim, MD  Cardiologist:  Minus Breeding, MD EP: None  Chief Complaint  Patient presents with  . Coronary Artery Disease      History of Present Illness: [name] Scherzer. is a 75 y.o. male with a PMH of CAD s/p angioplasty in early 1990s, HTN, HLD, mild carotid artery disease, DM type 2,who previously saw Dr. Bronson Ing.  His last ischemic evaluation was a NST 11/2018 which was a low risk study with a medium defect of moderate severity in the mid-inferoseptal and mid-inferior location which was felt to be 2/2 soft tissue attenuation; EF 54%. 12/2019.   He was admitted to [facility] from 07/04/20-07/06/20 after presenting with acute left arm weakness. CT Head and MRI brain were negative for acute findings. Bilateral carotid dopplers showed mild bilateral carotid artery disease. Echo showed EF 55-60%, G1DD, mild to moderate LVH, mild LAE, negative bubble study, and no significant valvular abnormalities. 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 He was started on low dose atorvastatin in addition to home fenofibrate for elevated triglycerides. He was subsequently seen in the ED 07/15/20 for similar complaints of left arm weakness with possible facial numbness. Code stroke activated and he underwent a CT head and MRI brain which were again negative.   This is his first visit with me.  Since the last time he was seen he had C spine surgery and a rehab stay.  I reviewed these records for this visit.   He was just discharged from rehab. Cardiac complications during the surgery or rehabs.. He is now home and walking with a cane. He denies any recent cardiovascular symptoms. The patient denies any new symptoms such as chest discomfort, neck or arm discomfort. There has  been no new shortness of breath, PND or orthopnea. There have been no reported palpitations, presyncope or syncope.   Past Medical History:  Diagnosis Date  . CAD (coronary artery disease)   . Cancer ([place])   . Diabetes ([place])   . Dyslipidemia   . Gout   . History of kidney stones   . HTN (hypertension)   . Myocardial infarction [facility])     Past Surgical History:  Procedure Laterality Date  . ANTERIOR CERVICAL DECOMP/DISCECTOMY FUSION Left 10/20/2019   Procedure: Left Cervical Three-Four Cervical Four-Five Cervical Five-Six Anterior cervical decompression/discectomy/fusion;  Surgeon: Judith Part, MD;  Location: [place];  Service: Neurosurgery;  Laterality: Left;  Left Cervical Three-Four Cervical Four-Five Cervical Five-Six Anterior cervical decompression/discectomy/fusion  . BACK SURGERY    . left knee surgery    . left shoulder surgery    . POSTERIOR CERVICAL FUSION/FORAMINOTOMY N/A 09/17/2020   Procedure: CERVICAL THREE TO CERVICAL SIX POSTERIOR CERVICAL DECOMPRESSION;  Surgeon: Judith Part, MD;  Location: [place];  Service: Neurosurgery;  Laterality: N/A;  . right knee surgery       Current Outpatient Medications  Medication Sig Dispense Refill  . acetaminophen (TYLENOL) 325 MG tablet Take 1-2 tablets (325-650 mg total) by mouth every 4 (four) hours as needed for mild pain.    Marland Kitchen aspirin EC 81 MG tablet Take 1 tablet (81 mg total) by mouth daily. Swallow whole. 90 tablet 3  . atorvastatin (LIPITOR) 20 MG tablet Take 1 tablet by mouth daily.    Marland Kitchen  b complex vitamins tablet Take 1 tablet by mouth daily.    . colchicine 0.6 MG tablet Take 0.6 mg by mouth daily as needed (gout flare).    . gabapentin (NEURONTIN) 300 MG capsule Take 2 capsules (600 mg total) by mouth 2 (two) times daily. 120 capsule 1  . metFORMIN (GLUCOPHAGE) 850 MG tablet Take 1 tablet (850 mg total) by mouth 2 (two) times daily with a meal. 60 tablet 0  . metoprolol succinate (TOPROL-XL) 50 MG 24 hr tablet  Take 1 tablet (50 mg total) by mouth daily. Take with or immediately following a meal. (Patient taking differently: Take 50 mg by mouth daily.) 90 tablet 1  . Omega-3 Fatty Acids (FISH OIL) 1000 MG CAPS Take 1,000 mg by mouth 2 (two) times daily.     Marland Kitchen omeprazole (PRILOSEC) 20 MG capsule Take 20 mg by mouth daily.   2  . LIFESCAN FINEPOINT LANCETS MISC Use to check blood sugar 3 time(s) daily     No current facility-administered medications for this visit.    Allergies:   Simvastatin    Social History:  The patient  reports that he quit smoking about 32 years ago. His smoking use included cigarettes. He started smoking about 68 years ago. He has a 180.00 pack-year smoking history. His smokeless tobacco use includes snuff. He reports that he does not drink alcohol and does not use drugs.   Family History:  The patient's family history includes Colon cancer (age of onset: 41) in his brother; Heart attack (age of onset: 2) in his father.    ROS:  Please see the history of present illness.   Otherwise, review of systems are positive for none.   All other systems are reviewed and negative.    PHYSICAL EXAM: VS:  BP 140/70   Pulse 88   Ht 5\' 10"  (1.778 m)   Wt 217 lb (98.4 kg)   BMI 31.14 kg/m  , BMI Body mass index is 31.14 kg/m. GENERAL:  Well appearing NECK:  No jugular venous distention, waveform within normal limits, carotid upstroke brisk and symmetric, no bruits, no thyromegaly LUNGS:  Clear to auscultation bilaterally CHEST:  Unremarkable HEART:  PMI not displaced or sustained,S1 and S2 within normal limits, no S3, no S4, no clicks, no rubs, no murmurs ABD:  Flat, positive bowel sounds normal in frequency in pitch, no bruits, no rebound, no guarding, no midline pulsatile mass, no hepatomegaly, no splenomegaly EXT:  2 plus pulses throughout, no edema, no cyanosis no clubbing    EKG:  EKG is not ordered today.   Recent Labs: 07/15/2020: ALT 17 10/02/2020: BUN 12; Creatinine,  Ser 0.87; Hemoglobin 11.1; Platelets 301; Potassium 3.8; Sodium 136    Lipid Panel    Component Value Date/Time   CHOL 126 09/16/2020 1214   TRIG 281 (H) 09/16/2020 1214   HDL 32 (L) 09/16/2020 1214   CHOLHDL 3.9 09/16/2020 1214   VLDL 56 (H) 09/16/2020 1214   LDLCALC 38 09/16/2020 1214      Wt Readings from Last 3 Encounters:  12/27/20 217 lb (98.4 kg)  11/13/20 222 lb (100.7 kg)  10/01/20 222 lb 14.2 oz (101.1 kg)      Other studies Reviewed: Additional studies/ records that were reviewed today include:  Hospital records  Echocardiogram 07/05/20: Summary  1. The left ventricle is normal in size with mildly to moderately increased  wall thickness.  2. The left ventricular systolic function is normal, LVEF is visually  estimated  at 55-60%.  3. There is grade I diastolic dysfunction (impaired relaxation).  4. The left atrium is mildly dilated in size.  5. The right ventricle is normal in size, with normal systolic function.  6. Negative bubble study.   Carotid duplex 07/06/20: IMPRESSION:  1. Mild atherosclerotic disease involvingbilateral carotid  arteries. Estimated degree of stenosis in the internal carotid  arteries is less than 50% bilaterally.  2. Antegrade flow in the right vertebral artery. Left vertebral  artery is not visualized but this vessel was patent on the recent  CTA examination.     ASSESSMENT AND PLAN:  CAD s/p remote angioplasty:   He has no anginal symptoms. We will continue with risk reduction. He should start back on his baby aspirin.  HTN:    The blood pressures mildly elevated today but this is unusual. No change in therapy. Of note he has had some orthostatic blood pressure problems before.  Bradycardia: I suspect this is really related to under counting with his PVCs. He does not presyncope or syncope. He does not feel the PVCs. No change in therapy.  HLD:   His lipid profile is actually remarkable restarted on Lipitor, long  ago.Marland Kitchen His triglycerides are elevated which should be better addressed diabetes control.  Carotid artery disease: mild on doppler during recent admission 06/2020.  No further imaging. He saw Dr. Donnetta Hutching recently and he had vertebral artery occlusion. However, no other testing is indicated.  DM type 2:   A1c was 6.9. No change in therapy.  Left sided weakness:   He is now status post cervical neck surgery.  Current medicines are reviewed at length with the patient today.  The patient does not have concerns regarding medicines.  The following changes have been made:  As above  Labs/ tests ordered today include:   No orders of the defined types were placed in this encounter.    Disposition:   FU with me in 1 year  Signed, Minus Breeding, MD  12/27/2020 10:04 AM

## 2020-12-26 DIAGNOSIS — R001 Bradycardia, unspecified: Secondary | ICD-10-CM | POA: Insufficient documentation

## 2020-12-27 ENCOUNTER — Encounter: Payer: Self-pay | Admitting: Cardiology

## 2020-12-27 ENCOUNTER — Other Ambulatory Visit: Payer: Self-pay

## 2020-12-27 ENCOUNTER — Ambulatory Visit (INDEPENDENT_AMBULATORY_CARE_PROVIDER_SITE_OTHER): Payer: Medicare HMO | Admitting: Cardiology

## 2020-12-27 VITALS — BP 140/70 | HR 88 | Ht 70.0 in | Wt 217.0 lb

## 2020-12-27 DIAGNOSIS — E785 Hyperlipidemia, unspecified: Secondary | ICD-10-CM | POA: Diagnosis not present

## 2020-12-27 DIAGNOSIS — I2510 Atherosclerotic heart disease of native coronary artery without angina pectoris: Secondary | ICD-10-CM

## 2020-12-27 DIAGNOSIS — R001 Bradycardia, unspecified: Secondary | ICD-10-CM | POA: Diagnosis not present

## 2020-12-27 DIAGNOSIS — I10 Essential (primary) hypertension: Secondary | ICD-10-CM | POA: Diagnosis not present

## 2020-12-27 DIAGNOSIS — E119 Type 2 diabetes mellitus without complications: Secondary | ICD-10-CM

## 2020-12-27 MED ORDER — ASPIRIN EC 81 MG PO TBEC: 81.0000 mg | DELAYED_RELEASE_TABLET | Freq: Every day | ORAL | 3 refills | Status: DC

## 2020-12-27 NOTE — Patient Instructions (Signed)
Medication Instructions:  Please take Aspirin 81 mg a day. Continue all other medications as listed.  *If you need a refill on your cardiac medications before your next appointment, please call your pharmacy*  Follow-Up: At [facility], you and your health needs are our priority.  As part of our continuing mission to provide you with exceptional heart care, we have created designated Provider Care Teams.  These Care Teams include your primary Cardiologist (physician) and Advanced Practice Providers (APPs -  Physician Assistants and Nurse Practitioners) who all work together to provide you with the care you need, when you need it.  We recommend signing up for the patient portal called "MyChart".  Sign up information is provided on this After Visit Summary.  MyChart is used to connect with patients for Virtual Visits (Telemedicine).  Patients are able to view lab/test results, encounter notes, upcoming appointments, etc.  Non-urgent messages can be sent to your provider as well.   To learn more about what you can do with MyChart, go to [email or web address].    Your next appointment:   12 month(s)  The format for your next appointment:   In Person  Provider:   Minus Breeding, MD   Thank you for choosing [facility]!!

## 2021-01-18 ENCOUNTER — Other Ambulatory Visit: Payer: Self-pay

## 2021-01-18 DIAGNOSIS — E785 Hyperlipidemia, unspecified: Secondary | ICD-10-CM

## 2021-01-22 ENCOUNTER — Other Ambulatory Visit: Payer: Self-pay | Admitting: Physical Medicine and Rehabilitation

## 2021-01-30 ENCOUNTER — Other Ambulatory Visit: Payer: Self-pay | Admitting: Physical Medicine and Rehabilitation

## 2021-03-18 ENCOUNTER — Other Ambulatory Visit: Payer: Self-pay | Admitting: Physical Medicine and Rehabilitation

## 2021-04-03 ENCOUNTER — Other Ambulatory Visit: Payer: Self-pay | Admitting: Medical

## 2021-04-16 ENCOUNTER — Other Ambulatory Visit: Payer: Self-pay | Admitting: Physical Medicine and Rehabilitation

## 2021-04-25 ENCOUNTER — Ambulatory Visit: Payer: Medicare HMO | Admitting: Neurology

## 2021-08-16 IMAGING — CT CT HEAD CODE STROKE
3 series · 14 of 47 positions shown, 16 images · non-contrast
Comparison: Head CT and MRI 11/17/2016
COMPARISON: Head CT and MRI 11/17/2016

Addendum:
CLINICAL DATA: Code stroke. Left-sided weakness, slurred speech,
and headache.

EXAM:
CT HEAD WITHOUT CONTRAST
TECHNIQUE: Contiguous axial images were obtained from the base of the skull
through the vertex without intravenous contrast.

[Series 3: head 5.0 st · axial · 0.47mm/px · z∈[-79,+56]mm · 8 of 33 slices shown, 10 images]
[im 3/33  brain]
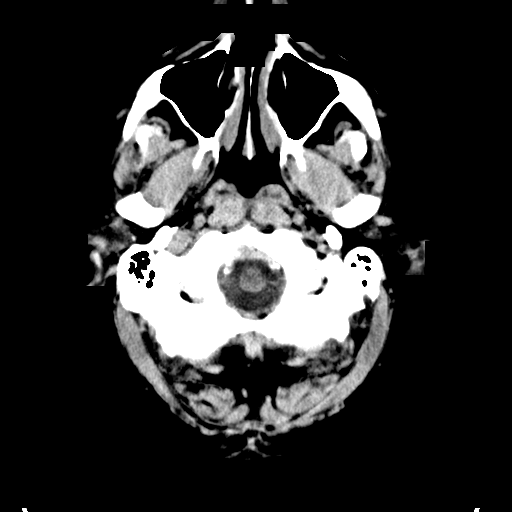
[im 3/33  bone]
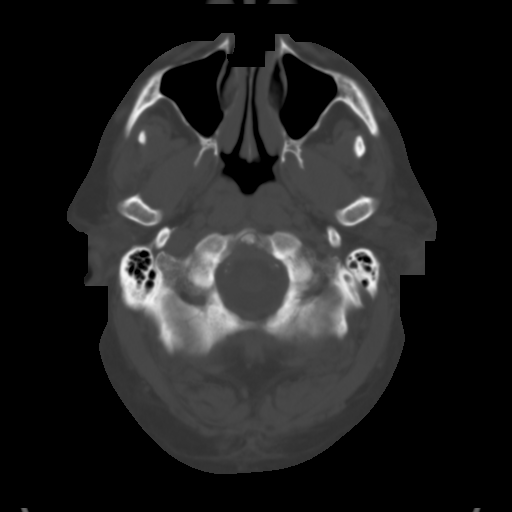
[im 7/33  brain]
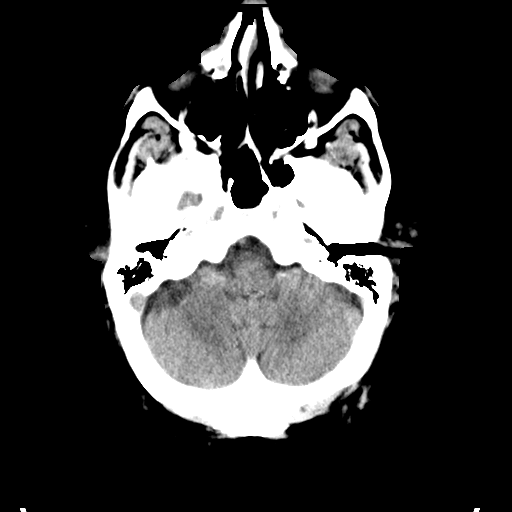
[im 10/33  brain]
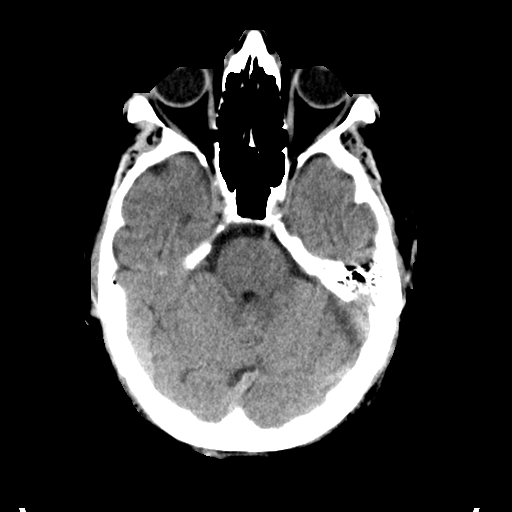
[im 15/33  brain]
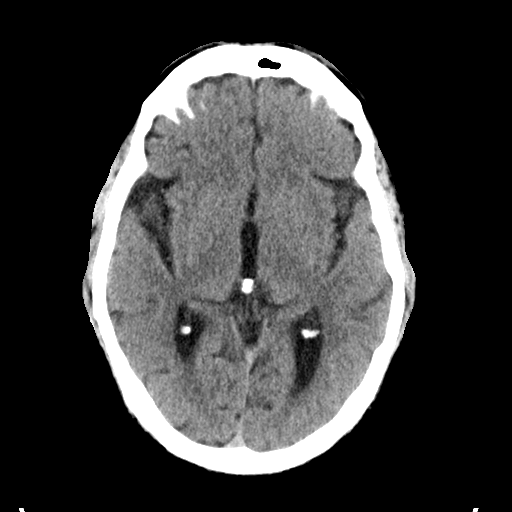
[im 18/33  brain]
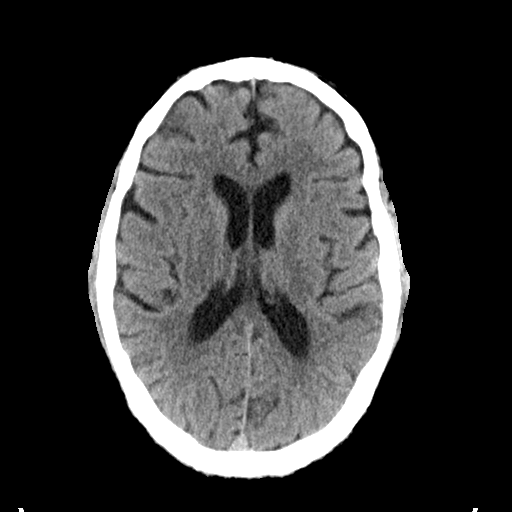
[im 18/33  bone]
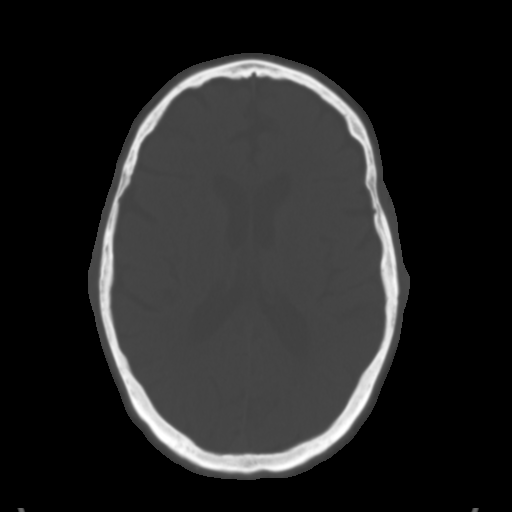
[im 23/33  brain]
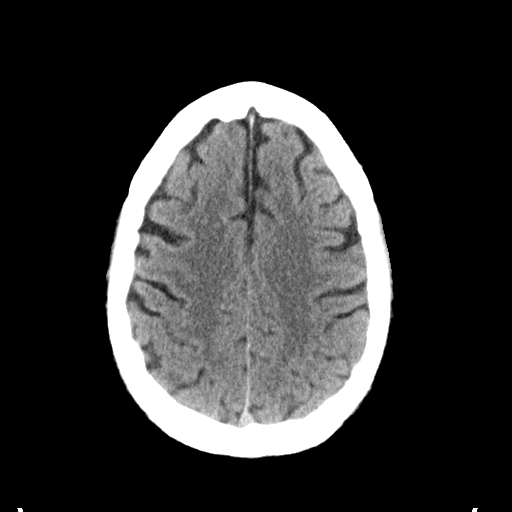
[im 26/33  brain]
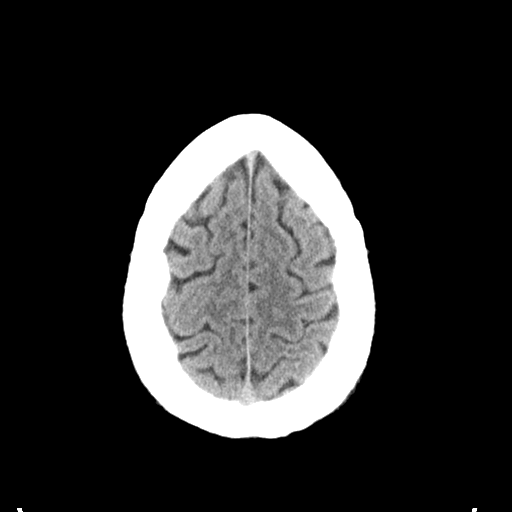
[im 30/33  brain]
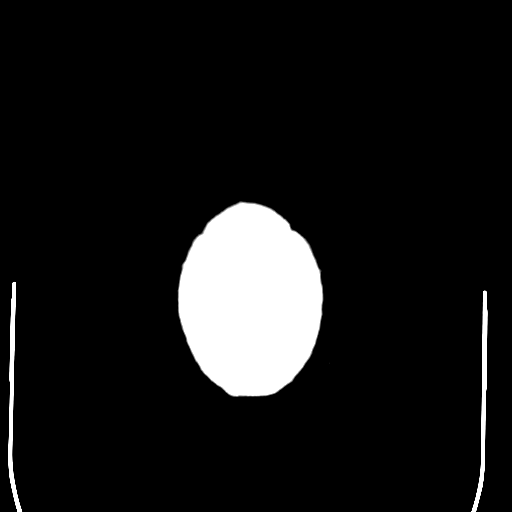

[Series 5: head 3.0 cor st · coronal · 0.35mm/px · 3 of 74 slices shown]
[im 25/74  brain]
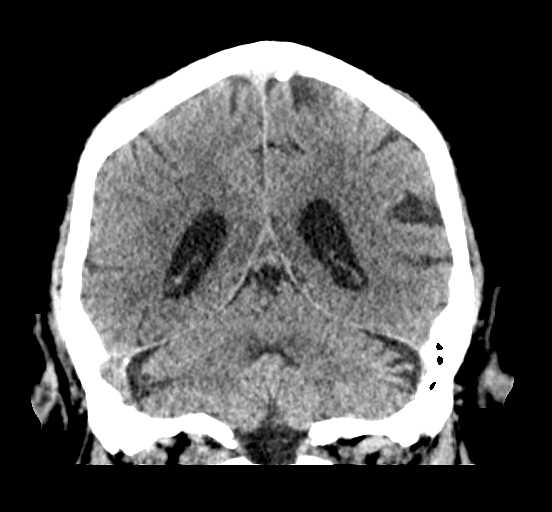
[im 33/74  brain]
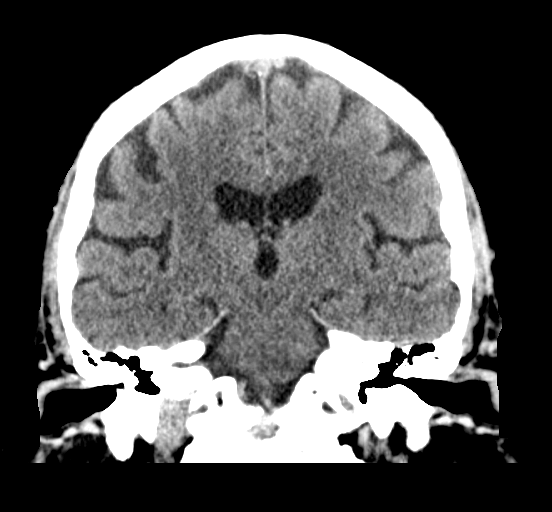
[im 41/74  brain]
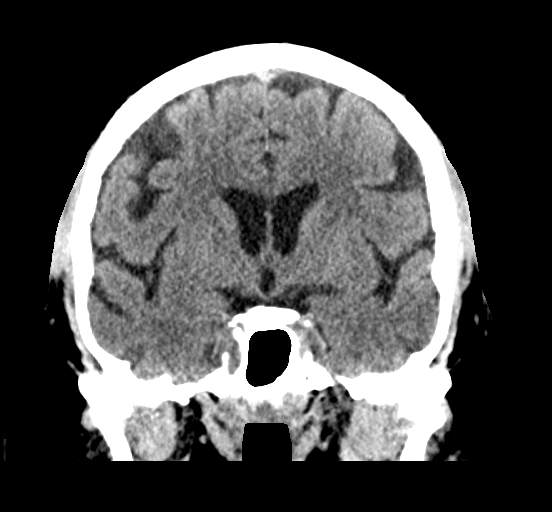

[Series 6: head 3.0 sag st · sagittal · 0.36mm/px · 3 of 57 slices shown]
[im 19/57  brain]
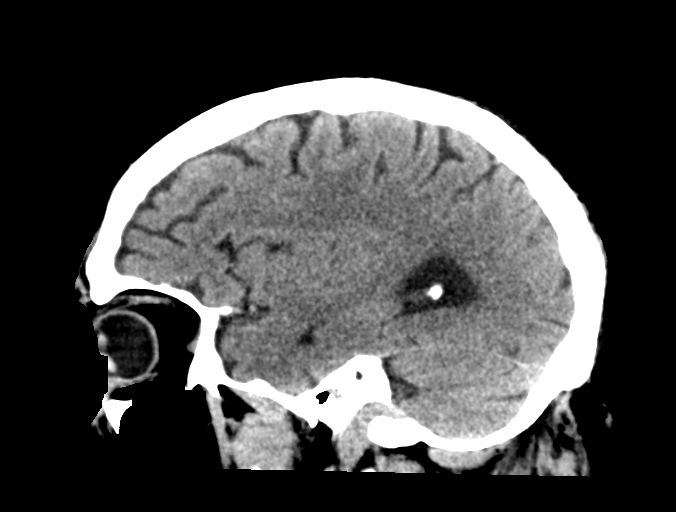
[im 29/57  brain]
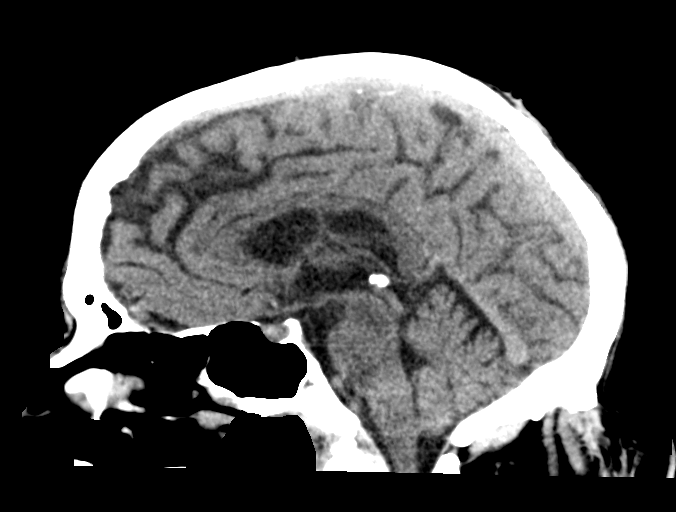
[im 38/57  brain]
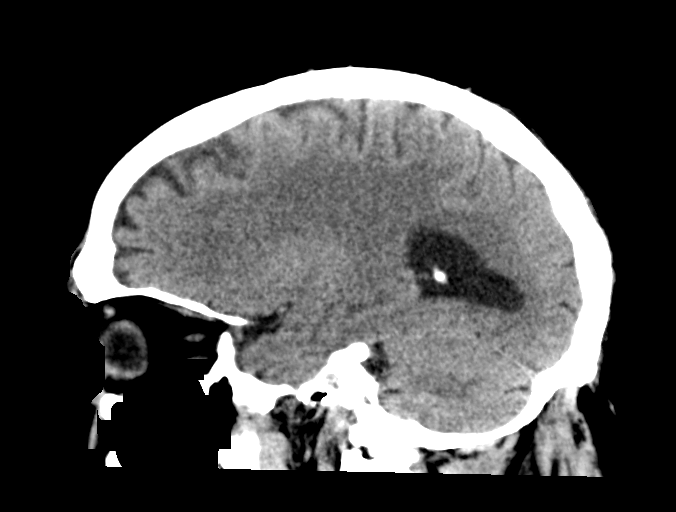

[14 of 47 positions shown; findings below may reference images not displayed]

FINDINGS: Brain: There is no evidence of an acute infarct, intracranial
hemorrhage, mass, midline shift, or extra-axial fluid collection.
The ventricles and sulci are within normal limits for age.

Vascular: Calcified atherosclerosis at the skull base. No hyperdense
vessel.

Skull: No fracture or suspicious osseous lesion.

Sinuses/Orbits: Visualized paranasal sinuses and mastoid air cells
are clear. Unremarkable orbits.

Other: None.

ASPECTS (Alberta Stroke Program Early CT Score)

- Ganglionic level infarction (caudate, lentiform nuclei, internal
capsule, insula, M1-M3 cortex): 7

- Supraganglionic infarction (M4-M6 cortex): 3

Total score (0-10 with 10 being normal): 10
IMPRESSION: 1. Unremarkable CT appearance of the brain.
2. ASPECTS is 10.

ADDENDUM:
These results were communicated to Dr. Ramnefjell at [DATE] on
07/15/2020 by text page via the [employer] messaging system.

*** End of Addendum ***
FINDINGS: Brain: There is no evidence of an acute infarct, intracranial
hemorrhage, mass, midline shift, or extra-axial fluid collection.
The ventricles and sulci are within normal limits for age.

Vascular: Calcified atherosclerosis at the skull base. No hyperdense
vessel.

Skull: No fracture or suspicious osseous lesion.

Sinuses/Orbits: Visualized paranasal sinuses and mastoid air cells
are clear. Unremarkable orbits.

Other: None.

ASPECTS (Alberta Stroke Program Early CT Score)

- Ganglionic level infarction (caudate, lentiform nuclei, internal
capsule, insula, M1-M3 cortex): 7

- Supraganglionic infarction (M4-M6 cortex): 3

Total score (0-10 with 10 being normal): 10
IMPRESSION: 1. Unremarkable CT appearance of the brain.
2. ASPECTS is 10.

## 2021-08-16 IMAGING — MR MR HEAD W/O CM
12 of 13 series · 44 of 48 positions shown · non-contrast
Comparison: Head CT 07/15/2020 and MRI 07/05/2020

CLINICAL DATA: Facial droop, left-sided weakness, slurred speech,
and headache.

EXAM:
MRI HEAD WITHOUT CONTRAST
TECHNIQUE: Multiplanar, multiecho pulse sequences of the brain and surrounding
structures were obtained without intravenous contrast.

[Series 5: DWI · axial · 3.0mm · 0.88mm/px · z∈[-60,+78]mm · 8 of 96 slices shown (1 of 4)]
[im 1/96]
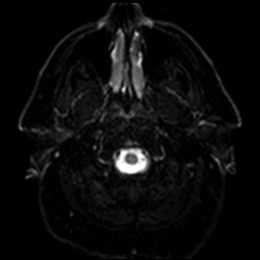
[im 14/96]
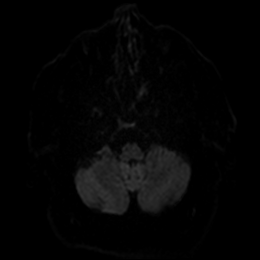
[im 28/96]
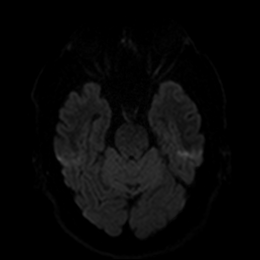
[im 41/96]
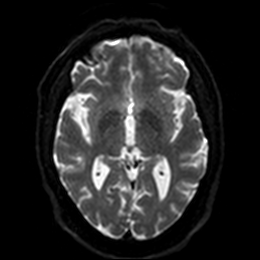
[im 55/96]
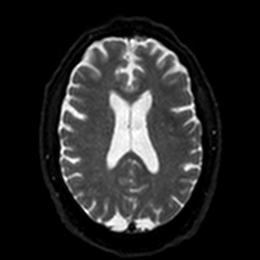
[im 68/96]
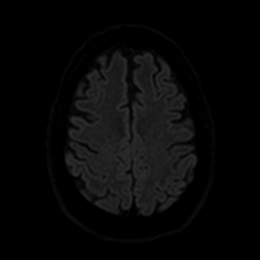
[im 82/96]
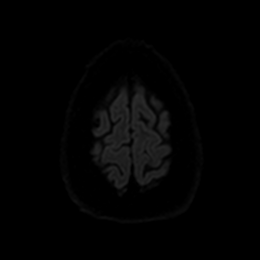
[im 96/96]
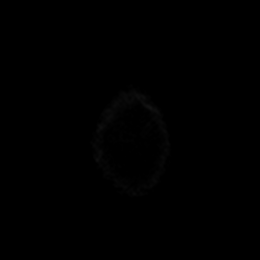

[Series 6: DWI · axial · 3.0mm · 0.88mm/px · z∈[-60,+78]mm · 3 of 48 slices shown (2 of 4)]
[im 1/48]
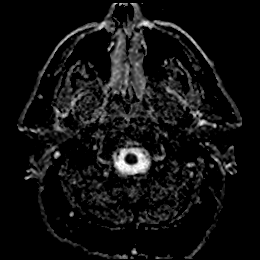
[im 24/48]
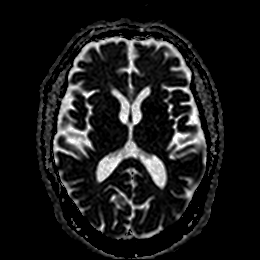
[im 48/48]
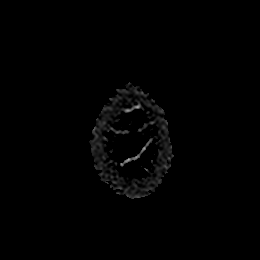

[Series 7: DWI · coronal · 4.0mm · 0.88mm/px · 6 of 80 slices shown (3 of 4)]
[im 1/80]
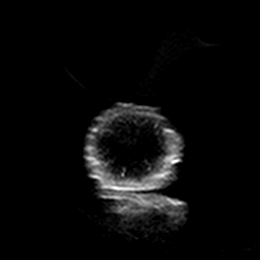
[im 16/80]
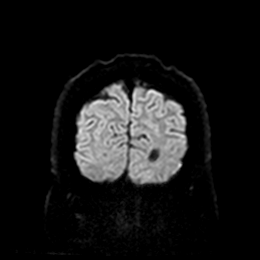
[im 32/80]
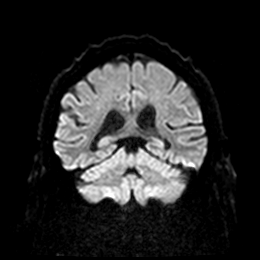
[im 48/80]
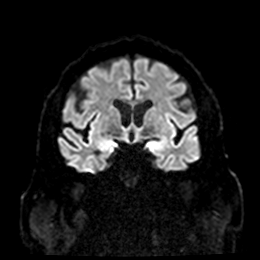
[im 64/80]
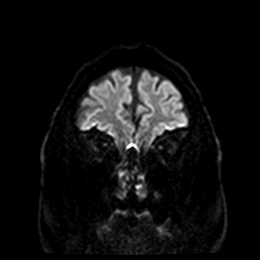
[im 80/80]
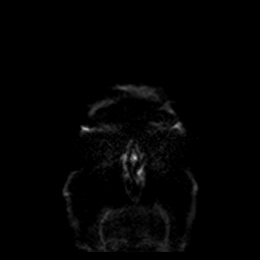

[Series 8: DWI · coronal · 4.0mm · 0.88mm/px · 3 of 40 slices shown (4 of 4)]
[im 1/40]
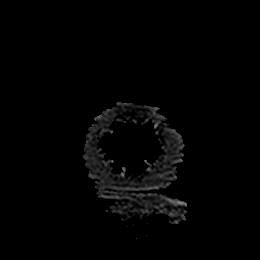
[im 20/40]
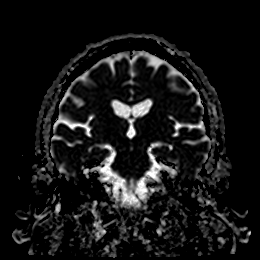
[im 40/40]
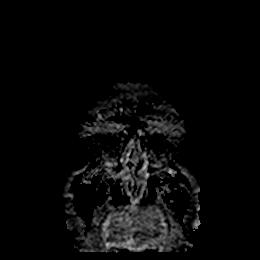

[Series 9: T1 · sagittal · 5.0mm · 0.75mm/px · 2 of 25 slices shown]
[im 1/25]
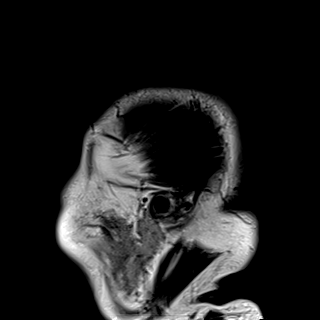
[im 25/25]
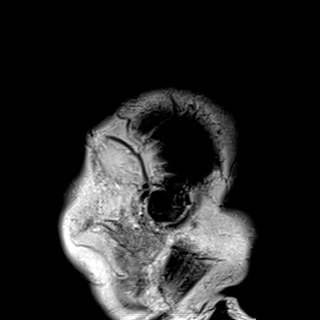

[Series 10: T2 · axial · 5.0mm · 0.72mm/px · z∈[-74,+69]mm · 2 of 25 slices shown (1 of 2)]
[im 1/25]
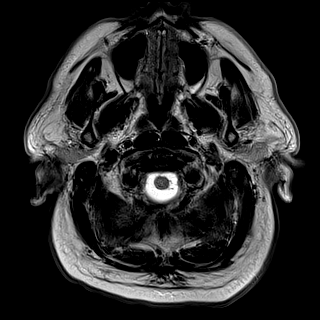
[im 25/25]
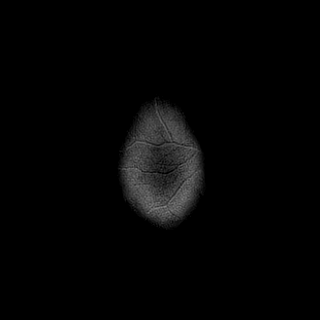

[Series 11: FLAIR · axial · 5.0mm · 0.45mm/px · z∈[-74,+69]mm · 2 of 25 slices shown]
[im 1/25]
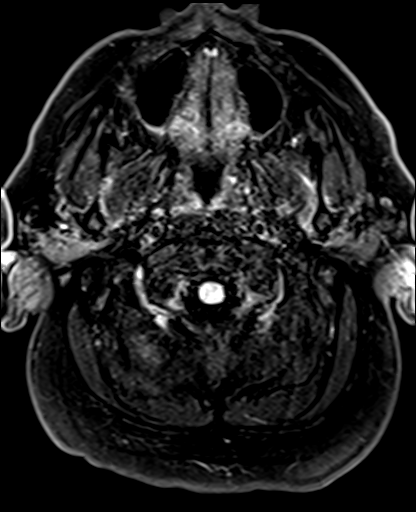
[im 25/25]
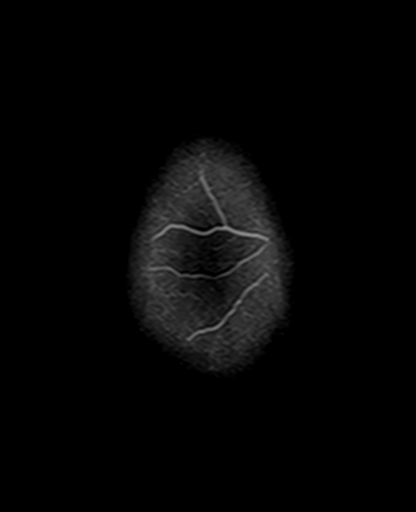

[Series 12: mag_images · axial · 3.0mm · 0.90mm/px · z∈[-90,+86]mm · 4 of 60 slices shown]
[im 1/60]
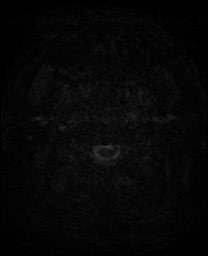
[im 20/60]
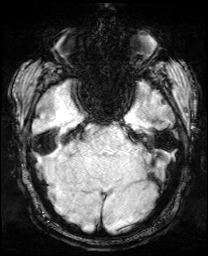
[im 40/60]
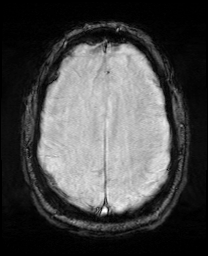
[im 60/60]
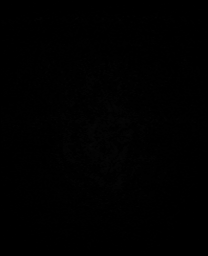

[Series 13: pha_images · axial · 3.0mm · 0.90mm/px · z∈[-90,+86]mm · 4 of 58 slices shown]
[im 1/58]
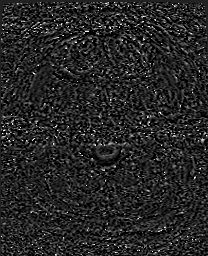
[im 20/58]
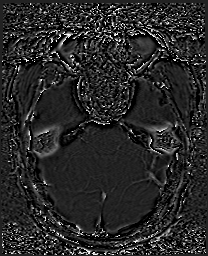
[im 39/58]
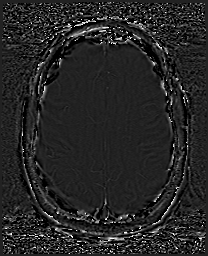
[im 58/58]
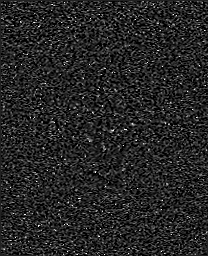

[Series 14: swi_images · axial · 3.0mm · 0.90mm/px · z∈[-90,+86]mm · 4 of 60 slices shown]
[im 1/60]
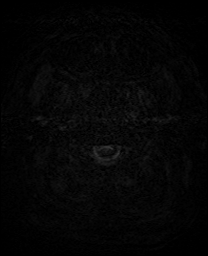
[im 20/60]
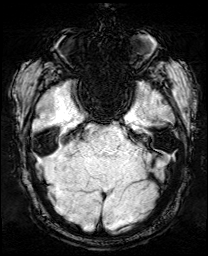
[im 40/60]
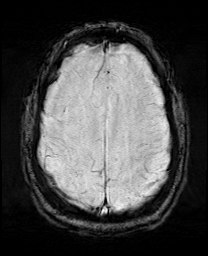
[im 60/60]
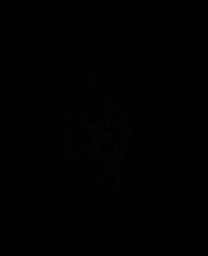

[Series 15: mip_images(sw) · axial · 24.0mm · 0.90mm/px · z∈[-79,+75]mm · 4 of 53 slices shown]
[im 1/53]
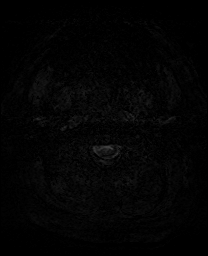
[im 18/53]
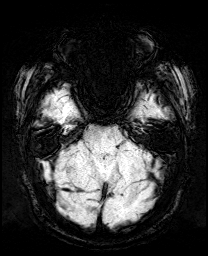
[im 35/53]
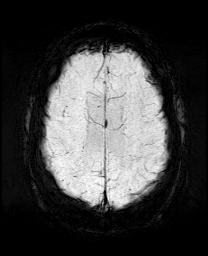
[im 53/53]
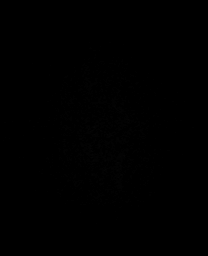

[Series 17: T2 · coronal · 5.0mm · 0.34mm/px · 2 of 32 slices shown (2 of 2)]
[im 1/32]
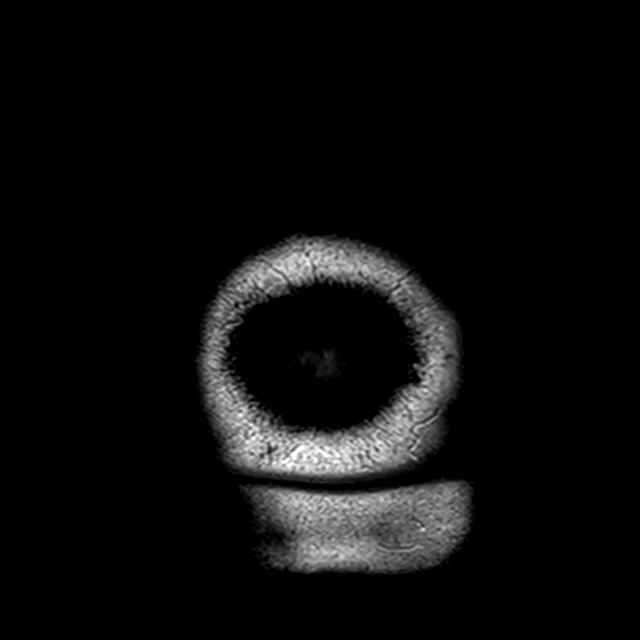
[im 32/32]
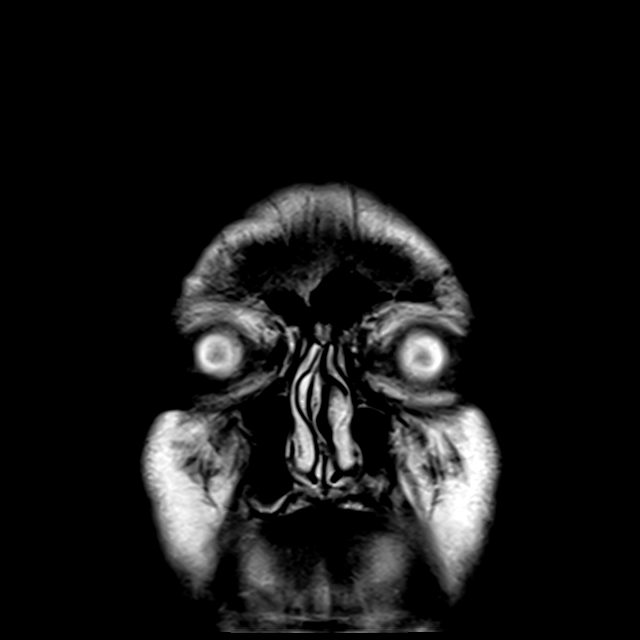

[44 of 48 positions shown; findings below may reference images not displayed]

FINDINGS: Brain: There is no evidence of an acute infarct, intracranial
hemorrhage, mass, midline shift, or extra-axial fluid collection.
Mild cerebral atrophy is within normal limits for age. The brain is
normal in signal.

Vascular: Major intracranial vascular flow voids are preserved.

Skull and upper cervical spine: Unremarkable bone marrow signal.
C3-4 ACDF.

Sinuses/Orbits: Unremarkable orbits. Paranasal sinuses and mastoid
air cells are clear.

Other: None.
IMPRESSION: Unremarkable appearance of the brain for age.

## 2021-10-17 IMAGING — CT CT ANGIO HEAD
1 of 11 series · 5 of 33 positions shown · IV contrast (APPLIED)
Comparison: MRI head and cervical spine 09/16/2019. CT head
07/15/2020

CLINICAL DATA: Dizziness. Vertebrobasilar insufficiency when
looking to the left

EXAM:
CT ANGIOGRAPHY HEAD AND NECK
TECHNIQUE: Multidetector CT imaging of the head and neck was performed using
the standard protocol during bolus administration of intravenous
contrast. Multiplanar CT image reconstructions and MIPs were
obtained to evaluate the vascular anatomy. Carotid stenosis
measurements (when applicable) are obtained utilizing NASCET
criteria, using the distal internal carotid diameter as the
denominator.
CONTRAST:  100mL OMNIPAQUE IOHEXOL 300 MG/ML  SOLN

[Series 11: ax thins · axial · 0.39mm/px · z∈[+1035,+1302]mm · 5 of 401 slices shown]
[im 67/401  soft-tissue]
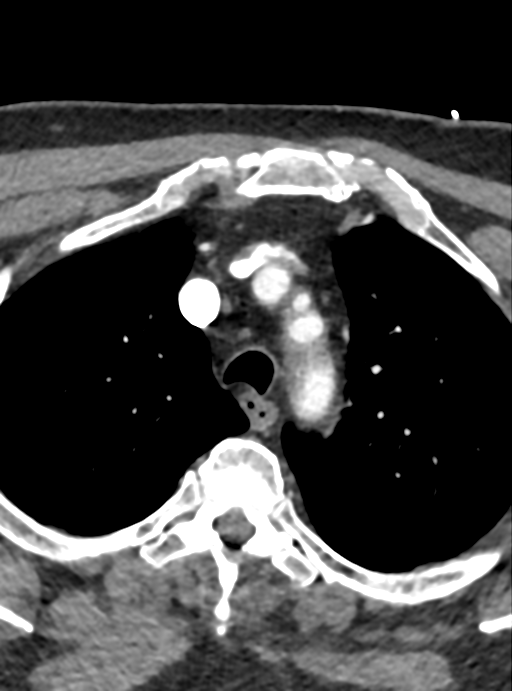
[im 134/401  bone]
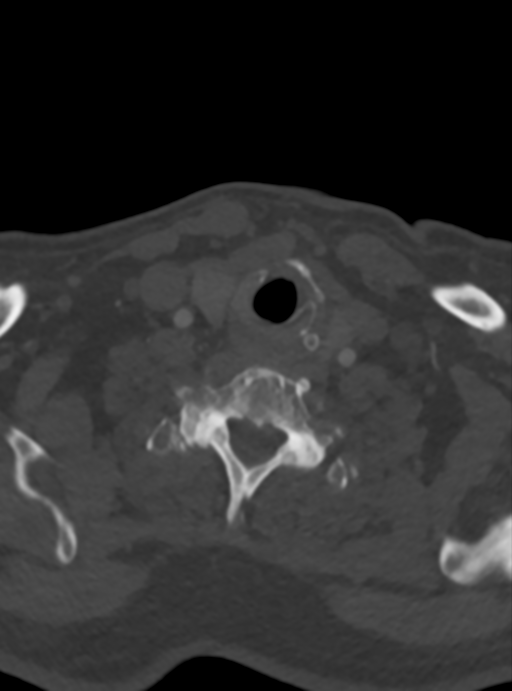
[im 201/401  soft-tissue]
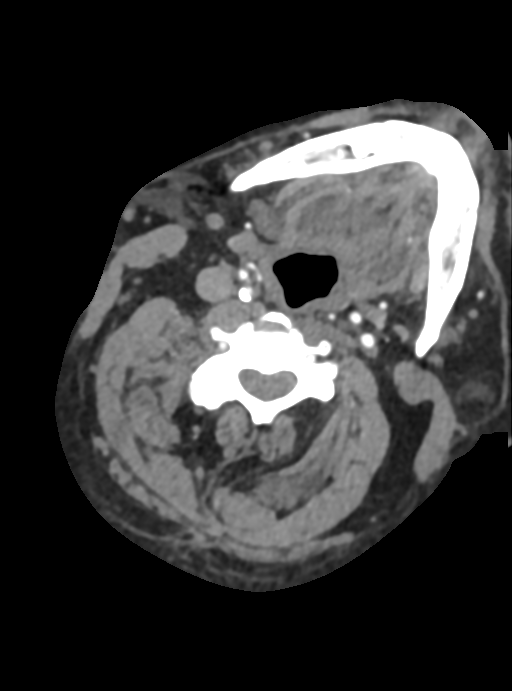
[im 267/401  bone]
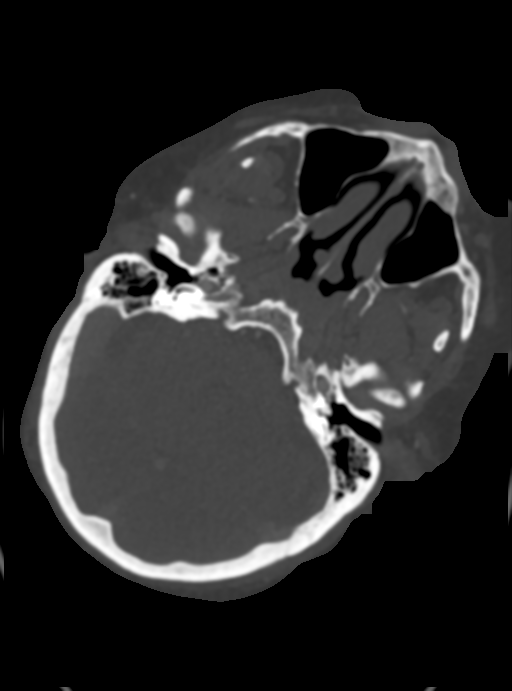
[im 334/401  soft-tissue]
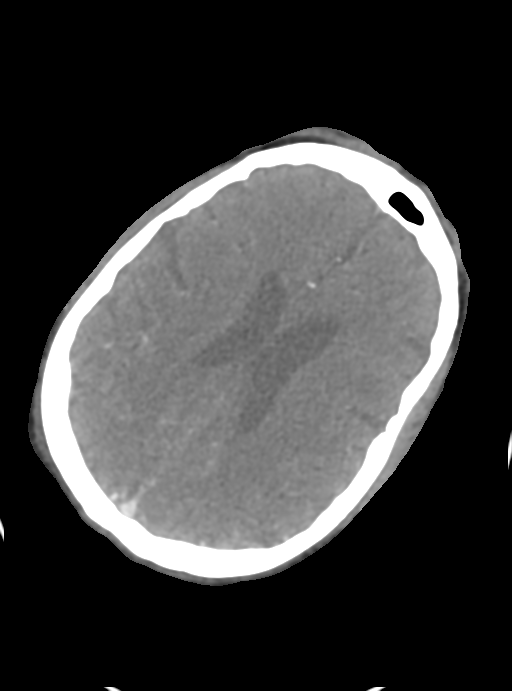

[5 of 33 positions shown; findings below may reference images not displayed]

FINDINGS: CT HEAD FINDINGS

Brain: Patient was scanned with head looking to the left as
requested.

Ventricle size and cerebral volume normal for age. Negative for
acute infarct, hemorrhage, mass.

Vascular: Negative for hyperdense vessel

Skull: Negative

Sinuses: Paranasal sinuses clear.

Orbits: Negative

Review of the MIP images confirms the above findings

CTA NECK FINDINGS

Aortic arch: Standard branching. Imaged portion shows no evidence of
aneurysm or dissection. No significant stenosis of the major arch
vessel origins.

Right carotid system: Atherosclerotic calcification proximal right
internal carotid artery narrowing the lumen to 1.5 mm corresponding
to approximately 55% diameter stenosis.

Left carotid system: Mild atherosclerotic disease in the proximal
left internal carotid artery. Minimal luminal diameter 2 mm
corresponding to approximately 50% diameter stenosis.

Vertebral arteries: Short segment occlusion versus severe stenosis
of the origin of the right vertebral artery. There is reconstitution
of a small right vertebral which is patent to the basilar. There is
atherosclerotic plaque in the distal right vertebral artery.

Severe stenosis at the origin of the left vertebral artery which is
then patent to the basilar.

Skeleton: ACDF with anterior plate and screws C3 through C6. Solid
interbody fusion C6-7. Cervical spondylosis. Prominent osteophyte on
the left at C5-6. No acute skeletal abnormality.

Other neck: Negative for mass or adenopathy.

Upper chest: Lung apices clear bilaterally.

Review of the MIP images confirms the above findings

CTA HEAD FINDINGS

Anterior circulation: Mild atherosclerotic disease in the cavernous
carotid bilaterally without stenosis. Middle cerebral arteries
patent bilaterally without stenosis. Moderate stenosis right A2
segment. Left anterior cerebral artery patent without stenosis.

Posterior circulation: Mild to moderate stenosis distal vertebral
artery bilaterally. Both vertebral arteries patent to the basilar.
PICA not well visualized. Basilar tortuous and widely patent.
Superior cerebellar and posterior cerebral arteries patent
bilaterally. Fetal origin of the right posterior cerebral artery. No
large vessel occlusion or aneurysm.

Venous sinuses: Normal venous enhancement

Anatomic variants: None

Review of the MIP images confirms the above findings
IMPRESSION: 1. No acute intracranial abnormality.
2. Severe stenosis versus short segment occlusion of the proximal
right vertebral artery which appears to be due to atherosclerotic
disease. Right vertebral artery reconstitutes with small lumen.
Additional mild to moderate stenosis distal right vertebral artery.
3. Severe focal stenosis origin of left vertebral artery which is
patent to the basilar with mild to moderate stenosis distally.
4. 55% diameter stenosis proximal right internal carotid artery due
to calcific plaque.
5. 50% diameter stenosis proximal left internal carotid artery due
to out atherosclerotic plaque.
6. Moderate stenosis right A2 segment.
7. Negative for intracranial large vessel occlusion.

## 2021-10-17 IMAGING — MR MR CERVICAL SPINE W/O CM
7 of 16 series · 21 of 48 positions shown · non-contrast
Comparison: MRI head 07/15/2020

CLINICAL DATA: Acute neuro deficit. Left-sided numbness and
weakness. Rule out myelopathy

EXAM:
MRI HEAD WITHOUT CONTRAST
MRI CERVICAL SPINE WITHOUT CONTRAST
TECHNIQUE: Multiplanar, multiecho pulse sequences of the brain and surrounding
structures, and cervical spine, to include the craniocervical
junction and cervicothoracic junction, were obtained without
intravenous contrast.

[Series 3: DWI · axial · 3.0mm · 1.09mm/px · z∈[-59,-3]mm · 4 of 104 slices shown]
[im 1/104]
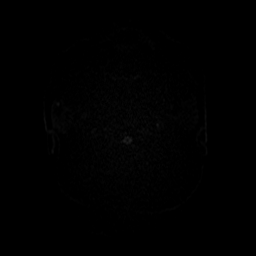
[im 13/104]
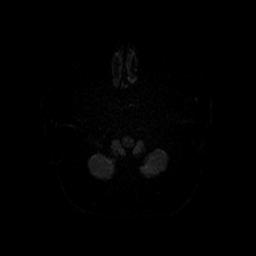
[im 26/104]
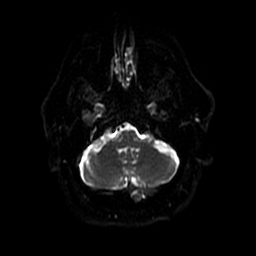
[im 39/104]
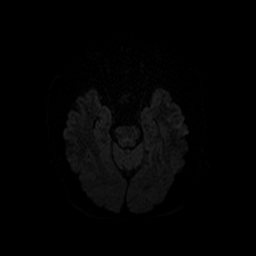

[Series 6: T1 · sagittal · 5.0mm · 0.47mm/px · 2 of 25 slices shown (1 of 3)]
[im 1/25]
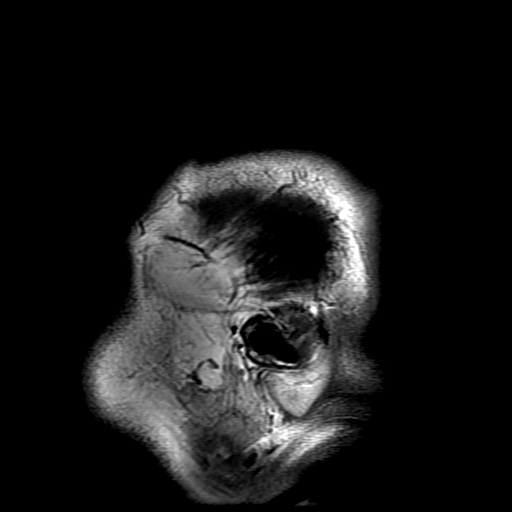
[im 25/25]
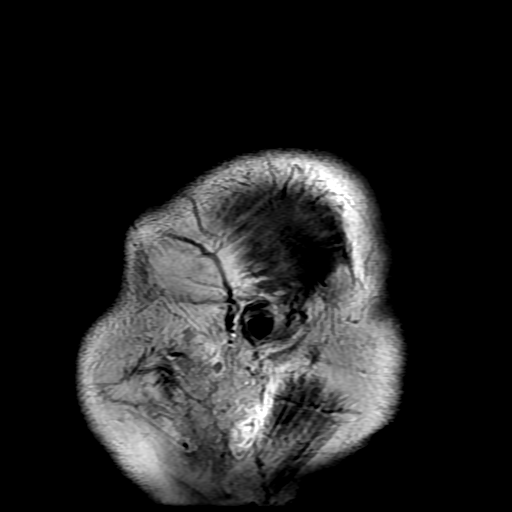

[Series 7: T2 · axial · 5.0mm · 0.43mm/px · z∈[-45,+102]mm · 2 of 26 slices shown (1 of 3)]
[im 1/26]
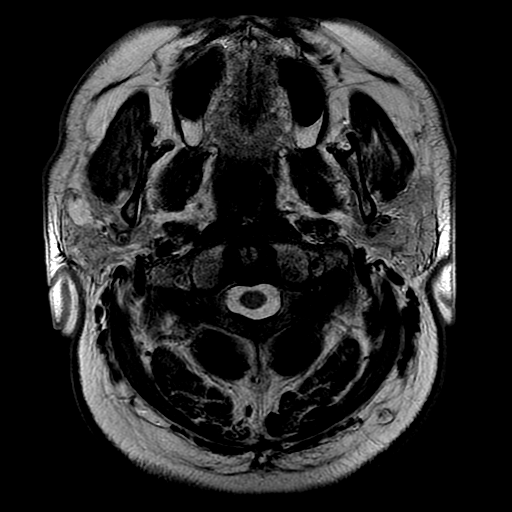
[im 26/26]
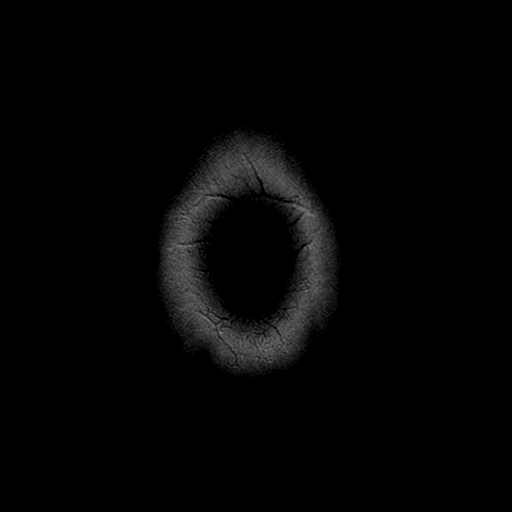

[Series 11: T1 · axial · 3.0mm · 0.47mm/px · z∈[-45,+107]mm · 8 of 104 slices shown (2 of 3)]
[im 1/104]
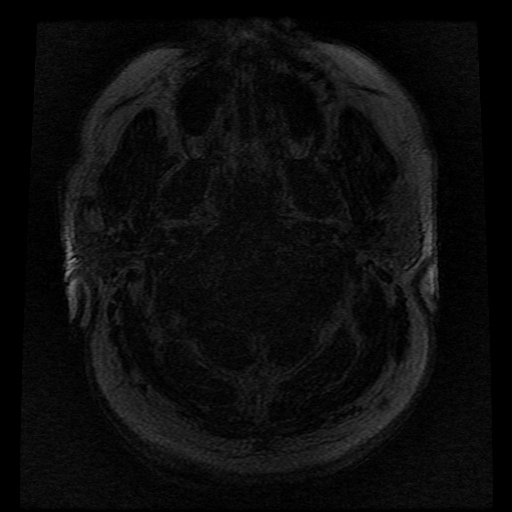
[im 15/104]
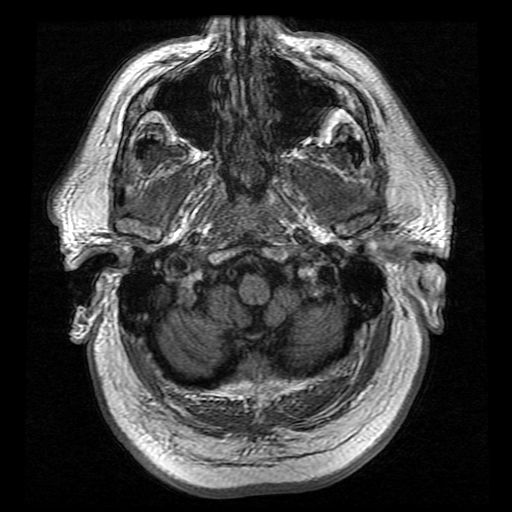
[im 30/104]
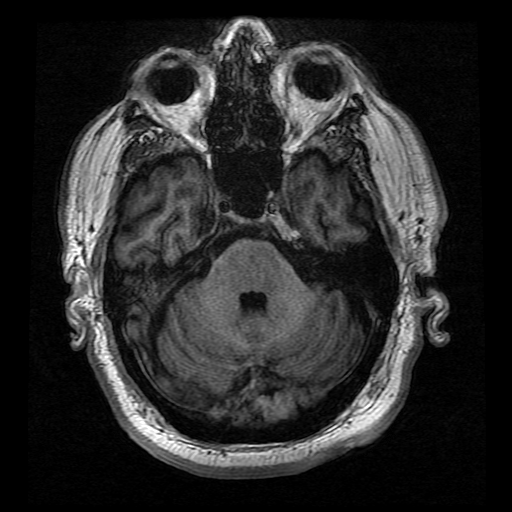
[im 45/104]
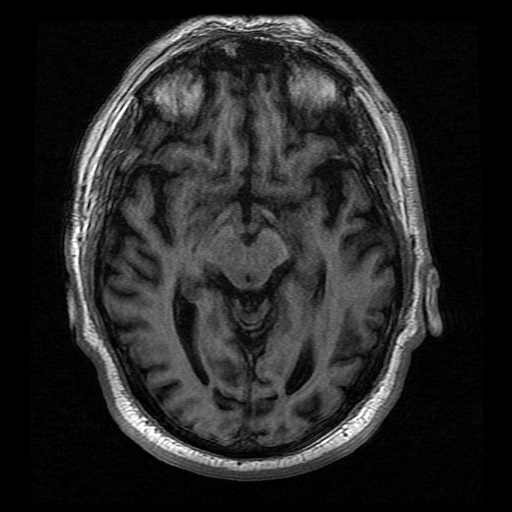
[im 59/104]
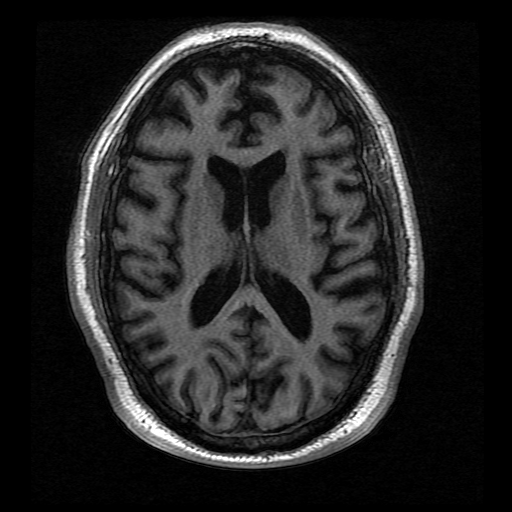
[im 74/104]
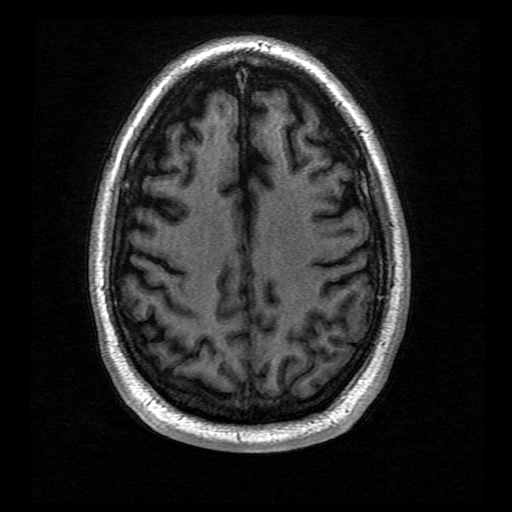
[im 89/104]
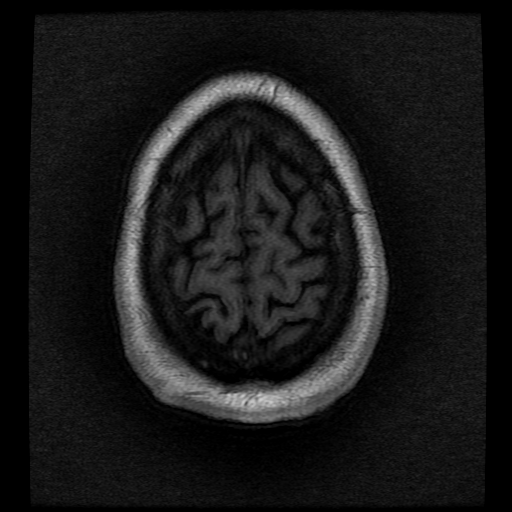
[im 104/104]
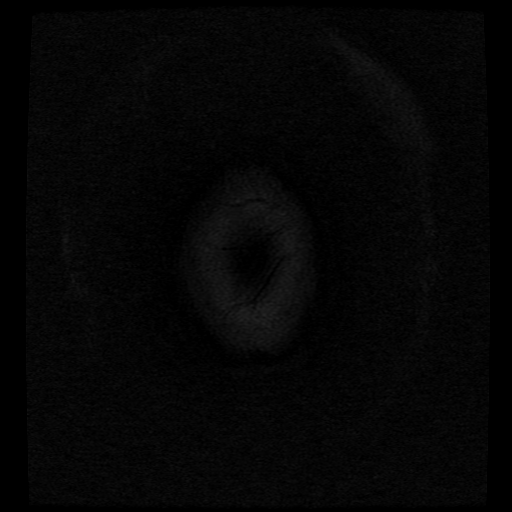

[Series 12: T2 · coronal · 5.0mm · 0.39mm/px · 2 of 31 slices shown (2 of 3)]
[im 1/31]
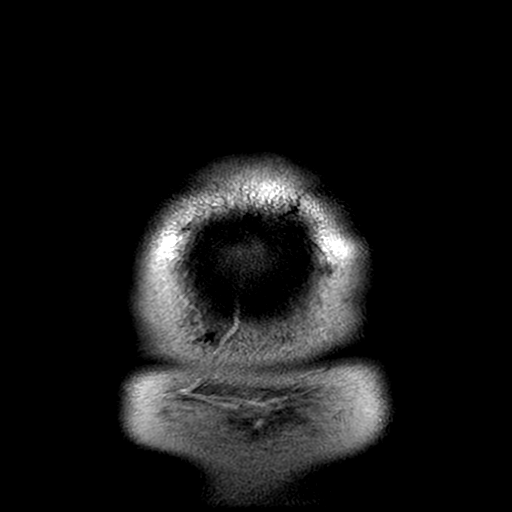
[im 31/31]
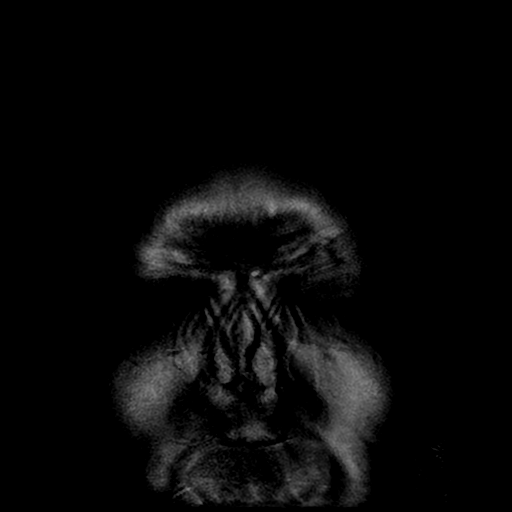

[Series 13: T1 · sagittal · 3.0mm · 0.43mm/px · 1 of 15 slices shown (3 of 3)]
[im 1/15]
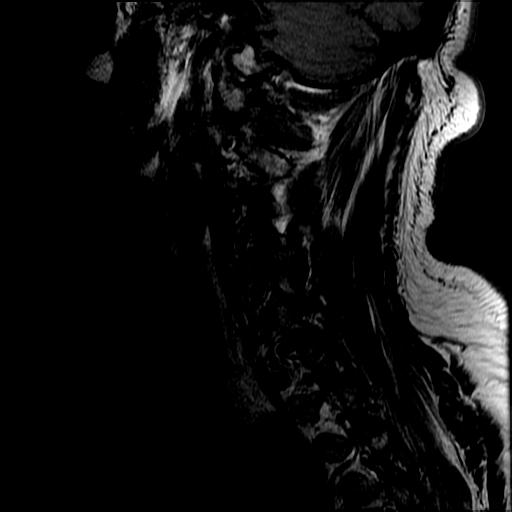

[Series 16: T2 · axial · 3.0mm · 0.39mm/px · z∈[-217,-117]mm · 2 of 31 slices shown (3 of 3)]
[im 1/31]
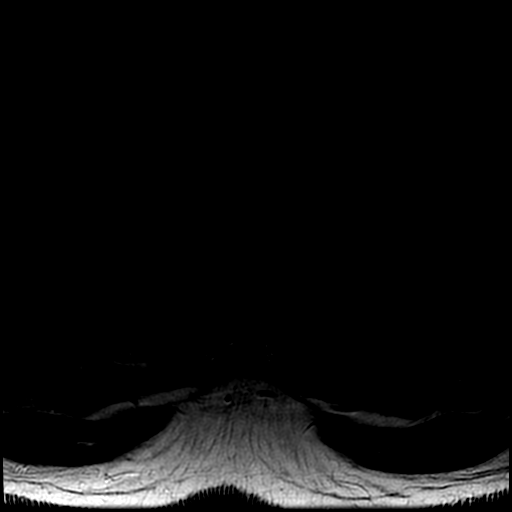
[im 31/31]
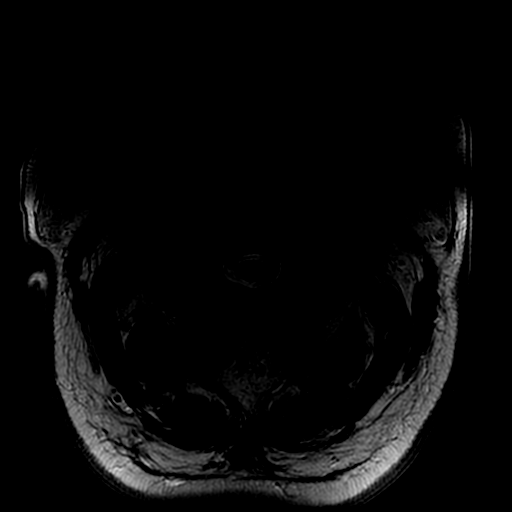

[21 of 48 positions shown; findings below may reference images not displayed]

FINDINGS: MRI HEAD FINDINGS

Brain: Mild atrophy without hydrocephalus. Negative for acute
infarct. No significant chronic ischemic change. Negative for
hemorrhage or mass.

Vascular: Normal arterial flow voids.

Skull and upper cervical spine: No acute skeletal abnormality.

Sinuses/Orbits: Paranasal sinuses clear.  Negative orbit

Other: None

MRI CERVICAL SPINE FINDINGS

Alignment: Slight anterolisthesis C7-T1 otherwise normal alignment.
Straightening of the cervical lordosis.

Vertebrae: Negative for fracture or mass.

Hardware: ACDF with anterior plate and screws extending from C3
through C6. Solid interbody bone fusion C6-7 without hardware.

Cord: Mild cord hyperintensity bilaterally at C5-6.

Posterior Fossa, vertebral arteries, paraspinal tissues: Negative

Disc levels: C2-3: Bilateral facet degeneration. No significant
spinal or foraminal stenosis

C3-4: ACDF. Diffuse posterior uncinate spurring. Cord flattening
with moderate spinal stenosis. Moderate foraminal stenosis
bilaterally. Bilateral facet hypertrophy contributes to foraminal
narrowing.

C4-5: ACDF. Posterior osteophyte with central disc protrusion which
may be chronic. Bilateral facet hypertrophy. Moderate to severe left
foraminal encroachment and moderate right foraminal encroachment.
Cord flattening with mild spinal stenosis.

C5-6: Large central and left-sided osteophyte. Severe left foraminal
encroachment. Cord flattening on the left with moderate spinal
stenosis. Bilateral cord hyperintensity. Bilateral facet
degeneration. Moderate right foraminal stenosis.

C6-7: Solid interbody fusion.  No significant stenosis

C7-T1: Disc and facet degeneration. Moderate to severe foraminal
encroachment bilaterally due to spurring. Mild spinal stenosis.

Disc degeneration and spurring at T1-2 and T2-3 with foraminal
encroachment bilaterally.
IMPRESSION: ACDF C3 through C7.

Multilevel spinal and foraminal stenosis due to spurring.

Large osteophyte on the left at C5-6 with severe left foraminal
encroachment moderate spinal stenosis. Bilateral cord hyperintensity
at C5-6.

## 2021-10-19 IMAGING — CR DG CERVICAL SPINE 1V
1 series · 1 of 1 positions shown · non-contrast
Comparison: Portable exam 7677 hours compared to 11/10/2019

CLINICAL DATA: Posterior cervical fusion

EXAM:
DG CERVICAL SPINE - 1 VIEW

[AP]
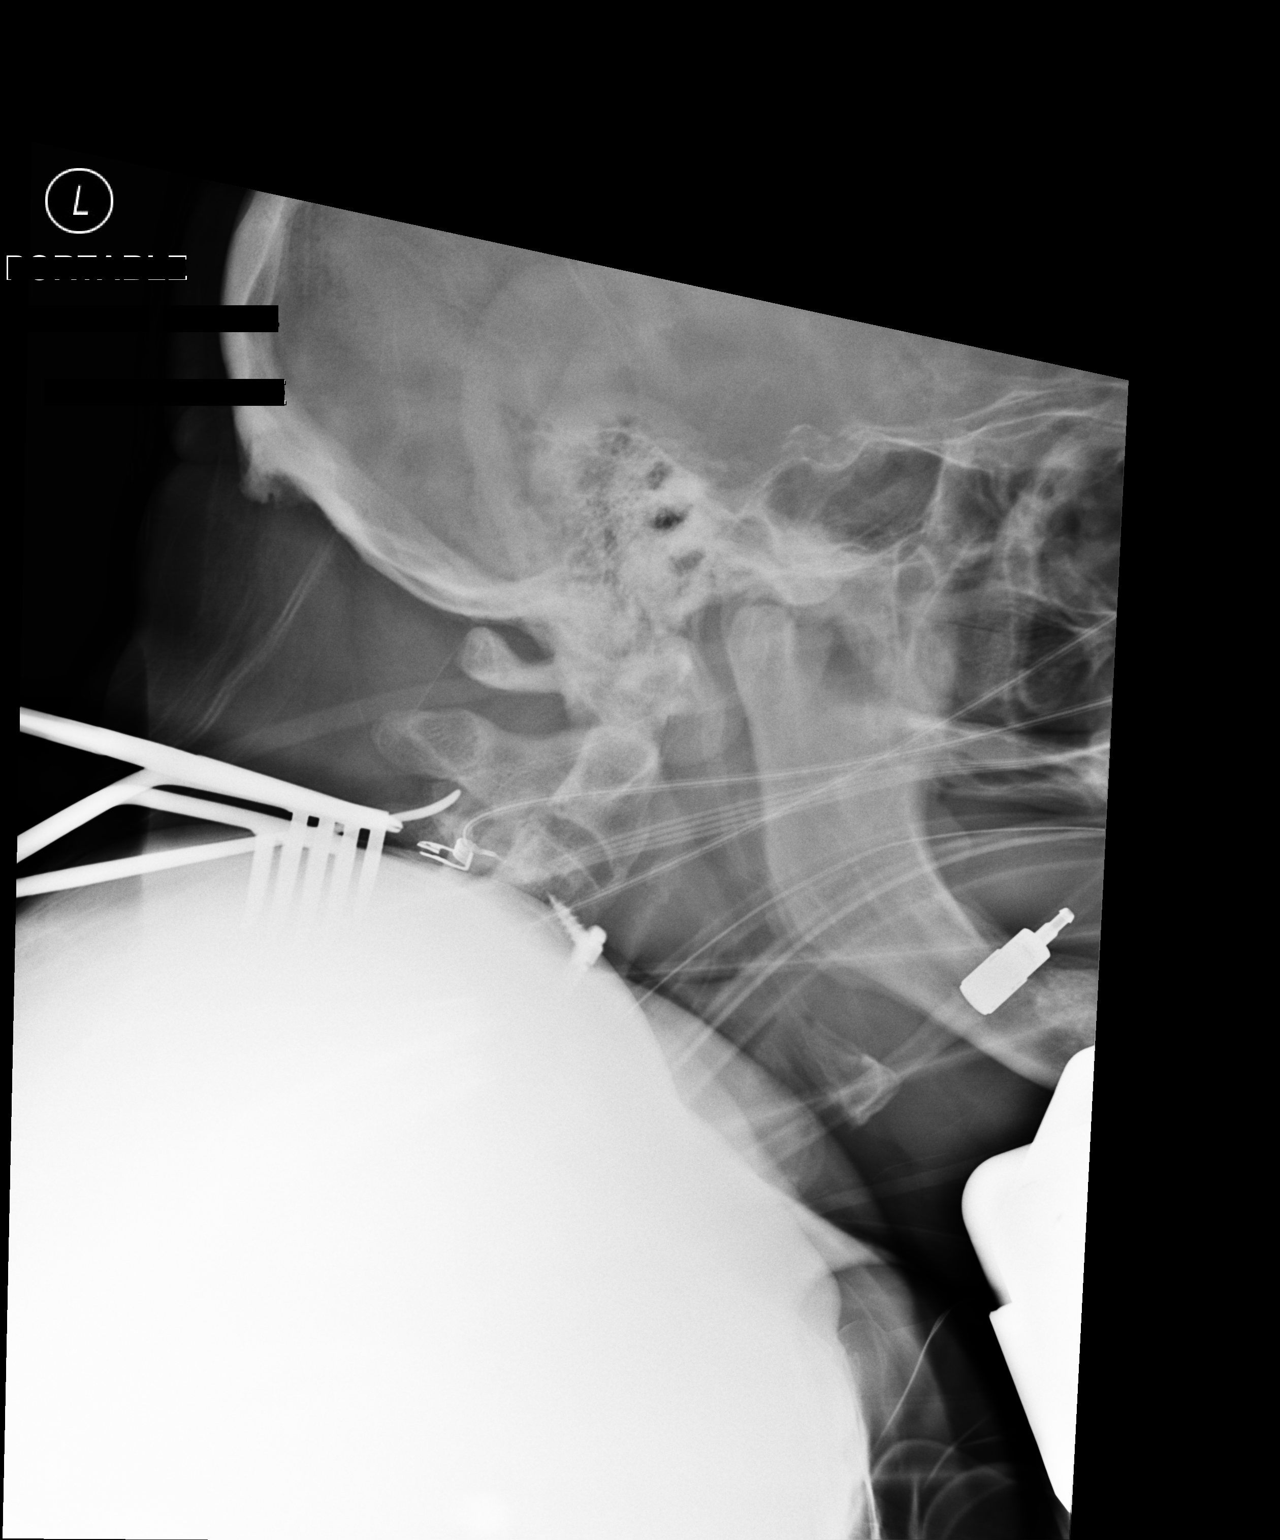

[1 of 1 positions shown; findings below may reference images not displayed]

FINDINGS: Curved metallic probe via dorsal approach projects dorsal to the
C2-C3 disc space.

Tissue spreader projects dorsal to C3-C4.
IMPRESSION: Dorsal localization of the C2-C3 disc space level.

## 2021-12-10 IMAGING — US US CAROTID DUPLEX BILAT
1 series · 13 of 24 positions shown · non-contrast
Comparison: No prior duplex

CLINICAL DATA: 74-year-old male with vertebrobasilar insufficiency

EXAM:
BILATERAL CAROTID DUPLEX ULTRASOUND
TECHNIQUE: Gray scale imaging, color Doppler and duplex ultrasound were
performed of bilateral carotid and vertebral arteries in the neck.

[Series 1: us carotid bilateral · 13 of 69 slices shown]
[im 1/69]
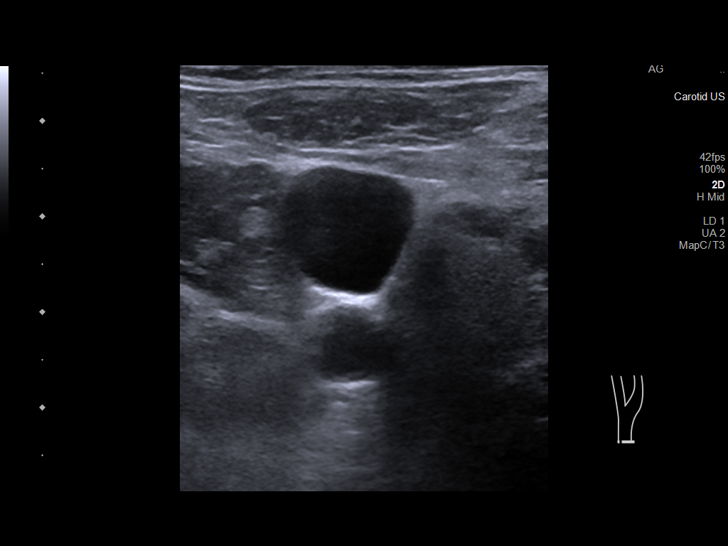
[im 6/69]
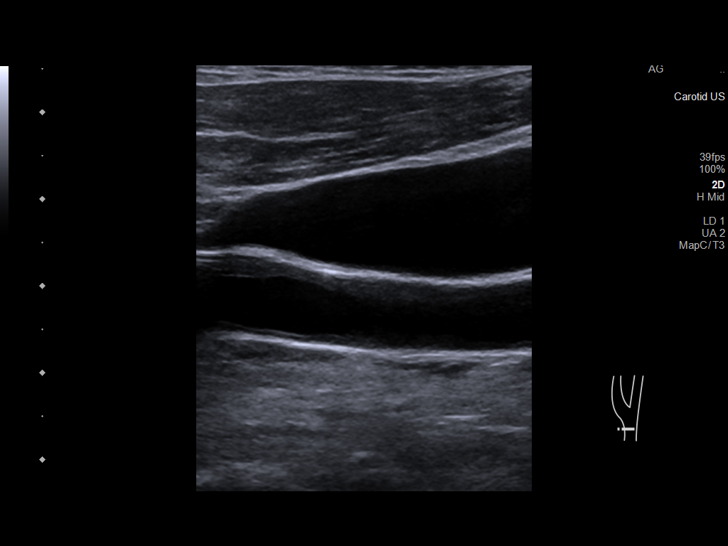
[im 12/69]
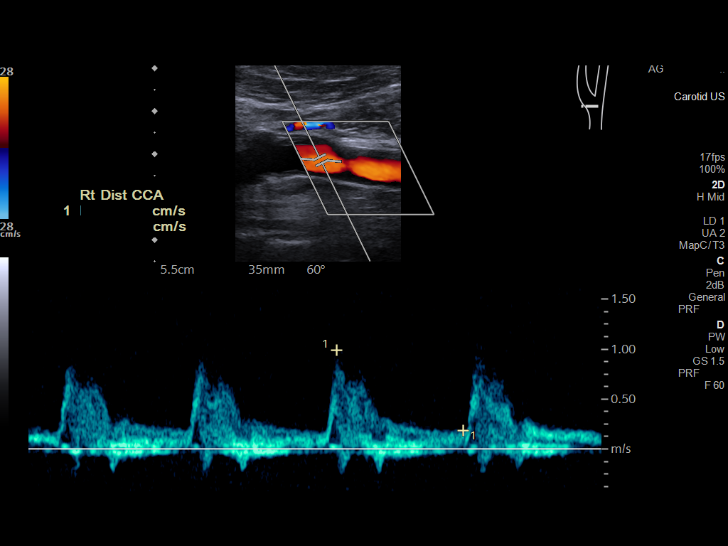
[im 18/69]
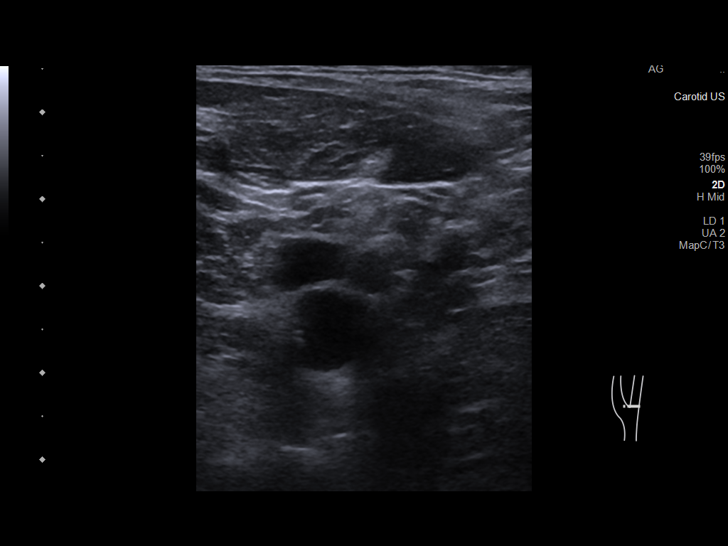
[im 24/69]
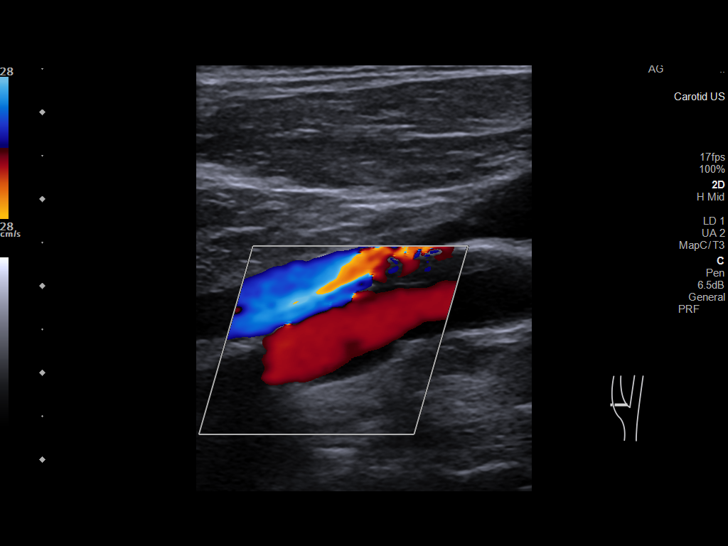
[im 30/69]
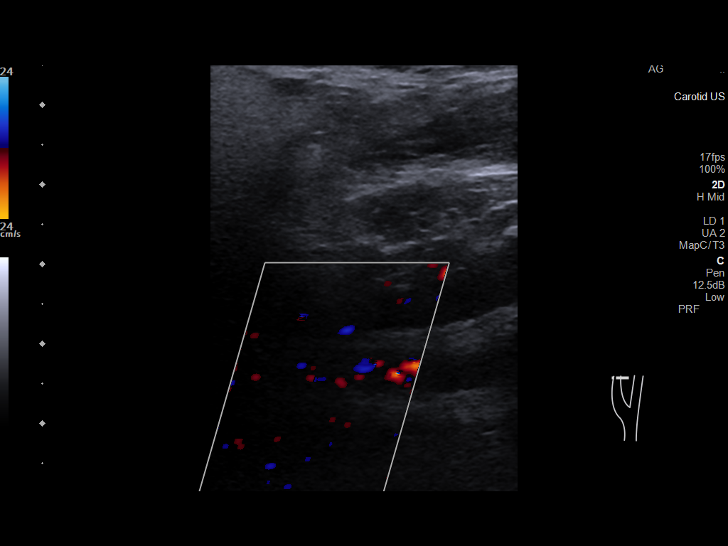
[im 36/69]
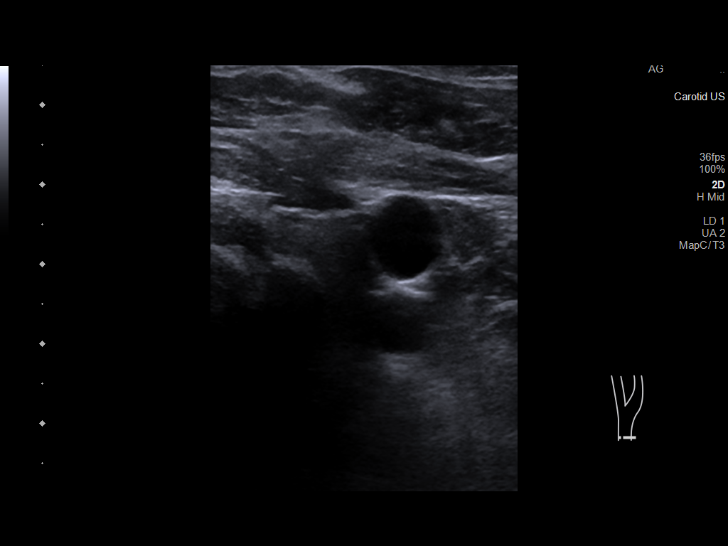
[im 39/69]
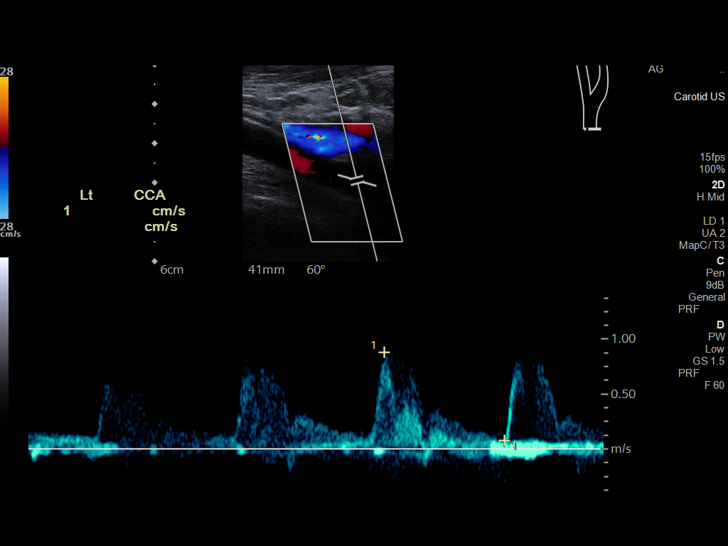
[im 45/69]
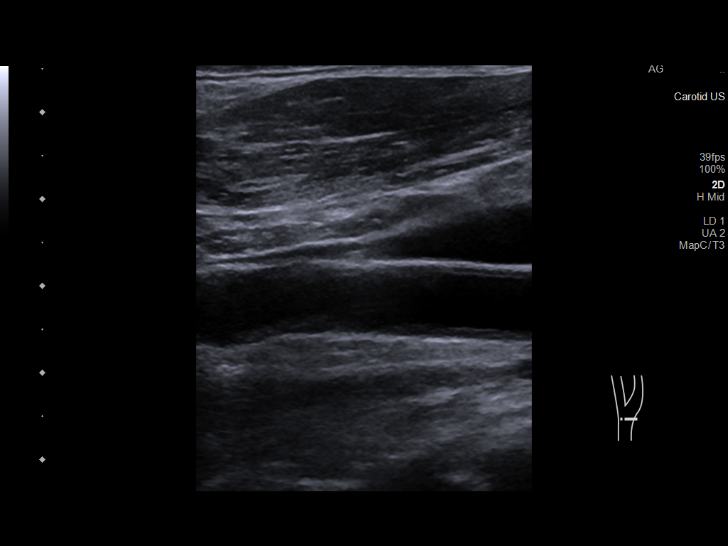
[im 51/69]
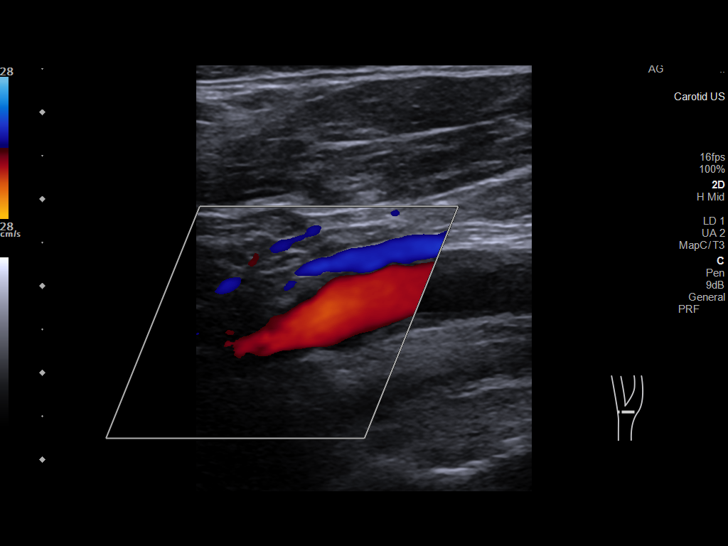
[im 57/69]
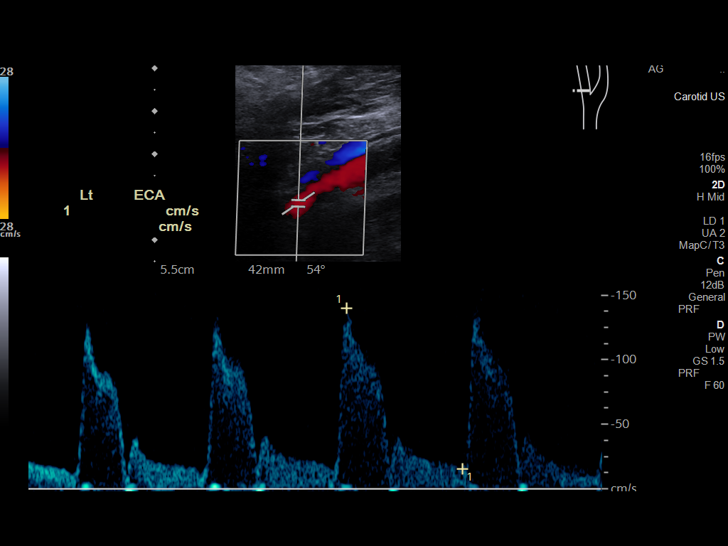
[im 63/69]
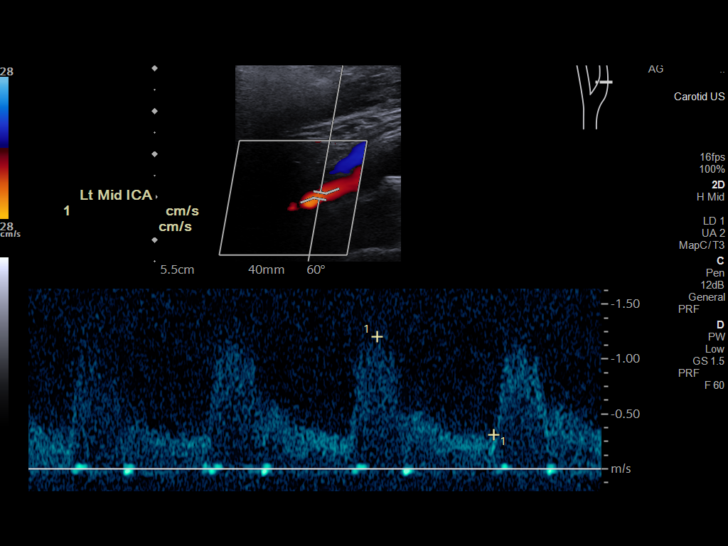
[im 69/69]
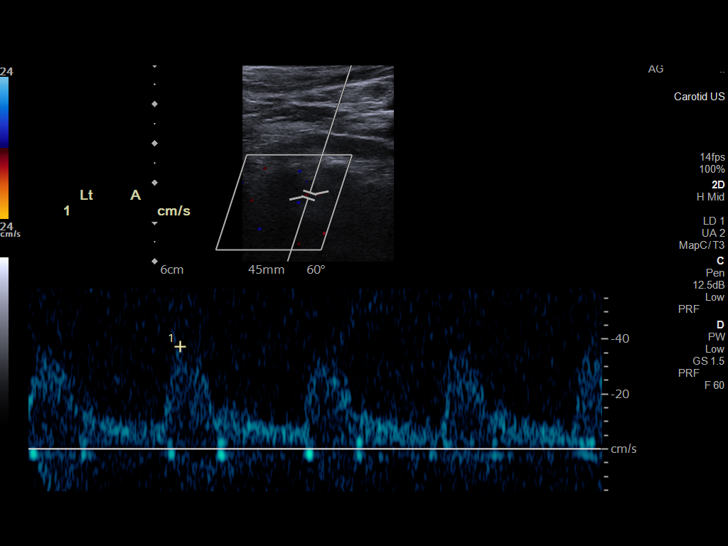

[13 of 24 positions shown; findings below may reference images not displayed]

FINDINGS: Criteria: Quantification of carotid stenosis is based on velocity
parameters that correlate the residual internal carotid diameter
with NASCET-based stenosis levels, using the diameter of the distal
internal carotid lumen as the denominator for stenosis measurement.

The following velocity measurements were obtained:

RIGHT

ICA:  Systolic 142 cm/sec, Diastolic 35 cm/sec

CCA:  100 cm/sec

SYSTOLIC ICA/CCA RATIO:

ECA:  140 cm/sec

LEFT

ICA:  Systolic 176 cm/sec, Diastolic 47 cm/sec

CCA:  103 cm/sec

SYSTOLIC ICA/CCA RATIO:

ECA:  140 cm/sec

Right Brachial SBP: Not acquired

Left Brachial SBP: Not acquired

RIGHT CAROTID ARTERY: No significant calcifications of the right
common carotid artery. Intermediate waveform maintained.
Heterogeneous and partially calcified plaque at the right carotid
bifurcation. No significant lumen shadowing. Low resistance waveform
of the right ICA. No significant tortuosity.

RIGHT VERTEBRAL ARTERY: No flow identified within the right
vertebral artery.

LEFT CAROTID ARTERY: No significant calcifications of the left
common carotid artery. Intermediate waveform maintained.
Heterogeneous and partially calcified plaque at the left carotid
bifurcation without significant lumen shadowing. Low resistance
waveform of the left ICA. No significant tortuosity.

LEFT VERTEBRAL ARTERY:  Antegrade flow with low resistance waveform.
IMPRESSION: Right:

Color duplex indicates minimal heterogeneous and calcified plaque,
with no hemodynamically significant stenosis by duplex criteria in
the extracranial cerebrovascular circulation.

Left:

Heterogeneous plaque at the left carotid bifurcation, with
discordant results regarding degree of stenosis by established
duplex criteria. Peak velocity suggests 50%-69% stenosis, with the
ICA/ CCA ratio suggesting a lesser degree of stenosis. If
establishing a more accurate degree of stenosis is required,
cerebral angiogram should be considered, or as a second best test,
CTA.

No flow identified within the right vertebral artery.

Unremarkable waveform of the left vertebral artery.

## 2021-12-30 NOTE — Progress Notes (Signed)
Cardiology Office Note   Date:  01/02/2022   ID:  [name] Keidel., DOB Aug 15, 1946, MRN [id number]  PCP:  Curly Rim, MD  Cardiologist:  Minus Breeding, MD EP: None  Chief Complaint  Patient presents with   Shortness of Breath      History of Present Illness: [name] Voit. is a 76 y.o. male with a PMH of CAD s/p angioplasty in early 1990s, HTN, HLD, mild carotid artery disease, DM type 2,who previously saw Dr. Bronson Ing.  His last ischemic evaluation was a NST 11/2018 which was a low risk study with a medium defect of moderate severity in the mid-inferoseptal and mid-inferior location which was felt to be 2/2 soft tissue attenuation; EF 54%. 12/2019.   He was admitted to [facility] from 07/04/20-07/06/20 after presenting with acute left arm weakness. CT Head and MRI brain were negative for acute findings. Bilateral carotid dopplers showed mild bilateral carotid artery disease. Echo showed EF 55-60%, G1DD, mild to moderate LVH, mild LAE, negative bubble study, and no significant valvular abnormalities. 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  He was started on low dose atorvastatin in addition to home fenofibrate for elevated triglycerides. He was subsequently seen in the ED 07/15/20 for similar complaints of left arm weakness with possible facial numbness. Code stroke activated and he underwent a CT head and MRI brain which were again negative.   He had redo spine surgery.    He presents for one year follow up.  He has had some progressive shortness of breath.  He says he is short of breath walking 800 feet with a garbage cans.  He is not having shortness of breath at rest.  He is not having chest pressure, neck or arm discomfort.  He does have to walk with a cane for balance.  He thinks this has been worsening shortness of breath over the last year or so.  He is not  noticing any palpitations, presyncope or syncope.  He has not had edema.  His weight has been slowly progressive.   Past Medical History:  Diagnosis Date   CAD (coronary artery disease)    Cancer ([place])    Diabetes ([place])    Dyslipidemia    Gout    History of kidney stones    HTN (hypertension)    Myocardial infarction [facility])     Past Surgical History:  Procedure Laterality Date   ANTERIOR CERVICAL DECOMP/DISCECTOMY FUSION Left 10/20/2019   Procedure: Left Cervical Three-Four Cervical Four-Five Cervical Five-Six Anterior cervical decompression/discectomy/fusion;  Surgeon: Judith Part, MD;  Location: [place];  Service: Neurosurgery;  Laterality: Left;  Left Cervical Three-Four Cervical Four-Five Cervical Five-Six Anterior cervical decompression/discectomy/fusion   BACK SURGERY     left knee surgery     left shoulder surgery     POSTERIOR CERVICAL FUSION/FORAMINOTOMY N/A 09/17/2020   Procedure: CERVICAL THREE TO CERVICAL SIX POSTERIOR CERVICAL DECOMPRESSION;  Surgeon: Judith Part, MD;  Location: [place];  Service: Neurosurgery;  Laterality: N/A;   right knee surgery       Current Outpatient Medications  Medication Sig Dispense Refill   acetaminophen (TYLENOL) 325 MG tablet Take 1-2 tablets (325-650 mg total) by mouth every 4 (four) hours as needed for mild pain.     aspirin EC 81 MG tablet Take 1 tablet (81 mg total) by mouth daily. Swallow whole. 90 tablet 3  atorvastatin (LIPITOR) 20 MG tablet Take 1 tablet by mouth daily.     b complex vitamins tablet Take 1 tablet by mouth daily.     colchicine 0.6 MG tablet Take 0.6 mg by mouth daily as needed (gout flare).     gabapentin (NEURONTIN) 300 MG capsule Take 2 capsules (600 mg total) by mouth 2 (two) times daily. 120 capsule 1   metFORMIN (GLUCOPHAGE) 850 MG tablet Take 1 tablet (850 mg total) by mouth 2 (two) times daily with a meal. 60 tablet 0   metoprolol succinate (TOPROL-XL) 50 MG 24 hr tablet TAKE 1 TABLET BY MOUTH  ONCE DAILY. TAKE WITH OR IMMEDIATELY FOLLOWING A MEAL 90 tablet 2   Omega-3 Fatty Acids (FISH OIL) 1000 MG CAPS Take 1,000 mg by mouth 2 (two) times daily.      omeprazole (PRILOSEC) 20 MG capsule Take 20 mg by mouth daily.   2   LIFESCAN FINEPOINT LANCETS MISC Use to check blood sugar 3 time(s) daily     No current facility-administered medications for this visit.    Allergies:   Simvastatin    ROS:  Please see the history of present illness.   Otherwise, review of systems are positive for none.   All other systems are reviewed and negative.    PHYSICAL EXAM: VS:  BP 138/76    Pulse 87    Ht 5\' 10"  (1.778 m)    Wt 226 lb (102.5 kg)    BMI 32.43 kg/m  , BMI Body mass index is 32.43 kg/m. GENERAL:  Well appearing NECK:  No jugular venous distention, waveform within normal limits, carotid upstroke brisk and symmetric, no bruits, no thyromegaly LUNGS:  Clear to auscultation bilaterally CHEST:  Unremarkable HEART:  PMI not displaced or sustained,S1 and S2 within normal limits, no S3, no S4, no clicks, no rubs, no murmurs ABD:  Flat, positive bowel sounds normal in frequency in pitch, no bruits, no rebound, no guarding, no midline pulsatile mass, no hepatomegaly, no splenomegaly EXT:  2 plus pulses throughout, mild bilateral edema, no cyanosis no clubbing   EKG:  EKG is  ordered today. Sinus rhythm, rate 87, right bundle branch block, left anterior fascicular block, no acute ST-T wave changes.  This is not significant and previous other than no ectopy  Recent Labs: No results found for requested labs within last 8760 hours.    Lipid Panel    Component Value Date/Time   CHOL 126 09/16/2020 1214   TRIG 281 (H) 09/16/2020 1214   HDL 32 (L) 09/16/2020 1214   CHOLHDL 3.9 09/16/2020 1214   VLDL 56 (H) 09/16/2020 1214   LDLCALC 38 09/16/2020 1214      Wt Readings from Last 3 Encounters:  01/02/22 226 lb (102.5 kg)  12/27/20 217 lb (98.4 kg)  11/13/20 222 lb (100.7 kg)       Other studies Reviewed: Additional studies/ records that were reviewed today include: Labs  Echocardiogram 07/05/20: Summary    1. The left ventricle is normal in size with mildly to moderately increased  wall thickness.    2. The left ventricular systolic function is normal, LVEF is visually  estimated at 55-60%.    3. There is grade I diastolic dysfunction (impaired relaxation).    4. The left atrium is mildly dilated in size.    5. The right ventricle is normal in size, with normal systolic function.    6. Negative bubble study.   Carotid duplex 07/06/20: IMPRESSION:  1.  Mild atherosclerotic disease involving bilateral carotid  arteries. Estimated degree of stenosis in the internal carotid  arteries is less than 50% bilaterally.  2. Antegrade flow in the right vertebral artery. Left vertebral  artery is not visualized but this vessel was patent on the recent  CTA examination.     ASSESSMENT AND PLAN:  CAD s/p remote angioplasty:   Given his shortness of breath I think it is reasonable to repeat a stress test.  He would be able to walk on a treadmill.  Therefore, he will have a [employer].  HTN:    The blood pressure is at target.  No change in therapy.   HLD: LDL was 52 and HDL 34 in June of last year.  No change in therapy.  Carotid artery disease: This was mild in 2021.  No further imaging at this time.  DM type 2:   A1c was 6.8 and is followed by Corrington, Kip A, MD   Current medicines are reviewed at length with the patient today.  The patient does not have concerns regarding medicines.  The following changes have been made: None  Labs/ tests ordered today include:   Orders Placed This Encounter  Procedures   NM Myocar Multi W/Spect W/Wall Motion / EF   EKG 12-Lead     Disposition:   FU with me in 1 year  Signed, Minus Breeding, MD  01/02/2022 3:53 PM

## 2022-01-02 ENCOUNTER — Encounter: Payer: Self-pay | Admitting: Cardiology

## 2022-01-02 ENCOUNTER — Ambulatory Visit (INDEPENDENT_AMBULATORY_CARE_PROVIDER_SITE_OTHER): Payer: Medicare HMO | Admitting: Cardiology

## 2022-01-02 ENCOUNTER — Other Ambulatory Visit: Payer: Self-pay

## 2022-01-02 VITALS — BP 138/76 | HR 87 | Ht 70.0 in | Wt 226.0 lb

## 2022-01-02 DIAGNOSIS — E785 Hyperlipidemia, unspecified: Secondary | ICD-10-CM | POA: Diagnosis not present

## 2022-01-02 DIAGNOSIS — I10 Essential (primary) hypertension: Secondary | ICD-10-CM

## 2022-01-02 DIAGNOSIS — R001 Bradycardia, unspecified: Secondary | ICD-10-CM | POA: Diagnosis not present

## 2022-01-02 DIAGNOSIS — I2510 Atherosclerotic heart disease of native coronary artery without angina pectoris: Secondary | ICD-10-CM

## 2022-01-02 NOTE — Patient Instructions (Signed)
Medication Instructions:  The current medical regimen is effective;  continue present plan and medications.  *If you need a refill on your cardiac medications before your next appointment, please call your pharmacy*  Testing/Procedures: Your physician has requested that you have a lexiscan myoview. This will be completed at [facility].  You will be contacted to be scheduled.  Check in at the Main Entrance.    Follow-Up: At [facility], you and your health needs are our priority.  As part of our continuing mission to provide you with exceptional heart care, we have created designated Provider Care Teams.  These Care Teams include your primary Cardiologist (physician) and Advanced Practice Providers (APPs -  Physician Assistants and Nurse Practitioners) who all work together to provide you with the care you need, when you need it.  We recommend signing up for the patient portal called "MyChart".  Sign up information is provided on this After Visit Summary.  MyChart is used to connect with patients for Virtual Visits (Telemedicine).  Patients are able to view lab/test results, encounter notes, upcoming appointments, etc.  Non-urgent messages can be sent to your provider as well.   To learn more about what you can do with MyChart, go to [email or web address].    Your next appointment:   1 year(s)  The format for your next appointment:   In Person  Provider:   Minus Breeding, MD{  Thank you for choosing [facility]!!

## 2022-01-14 ENCOUNTER — Other Ambulatory Visit: Payer: Self-pay | Admitting: Cardiology

## 2022-01-16 ENCOUNTER — Encounter (HOSPITAL_COMMUNITY): Payer: Medicare HMO

## 2022-01-16 ENCOUNTER — Ambulatory Visit (HOSPITAL_COMMUNITY): Payer: Medicare HMO

## 2022-02-10 ENCOUNTER — Observation Stay (HOSPITAL_COMMUNITY)
Admission: EM | Admit: 2022-02-10 | Discharge: 2022-02-12 | Disposition: A | Discharge status: Home / Self Care | Payer: Medicare HMO | Attending: Internal Medicine | Admitting: Internal Medicine

## 2022-02-10 ENCOUNTER — Encounter (HOSPITAL_COMMUNITY): Payer: Self-pay | Admitting: Emergency Medicine

## 2022-02-10 DIAGNOSIS — E11649 Type 2 diabetes mellitus with hypoglycemia without coma: Secondary | ICD-10-CM | POA: Insufficient documentation

## 2022-02-10 DIAGNOSIS — I452 Bifascicular block: Secondary | ICD-10-CM | POA: Diagnosis not present

## 2022-02-10 DIAGNOSIS — G319 Degenerative disease of nervous system, unspecified: Secondary | ICD-10-CM | POA: Insufficient documentation

## 2022-02-10 DIAGNOSIS — Z8669 Personal history of other diseases of the nervous system and sense organs: Secondary | ICD-10-CM | POA: Insufficient documentation

## 2022-02-10 DIAGNOSIS — E785 Hyperlipidemia, unspecified: Secondary | ICD-10-CM | POA: Diagnosis not present

## 2022-02-10 DIAGNOSIS — R5383 Other fatigue: Secondary | ICD-10-CM | POA: Diagnosis not present

## 2022-02-10 DIAGNOSIS — Z6833 Body mass index (BMI) 33.0-33.9, adult: Secondary | ICD-10-CM | POA: Insufficient documentation

## 2022-02-10 DIAGNOSIS — Z87891 Personal history of nicotine dependence: Secondary | ICD-10-CM | POA: Insufficient documentation

## 2022-02-10 DIAGNOSIS — Z7982 Long term (current) use of aspirin: Secondary | ICD-10-CM | POA: Diagnosis not present

## 2022-02-10 DIAGNOSIS — I6501 Occlusion and stenosis of right vertebral artery: Secondary | ICD-10-CM | POA: Insufficient documentation

## 2022-02-10 DIAGNOSIS — I10 Essential (primary) hypertension: Secondary | ICD-10-CM | POA: Insufficient documentation

## 2022-02-10 DIAGNOSIS — Z20822 Contact with and (suspected) exposure to covid-19: Secondary | ICD-10-CM | POA: Diagnosis not present

## 2022-02-10 DIAGNOSIS — E1140 Type 2 diabetes mellitus with diabetic neuropathy, unspecified: Secondary | ICD-10-CM | POA: Insufficient documentation

## 2022-02-10 DIAGNOSIS — Y92009 Unspecified place in unspecified non-institutional (private) residence as the place of occurrence of the external cause: Secondary | ICD-10-CM

## 2022-02-10 DIAGNOSIS — G9340 Encephalopathy, unspecified: Secondary | ICD-10-CM | POA: Insufficient documentation

## 2022-02-10 DIAGNOSIS — Z7182 Exercise counseling: Secondary | ICD-10-CM | POA: Insufficient documentation

## 2022-02-10 DIAGNOSIS — R42 Dizziness and giddiness: Secondary | ICD-10-CM | POA: Insufficient documentation

## 2022-02-10 DIAGNOSIS — E669 Obesity, unspecified: Secondary | ICD-10-CM | POA: Insufficient documentation

## 2022-02-10 DIAGNOSIS — R531 Weakness: Secondary | ICD-10-CM | POA: Diagnosis not present

## 2022-02-10 DIAGNOSIS — Z9181 History of falling: Secondary | ICD-10-CM | POA: Diagnosis not present

## 2022-02-10 DIAGNOSIS — R296 Repeated falls: Secondary | ICD-10-CM | POA: Diagnosis not present

## 2022-02-10 DIAGNOSIS — R202 Paresthesia of skin: Secondary | ICD-10-CM | POA: Insufficient documentation

## 2022-02-10 DIAGNOSIS — Z955 Presence of coronary angioplasty implant and graft: Secondary | ICD-10-CM | POA: Diagnosis not present

## 2022-02-10 DIAGNOSIS — Z713 Dietary counseling and surveillance: Secondary | ICD-10-CM | POA: Insufficient documentation

## 2022-02-10 DIAGNOSIS — R519 Headache, unspecified: Secondary | ICD-10-CM | POA: Insufficient documentation

## 2022-02-10 DIAGNOSIS — G959 Disease of spinal cord, unspecified: Secondary | ICD-10-CM | POA: Insufficient documentation

## 2022-02-10 DIAGNOSIS — Z8249 Family history of ischemic heart disease and other diseases of the circulatory system: Secondary | ICD-10-CM | POA: Insufficient documentation

## 2022-02-10 DIAGNOSIS — Z859 Personal history of malignant neoplasm, unspecified: Secondary | ICD-10-CM | POA: Insufficient documentation

## 2022-02-10 DIAGNOSIS — R29818 Other symptoms and signs involving the nervous system: Secondary | ICD-10-CM | POA: Insufficient documentation

## 2022-02-10 DIAGNOSIS — R2689 Other abnormalities of gait and mobility: Secondary | ICD-10-CM | POA: Insufficient documentation

## 2022-02-10 DIAGNOSIS — Z7984 Long term (current) use of oral hypoglycemic drugs: Secondary | ICD-10-CM | POA: Diagnosis not present

## 2022-02-10 DIAGNOSIS — M109 Gout, unspecified: Secondary | ICD-10-CM | POA: Insufficient documentation

## 2022-02-10 DIAGNOSIS — K9289 Other specified diseases of the digestive system: Secondary | ICD-10-CM | POA: Insufficient documentation

## 2022-02-10 DIAGNOSIS — Y939 Activity, unspecified: Secondary | ICD-10-CM | POA: Diagnosis not present

## 2022-02-10 DIAGNOSIS — W19XXXA Unspecified fall, initial encounter: Secondary | ICD-10-CM | POA: Diagnosis not present

## 2022-02-10 DIAGNOSIS — Z6831 Body mass index (BMI) 31.0-31.9, adult: Secondary | ICD-10-CM | POA: Insufficient documentation

## 2022-02-10 DIAGNOSIS — I2510 Atherosclerotic heart disease of native coronary artery without angina pectoris: Secondary | ICD-10-CM | POA: Diagnosis not present

## 2022-02-10 DIAGNOSIS — Y92099 Unspecified place in other non-institutional residence as the place of occurrence of the external cause: Secondary | ICD-10-CM | POA: Insufficient documentation

## 2022-02-10 DIAGNOSIS — R1310 Dysphagia, unspecified: Secondary | ICD-10-CM | POA: Insufficient documentation

## 2022-02-10 DIAGNOSIS — Z87442 Personal history of urinary calculi: Secondary | ICD-10-CM | POA: Insufficient documentation

## 2022-02-10 DIAGNOSIS — Z981 Arthrodesis status: Secondary | ICD-10-CM | POA: Insufficient documentation

## 2022-02-10 DIAGNOSIS — Z79899 Other long term (current) drug therapy: Secondary | ICD-10-CM | POA: Diagnosis not present

## 2022-02-10 DIAGNOSIS — R2681 Unsteadiness on feet: Secondary | ICD-10-CM | POA: Insufficient documentation

## 2022-02-10 NOTE — ED Triage Notes (Signed)
Pt c/o multiple falls due to increased weakness. Pt also c/o headache and dizziness.  ? ?Hx of vertigo and multiple neck surgeries that caused nerve damage. ?

## 2022-02-10 NOTE — ED Notes (Signed)
ED Provider at bedside. 

## 2022-02-11 ENCOUNTER — Emergency Department (HOSPITAL_COMMUNITY): Payer: Medicare HMO

## 2022-02-11 ENCOUNTER — Observation Stay (HOSPITAL_COMMUNITY): Payer: Medicare HMO

## 2022-02-11 ENCOUNTER — Other Ambulatory Visit: Payer: Self-pay

## 2022-02-11 DIAGNOSIS — I2510 Atherosclerotic heart disease of native coronary artery without angina pectoris: Secondary | ICD-10-CM

## 2022-02-11 DIAGNOSIS — G959 Disease of spinal cord, unspecified: Secondary | ICD-10-CM

## 2022-02-11 DIAGNOSIS — R531 Weakness: Secondary | ICD-10-CM

## 2022-02-11 DIAGNOSIS — Z9861 Coronary angioplasty status: Secondary | ICD-10-CM

## 2022-02-11 DIAGNOSIS — E7800 Pure hypercholesterolemia, unspecified: Secondary | ICD-10-CM

## 2022-02-11 DIAGNOSIS — E1140 Type 2 diabetes mellitus with diabetic neuropathy, unspecified: Secondary | ICD-10-CM

## 2022-02-11 DIAGNOSIS — I10 Essential (primary) hypertension: Secondary | ICD-10-CM

## 2022-02-11 DIAGNOSIS — Y92009 Unspecified place in unspecified non-institutional (private) residence as the place of occurrence of the external cause: Secondary | ICD-10-CM

## 2022-02-11 DIAGNOSIS — W19XXXA Unspecified fall, initial encounter: Secondary | ICD-10-CM

## 2022-02-11 LAB — CBC
HCT: 40.5 % (ref 39.0–52.0)
Hemoglobin: 13 g/dL (ref 13.0–17.0)
MCH: 28.7 pg (ref 26.0–34.0)
MCHC: 32.1 g/dL (ref 30.0–36.0)
MCV: 89.4 fL (ref 80.0–100.0)
Platelets: 276 10*3/uL (ref 150–400)
RBC: 4.53 MIL/uL (ref 4.22–5.81)
RDW: 13.1 % (ref 11.5–15.5)
WBC: 7.4 10*3/uL (ref 4.0–10.5)
nRBC: 0 % (ref 0.0–0.2)

## 2022-02-11 LAB — HEPATIC FUNCTION PANEL
ALT: 20 U/L (ref 0–44)
AST: 21 U/L (ref 15–41)
Albumin: 4.1 g/dL (ref 3.5–5.0)
Alkaline Phosphatase: 42 U/L (ref 38–126)
Bilirubin, Direct: <0.1 mg/dL (ref 0.0–0.2)
Total Bilirubin: 0.6 mg/dL (ref 0.3–1.2)
Total Protein: 7.2 g/dL (ref 6.5–8.1)

## 2022-02-11 LAB — URINALYSIS, ROUTINE W REFLEX MICROSCOPIC
Bacteria, UA: NONE SEEN
Bilirubin Urine: NEGATIVE
Glucose, UA: NEGATIVE mg/dL
Hgb urine dipstick: NEGATIVE
Ketones, ur: 5 mg/dL — AB
Leukocytes,Ua: NEGATIVE
Nitrite: NEGATIVE
Protein, ur: 100 mg/dL — AB
Specific Gravity, Urine: 1.019 (ref 1.005–1.030)
pH: 6 (ref 5.0–8.0)

## 2022-02-11 LAB — BASIC METABOLIC PANEL
Anion gap: 12 (ref 5–15)
BUN: 20 mg/dL (ref 8–23)
CO2: 23 mmol/L (ref 22–32)
Calcium: 9 mg/dL (ref 8.9–10.3)
Chloride: 102 mmol/L (ref 98–111)
Creatinine, Ser: 0.74 mg/dL (ref 0.61–1.24)
GFR, Estimated: >60 mL/min (ref >60)
Glucose, Bld: 154 mg/dL — ABNORMAL HIGH (ref 70–99)
Potassium: 4.1 mmol/L (ref 3.5–5.1)
Sodium: 137 mmol/L (ref 135–145)

## 2022-02-11 LAB — DIFFERENTIAL
Abs Immature Granulocytes: 0.05 10*3/uL (ref 0.00–0.07)
Basophils Absolute: 0 10*3/uL (ref 0.0–0.1)
Basophils Relative: 0 %
Eosinophils Absolute: 0.1 10*3/uL (ref 0.0–0.5)
Eosinophils Relative: 2 %
Immature Granulocytes: 1 %
Lymphocytes Relative: 32 %
Lymphs Abs: 2.3 10*3/uL (ref 0.7–4.0)
Monocytes Absolute: 0.6 10*3/uL (ref 0.1–1.0)
Monocytes Relative: 9 %
Neutro Abs: 4.2 10*3/uL (ref 1.7–7.7)
Neutrophils Relative %: 56 %

## 2022-02-11 LAB — RAPID URINE DRUG SCREEN, HOSP PERFORMED
Amphetamines: NOT DETECTED
Barbiturates: NOT DETECTED
Benzodiazepines: NOT DETECTED
Cocaine: NOT DETECTED
Opiates: NOT DETECTED
Tetrahydrocannabinol: NOT DETECTED

## 2022-02-11 LAB — FOLATE: Folate: 34.9 ng/mL (ref >5.9)

## 2022-02-11 LAB — MAGNESIUM: Magnesium: 1.5 mg/dL — ABNORMAL LOW (ref 1.7–2.4)

## 2022-02-11 LAB — VITAMIN B12: Vitamin B-12: 324 pg/mL (ref 180–914)

## 2022-02-11 LAB — PROTIME-INR
INR: 1 (ref 0.8–1.2)
Prothrombin Time: 13.4 seconds (ref 11.4–15.2)

## 2022-02-11 LAB — LIPID PANEL
Cholesterol: 106 mg/dL (ref 0–200)
HDL: 31 mg/dL — ABNORMAL LOW (ref >40)
LDL Cholesterol: 44 mg/dL (ref 0–99)
Total CHOL/HDL Ratio: 3.4 RATIO
Triglycerides: 157 mg/dL — ABNORMAL HIGH (ref <150)
VLDL: 31 mg/dL (ref 0–40)

## 2022-02-11 LAB — ETHANOL: Alcohol, Ethyl (B): <10 mg/dL (ref <10)

## 2022-02-11 LAB — RESP PANEL BY RT-PCR (FLU A&B, COVID) ARPGX2
Influenza A by PCR: NEGATIVE
Influenza B by PCR: NEGATIVE
SARS Coronavirus 2 by RT PCR: NEGATIVE

## 2022-02-11 LAB — HEMOGLOBIN A1C
Hgb A1c MFr Bld: 7 % — ABNORMAL HIGH (ref 4.8–5.6)
Mean Plasma Glucose: 154 mg/dL

## 2022-02-11 LAB — APTT: aPTT: 32 seconds (ref 24–36)

## 2022-02-11 LAB — GLUCOSE, CAPILLARY
Glucose-Capillary: 120 mg/dL — ABNORMAL HIGH (ref 70–99)
Glucose-Capillary: 145 mg/dL — ABNORMAL HIGH (ref 70–99)
Glucose-Capillary: 144 mg/dL — ABNORMAL HIGH (ref 70–99)
Glucose-Capillary: 138 mg/dL — ABNORMAL HIGH (ref 70–99)

## 2022-02-11 LAB — VITAMIN D 25 HYDROXY (VIT D DEFICIENCY, FRACTURES): Vit D, 25-Hydroxy: 41.57 ng/mL (ref 30–100)

## 2022-02-11 LAB — TSH: TSH: 1.972 u[IU]/mL (ref 0.350–4.500)

## 2022-02-11 MED ORDER — ACETAMINOPHEN 650 MG RE SUPP: 650.0000 mg | RECTAL | Status: DC | PRN

## 2022-02-11 MED ORDER — PANTOPRAZOLE SODIUM 40 MG PO TBEC
40.0000 mg | DELAYED_RELEASE_TABLET | Freq: Every day | ORAL | Status: DC
  Administered 2022-02-11 – 2022-02-12 (×2): 40 mg via ORAL
  Filled 2022-02-11 (×2): qty 1

## 2022-02-11 MED ORDER — MECLIZINE HCL 12.5 MG PO TABS: 25.0000 mg | ORAL_TABLET | Freq: Three times a day (TID) | ORAL | Status: DC | PRN

## 2022-02-11 MED ORDER — B COMPLEX-C PO TABS
1.0000 | ORAL_TABLET | Freq: Every day | ORAL | Status: DC
  Administered 2022-02-11 – 2022-02-12 (×2): 1 via ORAL
  Filled 2022-02-11 (×2): qty 1

## 2022-02-11 MED ORDER — MAGNESIUM SULFATE IN D5W 1-5 GM/100ML-% IV SOLN
1.0000 g | Freq: Once | INTRAVENOUS | Status: AC
  Administered 2022-02-11: 1 g via INTRAVENOUS
  Filled 2022-02-11: qty 100

## 2022-02-11 MED ORDER — TAMSULOSIN HCL 0.4 MG PO CAPS
0.4000 mg | ORAL_CAPSULE | Freq: Every day | ORAL | Status: DC
  Filled 2022-02-11 (×2): qty 1

## 2022-02-11 MED ORDER — ACETAMINOPHEN 325 MG PO TABS: 650.0000 mg | ORAL_TABLET | ORAL | Status: DC | PRN

## 2022-02-11 MED ORDER — B COMPLEX PO TABS: 1.0000 | ORAL_TABLET | Freq: Every day | ORAL | Status: DC

## 2022-02-11 MED ORDER — INSULIN ASPART 100 UNIT/ML IJ SOLN: 0.0000 [IU] | Freq: Every day | INTRAMUSCULAR | Status: DC

## 2022-02-11 MED ORDER — SODIUM CHLORIDE 0.9 % IV BOLUS
500.0000 mL | Freq: Once | INTRAVENOUS | Status: AC
Start: 2022-02-11 — End: 2022-02-11
  Administered 2022-02-11: 500 mL via INTRAVENOUS

## 2022-02-11 MED ORDER — METOPROLOL SUCCINATE ER 50 MG PO TB24
50.0000 mg | ORAL_TABLET | Freq: Every day | ORAL | Status: DC
  Administered 2022-02-11 – 2022-02-12 (×2): 50 mg via ORAL
  Filled 2022-02-11 (×2): qty 1

## 2022-02-11 MED ORDER — ONDANSETRON HCL 4 MG/2ML IJ SOLN
4.0000 mg | Freq: Four times a day (QID) | INTRAMUSCULAR | Status: DC | PRN
Start: 2022-02-11 — End: 2022-02-12

## 2022-02-11 MED ORDER — STROKE: EARLY STAGES OF RECOVERY BOOK: Freq: Once | Status: AC

## 2022-02-11 MED ORDER — GABAPENTIN 300 MG PO CAPS
300.0000 mg | ORAL_CAPSULE | Freq: Two times a day (BID) | ORAL | Status: DC
  Administered 2022-02-11 – 2022-02-12 (×3): 300 mg via ORAL
  Filled 2022-02-11 (×3): qty 1

## 2022-02-11 MED ORDER — ACETAMINOPHEN 160 MG/5ML PO SOLN: 650.0000 mg | ORAL | Status: DC | PRN

## 2022-02-11 MED ORDER — HEPARIN SODIUM (PORCINE) 5000 UNIT/ML IJ SOLN
5000.0000 [IU] | Freq: Three times a day (TID) | INTRAMUSCULAR | Status: DC
  Administered 2022-02-11 – 2022-02-12 (×3): 5000 [IU] via SUBCUTANEOUS
  Filled 2022-02-11 (×3): qty 1

## 2022-02-11 MED ORDER — LISINOPRIL 10 MG PO TABS
10.0000 mg | ORAL_TABLET | Freq: Every day | ORAL | Status: DC
  Administered 2022-02-11 – 2022-02-12 (×2): 10 mg via ORAL
  Filled 2022-02-11 (×2): qty 1

## 2022-02-11 MED ORDER — ASPIRIN EC 81 MG PO TBEC
81.0000 mg | DELAYED_RELEASE_TABLET | Freq: Every day | ORAL | Status: DC
  Administered 2022-02-11 – 2022-02-12 (×2): 81 mg via ORAL
  Filled 2022-02-11 (×2): qty 1

## 2022-02-11 MED ORDER — ATORVASTATIN CALCIUM 20 MG PO TABS
20.0000 mg | ORAL_TABLET | Freq: Every day | ORAL | Status: DC
  Administered 2022-02-11 – 2022-02-12 (×2): 20 mg via ORAL
  Filled 2022-02-11 (×2): qty 1

## 2022-02-11 MED ORDER — INSULIN ASPART 100 UNIT/ML IJ SOLN
0.0000 [IU] | Freq: Three times a day (TID) | INTRAMUSCULAR | Status: DC
  Administered 2022-02-11 (×2): 2 [IU] via SUBCUTANEOUS
  Administered 2022-02-12: 3 [IU] via SUBCUTANEOUS
  Administered 2022-02-12: 2 [IU] via SUBCUTANEOUS

## 2022-02-11 MED ORDER — SENNOSIDES-DOCUSATE SODIUM 8.6-50 MG PO TABS: 1.0000 | ORAL_TABLET | Freq: Every evening | ORAL | Status: DC | PRN

## 2022-02-11 NOTE — TOC Initial Note (Signed)
Transition of Care (TOC) - Initial/Assessment Note  ? ? ?Patient Details  ?Name: [name] [name]. ?MRN: [id number] ?Date of Birth: 21-May-1946 ? ?Transition of Care (TOC) CM/SW Contact:    ?Shade Flood, LCSW ?Phone Number: ?02/11/2022, 11:03 AM ? ?Clinical Narrative:                 ? ?Pt admitted observation status from home. PT recommending HH PT at dc. Spoke with pt today to assess. Pt lives with his wife and plans to return home at dc. He states that he uses a cane for ambulation and is independent in ADLs. Discussed PT HH recommendation with pt who stated that he has been doing outpatient PT for six months following a surgical procedure and that it has not helped him at dc. He believes it has made his problem worse. Pt at this time is not interested in [facility] referral. Pt aware that if he changes his mind or any other DC needs arise, TOC will reach back out. ? ?Expected Discharge Plan: Home/Self Care ?Barriers to Discharge: Continued Medical Work up ? ? ?Patient Goals and CMS Choice ?Patient states their goals for this hospitalization and ongoing recovery are:: go home ?  ?  ? ?Expected Discharge Plan and Services ?Expected Discharge Plan: Home/Self Care ?In-house Referral: Clinical Social Work ?  ?  ?Living arrangements for the past 2 months: [place] ?                ?  ?  ?  ?  ?  ?  ?  ?  ?  ?  ? ?Prior Living Arrangements/Services ?Living arrangements for the past 2 months: [place] ?Lives with:: Spouse ?Patient language and need for interpreter reviewed:: Yes ?Do you feel safe going back to the place where you live?: Yes      ?Need for Family Participation in Patient Care: No (Comment) ?Care giver support system in place?: Yes (comment) ?Current home services: DME ?Criminal Activity/Legal Involvement Pertinent to Current Situation/Hospitalization: No - Comment as needed ? ?Activities of Daily Living ?Home Assistive Devices/Equipment: Cane (specify quad or straight) ?ADL Screening  (condition at time of admission) ?Patient's cognitive ability adequate to safely complete daily activities?: Yes ?Is the patient deaf or have difficulty hearing?: No ?Does the patient have difficulty seeing, even when wearing glasses/contacts?: No ?Does the patient have difficulty concentrating, remembering, or making decisions?: No ?Patient able to express need for assistance with ADLs?: Yes ?Does the patient have difficulty dressing or bathing?: No ?Independently performs ADLs?: Yes (appropriate for developmental age) ?Does the patient have difficulty walking or climbing stairs?: No ?Weakness of Legs: Both ?Weakness of Arms/Hands: Both ? ?Permission Sought/Granted ?  ?  ?   ?   ?   ?   ? ?Emotional Assessment ?  ?Attitude/Demeanor/Rapport: Engaged ?Affect (typically observed): Pleasant ?Orientation: : Oriented to Self, Oriented to Place, Oriented to  Time, Oriented to Situation ?Alcohol / Substance Use: Not Applicable ?Psych Involvement: No (comment) ? ?Admission diagnosis:  Vertigo [R42] ?Encephalopathy [G93.40] ?Generalized weakness [R53.1] ?Patient Active Problem List  ? Diagnosis Date Noted  ? Generalized weakness 02/11/2022  ? Fall at home, initial encounter 02/11/2022  ? Bradycardia 12/26/2020  ? Diabetes mellitus with neuropathy ([place]) 10/04/2020  ? Neurogenic bladder 10/04/2020  ? Tobacco use disorder 10/04/2020  ? Gout 10/04/2020  ? Constipation 10/04/2020  ? 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 Cervical myelopathy ([place]) 10/20/2019  ? SOB (shortness of breath) on exertion 07/22/2013  ?  CAD S/P percutaneous coronary angioplasty 07/09/2013  ? HTN (hypertension) 07/09/2013  ? Hyperlipidemia 07/09/2013  ? Chest pain 07/09/2013  ? ?PCP:  Curly Rim, MD ?Pharmacy:   ?[place], [place] [address] ?Phone: [phone] Fax: [phone] ? ? ? ? ?Social Determinants of Health (SDOH) Interventions ?  ? ?Readmission Risk Interventions ?   ? View :  No data to display.  ?  ?  ?  ? ? ? ?

## 2022-02-11 NOTE — Progress Notes (Signed)
Patient seen and examined.  Admitted after midnight secondary to weakness and strokelike symptoms.  Patient has a prior history of coronary artery disease, diabetes, hyperlipidemia, hypertension and cervical myelopathy with residual right-sided weakness.  Symptoms has been present for the last 10-12 hours or so; last time seen normal in the morning of 02/10/2022.  Reports associated dizziness and feeling his legs just like stronger and not responding and failing on him.  CT scan demonstrated no acute intracranial abnormalities.  Patient expressed generalized weakness and no focal deficits.  Stable vital signs.  Please refer to H&P written by Dr. Clearence Ped for further info/details on admission.  ? ?Plan: ?-Completed stroke work-up ?-Replete electrolytes ?-Follow evaluation by PT/OT ?-Check TSH and B12. ?-Continue supportive care. ? ?Barton Dubois MD ?[phone] ? ?

## 2022-02-11 NOTE — Plan of Care (Signed)
?  Problem: Acute Rehab PT Goals(only PT should resolve) ?Goal: Pt Will Go Supine/Side To Sit ?Outcome: Progressing ?Flowsheets (Taken 02/11/2022 1356) ?Pt will go Supine/Side to Sit: ? Independently ? with modified independence ?Goal: Patient Will Transfer Sit To/From Stand ?Outcome: Progressing ?Flowsheets (Taken 02/11/2022 1356) ?Patient will transfer sit to/from stand: ? with supervision ? with modified independence ?Goal: Pt Will Transfer Bed To Chair/Chair To Bed ?Outcome: Progressing ?Flowsheets (Taken 02/11/2022 1356) ?Pt will Transfer Bed to Chair/Chair to Bed: ? with supervision ? with modified independence ?Goal: Pt Will Ambulate ?Outcome: Progressing ?Flowsheets (Taken 02/11/2022 1356) ?Pt will Ambulate: ? 75 feet ? with supervision ? with rolling walker ? ?1:57 PM, 02/11/22 ?Lonell Grandchild, MPT ?Physical Therapist with [place] ?[facility] ?[phone] office ?[phone] mobile phone ? ?  ?

## 2022-02-11 NOTE — Evaluation (Signed)
Occupational Therapy Evaluation ?Patient Details ?Name: [name] [name]. ?MRN: [id number] ?DOB: 06-11-1946 ?Today's Date: 02/11/2022 ? ? ?History of Present Illness Nitish Roes. is a 76 y.o. male with medical history significant of coronary artery disease, diabetes, hyperlipidemia, hypertension, gout, kidney stones, cervical myelopathy, and more presents the ED with a chief complaint of dizziness and weakness.  Patient does have a history of vertigo for which she is prescribed as needed meclizine patient reports he did not take the meclizine today.  He reports that he is just felt like he could not get any balance.  It was sudden in onset.  ? ?Clinical Impression ?  ?Patient in bed upon therapy arrival and agreeable to participate in OT evaluation. Patient presents with mild deficits related to basic ADL tasks. He demonstrates greater difficulty with ambulation and transfers. Right side weakness is present although his baseline. No follow up OT services were recommended   ?   ? ?Recommendations for follow up therapy are one component of a multi-disciplinary discharge planning process, led by the attending physician.  Recommendations may be updated based on patient status, additional functional criteria and insurance authorization.  ? ?Follow Up Recommendations ? No OT follow up  ?  ?Assistance Recommended at Discharge PRN  ?Patient can return home with the following A little help with walking and/or transfers;A little help with bathing/dressing/bathroom ? ?  ?Functional Status Assessment ? Patient has not had a recent decline in their functional status  ?Equipment Recommendations ? None recommended by OT  ?  ?   ?Precautions / Restrictions Precautions ?Precautions: Fall ?Restrictions ?Weight Bearing Restrictions: No  ? ?  ? ?Mobility Bed Mobility ?Overal bed mobility: Modified Independent ?  ?  ?  ?  ?  ?  ?General bed mobility comments: slow transition ?  ? ?Transfers ?Overall transfer level: Needs  assistance ?Equipment used: Rolling walker (2 wheels) ?Transfers: Sit to/from Stand, Bed to chair/wheelchair/BSC ?Sit to Stand: Mod assist ?Stand pivot transfers: Min assist ?  ?  ?  ?  ?  ?  ? ?  ?Balance Overall balance assessment: Mild deficits observed, not formally tested ?  ?  ?   ? ?ADL either performed or assessed with clinical judgement  ? ?ADL Overall ADL's : Needs assistance/impaired ?  ?  ?Grooming: Wash/dry hands;Wash/dry face;Set up;Sitting ?  ?Upper Body Bathing: Set up;Sitting ?  ?Lower Body Bathing: Minimal assistance;Sitting/lateral leans ?  ?Upper Body Dressing : Set up;Sitting ?  ?Lower Body Dressing: Minimal assistance;Sitting/lateral leans ?  ?Toilet Transfer: Minimal assistance;BSC/3in1 ?  ?   ? ? ? ?Vision Baseline Vision/History: 1 Wears glasses ?Ability to See in Adequate Light: 0 Adequate ?Patient Visual Report: No change from baseline ?Vision Assessment?: No apparent visual deficits  ?   ?Perception Perception ?Perception: Within Functional Limits ?  ?Praxis Praxis ?Praxis: Intact ?  ? ?Pertinent Vitals/Pain Pain Assessment ?Pain Assessment: 0-10 ?Pain Score: 5  ?Pain Location: right side - arm and leg. Pt report this has been ongoing ?Pain Descriptors / Indicators: Sore ?Pain Intervention(s): Limited activity within patient's tolerance, Monitored during session  ? ? ? ?Hand Dominance Right ?  ?Extremity/Trunk Assessment Upper Extremity Assessment ?Upper Extremity Assessment: RUE deficits/detail (LUE shoulder, elbow, and hand strength are all WFL in all ranges.) ?RUE Deficits / Details: MMT shoulder flexion, 4-/5, abduction, 4/5, IR: 4-/5, er: 4/5. Elbow flexion/extension: 4/5. Decreased gross grasp ?  ?  ?  ?  ?  ?Communication Communication ?Communication: No difficulties ?  ?  Cognition Arousal/Alertness: Awake/alert ?Behavior During Therapy: [facility] for tasks assessed/performed ?Overall Cognitive Status: Within Functional Limits for tasks assessed ?  ?  ?  ?  ?  ?   ?   ?   ? ? ?Home Living  Family/patient expects to be discharged to:: Private residence ?Living Arrangements: Spouse/significant other ?  ?Type of Home: House ?Home Access: Ramped entrance;Level entry ?  ?  ?Home Layout: One level ?  ?  ?Bathroom Shower/Tub: Walk-in shower ?  ?Bathroom Toilet: Standard ?  ?  ?Home Equipment: Cane - quad;Shower seat;BSC/3in1;Rolling Environmental consultant (2 wheels) ?  ?Additional Comments: Pt reports neuropathy in bilateral feet. ?  ? ?  ?Prior Functioning/Environment Prior Level of Function : Independent/Modified Independent ?  ?  ?  ? ?  ?  ?OT Problem List: Decreased strength ?  ?   ?   ?   ?   ? ?Co-evaluation PT/OT/SLP Co-Evaluation/Treatment: Yes ?Reason for Co-Treatment: To address functional/ADL transfers ?  ?OT goals addressed during session: ADL's and self-care;Strengthening/ROM;Proper use of Adaptive equipment and DME ?  ? ?  ?AM-PAC OT "6 Clicks" Daily Activity     ?Outcome Measure Help from another person eating meals?: None ?Help from another person taking care of personal grooming?: None ?Help from another person toileting, which includes using toliet, bedpan, or urinal?: A Little ?Help from another person bathing (including washing, rinsing, drying)?: A Little ?Help from another person to put on and taking off regular upper body clothing?: None ?Help from another person to put on and taking off regular lower body clothing?: A Little ?6 Click Score: 21 ?  ?End of Session Equipment Utilized During Treatment: Gait belt;Rolling walker (2 wheels) ? ?Activity Tolerance: Patient tolerated treatment well ?Patient left: in chair;with call bell/phone within reach ? ?OT Visit Diagnosis: Muscle weakness (generalized) (M62.81);History of falling (Z91.81)  ?              ?Time: 9381-0175 ?OT Time Calculation (min): 21 min ?Charges:  OT General Charges ?$OT Visit: 1 Visit ?OT Evaluation ?$OT Eval Low Complexity: 1 Low ? ?Ailene Ravel, OTR/L,CBIS  ?[phone] ? ? ?Kannon Granderson, Clarene Duke ?02/11/2022, 12:33 PM ?

## 2022-02-11 NOTE — Assessment & Plan Note (Addendum)
-  Resume home hypoglycemic regimen ?-Modified carbohydrate diet encouraged ?-A1c 7.0 ?

## 2022-02-11 NOTE — H&P (Signed)
?History and Physical  ? ? ?Patient: [name] [name]. QJJ:[id number] DOB: [birth year] ?DOA: 02/10/2022 ?DOS: the patient was seen and examined on 02/11/2022 ?PCP: Corrington, Delsa Grana, MD  ?Patient coming from: Home ? ?Chief Complaint:  ?Chief Complaint  ?Patient presents with  ? Weakness  ? ?HPI: [name] [name]. is a 76 y.o. male with medical history significant of coronary artery disease, diabetes, hyperlipidemia, hypertension, gout, kidney stones, cervical myelopathy, and more presents the ED with a chief complaint of dizziness and weakness.  Patient does have a history of vertigo for which she is prescribed as needed meclizine patient reports he did not take the meclizine today.  He reports that he is just felt like he could not get any balance.  It was sudden in onset.  Patient was able to walk at his baseline this morning, and then as the day went on he got weaker and weaker, and the dizziness got worse and worse.  When the dizziness first came on he did not think much of it because its not uncommon for him.  But it was worse than it has ever been per his report today.  He fell twice as well.  He felt like his legs were rubber underneath him.  He has chronic right-sided weakness following a cervical spine surgery that he had, but he is usually able to get around the house.  Patient reports he also has weakness with his swallow.  He chokes on liquids more than solids.  He reports that that is been a chronic issue as well, but may be a little worse right now.  Patient has no other complaints at this time. ? ?Patient does not smoke, does not drink, does not use illicit drugs.  He is vaccinated for COVID.  Patient is full code. ?Review of Systems: As mentioned in the history of present illness. All other systems reviewed and are negative. ?Past Medical History:  ?Diagnosis Date  ? CAD (coronary artery disease)   ? Cancer [facility])   ? Diabetes ([place])   ? Dyslipidemia   ? Gout   ? History of kidney stones   ? HTN  (hypertension)   ? Myocardial infarction [facility])   ? ?Past Surgical History:  ?Procedure Laterality Date  ? ANTERIOR CERVICAL DECOMP/DISCECTOMY FUSION Left 10/20/2019  ? Procedure: Left Cervical Three-Four Cervical Four-Five Cervical Five-Six Anterior cervical decompression/discectomy/fusion;  Surgeon: Judith Part, MD;  Location: [facility];  Service: Neurosurgery;  Laterality: Left;  Left Cervical Three-Four Cervical Four-Five Cervical Five-Six Anterior cervical decompression/discectomy/fusion  ? BACK SURGERY    ? left knee surgery    ? left shoulder surgery    ? POSTERIOR CERVICAL FUSION/FORAMINOTOMY N/A 09/17/2020  ? Procedure: CERVICAL THREE TO CERVICAL SIX POSTERIOR CERVICAL DECOMPRESSION;  Surgeon: Judith Part, MD;  Location: [facility];  Service: Neurosurgery;  Laterality: N/A;  ? right knee surgery    ? ?Social History:  reports that he quit smoking about 33 years ago. His smoking use included cigarettes. He started smoking about 69 years ago. He has a 180.00 pack-year smoking history. He has never been exposed to tobacco smoke. His smokeless tobacco use includes snuff. He reports that he does not drink alcohol and does not use drugs. ? ?Allergies  ?Allergen Reactions  ? Simvastatin Other (See Comments)  ?  dizziness  ? ? ?Family History  ?Problem Relation Age of Onset  ? Heart attack Father 52  ? Colon cancer Brother 46  ? ? ?Prior to Admission  medications   ?Medication Sig Start Date End Date Taking? Authorizing Provider  ?acetaminophen (TYLENOL) 325 MG tablet Take 1-2 tablets (325-650 mg total) by mouth every 4 (four) hours as needed for mild pain. 10/03/20   Love, Ivan Anchors, PA-C  ?aspirin EC 81 MG tablet Take 1 tablet (81 mg total) by mouth daily. Swallow whole. 12/27/20   Minus Breeding, MD  ?atorvastatin (LIPITOR) 20 MG tablet Take 1 tablet by mouth daily. 07/06/20   [provider]  ?b complex vitamins tablet Take 1 tablet by mouth daily.    [provider]  ?colchicine 0.6 MG  tablet Take 0.6 mg by mouth daily as needed (gout flare).    [provider]  ?gabapentin (NEURONTIN) 300 MG capsule Take 2 capsules (600 mg total) by mouth 2 (two) times daily. 10/03/20 01/02/22  Bary Leriche, PA-C  ?LIFESCAN FINEPOINT LANCETS MISC Use to check blood sugar 3 time(s) daily 10/06/17   [provider]  ?metFORMIN (GLUCOPHAGE) 850 MG tablet Take 1 tablet (850 mg total) by mouth 2 (two) times daily with a meal. 10/03/20   Love, Ivan Anchors, PA-C  ?metoprolol succinate (TOPROL-XL) 50 MG 24 hr tablet TAKE 1 TABLET BY MOUTH ONCE DAILY WITH OR IMMEDIATELY FOLLOWING A MEAL 01/14/22   Minus Breeding, MD  ?Omega-3 Fatty Acids (FISH OIL) 1000 MG CAPS Take 1,000 mg by mouth 2 (two) times daily.     [provider]  ?omeprazole (PRILOSEC) 20 MG capsule Take 20 mg by mouth daily.  10/02/16   [provider]  ? ? ?Physical Exam: ?Vitals:  ? 02/11/22 0100 02/11/22 0200 02/11/22 0300 02/11/22 0400  ?BP: 127/65 137/66 (!) 137/99 140/70  ?Pulse: 65 66 62 65  ?Resp: '12 12 14 14  '$ ?Temp:    97.8 ?F (36.6 ?C)  ?TempSrc:    Oral  ?SpO2: 95% 96% 98% 97%  ?Weight:      ?Height:      ? ?1.  General: ?Patient lying supine in bed,  no acute distress ?  ?2. Psychiatric: ?Alert and oriented x 3, mood and behavior normal for situation, pleasant and cooperative with exam ?  ?3. Neurologic: ?Speech and language are normal, face is symmetric, moves all 4 extremities voluntarily, right sided weakness, normal strength in upper and lower left extremities ?  ?4. HEENMT:  ?Head is atraumatic, normocephalic, pupils reactive to light, neck is supple, trachea is midline, mucous membranes are moist ?  ?5. Respiratory : ?Lungs are clear to auscultation bilaterally without wheezing, rhonchi, rales, no cyanosis, no increase in work of breathing or accessory muscle use ?  ?6. Cardiovascular : ?Heart rate normal, rhythm is regular, no murmurs, rubs or gallops, no peripheral edema, peripheral pulses palpated ?  ?7.  Gastrointestinal:  ?Abdomen is soft, nondistended, nontender to palpation bowel sounds active, no masses or organomegaly palpated ?  ?8. Skin:  ?Skin is warm, dry and intact without rashes, acute lesions, or ulcers on limited exam ?  ?9.Musculoskeletal:  ?No acute deformities or trauma, no asymmetry in tone, no peripheral edema, peripheral pulses palpated, no tenderness to palpation in the extremities ? ?Data Reviewed: ?In the ED ?Temp 97.7, heart rate 62-71, respiratory rate 11-14, blood pressure 137/99, satting at 98% ?No leukocytosis White blood cell count of 7.4, hemoglobin 13.0, platelets 276 ?Chemistry panel is unremarkable aside from a mild hyperglycemia at 154 ?Negative respiratory panel ?UA is not indicative of UTI, UDS is negative ?CT head shows no acute findings ?EKG shows sinus rhythm,  QTc 454 with a right bundle branch block ?Normal saline bolus 500 mL given in the ED ?Admission requested for acute on chronic weakness ?In the ED they did start stroke protocol, although there are no acute focal deficits ? ?Assessment and Plan: ?* Generalized weakness ?-No acute deficits on exam ?-Patient has chronic left sided weakness from Cervical myelopathy ?-Check for nutritional deficiencies ?-Check TSH ?-Known vertebral artery occlusion ?-Check MRI for any acute stroke - holding off on full CVA workup until MRI results ?-CT shows no acute changes ?-Patient has total weakness, including swallow  ?-PT/ST/OT eval and treat ?-NPO for now ?-UDS negative ?-No infectious symptoms, no leukocytosis ?-Continue to monitor ? ?Fall at home, initial encounter ?-2/2 weakness ?-CT head without acute changes ?-UDS neg ?-See plan for generalized weakness ? ?Diabetes mellitus with neuropathy ([place]) ?-Hold metformin ?-Sliding scale coverage ?-Hgb A1C in the AM ?-Continue to monitor ? ?Hyperlipidemia ?Continue statin ?Check lipid panel in the AM ? ? ?HTN (hypertension) ?Continue metoprolol ?Continue to monitor ? ?CAD S/P percutaneous  coronary angioplasty ?Continue asa, statin, and beta blocker ? ?Cervical myelopathy ([facility])-resolved as of 02/11/2022 ?-With chronic left sided weakness ? ? ? ? ? ? Advance Care Planning:   Code Status: Prior full ? ?Con

## 2022-02-11 NOTE — ED Notes (Signed)
ED Provider at bedside. 

## 2022-02-11 NOTE — Assessment & Plan Note (Addendum)
-  Continue statin ?-Heart healthy diet encouraged. ? ?

## 2022-02-11 NOTE — Assessment & Plan Note (Addendum)
-  Continue asa, statin, lisinopril, and beta blocker ?-Patient denies chest pain or shortness of breath currently. ?-Continue patient follow-up with cardiology service. ?

## 2022-02-11 NOTE — Assessment & Plan Note (Addendum)
-  No acute deficits on exam ?-Acute stroke has been ruled out with CT scan and MRI ?-Continue secondary precautions ?-B12 borderline low and patient's symptoms consistent with vertigo. ?-As needed meclizine has been prescribed ?-Continue outpatient follow-up with neurology service ?-Patient advised to maintain adequate nutrition and hydration ?-Physical therapy has examined patient and recommendation for home health services provided. ? ?

## 2022-02-11 NOTE — Assessment & Plan Note (Addendum)
-  Continue metoprolol and lisinopril ?-Vital signs has remained stable ?-Advised to follow heart healthy diet. ?-Continue to follow blood pressure and adjust regimen as required. ?

## 2022-02-11 NOTE — ED Provider Notes (Signed)
?[facility] ?Provider Note ? ? ?CSN: [id number] ?Arrival date & time: 02/10/22  2317 ? ?  ? ?History ? ?Chief Complaint  ?Patient presents with  ? Weakness  ? ? ?[name] [name]. is a 76 y.o. male. ? ?Presents to the emergency department for evaluation of generalized weakness.  Patient reports that he woke up this morning and went for his normal walk, but then started to notice severe dizziness.  He does have a history of vertigo.  Patient reports that his legs feel like rubber and he is falling because of weakness.  Family reports that he seems to be more confused than usual.  He has had decreased urine output today. ? ? ? ?  ? ?Home Medications ?Prior to Admission medications   ?Medication Sig Start Date End Date Taking? Authorizing Provider  ?acetaminophen (TYLENOL) 325 MG tablet Take 1-2 tablets (325-650 mg total) by mouth every 4 (four) hours as needed for mild pain. 10/03/20   Love, Ivan Anchors, PA-C  ?aspirin EC 81 MG tablet Take 1 tablet (81 mg total) by mouth daily. Swallow whole. 12/27/20   Minus Breeding, MD  ?atorvastatin (LIPITOR) 20 MG tablet Take 1 tablet by mouth daily. 07/06/20   [provider]  ?b complex vitamins tablet Take 1 tablet by mouth daily.    [provider]  ?colchicine 0.6 MG tablet Take 0.6 mg by mouth daily as needed (gout flare).    [provider]  ?gabapentin (NEURONTIN) 300 MG capsule Take 2 capsules (600 mg total) by mouth 2 (two) times daily. 10/03/20 01/02/22  Bary Leriche, PA-C  ?LIFESCAN FINEPOINT LANCETS MISC Use to check blood sugar 3 time(s) daily 10/06/17   [provider]  ?metFORMIN (GLUCOPHAGE) 850 MG tablet Take 1 tablet (850 mg total) by mouth 2 (two) times daily with a meal. 10/03/20   Love, Ivan Anchors, PA-C  ?metoprolol succinate (TOPROL-XL) 50 MG 24 hr tablet TAKE 1 TABLET BY MOUTH ONCE DAILY WITH OR IMMEDIATELY FOLLOWING A MEAL 01/14/22   Minus Breeding, MD  ?Omega-3 Fatty Acids (FISH OIL) 1000 MG CAPS Take  1,000 mg by mouth 2 (two) times daily.     [provider]  ?omeprazole (PRILOSEC) 20 MG capsule Take 20 mg by mouth daily.  10/02/16   [provider]  ?   ? ?Allergies    ?Simvastatin   ? ?Review of Systems   ?Review of Systems  ?Constitutional:  Positive for fatigue.  ?Neurological:  Positive for dizziness.  ? ?Physical Exam ?Updated Vital Signs ?BP (!) 137/99   Pulse 62   Temp 97.7 ?F (36.5 ?C) (Oral)   Resp 14   Ht '5\' 10"'$  (1.778 m)   Wt 98.9 kg   SpO2 98%   BMI 31.28 kg/m?  ?Physical Exam ?Vitals and nursing note reviewed.  ?Constitutional:   ?   General: He is not in acute distress. ?   Appearance: He is well-developed.  ?HENT:  ?   Head: Normocephalic and atraumatic.  ?   Mouth/Throat:  ?   Mouth: Mucous membranes are moist.  ?Eyes:  ?   General: Vision grossly intact. Gaze aligned appropriately.  ?   Extraocular Movements: Extraocular movements intact.  ?   Conjunctiva/sclera: Conjunctivae normal.  ?Cardiovascular:  ?   Rate and Rhythm: Normal rate and regular rhythm.  ?   Pulses: Normal pulses.  ?   Heart sounds: Normal heart sounds, S1 normal and S2 normal. No murmur heard. ?  No friction rub. No gallop.  ?Pulmonary:  ?   Effort: Pulmonary effort is normal. No respiratory distress.  ?   Breath sounds: Normal breath sounds.  ?Abdominal:  ?   Palpations: Abdomen is soft.  ?   Tenderness: There is no abdominal tenderness. There is no guarding or rebound.  ?   Hernia: No hernia is present.  ?Musculoskeletal:     ?   General: No swelling.  ?   Cervical back: Full passive range of motion without pain, normal range of motion and neck supple. No pain with movement, spinous process tenderness or muscular tenderness. Normal range of motion.  ?   Right lower leg: No edema.  ?   Left lower leg: No edema.  ?Skin: ?   General: Skin is warm and dry.  ?   Capillary Refill: Capillary refill takes less than 2 seconds.  ?   Findings: No ecchymosis, erythema, lesion or wound.  ?Neurological:  ?    Mental Status: He is alert and oriented to person, place, and time.  ?   GCS: GCS eye subscore is 4. GCS verbal subscore is 5. GCS motor subscore is 6.  ?   Cranial Nerves: Cranial nerves 2-12 are intact.  ?   Sensory: Sensation is intact.  ?   Motor: Weakness (Bilateral upper extremity strength 3 out of 5, left lower extremity strength 2+ out of 5, right lower extremity strength 3 out of 5) present. No abnormal muscle tone.  ?   Coordination: Coordination is intact.  ?Psychiatric:     ?   Mood and Affect: Mood normal.     ?   Speech: Speech normal.     ?   Behavior: Behavior normal.  ? ? ?ED Results / Procedures / Treatments   ?Labs ?(all labs ordered are listed, but only abnormal results are displayed) ?Labs Reviewed  ?BASIC METABOLIC PANEL - Abnormal; Notable for the following components:  ?    Result Value  ? Glucose, Bld 154 (*)   ? All other components within normal limits  ?URINALYSIS, ROUTINE W REFLEX MICROSCOPIC - Abnormal; Notable for the following components:  ? APPearance HAZY (*)   ? Ketones, ur 5 (*)   ? Protein, ur 100 (*)   ? All other components within normal limits  ?RESP PANEL BY RT-PCR (FLU A&B, COVID) ARPGX2  ?CBC  ?ETHANOL  ?PROTIME-INR  ?APTT  ?RAPID URINE DRUG SCREEN, HOSP PERFORMED  ?HEPATIC FUNCTION PANEL  ?DIFFERENTIAL  ? ? ?EKG ?EKG Interpretation ? ?Date/Time:  Sunday February 10 2022 23:23:57 EDT ?Ventricular Rate:  69 ?PR Interval:  160 ?QRS Duration: 143 ?QT Interval:  423 ?QTC Calculation: 381 ?R Axis:   -75 ?Text Interpretation: Sinus rhythm RBBB and LAFB Baseline wander in lead(s) V3 No significant change since last tracing Confirmed by Orpah Greek [phone]) on 02/11/2022 12:41:14 AM ? ?Radiology ?CT HEAD WO CONTRAST ? ?Result Date: 02/11/2022 ?CLINICAL DATA:  Urological deficit, acute, stroke suspected. Multiple falls due to increased weakness. Headache and dizziness. EXAM: CT HEAD WITHOUT CONTRAST TECHNIQUE: Contiguous axial images were obtained from the base of the skull  through the vertex without intravenous contrast. RADIATION DOSE REDUCTION: This exam was performed according to the departmental dose-optimization program which includes automated exposure control, adjustment of the mA and/or kV according to patient size and/or use of iterative reconstruction technique. COMPARISON:  CT head 07/15/2020.  MRI brain 02/08/2021 FINDINGS: Brain: No evidence of acute infarction, hemorrhage, hydrocephalus, extra-axial collection or mass lesion/mass  effect. Mild cerebral atrophy. Vascular: Moderate intracranial arterial vascular calcifications. Skull: Calvarium appears intact. Sinuses/Orbits: Paranasal sinuses and mastoid air cells are clear. Other: None. IMPRESSION: No acute intracranial abnormalities.  Mild chronic atrophy. Electronically Signed   By: Lucienne Capers M.D.   On: 02/11/2022 00:33   ? ?Procedures ?Procedures  ? ? ?Medications Ordered in ED ?Medications  ?sodium chloride 0.9 % bolus 500 mL (0 mLs Intravenous Stopped 02/11/22 0318)  ? ? ?ED Course/ Medical Decision Making/ A&P ?  ?                        ?Medical Decision Making ?Amount and/or Complexity of Data Reviewed ?Labs: ordered. ?Radiology: ordered. ? ? ?Complicated medical patient presents with generalized weakness, difficulty ambulating, dizziness. ? ?Differential diagnosis considered is broad, includes central vertigo, peripheral vertigo, acute stroke, TIA, metabolic encephalopathy, UTI. ? ?Patient's records reviewed.  He has had several similar presentations in the past.  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  This has resulted in chronic weakness of his left side as well as some intermittent paresthesias and symptoms on the right side.  This has been evaluated by multiple neurosurgeons after his surgery, no further surgery is recommended. ? 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  Patient has had vascular  surgery consultation at which time it was felt that the chronically occluded vertebral artery was not contributory to his neurologic symptoms. ? ?Patient now experiencing difficulty with ambulation.  Examination reveals poo

## 2022-02-11 NOTE — Evaluation (Signed)
Physical Therapy Evaluation ?Patient Details ?Name: [name] [name]. ?MRN: [id number] ?DOB: 08/07/1946 ?Today's Date: 02/11/2022 ? ?History of Present Illness ? [name] [name]. is a 76 y.o. male with medical history significant of coronary artery disease, diabetes, hyperlipidemia, hypertension, gout, kidney stones, cervical myelopathy, and more presents the ED with a chief complaint of dizziness and weakness.  Patient does have a history of vertigo for which she is prescribed as needed meclizine patient reports he did not take the meclizine today.  He reports that he is just felt like he could not get any balance.  It was sudden in onset. ?  ?Clinical Impression ? Patient has difficulty completing sit to stands using quad-cane due to weakness, required the use of RW for safety demonstrating slow labored cadence with occasional drifting to the right and decreased right ankle dorsiflexion and had to take sitting rest break before returning to room due to fatigue.  Patient tolerated sitting up in chair after therapy and encouraged to have nursing staff supervise when needing to go to bathroom.  Patient will benefit from continued skilled physical therapy in hospital and recommended venue below to increase strength, balance, endurance for safe ADLs and gait.  ?   ?   ? ?Recommendations for follow up therapy are one component of a multi-disciplinary discharge planning process, led by the attending physician.  Recommendations may be updated based on patient status, additional functional criteria and insurance authorization. ? ?Follow Up Recommendations Home health PT ? ?  ?Assistance Recommended at Discharge Set up Supervision/Assistance  ?Patient can return home with the following ? A little help with walking and/or transfers;A little help with bathing/dressing/bathroom;Assistance with cooking/housework;Help with stairs or ramp for entrance ? ?  ?Equipment Recommendations None recommended by PT  ?Recommendations for  Other Services ?    ?  ?Functional Status Assessment Patient has had a recent decline in their functional status and demonstrates the ability to make significant improvements in function in a reasonable and predictable amount of time.  ? ?  ?Precautions / Restrictions Precautions ?Precautions: Fall ?Restrictions ?Weight Bearing Restrictions: No  ? ?  ? ?Mobility ? Bed Mobility ?Overal bed mobility: Modified Independent ?  ?  ?  ?  ?  ?  ?  ?  ? ?Transfers ?Overall transfer level: Needs assistance ?Equipment used: Rolling walker (2 wheels) ?Transfers: Sit to/from Stand, Bed to chair/wheelchair/BSC ?Sit to Stand: Mod assist ?Stand pivot transfers: Min assist ?  ?  ?  ?  ?General transfer comment: had difficulty completing sit to stands using quad-cane due to BLE weakness, required use of RW for safety ?  ? ?Ambulation/Gait ?Ambulation/Gait assistance: Min guard, Min assist ?Gait Distance (Feet): 40 Feet ?Assistive device: Rolling walker (2 wheels) ?Gait Pattern/deviations: Decreased step length - right, Decreased step length - left, Decreased stride length, Decreased dorsiflexion - left, Drifts right/left ?Gait velocity: decreased ?  ?  ?General Gait Details: slow labored with occasional drifting to the right and decreased left ankle dorsiflexion, required sitting rest break before returning to room ? ?Stairs ?  ?  ?  ?  ?  ? ?Wheelchair Mobility ?  ? ?Modified Rankin (Stroke Patients Only) ?  ? ?  ? ?Balance Overall balance assessment: Needs assistance ?Sitting-balance support: Feet supported, No upper extremity supported ?Sitting balance-Leahy Scale: Good ?Sitting balance - Comments: seated at EOB ?  ?Standing balance support: During functional activity, Single extremity supported ?Standing balance-Leahy Scale: Poor ?Standing balance comment: fair/poor using quad cane, fair/good  using RW ?  ?  ?  ?  ?  ?  ?  ?  ?  ?  ?  ?   ? ? ? ?Pertinent Vitals/Pain Pain Assessment ?Pain Assessment: 0-10 ?Pain Score: 5  ?Pain  Location: right side - arm and leg. Pt report this has been ongoing ?Pain Descriptors / Indicators: Sore ?Pain Intervention(s): Limited activity within patient's tolerance, Monitored during session  ? ? ?Home Living Family/patient expects to be discharged to:: Private residence ?Living Arrangements: Spouse/significant other ?Available Help at Discharge: Family;Available 24 hours/day ?Type of Home: House ?Home Access: Ramped entrance;Level entry ?  ?  ?  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  ?  ?Prior Function Prior Level of Function : Independent/Modified Independent ?  ?  ?  ?  ?  ?  ?Mobility Comments: Press photographer, drives ?ADLs Comments: Independent ?  ? ? ?Hand Dominance  ? Dominant Hand: Right ? ?  ?Extremity/Trunk Assessment  ? Upper Extremity Assessment ?Upper Extremity Assessment: Defer to OT evaluation ?RUE Deficits / Details: MMT shoulder flexion, 4-/5, abduction, 4/5, IR: 4-/5, er: 4/5. Elbow flexion/extension: 4/5. Decreased gross grasp ?  ? ?Lower Extremity Assessment ?Lower Extremity Assessment: Generalized weakness ?  ? ?Cervical / Trunk Assessment ?Cervical / Trunk Assessment: Normal  ?Communication  ? Communication: No difficulties  ?Cognition Arousal/Alertness: Awake/alert ?Behavior During Therapy: [facility] for tasks assessed/performed ?Overall Cognitive Status: Within Functional Limits for tasks assessed ?  ?  ?  ?  ?  ?  ?  ?  ?  ?  ?  ?  ?  ?  ?  ?  ?  ?  ?  ? ?  ?General Comments   ? ?  ?Exercises    ? ?Assessment/Plan  ?  ?PT Assessment Patient needs continued PT services  ?PT Problem List Decreased strength;Decreased activity tolerance;Decreased balance;Decreased mobility ? ?   ?  ?PT Treatment Interventions DME instruction;Gait training;Stair training;Functional mobility training;Therapeutic activities;Therapeutic exercise;Patient/family  education;Balance training   ? ?PT Goals (Current goals can be found in the Care Plan section)  ?Acute Rehab PT Goals ?Patient Stated Goal: return home with family to assist ?PT Goal Formulation: With patient ?Time For Goal Achievement: 02/13/22 ?Potential to Achieve Goals: Good ? ?  ?Frequency Min 3X/week ?  ? ? ?Co-evaluation PT/OT/SLP Co-Evaluation/Treatment: Yes ?Reason for Co-Treatment: To address functional/ADL transfers ?PT goals addressed during session: Mobility/safety with mobility;Balance;Proper use of DME ?  ?  ? ? ?  ?AM-PAC PT "6 Clicks" Mobility  ?Outcome Measure Help needed turning from your back to your side while in a flat bed without using bedrails?: None ?Help needed moving from lying on your back to sitting on the side of a flat bed without using bedrails?: None ?Help needed moving to and from a bed to a chair (including a wheelchair)?: A Little ?Help needed standing up from a chair using your arms (e.g., wheelchair or bedside chair)?: A Little ?Help needed to walk in hospital room?: A Little ?Help needed climbing 3-5 steps with a railing? : A Lot ?6 Click Score: 19 ? ?  ?End of Session   ?Activity Tolerance: Patient tolerated treatment well;Patient limited by fatigue ?Patient left: in chair;with call bell/phone within reach ?Nurse Communication: Mobility status ?PT Visit Diagnosis: Unsteadiness on feet (R26.81);Other abnormalities of gait and mobility (R26.89);Muscle weakness (generalized) (M62.81) ?  ? ?Time: 5726-2035 ?PT Time Calculation (min) (ACUTE  ONLY): 30 min ? ? ?Charges:   PT Evaluation ?$PT Eval Moderate Complexity: 1 Mod ?PT Treatments ?$Therapeutic Activity: 23-37 mins ?  ?   ? ? ?1:55 PM, 02/11/22 ?Lonell Grandchild, MPT ?Physical Therapist with [facility] ?[facility] ?[phone] office ?[phone] mobile phone ? ? ?

## 2022-02-11 NOTE — Evaluation (Signed)
Clinical/Bedside Swallow Evaluation ?Patient Details  ?Name: [name] [name]. ?MRN: [id number] ?Date of Birth: 1946-06-11 ? ?Today's Date: 02/11/2022 ?Time: SLP Start Time (ACUTE ONLY): 1430 SLP Stop Time (ACUTE ONLY): 1451 ?SLP Time Calculation (min) (ACUTE ONLY): 21 min ? ?Past Medical History:  ?Past Medical History:  ?Diagnosis Date  ? CAD (coronary artery disease)   ? Cancer [facility])   ? Diabetes ([place])   ? Dyslipidemia   ? Gout   ? History of kidney stones   ? HTN (hypertension)   ? Myocardial infarction [facility])   ? ?Past Surgical History:  ?Past Surgical History:  ?Procedure Laterality Date  ? ANTERIOR CERVICAL DECOMP/DISCECTOMY FUSION Left 10/20/2019  ? Procedure: Left Cervical Three-Four Cervical Four-Five Cervical Five-Six Anterior cervical decompression/discectomy/fusion;  Surgeon: Judith Part, MD;  Location: [place];  Service: Neurosurgery;  Laterality: Left;  Left Cervical Three-Four Cervical Four-Five Cervical Five-Six Anterior cervical decompression/discectomy/fusion  ? BACK SURGERY    ? left knee surgery    ? left shoulder surgery    ? POSTERIOR CERVICAL FUSION/FORAMINOTOMY N/A 09/17/2020  ? Procedure: CERVICAL THREE TO CERVICAL SIX POSTERIOR CERVICAL DECOMPRESSION;  Surgeon: Judith Part, MD;  Location: [place];  Service: Neurosurgery;  Laterality: N/A;  ? right knee surgery    ? ?HPI:  ?[name] [name]. is a 76 y.o. male with medical history significant of coronary artery disease, diabetes, hyperlipidemia, hypertension, gout, kidney stones, cervical myelopathy, and more presents the ED with a chief complaint of dizziness and weakness.  Patient does have a history of vertigo for which she is prescribed as needed meclizine patient reports he did not take the meclizine today.  He reports that he is just felt like he could not get any balance.  It was sudden in onset. MRI and CT negative for acute changes. Pt reports feeling back to baseline. Pt initially failed the [facility] swallow screen and MD  requested. BSE.  ?  ?Assessment / Plan / Recommendation  ?Clinical Impression ? Clinical swallow evaluation completed at bedside. Oral motor examination is WNL with the exception of edentulous status. Pt reports that despite having no teeth, he is able to eat what he wants. Pt self presented all textures and consistencies and shows no overt signs or symptoms of aspiration and no reports of globus. He feels that he has returned to baseline. Recommend regular textures and thin liquids with standard aspiration and reflux precautions. No further SLP services indicated at this time. ?SLP Visit Diagnosis: Dysphagia, unspecified (R13.10) ?   ?Aspiration Risk ? No limitations  ?  ?Diet Recommendation Regular;Thin liquid  ? ?Liquid Administration via: Cup;Straw ?Medication Administration: Whole meds with liquid ?Supervision: Patient able to self feed ?Postural Changes: Seated upright at 90 degrees;Remain upright for at least 30 minutes after po intake  ?  ?Other  Recommendations Oral Care Recommendations: Oral care BID ?Other Recommendations: Clarify dietary restrictions   ? ?Recommendations for follow up therapy are one component of a multi-disciplinary discharge planning process, led by the attending physician.  Recommendations may be updated based on patient status, additional functional criteria and insurance authorization. ? ?Follow up Recommendations No SLP follow up  ? ? ?  ?Assistance Recommended at Discharge None  ?Functional Status Assessment Patient has not had a recent decline in their functional status  ?Frequency and Duration    ?  ?  ?   ? ?Prognosis Prognosis for Safe Diet Advancement: Good  ? ?  ? ?Swallow Study   ?General Date of  Onset: 02/10/22 ?HPI: [name] [name]. is a 76 y.o. male with medical history significant of coronary artery disease, diabetes, hyperlipidemia, hypertension, gout, kidney stones, cervical myelopathy, and more presents the ED with a chief complaint of dizziness and weakness.   Patient does have a history of vertigo for which she is prescribed as needed meclizine patient reports he did not take the meclizine today.  He reports that he is just felt like he could not get any balance.  It was sudden in onset. MRI and CT negative for acute changes. Pt reports feeling back to baseline. Pt initially failed the [facility] swallow screen and MD requested. BSE. ?Type of Study: Bedside Swallow Evaluation ?Diet Prior to this Study: Regular;Thin liquids ?Temperature Spikes Noted: N/A ?Respiratory Status: Room air ?History of Recent Intubation: No ?Behavior/Cognition: Alert;Cooperative;Pleasant mood ?Oral Cavity Assessment: Within Functional Limits ?Oral Care Completed by SLP: No ?Oral Cavity - Dentition: Edentulous ?Vision: Functional for self-feeding ?Self-Feeding Abilities: Able to feed self ?Patient Positioning: Upright in bed ?Baseline Vocal Quality: Normal ?Volitional Cough: Strong ?Volitional Swallow: Able to elicit  ?  ?Oral/Motor/Sensory Function Overall Oral Motor/Sensory Function: Within functional limits   ?Ice Chips Ice chips: Within functional limits ?Presentation: Spoon   ?Thin Liquid Thin Liquid: Within functional limits ?Presentation: Cup;Self Fed;Straw  ?  ?Nectar Thick Nectar Thick Liquid: Not tested   ?Honey Thick Honey Thick Liquid: Not tested   ?Puree Puree: Within functional limits ?Presentation: Self Fed;Spoon   ?Solid ? ? ?  Solid: Within functional limits ?Presentation: Self Fed  ? ?  ?Thank you, ? ?Genene Churn, [place] ?[phone] ? ?Dunya Meiners ?02/11/2022,3:00 PM ? ? ? ? ?

## 2022-02-11 NOTE — ED Notes (Signed)
Patient transported to CT 

## 2022-02-11 NOTE — Assessment & Plan Note (Addendum)
-  2/2 weakness ?-CT head without acute changes ?-UDS neg ?-MRI negative ?-Patient's symptoms are most likely associated with vertigo ?-Physical therapy has evaluated patient with recommendation for home health services. ?

## 2022-02-11 NOTE — Assessment & Plan Note (Addendum)
-  With chronic right sided weakness and pain ?-Continue gabapentin ?-Continue patient follow-up with neurology/neurosurgery ?As needed. ? ?

## 2022-02-11 NOTE — Plan of Care (Signed)
?  Problem: Education: ?Goal: Knowledge of General Education information will improve ?Description: Including pain rating scale, medication(s)/side effects and non-pharmacologic comfort measures ?Outcome: Progressing ?  ?Problem: Health Behavior/Discharge Planning: ?Goal: Ability to manage health-related needs will improve ?Outcome: Progressing ?  ?Problem: Clinical Measurements: ?Goal: Ability to maintain clinical measurements within normal limits will improve ?Outcome: Progressing ?  ?Problem: Education: ?Goal: Knowledge of disease or condition will improve ?Outcome: Progressing ?  ?Problem: Coping: ?Goal: Will verbalize positive feelings about self ?Outcome: Progressing ?  ?

## 2022-02-12 DIAGNOSIS — R531 Weakness: Secondary | ICD-10-CM | POA: Diagnosis not present

## 2022-02-12 DIAGNOSIS — R42 Dizziness and giddiness: Secondary | ICD-10-CM

## 2022-02-12 DIAGNOSIS — E1140 Type 2 diabetes mellitus with diabetic neuropathy, unspecified: Secondary | ICD-10-CM | POA: Diagnosis not present

## 2022-02-12 DIAGNOSIS — G959 Disease of spinal cord, unspecified: Secondary | ICD-10-CM | POA: Diagnosis not present

## 2022-02-12 DIAGNOSIS — W19XXXA Unspecified fall, initial encounter: Secondary | ICD-10-CM | POA: Diagnosis not present

## 2022-02-12 LAB — CBC
HCT: 36.7 % — ABNORMAL LOW (ref 39.0–52.0)
Hemoglobin: 11.8 g/dL — ABNORMAL LOW (ref 13.0–17.0)
MCH: 29.1 pg (ref 26.0–34.0)
MCHC: 32.2 g/dL (ref 30.0–36.0)
MCV: 90.4 fL (ref 80.0–100.0)
Platelets: 264 10*3/uL (ref 150–400)
RBC: 4.06 MIL/uL — ABNORMAL LOW (ref 4.22–5.81)
RDW: 13.3 % (ref 11.5–15.5)
WBC: 7.8 10*3/uL (ref 4.0–10.5)
nRBC: 0 % (ref 0.0–0.2)

## 2022-02-12 LAB — BASIC METABOLIC PANEL
Anion gap: 8 (ref 5–15)
BUN: 18 mg/dL (ref 8–23)
CO2: 25 mmol/L (ref 22–32)
Calcium: 8.7 mg/dL — ABNORMAL LOW (ref 8.9–10.3)
Chloride: 104 mmol/L (ref 98–111)
Creatinine, Ser: 0.73 mg/dL (ref 0.61–1.24)
GFR, Estimated: >60 mL/min (ref >60)
Glucose, Bld: 158 mg/dL — ABNORMAL HIGH (ref 70–99)
Potassium: 3.8 mmol/L (ref 3.5–5.1)
Sodium: 137 mmol/L (ref 135–145)

## 2022-02-12 LAB — GLUCOSE, CAPILLARY
Glucose-Capillary: 155 mg/dL — ABNORMAL HIGH (ref 70–99)
Glucose-Capillary: 143 mg/dL — ABNORMAL HIGH (ref 70–99)

## 2022-02-12 LAB — MAGNESIUM: Magnesium: 1.8 mg/dL (ref 1.7–2.4)

## 2022-02-12 MED ORDER — CYANOCOBALAMIN 1000 MCG/ML IJ SOLN: 1000.0000 ug | Freq: Once | INTRAMUSCULAR | Status: DC

## 2022-02-12 MED ORDER — MECLIZINE HCL 25 MG PO TABS: 25.0000 mg | ORAL_TABLET | Freq: Three times a day (TID) | ORAL | 1 refills | Status: AC | PRN

## 2022-02-12 MED ORDER — VITAMIN B-12 1000 MCG PO TABS: 1000.0000 ug | ORAL_TABLET | Freq: Every day | ORAL | Status: AC

## 2022-02-12 MED ORDER — LISINOPRIL 10 MG PO TABS: 10.0000 mg | ORAL_TABLET | Freq: Every day | ORAL | 1 refills | Status: DC

## 2022-02-12 NOTE — Progress Notes (Signed)
Pt. Due for NIH Stroke Assessment q4h. Pt. requested that he be able to sleep tonight because he hasn't been able to sleep since he has been admitted. Performed NIH stroke assessment at [place] and will perform again at 0500 during morning med administration. Pt. Is asleep with call bell in reach. Will continue to monitor. ?

## 2022-02-12 NOTE — Discharge Summary (Signed)
?Physician Discharge Summary ?  ?Patient: [name] [name]. MRN: [id number] DOB: [birth year]  ?Admit date:     02/10/2022  ?Discharge date: 02/12/22  ?Discharge Physician: Barton Dubois  ? ?PCP: Corrington, Kip A, MD  ? ?Recommendations at discharge:  ?Repeat basic metabolic panel to follow ultralights and renal function ?Continue to follow response of patient's symptoms after initiation of meclizine and B12 ?-Make sure patient has follow-up as instructed with neurology service. ? ? ?Discharge Diagnoses: ?Principal Problem: ?  Generalized weakness ?Active Problems: ?  CAD S/P percutaneous coronary angioplasty ?  HTN (hypertension) ?  Hyperlipidemia ?  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  Diabetes mellitus with neuropathy ([place]) ?  Fall at home, initial encounter ?  Vertigo ?Class I obesity ? ?Brief Hospital admission narrative course: ?As per H&P written by Dr. Clearence Ped On 02/11/2022 ?[name] [name]. is a 76 y.o. male with medical history significant of coronary artery disease, diabetes, hyperlipidemia, hypertension, gout, kidney stones, cervical myelopathy, and more presents the ED with a chief complaint of dizziness and weakness.  Patient does have a history of vertigo for which she is prescribed as needed meclizine patient reports he did not take the meclizine today.  He reports that he is just felt like he could not get any balance.  It was sudden in onset.  Patient was able to walk at his baseline this morning, and then as the day went on he got weaker and weaker, and the dizziness got worse and worse.  When the dizziness first came on he did not think much of it because its not uncommon for him.  But it was worse than it has ever been per his report today.  He fell twice as well.  He felt like his legs were rubber underneath him.  He has chronic right-sided weakness following a cervical spine surgery that he had, but he is usually able to get around the house.  Patient reports he also has weakness with his swallow.   He chokes on liquids more than solids.  He reports that that is been a chronic issue as well, but may be a little worse right now.  Patient has no other complaints at this time. ?  ?Patient does not smoke, does not drink, does not use illicit drugs.  He is vaccinated for COVID.  Patient is full code. ? ?Assessment and Plan: ?* Generalized weakness ?-No acute deficits on exam ?-Acute stroke has been ruled out with CT scan and MRI ?-Continue secondary precautions ?-B12 borderline low and patient's symptoms consistent with vertigo. ?-As needed meclizine has been prescribed ?-Continue outpatient follow-up with neurology service ?-Patient advised to maintain adequate nutrition and hydration ?-Physical therapy has examined patient and recommendation for home health services provided. ? ? ?Vertigo ?-Started on meclizine ?-Outpatient with neurology service. ? ?Fall at home, initial encounter ?-2/2 weakness ?-CT head without acute changes ?-UDS neg ?-MRI negative ?-Patient's symptoms are most likely associated with vertigo ?-Physical therapy has evaluated patient with recommendation for home health services. ? ?Diabetes mellitus with neuropathy ([place]) ?-Resume home hypoglycemic regimen ?-Modified carbohydrate diet encouraged ?-A1c 7.0 ? 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 ? ? ?Hyperlipidemia ?-Continue statin ?-Heart healthy diet encouraged. ? ? ?HTN (hypertension) ?-Continue metoprolol and lisinopril ?-Vital signs has remained stable ?-Advised to follow heart healthy diet. ?-Continue to follow blood pressure and adjust regimen as required. ? ?CAD S/P percutaneous coronary angioplasty ?-Continue asa, statin,  lisinopril, and beta blocker ?-Patient denies chest pain or shortness of breath currently. ?-Continue patient follow-up with cardiology service. ? ?Class I obesity ?-Low calorie diet, portion  control and increase physical activity discussed with patient. ?-Body mass index is 33.17 kg/m?. ? ?Consultants: None ?Procedures performed: See below for x-ray reports. ?Disposition: Discharged home with home health services. ? ?Diet recommendation:  ?Discharge Diet Orders (From admission, onward)  ? ?  Start     Ordered  ? 02/12/22 0000  Diet - low sodium heart healthy       ? 02/12/22 1351  ? ?  ?  ? ?  ? ?DISCHARGE MEDICATION: ?Allergies as of 02/12/2022   ? ?   Reactions  ? Simvastatin Other (See Comments)  ? dizziness  ? ?  ? ?  ?Medication List  ?  ? ?TAKE these medications   ? ?acetaminophen 325 MG tablet ?Commonly known as: TYLENOL ?Take 1-2 tablets (325-650 mg total) by mouth every 4 (four) hours as needed for mild pain. ?  ?aspirin EC 81 MG tablet ?Take 1 tablet (81 mg total) by mouth daily. Swallow whole. ?  ?atorvastatin 20 MG tablet ?Commonly known as: LIPITOR ?Take 1 tablet by mouth daily. ?  ?b complex vitamins tablet ?Take 1 tablet by mouth daily. ?  ?colchicine 0.6 MG tablet ?Take 0.6 mg by mouth daily as needed (gout flare). ?  ?D3 PO ?Take 2,000 Units by mouth daily. ?  ?Fish Oil 1000 MG Caps ?Take 1,000 mg by mouth 2 (two) times daily. ?  ?gabapentin 300 MG capsule ?Commonly known as: NEURONTIN ?Take 2 capsules (600 mg total) by mouth 2 (two) times daily. ?  ?glipiZIDE 5 MG tablet ?Commonly known as: GLUCOTROL ?Take 5 mg by mouth daily. ?  ?LIFESCAN FINEPOINT LANCETS Misc ?Use to check blood sugar 3 time(s) daily ?  ?lisinopril 10 MG tablet ?Commonly known as: ZESTRIL ?Take 1 tablet (10 mg total) by mouth daily. ?Start taking on: February 13, 2022 ?  ?meclizine 25 MG tablet ?Commonly known as: ANTIVERT ?Take 1 tablet (25 mg total) by mouth 3 (three) times daily as needed for dizziness. ?  ?metFORMIN 850 MG tablet ?Commonly known as: GLUCOPHAGE ?Take 1 tablet (850 mg total) by mouth 2 (two) times daily with a meal. ?  ?metoprolol succinate 50 MG 24 hr tablet ?Commonly known as: TOPROL-XL ?TAKE 1 TABLET  BY MOUTH ONCE DAILY WITH OR IMMEDIATELY FOLLOWING A MEAL ?What changed: See the new instructions. ?  ?omeprazole 20 MG capsule ?Commonly known as: PRILOSEC ?Take 20 mg by mouth daily. ?  ?tamsulosin 0.4 MG Caps capsule ?Commonly known as: FLOMAX ?Take 0.4 mg by mouth at bedtime. ?  ?vitamin B-12 1000 MCG tablet ?Commonly known as: CYANOCOBALAMIN ?Take 1 tablet (1,000 mcg total) by mouth daily. ?  ? ?  ? ? Follow-up Information   ? ? Corrington, Kip A, MD. Schedule an appointment as soon as possible for a visit in 10 day(s).   ?Specialty: Family Medicine ?Contact information: ?[address] ?[place] [address] ?[phone] ? ? ?  ?  ? ? Minus Breeding, MD .   ?Specialty: Cardiology ?Contact information: ?[address] ?[address] ?[place] [address] ?[phone] ? ? ?  ?  ? ?  ?  ? ?  ? ?Discharge Exam: ?Filed Weights  ? 02/10/22 2323 02/11/22 0514  ?Weight: 98.9 kg 101.9 kg  ? ?General exam: Alert, awake, oriented x 3; still reporting feeling slightly woozy when changing position  or movement.  No chest pain, no nausea, no vomiting, no shortness of breath.  No focal deficits on exam. ?Respiratory system: Clear to auscultation. Respiratory effort normal.  No using accessory muscle. ?Cardiovascular system:RRR. No murmurs, rubs, gallops.  No JVD. ?Gastrointestinal system: Abdomen is nondistended, soft and nontender. No organomegaly or masses felt. Normal bowel sounds heard. ?Central nervous system: Alert and oriented. No focal neurological deficits. ?Extremities: No cyanosis or clubbing. ?Skin: No petechiae. ?Psychiatry: Judgement and insight appear normal. Mood & affect appropriate.  ? ? ?Condition at discharge: Stable and improved. ? ?The results of significant diagnostics from this hospitalization (including imaging, microbiology, ancillary and laboratory) are listed below for reference.  ? ?Imaging Studies: ?CT HEAD WO CONTRAST ? ?Result Date: 02/11/2022 ?CLINICAL DATA:  Urological deficit, acute,  stroke suspected. Multiple falls due to increased weakness. Headache and dizziness. EXAM: CT HEAD WITHOUT CONTRAST TECHNIQUE: Contiguous axial images were obtained from the base of the skull through the ve

## 2022-02-12 NOTE — TOC Transition Note (Signed)
Transition of Care (TOC) - CM/SW Discharge Note ? ? ?Patient Details  ?Name: Perkins Molina. ?MRN: [id number] ?Date of Birth: 1946/02/14 ? ?Transition of Care (TOC) CM/SW Contact:  ?Shade Flood, LCSW ?Phone Number: ?02/12/2022, 10:58 AM ? ? ?Clinical Narrative:    ? ?Pt stable for dc today per MD. Damaris Schooner with pt at bedside to review dc plan. Again offered HH and pt again refused. Pt states he has transport home. There are no TOC needs identified for dc. ? ?Final next level of care: Home/Self Care ?Barriers to Discharge: Barriers Resolved ? ? ?Patient Goals and CMS Choice ?Patient states their goals for this hospitalization and ongoing recovery are:: go home ?  ?  ? ?Discharge Placement ?  ?           ?  ?  ?  ?  ? ?Discharge Plan and Services ?In-house Referral: Clinical Social Work ?  ?           ?  ?  ?  ?  ?  ?HH Arranged: Refused HH ?  ?  ?  ?  ? ?Social Determinants of Health (SDOH) Interventions ?  ? ? ?Readmission Risk Interventions ?   ? View : No data to display.  ?  ?  ?  ? ? ? ? ? ?

## 2022-02-12 NOTE — Assessment & Plan Note (Signed)
-  Started on meclizine ?-Outpatient with neurology service. ?

## 2022-02-13 LAB — VITAMIN B1: Vitamin B1 (Thiamine): 194.9 nmol/L (ref 66.5–200.0)

## 2023-01-19 ENCOUNTER — Other Ambulatory Visit: Payer: Self-pay | Admitting: Cardiology

## 2023-03-15 IMAGING — MR MR HEAD W/O CM
11 of 12 series · 40 of 48 positions shown · non-contrast
Comparison: Same-day noncontrast CT head, brain MRI 09/15/2020

CLINICAL DATA: Unable to walk, altered mental status, stroke
suspected

EXAM:
MRI HEAD WITHOUT CONTRAST
TECHNIQUE: Multiplanar, multiecho pulse sequences of the brain and surrounding
structures were obtained without intravenous contrast.

[Series 5: DWI · axial · 4.0mm · 0.88mm/px · z∈[-82,+57]mm · 4 of 36 slices shown (1 of 6)]
[im 1/36]
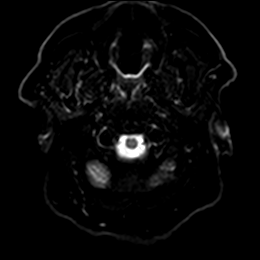
[im 12/36]
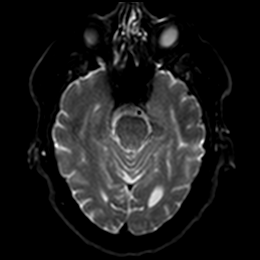
[im 24/36]
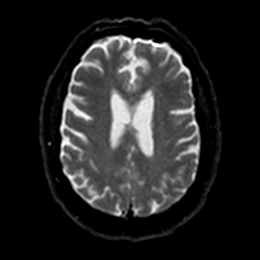
[im 36/36]
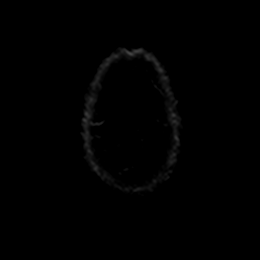

[Series 5: DWI · axial · 4.0mm · 0.88mm/px · z∈[-82,+57]mm · 4 of 36 slices shown (2 of 6)]
[im 1/36]
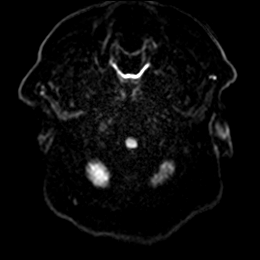
[im 12/36]
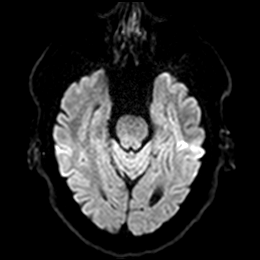
[im 24/36]
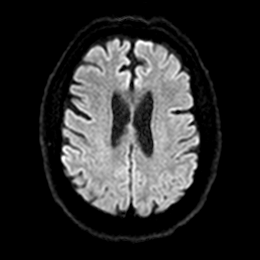
[im 36/36]
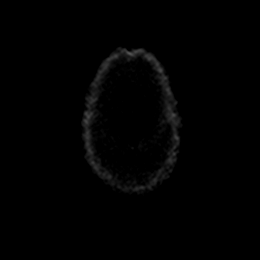

[Series 6: DWI · axial · 4.0mm · 0.88mm/px · z∈[-82,+57]mm · 4 of 36 slices shown (3 of 6)]
[im 1/36]
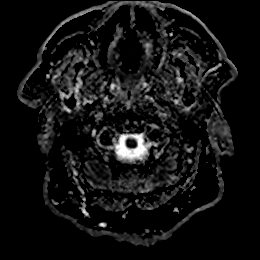
[im 12/36]
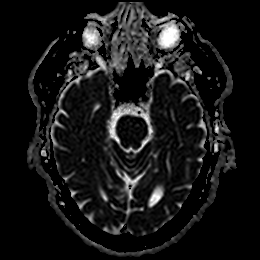
[im 24/36]
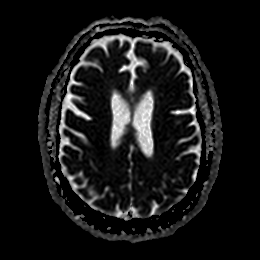
[im 36/36]
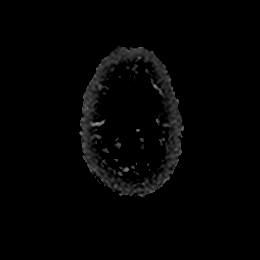

[Series 7: DWI · coronal · 5.0mm · 0.88mm/px · 4 of 30 slices shown (4 of 6)]
[im 1/30]
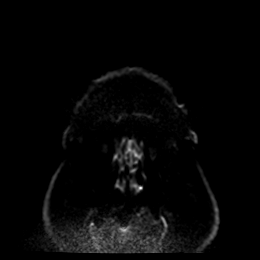
[im 10/30]
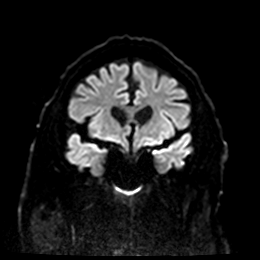
[im 20/30]
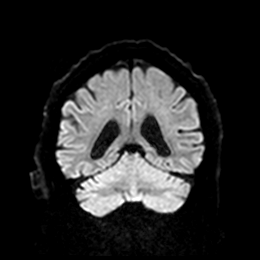
[im 30/30]
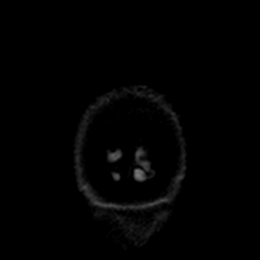

[Series 7: DWI · coronal · 5.0mm · 0.88mm/px · 4 of 30 slices shown (5 of 6)]
[im 1/30]
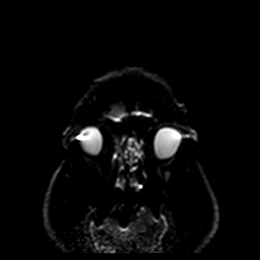
[im 10/30]
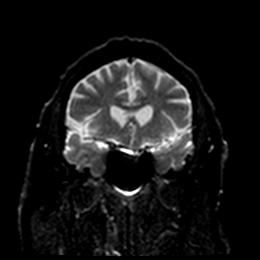
[im 20/30]
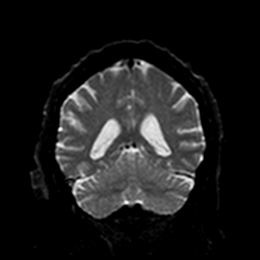
[im 30/30]
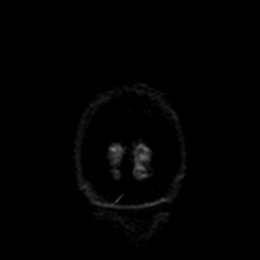

[Series 8: DWI · coronal · 5.0mm · 0.88mm/px · 4 of 30 slices shown (6 of 6)]
[im 1/30]
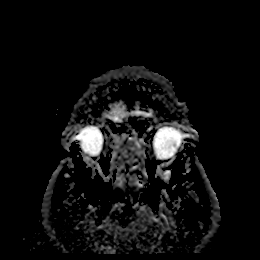
[im 10/30]
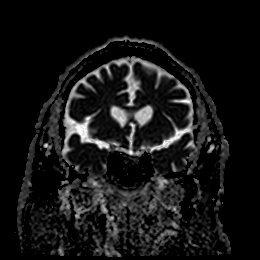
[im 20/30]
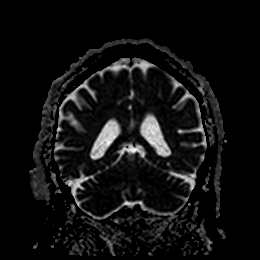
[im 30/30]
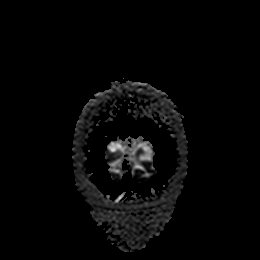

[Series 9: T1 · sagittal · 5.0mm · 0.94mm/px · 3 of 21 slices shown]
[im 1/21]
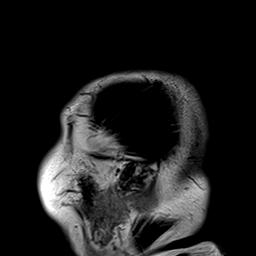
[im 11/21]
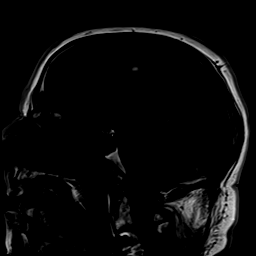
[im 21/21]
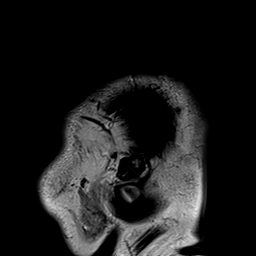

[Series 10: T2 · axial · 5.0mm · 0.72mm/px · z∈[-78,+54]mm · 2 of 20 slices shown (1 of 2)]
[im 1/20]
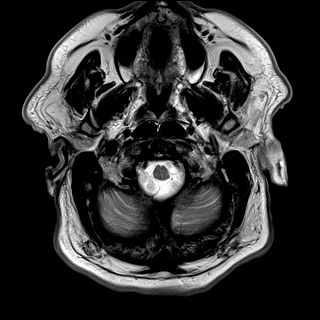
[im 20/20]
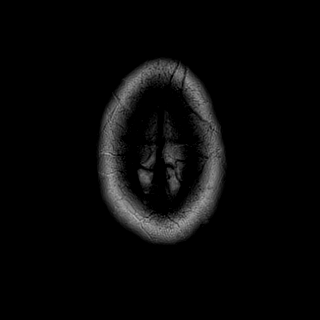

[Series 11: ax hemo · axial · 5.0mm · 0.86mm/px · z∈[-82,+61]mm · 3 of 25 slices shown]
[im 1/25]
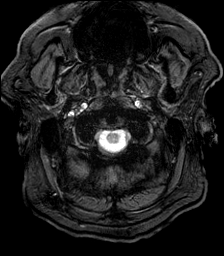
[im 13/25]
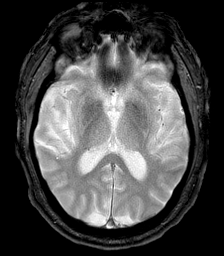
[im 25/25]
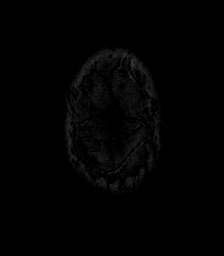

[Series 12: FLAIR · axial · 4.0mm · 0.43mm/px · z∈[-72,+51]mm · 4 of 32 slices shown]
[im 1/32]
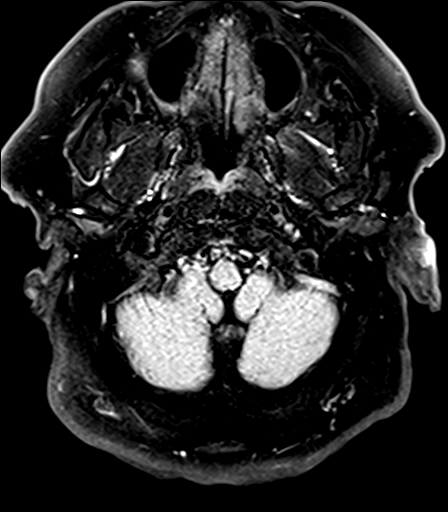
[im 11/32]
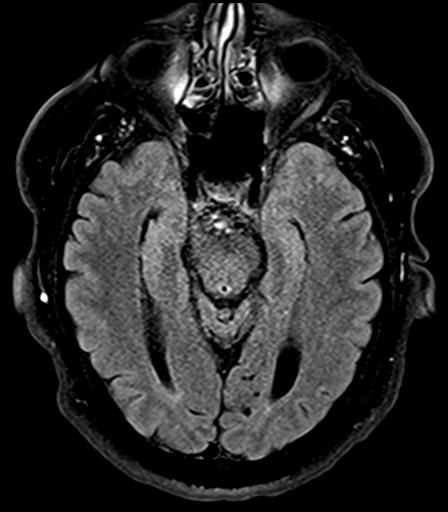
[im 21/32]
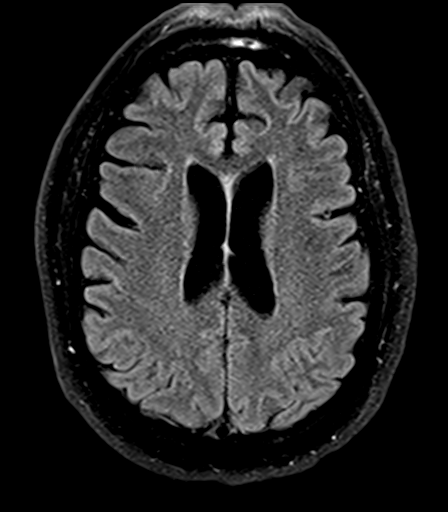
[im 32/32]
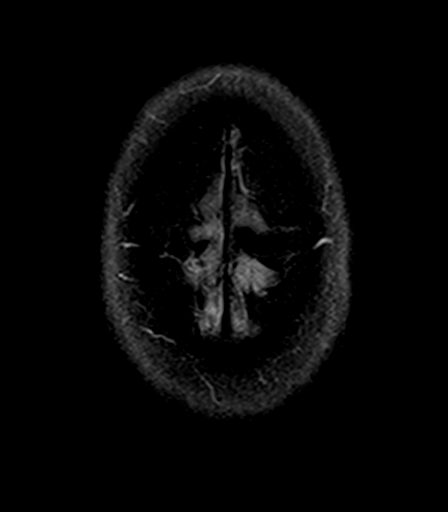

[Series 14: T2 · coronal · 5.0mm · 0.72mm/px · 4 of 28 slices shown (2 of 2)]
[im 1/28]
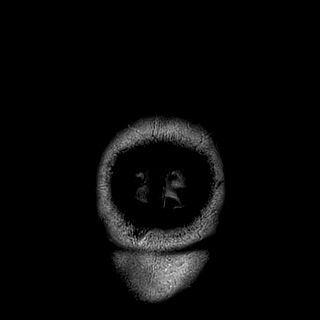
[im 10/28]
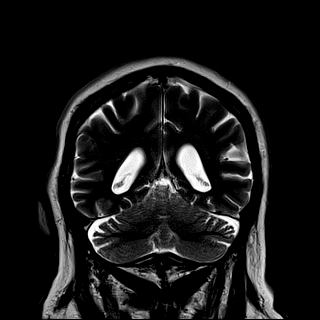
[im 19/28]
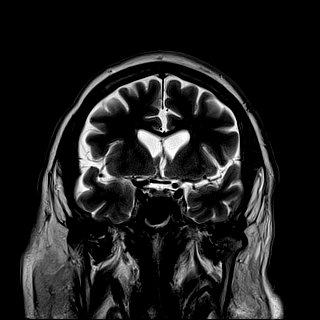
[im 28/28]
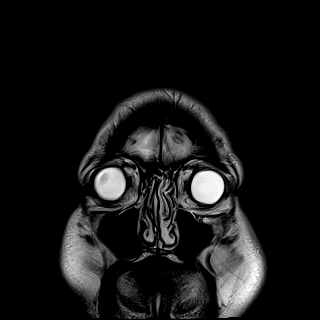

[40 of 48 positions shown; findings below may reference images not displayed]

FINDINGS: Brain: There is no evidence of acute intracranial hemorrhage,
extra-axial fluid collection, or acute infarct.

Parenchymal volume is normal for age. The ventricles are normal in
size. Parenchymal signal is normal, with no significant burden of
white matter microangiopathic change.

There is no suspicious parenchymal signal abnormality. There is no
mass lesion. There is no mass effect or midline shift.

Vascular: Normal flow voids.

Skull and upper cervical spine: Postsurgical changes are partially
imaged in the upper cervical spine. There is no suspicious marrow
signal abnormality.

Sinuses/Orbits: There is mild mucosal thickening in the paranasal
sinuses. The globes and orbits are unremarkable.

Other: None.
IMPRESSION: Unremarkable for age brain MRI with no acute intracranial pathology.

## 2023-03-15 IMAGING — CT CT HEAD W/O CM
4 series · 16 of 47 positions shown, 18 images · non-contrast
Comparison: CT head 07/15/2020.  MRI brain 02/08/2021

CLINICAL DATA: Urological deficit, acute, stroke suspected.
Multiple falls due to increased weakness. Headache and dizziness.



[Series 2: head w o · axial · 0.47mm/px · z∈[+13,+133]mm · 7 of 32 slices shown, 9 images]
[im 4/32  brain]
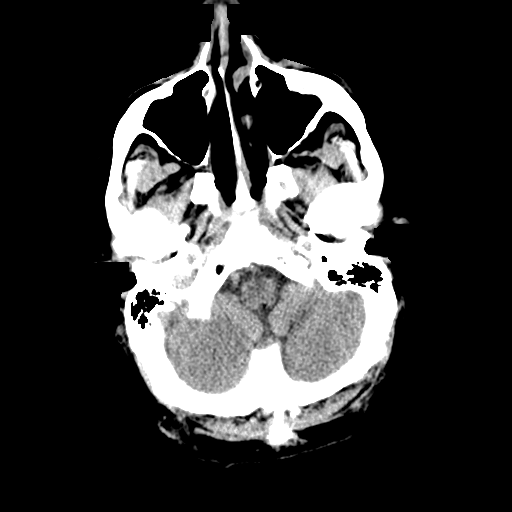
[im 4/32  bone]
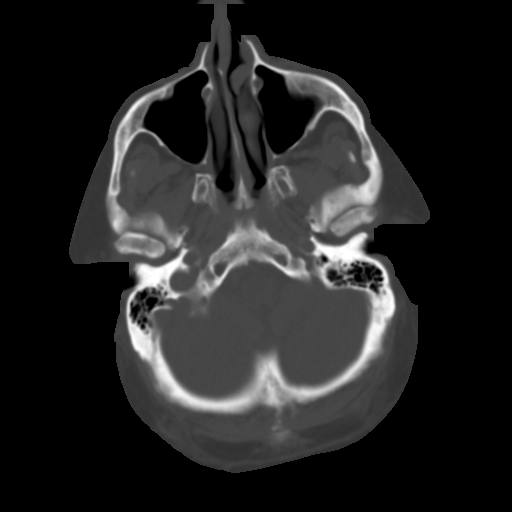
[im 8/32  brain]
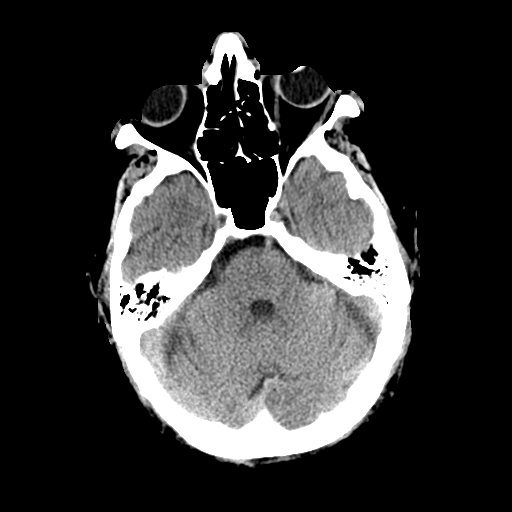
[im 12/32  brain]
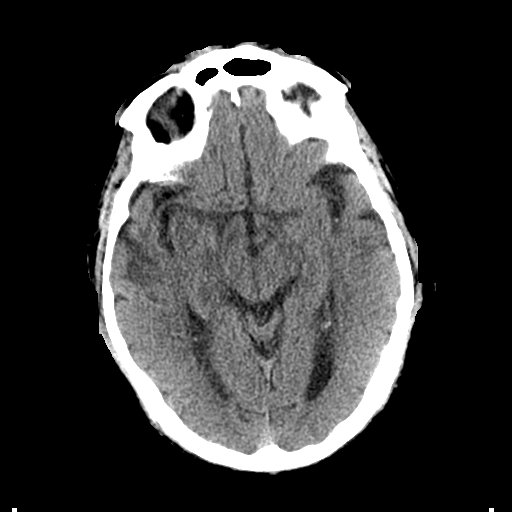
[im 16/32  brain]
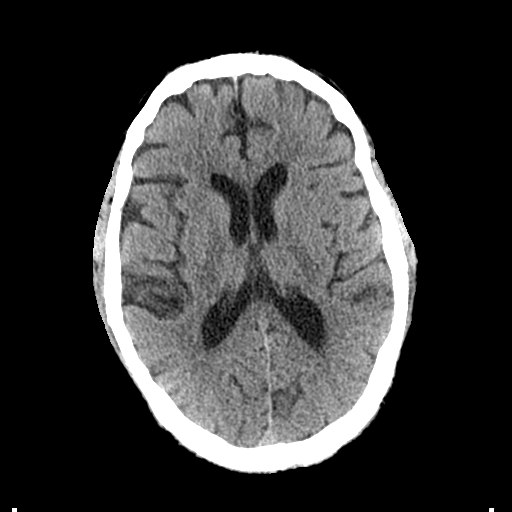
[im 20/32  brain]
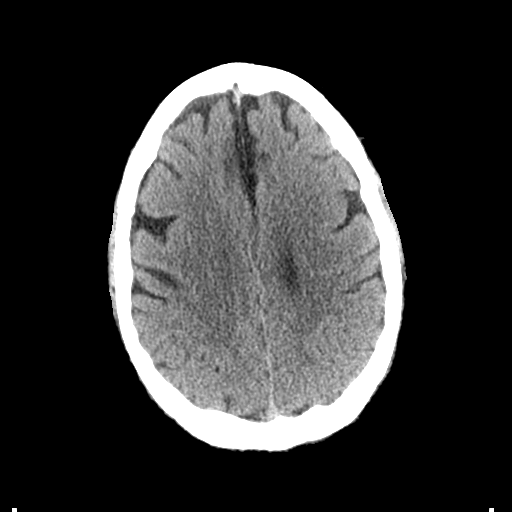
[im 20/32  bone]
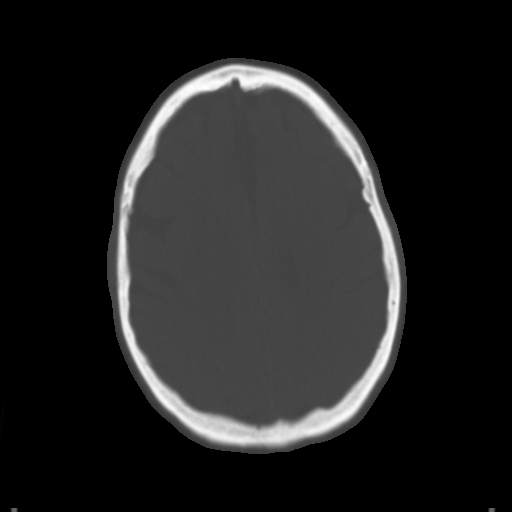
[im 24/32  brain]
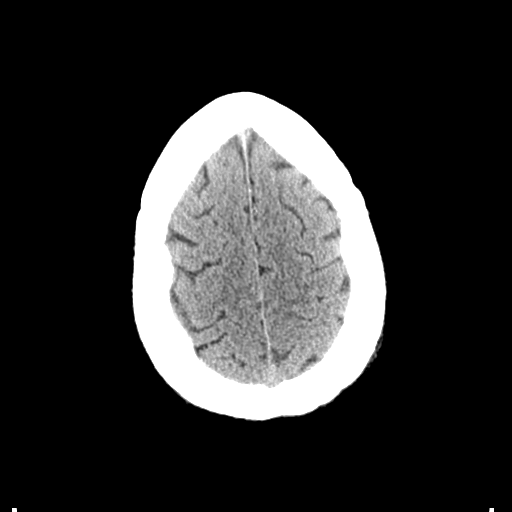
[im 28/32  brain]
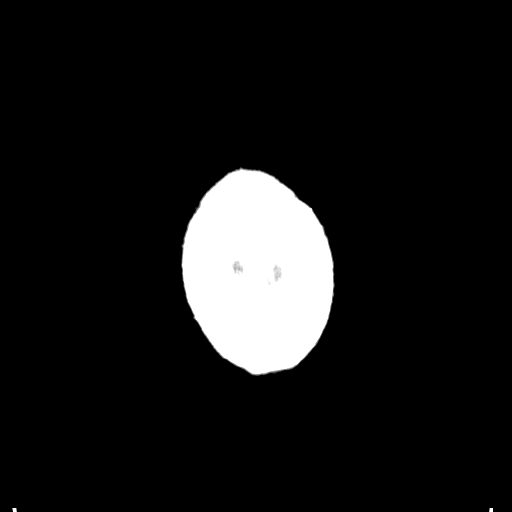

[Series 3: head bone · axial · 0.47mm/px · z∈[+12,+44]mm · 3 of 78 slices shown]
[im 8/78  bone]
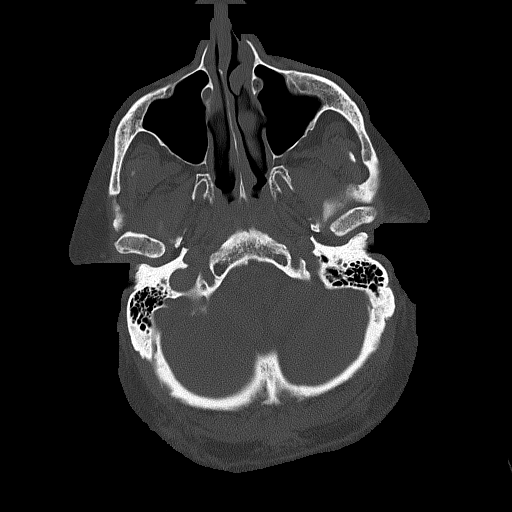
[im 16/78  bone]
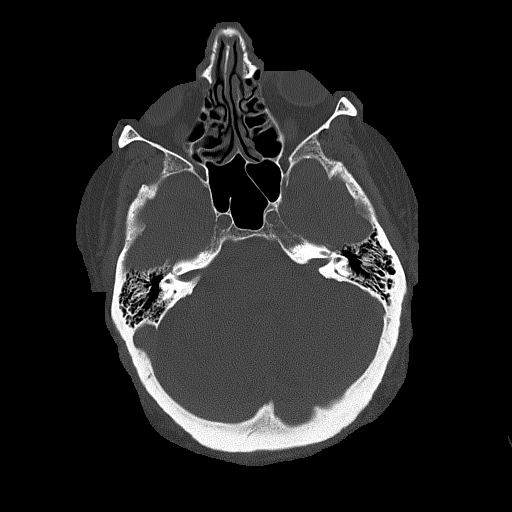
[im 24/78  bone]
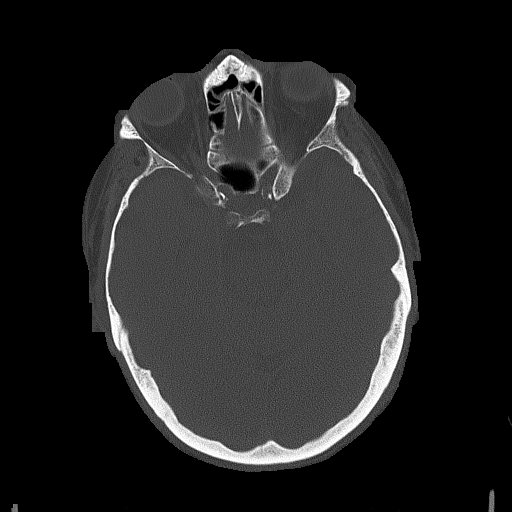

[Series 4: coronal soft · coronal · 0.32mm/px · 3 of 82 slices shown]
[im 28/82  brain]
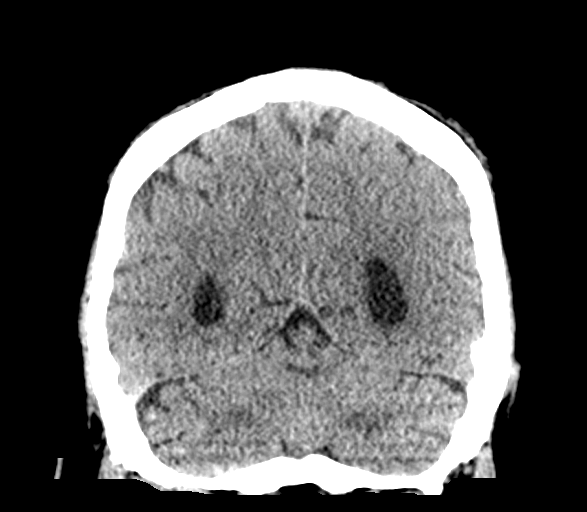
[im 37/82  brain]
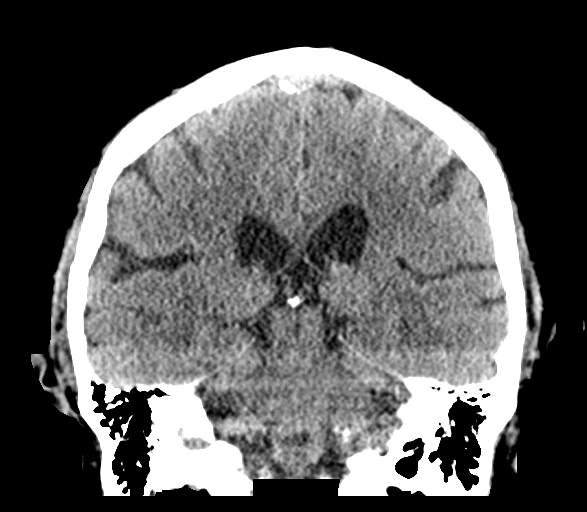
[im 46/82  brain]
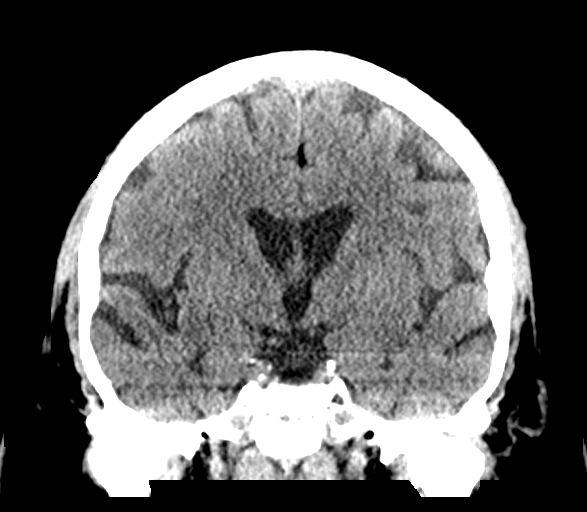

[Series 5: sagittal soft · sagittal · 0.32mm/px · 3 of 63 slices shown]
[im 21/63  brain]
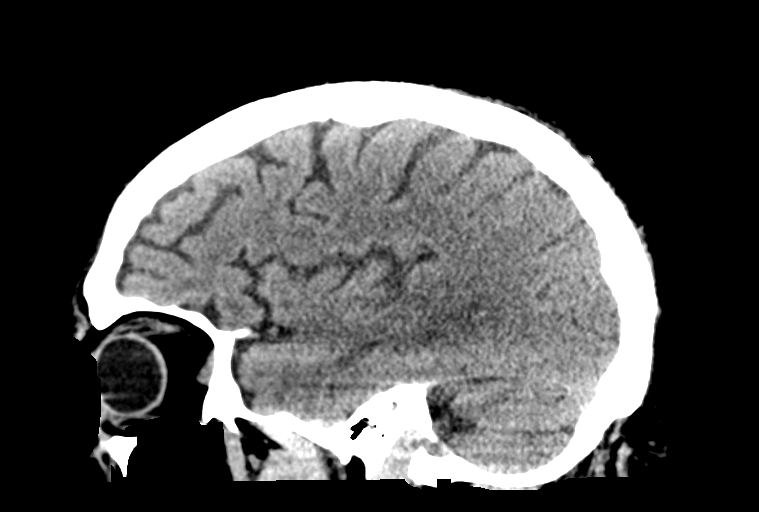
[im 32/63  brain]
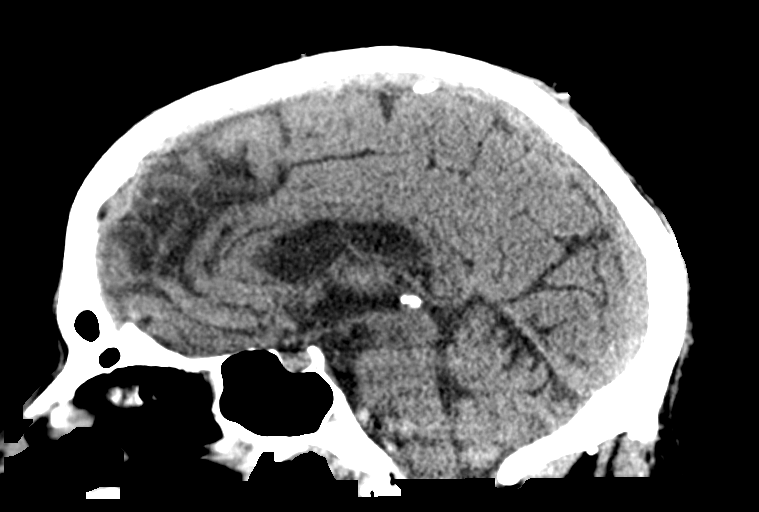
[im 42/63  brain]
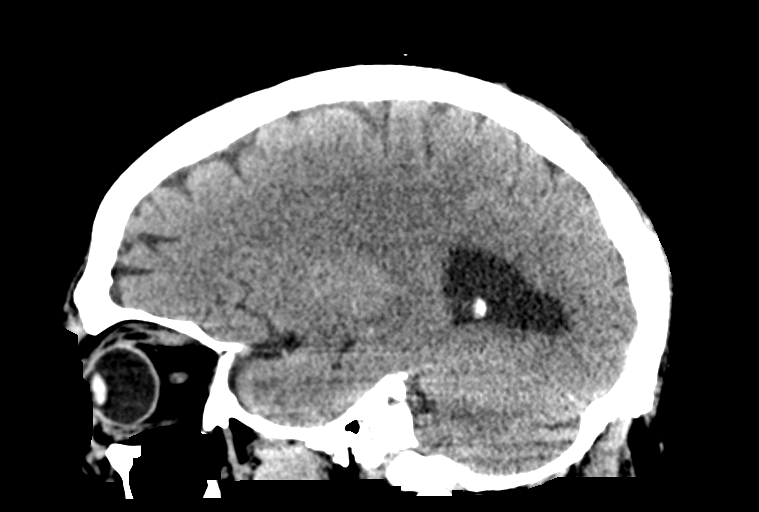

[16 of 47 positions shown; findings below may reference images not displayed]

FINDINGS: Brain: No evidence of acute infarction, hemorrhage, hydrocephalus,
extra-axial collection or mass lesion/mass effect. Mild cerebral
atrophy.

Vascular: Moderate intracranial arterial vascular calcifications.

Skull: Calvarium appears intact.

Sinuses/Orbits: Paranasal sinuses and mastoid air cells are clear.

Other: None.
IMPRESSION: No acute intracranial abnormalities.  Mild chronic atrophy.

## 2023-04-21 ENCOUNTER — Other Ambulatory Visit: Payer: Self-pay | Admitting: Cardiology

## 2023-06-14 ENCOUNTER — Other Ambulatory Visit: Payer: Self-pay | Admitting: Cardiology

## 2023-06-23 NOTE — Progress Notes (Unsigned)
Cardiology Office Note:   Date:  06/25/2023  ID:  [name] Guard., DOB Aug 30, 1946, MRN [id number] PCP: Vivien Presto, MD  [facility] Providers Cardiologist:  Rollene Rotunda, MD {  History of Present Illness:   [name] Sammon. is a 77 y.o. male with a PMH of CAD s/p angioplasty in early 1990s, HTN, HLD, mild carotid artery disease, DM type 2,who previously saw Dr. Purvis Sheffield.  His had an ischemic evaluation with a NST 11/2018 which was a low risk study with a medium defect of moderate severity in the mid-inferoseptal and mid-inferior location which was felt to be 2/2 soft tissue attenuation; EF 54%. 12/2019.   He was admitted to [facility] from 07/04/20-07/06/20 after presenting with acute left arm weakness. CT Head and MRI brain were negative for acute findings. Bilateral carotid dopplers showed mild bilateral carotid artery disease. Echo showed EF 55-60%, G1DD, mild to moderate LVH, mild LAE, negative bubble study, and no significant valvular abnormalities. 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  He was started on low dose atorvastatin in addition to home fenofibrate for elevated triglycerides. He was subsequently seen in the ED 07/15/20 for similar complaints of left arm weakness with possible facial numbness. Code stroke activated and he underwent a CT head and MRI brain which were again negative.   He had redo spine surgery.     He presents for follow up.  At his visit last year he was going to have a perfusion study because he been complaining of some shortness of breath.  But shortly after her visit he was hospitalized with generalized weakness.  I reviewed these records for this visit.  It is not entirely clear what the etiology was although he was not thought to have an acute stroke.  There was vertigo.  It looks like the perfusion study I ordered was canceled  probably because he was in the hospital.  He he presents today and says that he is very limited by his back pain and balance issues and there is nothing they can do for his back.  He rides a Electrical [employer].  He walks around is probably a little bit.  He might get some chest tightness when he does this but this has been a stable exertional anginal pattern.  He has no symptoms at rest such as chest pressure, neck or arm discomfort.  He has no shortness of breath at rest, PND or orthopnea.  He had no palpitations.  However, he does get occasionally dizzy and he feels presyncopal.  He says this is random.  He has not had any actual loss of consciousness.  ROS: As stated in the HPI and negative for all other systems.  Studies Reviewed:    EKG:   Sinus rhythm, rate 74, right bundle branch block, left anterior fascicular block, no change from previous.  Risk Assessment/Calculations:              Physical Exam:   VS:  BP 110/60   Pulse 74   Ht 5\' 10"  (1.778 m)   Wt 221 lb (100.2 kg)   BMI 31.71 kg/m    Wt Readings from Last 3 Encounters:  06/25/23 221 lb (100.2 kg)  02/11/22 224 lb 10.4 oz (101.9 kg)  01/02/22 226 lb (102.5 kg)     GEN: Well nourished, well developed in no acute distress NECK: No JVD; No carotid bruits CARDIAC: RRR,  no murmurs, rubs, gallops RESPIRATORY:  Clear to auscultation without rales, wheezing or rhonchi  ABDOMEN: Soft, non-tender, non-distended EXTREMITIES:  No edema; No deformity   ASSESSMENT AND PLAN:   CAD s/p remote angioplasty:   He has had exertional stable angina.  No change in therapy.  He will continue with risk reduction.  He says this is mild as he is particularly limited in activities.   Presyncope: I am going to have him wear a 4-week monitor given his conduction disturbance.   HTN:    The blood pressure is at target.  No change in therapy.   HLD: LDL was 42 last year.  He is due to get his labs drawn soon.   Carotid artery disease: This was mild  in 2021.  No change in therapy.   DM type 2:   A1c was 6.5 in February.  No change in therapy.       Follow up with me in one year.   Signed, Rollene Rotunda, MD

## 2023-06-25 ENCOUNTER — Encounter: Payer: Self-pay | Admitting: *Deleted

## 2023-06-25 ENCOUNTER — Ambulatory Visit: Payer: Medicare HMO | Admitting: Cardiology

## 2023-06-25 ENCOUNTER — Encounter: Payer: Self-pay | Admitting: Cardiology

## 2023-06-25 ENCOUNTER — Other Ambulatory Visit: Payer: Self-pay | Admitting: Cardiology

## 2023-06-25 VITALS — BP 110/60 | HR 74 | Ht 70.0 in | Wt 221.0 lb

## 2023-06-25 DIAGNOSIS — R001 Bradycardia, unspecified: Secondary | ICD-10-CM

## 2023-06-25 DIAGNOSIS — I2510 Atherosclerotic heart disease of native coronary artery without angina pectoris: Secondary | ICD-10-CM | POA: Diagnosis not present

## 2023-06-25 DIAGNOSIS — I10 Essential (primary) hypertension: Secondary | ICD-10-CM | POA: Diagnosis not present

## 2023-06-25 DIAGNOSIS — E785 Hyperlipidemia, unspecified: Secondary | ICD-10-CM

## 2023-06-25 DIAGNOSIS — E118 Type 2 diabetes mellitus with unspecified complications: Secondary | ICD-10-CM | POA: Diagnosis not present

## 2023-06-25 DIAGNOSIS — R42 Dizziness and giddiness: Secondary | ICD-10-CM

## 2023-06-25 DIAGNOSIS — Z7984 Long term (current) use of oral hypoglycemic drugs: Secondary | ICD-10-CM

## 2023-06-25 DIAGNOSIS — R55 Syncope and collapse: Secondary | ICD-10-CM

## 2023-06-25 DIAGNOSIS — I452 Bifascicular block: Secondary | ICD-10-CM

## 2023-06-25 NOTE — Patient Instructions (Signed)
Medication Instructions:  The current medical regimen is effective;  continue present plan and medications.  *If you need a refill on your cardiac medications before your next appointment, please call your pharmacy*  Testing/Procedures: Your physician has recommended that you wear an event monitor for 30 days. Event monitors are medical devices that record the heart's electrical activity. Doctors most often [place] these monitors to diagnose arrhythmias. Arrhythmias are problems with the speed or rhythm of the heartbeat. The monitor is a small, portable device. You can wear one while you do your normal daily activities. This is usually used to diagnose what is causing palpitations/syncope (passing out).  Follow-Up: At [facility], you and your health needs are our priority.  As part of our continuing mission to provide you with exceptional heart care, we have created designated Provider Care Teams.  These Care Teams include your primary Cardiologist (physician) and Advanced Practice Providers (APPs -  Physician Assistants and Nurse Practitioners) who all work together to provide you with the care you need, when you need it.  We recommend signing up for the patient portal called "MyChart".  Sign up information is provided on this After Visit Summary.  MyChart is used to connect with patients for Virtual Visits (Telemedicine).  Patients are able to view lab/test results, encounter notes, upcoming appointments, etc.  Non-urgent messages can be sent to your provider as well.   To learn more about what you can do with MyChart, go to [email or web address].    Your next appointment:   1 year(s)  Provider:   Rollene Rotunda, MD

## 2023-06-27 DIAGNOSIS — R001 Bradycardia, unspecified: Secondary | ICD-10-CM

## 2023-06-27 DIAGNOSIS — R42 Dizziness and giddiness: Secondary | ICD-10-CM

## 2023-06-27 DIAGNOSIS — R55 Syncope and collapse: Secondary | ICD-10-CM

## 2023-06-27 DIAGNOSIS — I452 Bifascicular block: Secondary | ICD-10-CM | POA: Diagnosis not present

## 2023-07-18 ENCOUNTER — Other Ambulatory Visit: Payer: Self-pay | Admitting: Cardiology

## 2023-08-28 ENCOUNTER — Ambulatory Visit: Payer: Medicare HMO | Attending: Cardiology

## 2023-08-28 DIAGNOSIS — R55 Syncope and collapse: Secondary | ICD-10-CM

## 2023-08-28 DIAGNOSIS — R001 Bradycardia, unspecified: Secondary | ICD-10-CM

## 2023-08-28 DIAGNOSIS — R42 Dizziness and giddiness: Secondary | ICD-10-CM

## 2023-08-28 DIAGNOSIS — I452 Bifascicular block: Secondary | ICD-10-CM

## 2023-09-08 ENCOUNTER — Encounter: Payer: Self-pay | Admitting: *Deleted

## 2023-09-08 ENCOUNTER — Telehealth: Payer: Self-pay | Admitting: Cardiology

## 2023-09-08 NOTE — Telephone Encounter (Signed)
Patient was returning call, states he dont have access to [employer]. Please advise

## 2023-09-08 NOTE — Telephone Encounter (Signed)
  Patient returning call regarding results 

## 2023-09-08 NOTE — Telephone Encounter (Signed)
Called patient and no answer. Left message that results with provider message can be seen in MyChart message.

## 2023-09-08 NOTE — Telephone Encounter (Signed)
Spoke with patient and he is aware of his monitor results. He states he can explain his syncope and dizziness. He states he had neck surgery twice and has nerve damage which causes his syncope and dizziness.

## 2024-03-06 ENCOUNTER — Ambulatory Visit
Admission: EM | Admit: 2024-03-06 | Discharge: 2024-03-06 | Disposition: A | Discharge status: Home / Self Care | Attending: Nurse Practitioner | Admitting: Nurse Practitioner

## 2024-03-06 ENCOUNTER — Ambulatory Visit (INDEPENDENT_AMBULATORY_CARE_PROVIDER_SITE_OTHER)

## 2024-03-06 DIAGNOSIS — E162 Hypoglycemia, unspecified: Secondary | ICD-10-CM | POA: Diagnosis not present

## 2024-03-06 DIAGNOSIS — R059 Cough, unspecified: Secondary | ICD-10-CM

## 2024-03-06 DIAGNOSIS — J069 Acute upper respiratory infection, unspecified: Secondary | ICD-10-CM | POA: Diagnosis not present

## 2024-03-06 LAB — POCT FASTING CBG KUC MANUAL ENTRY: POCT Glucose (KUC): 109 mg/dL — AB (ref 70–99)

## 2024-03-06 MED ORDER — FLUTICASONE PROPIONATE 50 MCG/ACT NA SUSP
2.0000 | Freq: Every day | NASAL | 0 refills | Status: AC
Start: 2024-03-06 — End: ?

## 2024-03-06 MED ORDER — AMOXICILLIN-POT CLAVULANATE 875-125 MG PO TABS: 1.0000 | ORAL_TABLET | Freq: Two times a day (BID) | ORAL | 0 refills | Status: AC

## 2024-03-06 NOTE — ED Notes (Signed)
 Performed an ambulatory assessment. Pt 02 level maintained 97%.

## 2024-03-06 NOTE — ED Triage Notes (Signed)
 Pt reports he has SOB, fever, and a cough x 5 days mid chest discomfort x 3 days

## 2024-03-06 NOTE — Discharge Instructions (Signed)
 The chest x-ray was negative for pneumonia. Take medication as prescribed. Increase fluids and allow for plenty of rest. May use normal saline nasal spray throughout the day for nasal congestion and runny nose. For the cough, recommend over-the-counter Robitussin or you may continue Mucinex  for the cough.  You may also use Delsym. Recommend use of a humidifier in the bedroom at nighttime during sleep to help with cough and nasal congestion. If symptoms fail to improve with this treatment, please follow-up with your primary care physician for further evaluation. Follow-up as needed.

## 2024-03-06 NOTE — ED Provider Notes (Signed)
 [facility]    CSN: [id number] Arrival date & time: 03/06/24  1410      History   Chief Complaint No chief complaint on file.   HPI [name] [name]. is a 78 y.o. male.   The history is provided by the patient and a relative (Son).   Patient presents for complaints of cough and shortness of breath has been present for the past 5 days.  Patient states he initially had a fever when symptoms started, did not measure it, states he took Tylenol  and symptoms seem to improve.  He reports over the past several days, he has had a persistent cough with shortness of breath when at rest and with exertion.  He also complains of nasal congestion and head congestion.  Denies chills, headache, ear pain, sinus pain, sinus pressure, difficulty breathing, chest pain, abdominal pain, nausea, vomiting, diarrhea, or rash.  Patient reports he smoked previously, states he is quit for the past several years.  Denies history of asthma or lung disease.  Reports he has been taking Mucinex  for his symptoms.  Patient son states patient is complaining of feeling like his "sugar is dropping." Past Medical History:  Diagnosis Date   CAD (coronary artery disease)    Cancer (HCC)    Diabetes (HCC)    Dyslipidemia    Gout    History of kidney stones    HTN (hypertension)    Myocardial infarction [facility])     Patient Active Problem List   Diagnosis Date Noted   Vertigo    Generalized weakness 02/11/2022   Fall at home, initial encounter 02/11/2022   Bradycardia 12/26/2020   Diabetes mellitus with neuropathy (HCC) 10/04/2020   Neurogenic bladder 10/04/2020   Tobacco use disorder 10/04/2020   Gout 10/04/2020   Constipation 10/04/2020   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   Cervical myelopathy (HCC) 10/20/2019   SOB (shortness of breath) on exertion 07/22/2013   CAD S/P percutaneous coronary angioplasty 07/09/2013   HTN (hypertension) 07/09/2013   Hyperlipidemia 07/09/2013   Chest pain 07/09/2013     Past Surgical History:  Procedure Laterality Date   ANTERIOR CERVICAL DECOMP/DISCECTOMY FUSION Left 10/20/2019   Procedure: Left Cervical Three-Four Cervical Four-Five Cervical Five-Six Anterior cervical decompression/discectomy/fusion;  Surgeon: Cannon Champion, MD;  Location: [facility] OR;  Service: Neurosurgery;  Laterality: Left;  Left Cervical Three-Four Cervical Four-Five Cervical Five-Six Anterior cervical decompression/discectomy/fusion   BACK SURGERY     left knee surgery     left shoulder surgery     POSTERIOR CERVICAL FUSION/FORAMINOTOMY N/A 09/17/2020   Procedure: CERVICAL THREE TO CERVICAL SIX POSTERIOR CERVICAL DECOMPRESSION;  Surgeon: Cannon Champion, MD;  Location: [facility] OR;  Service: Neurosurgery;  Laterality: N/A;   right knee surgery         Home Medications    Prior to Admission medications   Medication Sig Start Date End Date Taking? Authorizing Provider  amoxicillin-clavulanate (AUGMENTIN) 875-125 MG tablet Take 1 tablet by mouth every 12 (twelve) hours for 5 days. 03/06/24 03/11/24 Yes Leath-Warren, Belen Bowers, NP  fluticasone (FLONASE) 50 MCG/ACT nasal spray Place 2 sprays into both nostrils daily. 03/06/24  Yes Leath-Warren, Belen Bowers, NP  acetaminophen  (TYLENOL ) 325 MG tablet Take 1-2 tablets (325-650 mg total) by mouth every 4 (four) hours as needed for mild pain. 10/03/20   Love, Renay Carota, PA-C  aspirin  EC 81 MG tablet Take 1 tablet (81 mg total) by mouth daily. Swallow whole. 12/27/20   Eilleen Grates, MD  atorvastatin  (LIPITOR )  20 MG tablet Take 1 tablet by mouth daily. 07/06/20   [provider]  b complex vitamins tablet Take 1 tablet by mouth daily.    [provider]  Cholecalciferol (D3 PO) Take 2,000 Units by mouth daily.    [provider]  colchicine  0.6 MG tablet Take 0.6 mg by mouth daily as needed (gout flare).    [provider]  gabapentin  (NEURONTIN ) 300 MG capsule Take 2 capsules (600 mg total) by mouth 2 (two)  times daily. 10/03/20 06/25/23  Love, Renay Carota, PA-C  glipiZIDE  (GLUCOTROL ) 5 MG tablet Take 5 mg by mouth daily. 01/22/22   [provider]  [facility] FINEPOINT LANCETS MISC Use to check blood sugar 3 time(s) daily 10/06/17   [provider]  lisinopril  (ZESTRIL ) 10 MG tablet Take 1 tablet (10 mg total) by mouth daily. 02/13/22   Justina Oman, MD  meclizine  (ANTIVERT ) 25 MG tablet Take 1 tablet (25 mg total) by mouth 3 (three) times daily as needed for dizziness. 02/12/22   Justina Oman, MD  metFORMIN  (GLUCOPHAGE ) 850 MG tablet Take 1 tablet (850 mg total) by mouth 2 (two) times daily with a meal. 10/03/20   Love, Renay Carota, PA-C  metoprolol  succinate (TOPROL -XL) 50 MG 24 hr tablet Take 1 tablet (50 mg total) by mouth daily. With or immediately following a meal 07/18/23   Eilleen Grates, MD  Omega-3 Fatty Acids (FISH OIL) 1000 MG CAPS Take 1,000 mg by mouth 2 (two) times daily.     [provider]  omeprazole (PRILOSEC) 20 MG capsule Take 20 mg by mouth daily.  10/02/16   [provider]  tamsulosin  (FLOMAX ) 0.4 MG CAPS capsule Take 0.4 mg by mouth at bedtime. 01/29/22   [provider]  vitamin B-12 (CYANOCOBALAMIN ) 1000 MCG tablet Take 1 tablet (1,000 mcg total) by mouth daily. 02/12/22   Justina Oman, MD    Family History Family History  Problem Relation Age of Onset   Heart attack Father 48   Colon cancer Brother 22    Social History Social History   Tobacco Use   Smoking status: Former    Current packs/day: 0.00    Average packs/day: 5.0 packs/day for 36.0 years (180.0 ttl pk-yrs)    Types: Cigarettes    Start date: 11/24/1952    Quit date: 11/24/1988    Years since quitting: 35.3    Passive exposure: Never   Smokeless tobacco: Current    Types: Snuff  Vaping Use   Vaping status: Never Used  Substance Use Topics   Alcohol use: No    Alcohol/week: 0.0 standard drinks of alcohol   Drug use: No     Allergies   Simvastatin   Review  of Systems Review of Systems Per HPI  Physical Exam Triage Vital Signs ED Triage Vitals  Encounter Vitals Group     BP 03/06/24 1415 111/69     Systolic BP Percentile --      Diastolic BP Percentile --      Pulse Rate 03/06/24 1415 94     Resp 03/06/24 1415 (!) 26     Temp --      Temp Source 03/06/24 1415 Oral     SpO2 03/06/24 1415 94 %     Weight --      Height --      Head Circumference --      Peak Flow --      Pain Score 03/06/24 1416 8  Pain Loc --      Pain Education --      Exclude from Growth Chart --    No data found.  Updated Vital Signs BP 111/69 (BP Location: Right Arm)   Pulse 94   Resp (!) 26   SpO2 94%   Visual Acuity Right Eye Distance:   Left Eye Distance:   Bilateral Distance:    Right Eye Near:   Left Eye Near:    Bilateral Near:     Physical Exam Vitals and nursing note reviewed.  Constitutional:      General: He is not in acute distress.    Appearance: Normal appearance.  HENT:     Head: Normocephalic.     Right Ear: Tympanic membrane, ear canal and external ear normal.     Left Ear: Tympanic membrane, ear canal and external ear normal.     Nose: Congestion present.     Right Turbinates: Enlarged and swollen.     Left Turbinates: Enlarged and swollen.     Right Sinus: No maxillary sinus tenderness or frontal sinus tenderness.     Left Sinus: No maxillary sinus tenderness or frontal sinus tenderness.     Mouth/Throat:     Mouth: Mucous membranes are moist.  Eyes:     Extraocular Movements: Extraocular movements intact.     Conjunctiva/sclera: Conjunctivae normal.     Pupils: Pupils are equal, round, and reactive to light.  Cardiovascular:     Rate and Rhythm: Normal rate and regular rhythm.     Pulses: Normal pulses.     Heart sounds: Normal heart sounds.  Pulmonary:     Effort: Pulmonary effort is normal. No respiratory distress.     Breath sounds: Normal breath sounds. No stridor. No wheezing, rhonchi or rales.   Abdominal:     General: Bowel sounds are normal.     Palpations: Abdomen is soft.     Tenderness: There is no abdominal tenderness.  Musculoskeletal:     Cervical back: Normal range of motion.  Skin:    General: Skin is warm and dry.  Neurological:     General: No focal deficit present.     Mental Status: He is alert and oriented to person, place, and time.  Psychiatric:        Mood and Affect: Mood normal.        Behavior: Behavior normal.   Trial ambulation was performed to assess shortness of breath.  Sats maintained, increased to 97% during ambulation.  Respiratory rate remained even, nonlabored.   UC Treatments / Results  Labs (all labs ordered are listed, but only abnormal results are displayed) Labs Reviewed  POCT FASTING CBG [facility] MANUAL ENTRY - Abnormal; Notable for the following components:      Result Value   POCT Glucose ([facility]) 109 (*)    All other components within normal limits    EKG   Radiology DG Chest 2 View Result Date: 03/06/2024 CLINICAL DATA:  Cough, short of breath for 5 days EXAM: CHEST - 2 VIEW COMPARISON:  11/17/2016 FINDINGS: Frontal and lateral views of the chest demonstrate an unremarkable cardiac silhouette. No acute airspace disease, effusion, or pneumothorax. No acute bony abnormalities. IMPRESSION: 1. No acute intrathoracic process. Electronically Signed   By: Bobbye Burrow M.D.   On: 03/06/2024 15:47    Procedures Procedures (including critical care time)  Medications Ordered in UC Medications - No data to display  Initial Impression / Assessment and Plan / UC Course  I have reviewed the triage vital signs and the nursing notes.  Pertinent labs & imaging results that were available during my care of the patient were reviewed by me and considered in my medical decision making (see chart for details).  On exam, lung sounds are clear throughout, initial room air sats at 94%.  Patient is speaking in complete sentences, and is in no acute  distress.  Trial ambulation was also performed, sats maintained at 97% on room air.  Chest x-ray was also negative for active cardiopulmonary disease.  Will treat for acute upper respiratory infection with Augmentin 875/125 mg tablets twice daily, along with fluticasone 50 mcg nasal spray for nasal congestion and runny nose.  Patient's blood sugar remained stable during his visit today.  Supportive care recommendations were provided and discussed with the patient and family to include fluids, rest, continuing over-the-counter Tylenol , and use of normal saline nasal spray.  Patient and son were given ER follow-up precautions, advised to follow-up with patient's PCP this week for reevaluation.  Patient and son were in agreement with this plan of care and verbalized understanding.  All questions were answered.  Patient stable for discharge.  Final Clinical Impressions(s) / UC Diagnoses   Final diagnoses:  Cough, unspecified type  Acute upper respiratory infection     Discharge Instructions      The chest x-ray was negative for pneumonia. Take medication as prescribed. Increase fluids and allow for plenty of rest. May use normal saline nasal spray throughout the day for nasal congestion and runny nose. For the cough, recommend over-the-counter Robitussin or you may continue Mucinex  for the cough.  You may also use Delsym. Recommend use of a humidifier in the bedroom at nighttime during sleep to help with cough and nasal congestion. If symptoms fail to improve with this treatment, please follow-up with your primary care physician for further evaluation. Follow-up as needed.     ED Prescriptions     Medication Sig Dispense Auth. Provider   fluticasone (FLONASE) 50 MCG/ACT nasal spray Place 2 sprays into both nostrils daily. 16 g Leath-Warren, Belen Bowers, NP   amoxicillin-clavulanate (AUGMENTIN) 875-125 MG tablet Take 1 tablet by mouth every 12 (twelve) hours for 5 days. 10 tablet Leath-Warren,  Belen Bowers, NP      PDMP not reviewed this encounter.   Hardy Lia, NP 03/06/24 [phone]

## 2024-07-28 ENCOUNTER — Other Ambulatory Visit: Payer: Self-pay | Admitting: Cardiology

## 2024-08-15 NOTE — Progress Notes (Unsigned)
 Cardiology Office Note:   Date:  08/18/2024  ID:  [name] [name]., DOB [birth year], MRN [id number] PCP: [name] [name] LABOR, MD  [facility] Providers Cardiologist:  [name] Schilling, MD {  History of Present Illness:   [name] [name]. is a 78 y.o. male with a PMH of CAD s/p angioplasty in early 1990s, HTN, HLD, mild carotid artery disease, DM type 2,who previously saw Dr. Charls.  His had an ischemic evaluation with a NST 11/2018 which was a low risk study with a medium defect of moderate severity in the mid-inferoseptal and mid-inferior location which was felt to be 2/2 soft tissue attenuation; EF 54%. 12/2019.   He was admitted to [facility] from 07/04/20-07/06/20 after presenting with acute left arm weakness. CT Head and MRI brain were negative for acute findings. Bilateral carotid dopplers showed mild bilateral carotid artery disease. Echo showed EF 55-60%, G1DD, mild to moderate LVH, mild LAE, negative bubble study, and no significant valvular abnormalities. 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  He was started on low dose atorvastatin  in addition to home fenofibrate for elevated triglycerides. He was subsequently seen in the ED 07/15/20 for similar complaints of left arm weakness with possible facial numbness. Code stroke activated and he underwent a CT head and MRI brain which were again negative.   He had redo spine surgery.   At the last visit he had dizziness and wore a monitor.  He had brief runs of SVT.  There was no clear etiology for his dizziness.   Since I last saw him he has had increased shortness of breath with physical activity.  He lost his wife suddenly.  She was medication.  He has been very depressed.  He is going to move out of the house and go to a retirement community.  His kids again are moving to the house.  He has lost a lot of his desire to do  things so he has been relatively sedentary though he still does some yard work.  With mild physical activity he has been getting increasing shortness of breath.  He has not been having any of the chest discomfort.  He denies any PND or orthopnea.  He is had no new palpitations, presyncope or syncope.  He has residual right sided weakness and walks with a cane.  He has numbness on his right side.  ROS: As stated in the HPI and negative for all other systems.  And there are so right there think it was behind of no coming up but who is here I saw on the yeah  Studies Reviewed:    EKG:   EKG Interpretation Date/Time:  Wednesday August 18 2024 10:22:40 EDT Ventricular Rate:  84 PR Interval:  152 QRS Duration:  140 QT Interval:  398 QTC Calculation: 470 R Axis:   -87  Text Interpretation: Normal sinus rhythm Right bundle branch block Left anterior fascicular block Bifascicular block When compared with ECG of 10-Feb-2022 23:23, No significant change since last tracing Confirmed by [name] [name] ([id number]) on 08/18/2024 10:57:13 AM   This is a harmless and started hide  Risk Assessment/Calculations:              Physical Exam:   VS:  BP 138/70   Pulse 84   Ht 5' 10 (1.778 m)   Wt 231 lb (104.8 kg)   BMI 33.15 kg/m    Wt  Readings from Last 3 Encounters:  08/18/24 231 lb (104.8 kg)  06/25/23 221 lb (100.2 kg)  02/11/22 224 lb 10.4 oz (101.9 kg)     GEN: Well nourished, well developed in no acute distress NECK: No JVD; No carotid bruits CARDIAC: RRR, no murmurs, rubs, gallops RESPIRATORY:  Clear to auscultation without rales, wheezing or rhonchi  ABDOMEN: Soft, non-tender, non-distended EXTREMITIES:  No edema; No deformity   ASSESSMENT AND PLAN:   CAD s/p remote angioplasty:   The patient has new shortness of breath.  I will consider this an anginal equivalent.  He needs further stress testing but would not be able to walk on a treadmill.  I will send him for PET  scanning.  Presyncope: He had this previously.  He had no significant arrhythmias as noted.  He is not having this symptom any longer.  No change in therapy.   HTN:    The blood pressure is at target.  No change in therapy.   HLD: LDL was in the 40s.  No change in therapy.    Carotid artery disease: This was mild in 2021.  No further imaging.   DM type 2:   A1c was at target and followed by his primary provider.        Follow up with me in 6 months or sooner.  Signed, [name] Schilling, MD

## 2024-08-18 ENCOUNTER — Encounter: Payer: Self-pay | Admitting: Cardiology

## 2024-08-18 ENCOUNTER — Ambulatory Visit (INDEPENDENT_AMBULATORY_CARE_PROVIDER_SITE_OTHER): Admitting: Cardiology

## 2024-08-18 VITALS — BP 138/70 | HR 84 | Ht 70.0 in | Wt 231.0 lb

## 2024-08-18 DIAGNOSIS — Z9861 Coronary angioplasty status: Secondary | ICD-10-CM | POA: Diagnosis not present

## 2024-08-18 DIAGNOSIS — I10 Essential (primary) hypertension: Secondary | ICD-10-CM | POA: Diagnosis not present

## 2024-08-18 DIAGNOSIS — I2510 Atherosclerotic heart disease of native coronary artery without angina pectoris: Secondary | ICD-10-CM | POA: Diagnosis not present

## 2024-08-18 DIAGNOSIS — E785 Hyperlipidemia, unspecified: Secondary | ICD-10-CM

## 2024-08-18 DIAGNOSIS — R42 Dizziness and giddiness: Secondary | ICD-10-CM

## 2024-08-18 DIAGNOSIS — R0602 Shortness of breath: Secondary | ICD-10-CM | POA: Diagnosis not present

## 2024-08-18 NOTE — Patient Instructions (Addendum)
 Medication Instructions:  Your physician recommends that you continue on your current medications as directed. Please refer to the Current Medication list given to you today.  Labwork: none  Testing/Procedures: Cardiac PET. Please see instructions below.  Follow-Up: Your physician recommends that you schedule a follow-up appointment in: 6 months  Any Other Special Instructions Will Be Listed Below (If Applicable).  If you need a refill on your cardiac medications before your next appointment, please call your pharmacy.     Please report to Radiology at the [facility] Main Entrance 30 minutes early for your test.  [address] [place], [place] [address]                          How to Prepare for Your Cardiac PET/CT Stress Test:  Nothing to eat or drink, except water, 3 hours prior to arrival time.  NO caffeine/decaffeinated products, or chocolate 12 hours prior to arrival. (Please note decaffeinated beverages (teas/coffees) still contain caffeine).  If you have caffeine within 12 hours prior, the test will need to be rescheduled.  Medication instructions: Do not take erectile dysfunction medications for 72 hours prior to test (sildenafil, tadalafil) Do not take nitrates (isosorbide  mononitrate, Ranexa) the day before or day of test Do not take tamsulosin  the day before or morning of test Hold theophylline containing medications for 12 hours. Hold Dipyridamole 48 hours prior to the test.  Diabetic Preparation: If able to eat breakfast prior to 3 hour fasting, you may take all medications, including your insulin . Do not worry if you miss your breakfast dose of insulin  - start at your next meal. If you do not eat prior to 3 hour fast-Hold all diabetes (oral and insulin ) medications. Patients who wear a continuous glucose monitor MUST remove the device prior to scanning.  You may take your remaining medications with water.  NO perfume, cologne or lotion on chest  or abdomen area.  Total time is 1 to 2 hours; you may want to bring reading material for the waiting time.  IF YOU THINK YOU MAY BE PREGNANT, OR ARE NURSING PLEASE INFORM THE TECHNOLOGIST.  In preparation for your appointment, medication and supplies will be purchased.  Appointment availability is limited, so if you need to cancel or reschedule, please call the Radiology Department Scheduler at [phone] 24 hours in advance to avoid a cancellation fee of $100.00  What to Expect When you Arrive:  Once you arrive and check in for your appointment, you will be taken to a preparation room within the Radiology Department.  A technologist or Nurse will obtain your medical history, verify that you are correctly prepped for the exam, and explain the procedure.  Afterwards, an IV will be started in your arm and electrodes will be placed on your skin for EKG monitoring during the stress portion of the exam. Then you will be escorted to the PET/CT scanner.  There, staff will get you positioned on the scanner and obtain a blood pressure and EKG.  During the exam, you will continue to be connected to the EKG and blood pressure machines.  A small, safe amount of a radioactive tracer will be injected in your IV to obtain a series of pictures of your heart along with an injection of a stress agent.    After your Exam:  It is recommended that you eat a meal and drink a caffeinated beverage to counter act any effects of the stress  agent.  Drink plenty of fluids for the remainder of the day and urinate frequently for the first couple of hours after the exam.  Your doctor will inform you of your test results within 7-10 business days.  For more information and frequently asked questions, please visit our website: [email or web address]  For questions about your test or how to prepare for your test, please call: Cardiac Imaging Nurse Navigators Office: [phone]

## 2024-09-13 ENCOUNTER — Encounter (HOSPITAL_COMMUNITY): Payer: Self-pay

## 2024-09-15 ENCOUNTER — Ambulatory Visit (HOSPITAL_COMMUNITY)
Admission: RE | Admit: 2024-09-15 | Discharge: 2024-09-15 | Disposition: A | Discharge status: Home / Self Care | Source: Ambulatory Visit | Attending: Cardiology | Admitting: Cardiology

## 2024-09-15 ENCOUNTER — Ambulatory Visit: Payer: Self-pay | Admitting: Cardiology

## 2024-09-15 DIAGNOSIS — Z01812 Encounter for preprocedural laboratory examination: Secondary | ICD-10-CM

## 2024-09-15 DIAGNOSIS — R0602 Shortness of breath: Secondary | ICD-10-CM | POA: Insufficient documentation

## 2024-09-15 LAB — NM PET CT CARDIAC PERFUSION MULTI W/ABSOLUTE BLOODFLOW
Base ST Depression (mm): 1 mm
LV dias vol: 139 mL (ref 62–150)
LV sys vol: 85 mL (ref 4.2–5.8)
MBFR: 0.91
Nuc Rest EF: 39 %
Nuc Stress EF: 40 %
Peak HR: 84 {beats}/min
Rest HR: 68 {beats}/min
Rest MBF: 0.89 ml/g/min
Rest Nuclear Isotope Dose: 27.1 mCi
Rest perfusion cavity size (mL): 139 mL
SRS: 12
SSS: 55
ST Depression (mm): 0.5 mm
Stress MBF: 0.81 ml/g/min
Stress Nuclear Isotope Dose: 27.1 mCi
Stress perfusion cavity size (mL): 168 mL
TID: 1.21

## 2024-09-15 MED ORDER — REGADENOSON 0.4 MG/5ML IV SOLN
INTRAVENOUS | Status: AC
Start: 2024-09-15 — End: 2024-09-15
  Filled 2024-09-15: qty 5

## 2024-09-15 MED ORDER — RUBIDIUM RB82 GENERATOR (RUBYFILL)
27.1100 | PACK | Freq: Once | INTRAVENOUS | Status: AC
  Administered 2024-09-15: 27.11 via INTRAVENOUS

## 2024-09-15 MED ORDER — REGADENOSON 0.4 MG/5ML IV SOLN
0.4000 mg | Freq: Once | INTRAVENOUS | Status: AC
  Administered 2024-09-15: 0.4 mg via INTRAVENOUS

## 2024-09-15 MED ORDER — RUBIDIUM RB82 GENERATOR (RUBYFILL)
27.1300 | PACK | Freq: Once | INTRAVENOUS | Status: AC
  Administered 2024-09-15: 27.13 via INTRAVENOUS

## 2024-09-15 NOTE — Progress Notes (Signed)
 Pt tolerated lexiscan . Endorsed SOB during scan, resolved by end. Given PO caffeine.

## 2024-09-16 ENCOUNTER — Telehealth: Payer: Self-pay | Admitting: *Deleted

## 2024-09-16 NOTE — Telephone Encounter (Signed)
 Spoke with pt regarding a cath. Pt scheduled for Monday 11/10 with Dr. Mady. Pt aware. Instructions sent via MyChart per patient's request. Pt stated his PCP put him back on Metformin . The medication is not on his list, however cath instructions will indicate to hold day of procedure and 48 hours after. Labs ordered. Pt to get those drawn today 11/6. Pt verbalized understanding. All questions if any were answered.

## 2024-09-16 NOTE — Telephone Encounter (Addendum)
 Cardiac Catheterization scheduled at [facility] for: Monday September 20, 2024 7:30 AM Arrival time [facility] Main Entrance A at: 5:30 AM  Diet: -Nothing to eat after midnight.  Hydration: -May drink clear liquids until 2 hours before the procedure.  Approved liquids: Water, clear tea, black coffee, fruit juices-non-citric and without pulp,Gatorade, plain Jello/popsicles.   -Please drink 16 oz of water 2 hours before procedure.  Medication instructions: -Hold:  Metformin -day of procedure and 48 hours post procedure  Glipizide -AM of procedure -Other usual morning medications can be taken including aspirin  81 mg.  Plan to go home the same day, you will only stay overnight if medically necessary.  You must have responsible adult to drive you home.  Someone must be with you the first 24 hours after you arrive home.  Reviewed procedure instructions with patient.

## 2024-09-16 NOTE — Telephone Encounter (Signed)
-----   Message from [name] sent at 09/15/2024  8:19 PM EST ----- I spoke with the patient.  He needs a cardiac cath and I am hoping to get that done this Friday.  The patient understands that risks included but are not limited to stroke (1 in 1000), death (1 in  1000), kidney failure [usually temporary] (1 in 500), bleeding (1 in 200), allergic reaction [possibly serious] (1 in 200).  The patient understands and agrees to proceed.     ----- Message ----- From: Interface, Rad Results In Sent: 09/15/2024  10:02 AM EST To: [name], MD

## 2024-09-17 LAB — CBC
Hematocrit: 39.3 % (ref 37.5–51.0)
Hemoglobin: 12.6 g/dL — ABNORMAL LOW (ref 13.0–17.7)
MCH: 28.8 pg (ref 26.6–33.0)
MCHC: 32.1 g/dL (ref 31.5–35.7)
MCV: 90 fL (ref 79–97)
Platelets: 258 x10E3/uL (ref 150–450)
RBC: 4.37 x10E6/uL (ref 4.14–5.80)
RDW: 12.5 % (ref 11.6–15.4)
WBC: 8.3 x10E3/uL (ref 3.4–10.8)

## 2024-09-17 LAB — BASIC METABOLIC PANEL WITH GFR
BUN/Creatinine Ratio: 11 (ref 10–24)
BUN: 9 mg/dL (ref 8–27)
CO2: 23 mmol/L (ref 20–29)
Calcium: 9.2 mg/dL (ref 8.6–10.2)
Chloride: 97 mmol/L (ref 96–106)
Creatinine, Ser: 0.8 mg/dL (ref 0.76–1.27)
Glucose: 237 mg/dL — ABNORMAL HIGH (ref 70–99)
Potassium: 4 mmol/L (ref 3.5–5.2)
Sodium: 136 mmol/L (ref 134–144)
eGFR: 91 mL/min/1.73 (ref >59)

## 2024-09-20 ENCOUNTER — Encounter (HOSPITAL_COMMUNITY)
Admission: RE | Disposition: A | Discharge status: Home / Self Care | Payer: Self-pay | Source: Home / Self Care | Attending: Internal Medicine

## 2024-09-20 ENCOUNTER — Ambulatory Visit (HOSPITAL_COMMUNITY)
Admission: RE | Admit: 2024-09-20 | Discharge: 2024-09-20 | Disposition: A | Discharge status: Home / Self Care | Attending: Internal Medicine | Admitting: Internal Medicine

## 2024-09-20 ENCOUNTER — Other Ambulatory Visit: Payer: Self-pay

## 2024-09-20 DIAGNOSIS — Z79899 Other long term (current) drug therapy: Secondary | ICD-10-CM | POA: Insufficient documentation

## 2024-09-20 DIAGNOSIS — E7849 Other hyperlipidemia: Secondary | ICD-10-CM | POA: Diagnosis not present

## 2024-09-20 DIAGNOSIS — Z7984 Long term (current) use of oral hypoglycemic drugs: Secondary | ICD-10-CM | POA: Insufficient documentation

## 2024-09-20 DIAGNOSIS — I25110 Atherosclerotic heart disease of native coronary artery with unstable angina pectoris: Secondary | ICD-10-CM | POA: Insufficient documentation

## 2024-09-20 DIAGNOSIS — E1169 Type 2 diabetes mellitus with other specified complication: Secondary | ICD-10-CM | POA: Diagnosis not present

## 2024-09-20 DIAGNOSIS — F17220 Nicotine dependence, chewing tobacco, uncomplicated: Secondary | ICD-10-CM | POA: Insufficient documentation

## 2024-09-20 DIAGNOSIS — I2582 Chronic total occlusion of coronary artery: Secondary | ICD-10-CM | POA: Insufficient documentation

## 2024-09-20 DIAGNOSIS — I200 Unstable angina: Secondary | ICD-10-CM | POA: Diagnosis present

## 2024-09-20 HISTORY — PX: LEFT HEART CATH AND CORONARY ANGIOGRAPHY: CATH118249

## 2024-09-20 HISTORY — PX: CORONARY PRESSURE/FFR WITH 3D MAPPING: CATH118309

## 2024-09-20 LAB — GLUCOSE, CAPILLARY
Glucose-Capillary: 228 mg/dL — ABNORMAL HIGH (ref 70–99)
Glucose-Capillary: 205 mg/dL — ABNORMAL HIGH (ref 70–99)

## 2024-09-20 SURGERY — LEFT HEART CATH AND CORONARY ANGIOGRAPHY
Anesthesia: LOCAL

## 2024-09-20 MED ORDER — FREE WATER: 500.0000 mL | Freq: Once | Status: DC

## 2024-09-20 MED ORDER — MIDAZOLAM HCL (PF) 2 MG/2ML IJ SOLN
INTRAMUSCULAR | Status: DC | PRN
  Administered 2024-09-20 (×2): 1 mg via INTRAVENOUS

## 2024-09-20 MED ORDER — HEPARIN SODIUM (PORCINE) 1000 UNIT/ML IJ SOLN
INTRAMUSCULAR | Status: DC | PRN
  Administered 2024-09-20: 5000 [IU] via INTRAVENOUS

## 2024-09-20 MED ORDER — LIDOCAINE HCL (PF) 1 % IJ SOLN
INTRAMUSCULAR | Status: AC
  Filled 2024-09-20: qty 30

## 2024-09-20 MED ORDER — IOHEXOL 350 MG/ML SOLN
INTRAVENOUS | Status: DC | PRN
  Administered 2024-09-20: 56 mL

## 2024-09-20 MED ORDER — HYDRALAZINE HCL 20 MG/ML IJ SOLN: 10.0000 mg | INTRAMUSCULAR | Status: DC | PRN

## 2024-09-20 MED ORDER — ISOSORBIDE MONONITRATE ER 30 MG PO TB24: 30.0000 mg | ORAL_TABLET | Freq: Every day | ORAL | 11 refills | Status: DC

## 2024-09-20 MED ORDER — FENTANYL CITRATE (PF) 100 MCG/2ML IJ SOLN
INTRAMUSCULAR | Status: DC | PRN
  Administered 2024-09-20 (×2): 25 ug via INTRAVENOUS

## 2024-09-20 MED ORDER — VERAPAMIL HCL 2.5 MG/ML IV SOLN
INTRAVENOUS | Status: AC
  Filled 2024-09-20: qty 2

## 2024-09-20 MED ORDER — SODIUM CHLORIDE 0.9% FLUSH: 3.0000 mL | INTRAVENOUS | Status: DC | PRN

## 2024-09-20 MED ORDER — SODIUM CHLORIDE 0.9 % IV SOLN
250.0000 mL | INTRAVENOUS | Status: DC | PRN
Start: 2024-09-20 — End: 2024-09-20

## 2024-09-20 MED ORDER — HEPARIN (PORCINE) IN NACL 1000-0.9 UT/500ML-% IV SOLN
INTRAVENOUS | Status: DC | PRN
  Administered 2024-09-20: 1000 mL

## 2024-09-20 MED ORDER — LABETALOL HCL 5 MG/ML IV SOLN: 10.0000 mg | INTRAVENOUS | Status: DC | PRN

## 2024-09-20 MED ORDER — SODIUM CHLORIDE 0.9% FLUSH: 3.0000 mL | Freq: Two times a day (BID) | INTRAVENOUS | Status: DC

## 2024-09-20 MED ORDER — MIDAZOLAM HCL 2 MG/2ML IJ SOLN
INTRAMUSCULAR | Status: AC
  Filled 2024-09-20: qty 2

## 2024-09-20 MED ORDER — NITROGLYCERIN 0.4 MG SL SUBL
0.4000 mg | SUBLINGUAL_TABLET | SUBLINGUAL | 99 refills | Status: AC | PRN
Start: 2024-09-20 — End: 2025-09-20

## 2024-09-20 MED ORDER — ASPIRIN 81 MG PO CHEW: 81.0000 mg | CHEWABLE_TABLET | ORAL | Status: AC

## 2024-09-20 MED ORDER — LIDOCAINE HCL (PF) 1 % IJ SOLN
INTRAMUSCULAR | Status: DC | PRN
  Administered 2024-09-20: 2 mL via INTRADERMAL

## 2024-09-20 MED ORDER — SODIUM CHLORIDE 0.9 % IV SOLN: 250.0000 mL | INTRAVENOUS | Status: DC | PRN

## 2024-09-20 MED ORDER — ACETAMINOPHEN 325 MG PO TABS: 650.0000 mg | ORAL_TABLET | ORAL | Status: DC | PRN

## 2024-09-20 MED ORDER — ONDANSETRON HCL 4 MG/2ML IJ SOLN: 4.0000 mg | Freq: Four times a day (QID) | INTRAMUSCULAR | Status: DC | PRN

## 2024-09-20 MED ORDER — VERAPAMIL HCL 2.5 MG/ML IV SOLN
INTRAVENOUS | Status: DC | PRN
  Administered 2024-09-20: 10 mL via INTRA_ARTERIAL

## 2024-09-20 MED ORDER — HEPARIN SODIUM (PORCINE) 1000 UNIT/ML IJ SOLN
INTRAMUSCULAR | Status: AC
  Filled 2024-09-20: qty 10

## 2024-09-20 MED ORDER — FENTANYL CITRATE (PF) 100 MCG/2ML IJ SOLN
INTRAMUSCULAR | Status: AC
  Filled 2024-09-20: qty 2

## 2024-09-20 SURGICAL SUPPLY — 8 items
CARD KEY FFR CATHWORX (MISCELLANEOUS) IMPLANT
CATH 5FR JL3.5 JR4 ANG PIG MP (CATHETERS) IMPLANT
DEVICE RAD COMP TR BAND LRG (VASCULAR PRODUCTS) IMPLANT
GLIDESHEATH SLEND SS 6F .021 (SHEATH) IMPLANT
GUIDEWIRE INQWIRE 1.5J.035X260 (WIRE) IMPLANT
KIT SYRINGE INJ CVI SPIKEX1 (MISCELLANEOUS) IMPLANT
PACK CARDIAC CATHETERIZATION (CUSTOM PROCEDURE TRAY) ×2 IMPLANT
SET ATX-X65L (MISCELLANEOUS) IMPLANT

## 2024-09-20 NOTE — H&P (Signed)
 Cardiology Admission History and Physical   Patient ID: [name] [name]. MRN: [id number]; DOB: 05-Mar-1946   Admission date: 09/20/2024  PCP:  [name] LABOR, MD   [facility] Providers Cardiologist:  [name], MD       Chief Complaint: Shortness of breath and abnormal stress test  Patient Profile: [name] [name]. is a 78 y.o. male with history of CAD status post PTCA in the early 90s, HTN, HLD, type II DM, and mild carotid artery stenosis, who is being seen 09/20/2024 for the evaluation of shortness of breath, chest pain, and abnormal stress test.  History of Present Illness: Mr. Beem was seen by Dr. Schilling last month, at which time he complained of increasing shortness of breath over the last 6 to 8 months.  He also notes intermittent chest pain associated with this.  It is most pronounced when he is walking, such as to his mailbox which is about 400 yards from his home.  He has not had any chest pain at rest but feels short of breath lying on the gurney today.  He denies edema.  He underwent myocardial PET/CT last week, which was high risk with findings suggestive of multivessel CAD, particularly in the LCx distribution.  Global myocardial blood flow reserve was highly abnormal.  LVEF was also mildly-moderately reduced   Past Medical History:  Diagnosis Date   CAD (coronary artery disease)    Cancer (HCC)    Diabetes (HCC)    Dyslipidemia    Gout    History of kidney stones    HTN (hypertension)    Myocardial infarction [facility])    Past Surgical History:  Procedure Laterality Date   ANTERIOR CERVICAL DECOMP/DISCECTOMY FUSION Left 10/20/2019   Procedure: Left Cervical Three-Four Cervical Four-Five Cervical Five-Six Anterior cervical decompression/discectomy/fusion;  Surgeon: Cheryle Debby LABOR, MD;  Location: [facility] OR;  Service: Neurosurgery;  Laterality: Left;  Left Cervical Three-Four Cervical Four-Five Cervical Five-Six Anterior cervical  decompression/discectomy/fusion   BACK SURGERY     left knee surgery     left shoulder surgery     POSTERIOR CERVICAL FUSION/FORAMINOTOMY N/A 09/17/2020   Procedure: CERVICAL THREE TO CERVICAL SIX POSTERIOR CERVICAL DECOMPRESSION;  Surgeon: Cheryle Debby LABOR, MD;  Location: [facility] OR;  Service: Neurosurgery;  Laterality: N/A;   right knee surgery       Medications Prior to Admission: Prior to Admission medications   Medication Sig Start Date Audwin Semper Date Taking? Authorizing Provider  allopurinol (ZYLOPRIM) 100 MG tablet Take 100 mg by mouth daily.   Yes [provider]  amLODipine  (NORVASC ) 5 MG tablet Take 5 mg by mouth daily.   Yes [provider]  aspirin  EC 81 MG tablet Take 1 tablet (81 mg total) by mouth daily. Swallow whole. 12/27/20  Yes Schilling Lynwood, MD  atorvastatin  (LIPITOR ) 20 MG tablet Take 1 tablet by mouth daily. 07/06/20  Yes [provider]  b complex vitamins tablet Take 1 tablet by mouth daily.   Yes [provider]  Cholecalciferol 50 MCG (2000 UT) TABS Take 2,000 Units by mouth daily.   Yes [provider]  finasteride (PROSCAR) 5 MG tablet Take 5 mg by mouth daily.   Yes [provider]  fluticasone  (FLONASE ) 50 MCG/ACT nasal spray Place 2 sprays into both nostrils daily. Patient taking differently: Place 2 sprays into both nostrils daily as needed (Congestion). 03/06/24  Yes Leath-Warren, Etta PARAS, NP  glipiZIDE  (GLUCOTROL ) 5 MG tablet Take 5 mg by mouth daily.  01/22/22  Yes [provider]  metFORMIN  (GLUCOPHAGE ) 500 MG tablet Take by mouth 2 (two) times daily with a meal. 09/16/24  Yes Lavona Agent, MD  metoprolol  succinate (TOPROL -XL) 50 MG 24 hr tablet Take 1 tablet (50 mg total) by mouth daily. With or immediately following a meal 07/29/24  Yes Hochrein, Agent, MD  Omega-3 Fatty Acids (FISH OIL) 1000 MG CAPS Take 1,000 mg by mouth 2 (two) times daily.    Yes [provider]  omeprazole (PRILOSEC)  20 MG capsule Take 20 mg by mouth daily.  10/02/16  Yes [provider]  tamsulosin  (FLOMAX ) 0.4 MG CAPS capsule Take 0.4 mg by mouth at bedtime. 01/29/22  Yes [provider]  vitamin B-12 (CYANOCOBALAMIN ) 1000 MCG tablet Take 1 tablet (1,000 mcg total) by mouth daily. 02/12/22  Yes Ricky Fines, MD  colchicine  0.6 MG tablet Take 0.6 mg by mouth daily as needed (gout flare).    [provider]  [facility] FINEPOINT LANCETS MISC Use to check blood sugar 3 time(s) daily 10/06/17   [provider]  meclizine  (ANTIVERT ) 25 MG tablet Take 1 tablet (25 mg total) by mouth 3 (three) times daily as needed for dizziness. 02/12/22   Ricky Fines, MD     Allergies:    Allergies  Allergen Reactions   Simvastatin Other (See Comments)    dizziness    Social History:   Social History   Tobacco Use   Smoking status: Former    Current packs/day: 0.00    Average packs/day: 5.0 packs/day for 36.0 years (180.0 ttl pk-yrs)    Types: Cigarettes    Start date: 11/24/1952    Quit date: 11/24/1988    Years since quitting: 35.8    Passive exposure: Never   Smokeless tobacco: Current    Types: Snuff  Vaping Use   Vaping status: Never Used  Substance Use Topics   Alcohol use: No    Alcohol/week: 0.0 standard drinks of alcohol   Drug use: No     Family History:   The patient's family history includes Colon cancer (age of onset: 5) in his brother; Heart attack (age of onset: 29) in his father.    ROS:  Please see the history of present illness.  All other ROS reviewed and negative.     Physical Exam/Data: Vitals:   09/20/24 0625  BP: (!) 177/84  Pulse: 77  Resp: 18  Temp: 98.1 F (36.7 C)  TempSrc: Oral  SpO2: 97%  Weight: 102.5 kg  Height: 5' 9 (1.753 m)   No intake or output data in the 24 hours ending 09/20/24 0713    09/20/2024    6:25 AM 08/18/2024   10:24 AM 06/25/2023    9:24 AM  Last 3 Weights  Weight (lbs) 226 lb 231 lb 221 lb  Weight (kg)  102.513 kg 104.781 kg 100.245 kg     Body mass index is 33.37 kg/m.  General:  Well nourished, well developed, in no acute distress HEENT: normal Neck: Difficult to assess JVP due to body habitus. Vascular: No carotid bruits; Distal pulses 2+ bilaterally   Cardiac: Regular rate and rhythm without murmurs, rubs, or gallops Lungs:  clear to auscultation bilaterally, no wheezing, rhonchi or rales  Ext: Trace pretibial edema bilaterally.  2+ right radial pulse.  EKG:  The ECG that was done today was personally reviewed and demonstrates normal sinus rhythm with PACs and bifascicular block (LAFB and RBBB).  Relevant CV Studies: Myocardial PET/CT stress  test (09/15/2024): High risk study with findings suggestive of multivessel CAD, particularly in the LCx distribution.  There is a large defect with moderate reduction in uptake in the apical to basal anterolateral and inferolateral segments that is reversible with associated wall motion abnormality.  There may be a component of scar in the mid basal lateral wall versus hibernating myocardium.  LVEF 39% at rest and 40% with stress.  Global myocardial blood flow reserve 0.91.  Laboratory Data: High Sensitivity Troponin:  No results for input(s): TROPONINIHS in the last 720 hours.    Chemistry Recent Labs  Lab 09/16/24 1040  NA 136  K 4.0  CL 97  CO2 23  GLUCOSE 237*  BUN 9  CREATININE 0.80  CALCIUM  9.2    No results for input(s): PROT, ALBUMIN, AST, ALT, ALKPHOS, BILITOT in the last 168 hours. Lipids No results for input(s): CHOL, TRIG, HDL, LABVLDL, LDLCALC, CHOLHDL in the last 168 hours. Hematology Recent Labs  Lab 09/16/24 1040  WBC 8.3  RBC 4.37  HGB 12.6*  HCT 39.3  MCV 90  MCH 28.8  MCHC 32.1  RDW 12.5  PLT 258   Thyroid  No results for input(s): TSH, FREET4 in the last 168 hours. BNPNo results for input(s): BNP, PROBNP in the last 168 hours.  DDimer No results for input(s): DDIMER in  the last 168 hours.  Radiology/Studies:  No results found.   Assessment and Plan: Coronary artery disease with accelerating angina: Mr. Massie reports 6 to 8 months of progressive exertional dyspnea and associated chest tightness.  Recent myocardial perfusion stress test was abnormal and high risk.  He reports remote PTCA in the 1990s (details not available).  He has been referred for cardiac catheterization with possible PCI.  Pending catheterization, we will continue with aspirin  and atorvastatin  for secondary prevention of CAD as well as antianginal therapy and blood pressure control with amlodipine  and metoprolol  succinate.  Further recommendations to follow catheterization.  Hyperlipidemia associated with type 2 diabetes mellitus: Continue atorvastatin  20 mg daily for now, though repeat lipid) if not done recently adequate lipid control.  Ongoing management of DM per PCP.  Informed Consent   Shared Decision Making/Informed Consent The risks [stroke (1 in 1000), death (1 in 1000), kidney failure [usually temporary] (1 in 500), bleeding (1 in 200), allergic reaction [possibly serious] (1 in 200)], benefits (diagnostic support and management of coronary artery disease) and alternatives of a cardiac catheterization were discussed in detail with Mr. Villella and he is willing to proceed.     Code Status: Full Code   For questions or updates, please contact [facility] Please consult [email or web address] for contact info under     Signed, Lonni Hanson, MD  09/20/2024 7:13 AM

## 2024-09-20 NOTE — Discharge Instructions (Signed)

## 2024-09-20 NOTE — Progress Notes (Signed)
 Discharge instructions reviewed with patient and family at bedside. Denies questions concerns. PT tolerated PO intake. Incision site remains clean dry and intact. No s/s of complications. PT escorted from the unit via wheel chair to personal vehicle.

## 2024-09-20 NOTE — Progress Notes (Signed)
 Patient and daughter was given discharge instructions. Both verbalized understanding.

## 2024-09-20 NOTE — Interval H&P Note (Signed)
 History and Physical Interval Note:  09/20/2024 7:22 AM  [name] [name].  has presented today for surgery, with the diagnosis of coronary artery disease with accelerating angina.  The various methods of treatment have been discussed with the patient and family. After consideration of risks, benefits and other options for treatment, the patient has consented to  Procedure(s): LEFT HEART CATH AND CORONARY ANGIOGRAPHY (N/A) as a surgical intervention.  The patient's history has been reviewed, patient examined, no change in status, stable for surgery.  I have reviewed the patient's chart and labs.  Questions were answered to the patient's satisfaction.    Cath Lab Visit (complete for each Cath Lab visit)  Clinical Evaluation Leading to the Procedure:   ACS: No.  Non-ACS:    Anginal Classification: CCS III  Anti-ischemic medical therapy: Maximal Therapy (2 or more classes of medications)  Non-Invasive Test Results: High-risk stress test findings: cardiac mortality >3%/year  Prior CABG: No previous CABG  Breaker Springer

## 2024-09-21 ENCOUNTER — Encounter (HOSPITAL_COMMUNITY): Payer: Self-pay | Admitting: Internal Medicine

## 2024-09-23 ENCOUNTER — Encounter: Payer: Self-pay | Admitting: Cardiology

## 2024-09-23 DIAGNOSIS — I2510 Atherosclerotic heart disease of native coronary artery without angina pectoris: Secondary | ICD-10-CM

## 2024-10-01 ENCOUNTER — Ambulatory Visit: Admitting: Thoracic Surgery (Cardiothoracic Vascular Surgery)

## 2024-10-04 ENCOUNTER — Ambulatory Visit: Admitting: Thoracic Surgery (Cardiothoracic Vascular Surgery)

## 2024-10-04 ENCOUNTER — Encounter: Admitting: Thoracic Surgery (Cardiothoracic Vascular Surgery)

## 2024-10-04 ENCOUNTER — Ambulatory Visit (HOSPITAL_COMMUNITY)
Admission: RE | Admit: 2024-10-04 | Discharge: 2024-10-04 | Disposition: A | Discharge status: Home / Self Care | Source: Ambulatory Visit | Attending: Cardiology | Admitting: Cardiology

## 2024-10-04 DIAGNOSIS — E785 Hyperlipidemia, unspecified: Secondary | ICD-10-CM | POA: Diagnosis not present

## 2024-10-04 DIAGNOSIS — I119 Hypertensive heart disease without heart failure: Secondary | ICD-10-CM | POA: Insufficient documentation

## 2024-10-04 DIAGNOSIS — E119 Type 2 diabetes mellitus without complications: Secondary | ICD-10-CM | POA: Insufficient documentation

## 2024-10-04 DIAGNOSIS — I252 Old myocardial infarction: Secondary | ICD-10-CM | POA: Insufficient documentation

## 2024-10-04 DIAGNOSIS — I2510 Atherosclerotic heart disease of native coronary artery without angina pectoris: Secondary | ICD-10-CM | POA: Diagnosis present

## 2024-10-04 DIAGNOSIS — Z87891 Personal history of nicotine dependence: Secondary | ICD-10-CM | POA: Diagnosis not present

## 2024-10-04 LAB — ECHOCARDIOGRAM COMPLETE
Area-P 1/2: 3.74 cm2
S' Lateral: 2.9 cm

## 2024-10-04 NOTE — Progress Notes (Signed)
  Echocardiogram 2D Echocardiogram has been performed.  Koleen KANDICE Popper, RDCS 10/04/2024, 10:20 AM

## 2024-10-05 ENCOUNTER — Ambulatory Visit (HOSPITAL_COMMUNITY)

## 2024-10-06 ENCOUNTER — Other Ambulatory Visit: Payer: Self-pay | Admitting: *Deleted

## 2024-10-06 ENCOUNTER — Ambulatory Visit: Attending: Surgery | Admitting: Surgery

## 2024-10-06 ENCOUNTER — Encounter: Payer: Self-pay | Admitting: Surgery

## 2024-10-06 ENCOUNTER — Encounter: Payer: Self-pay | Admitting: *Deleted

## 2024-10-06 VITALS — BP 135/65 | HR 91 | Resp 18 | Ht 69.0 in | Wt 237.0 lb

## 2024-10-06 DIAGNOSIS — I2510 Atherosclerotic heart disease of native coronary artery without angina pectoris: Secondary | ICD-10-CM

## 2024-10-06 DIAGNOSIS — Z9861 Coronary angioplasty status: Secondary | ICD-10-CM | POA: Diagnosis not present

## 2024-10-06 NOTE — Progress Notes (Signed)
 [address], [place] [address]             [phone]     Cardiothoracic Surgery Consultation  PCP is Corrington, Kip A, MD Referring Provider is End, Lonni, MD  Chief Complaint  Patient presents with   Coronary Artery Disease    Review workup    HPI:  The patient is a 78 year old gentleman with a history of diabetes, dyslipidemia, hypertension, coronary disease status post PTCA in the early 1990s, who presented with increasing shortness of breath and intermittent chest discomfort over the past 6 to 8 months.  It typically occurred when he was walking to his mailbox which is about 400 yards from his home.  He has not had any chest pain at rest but has had some shortness of breath at rest.  He reports some orthopnea.  He has had no peripheral edema.  A PET CT scan was performed which was high risk suggesting multivessel coronary disease particularly in the left circumflex distribution.  Global myocardial blood flow reserve is highly abnormal and left ventricular ejection fraction was mildly to moderately reduced.  Cardiac catheterization 09/20/2024 showed severe three-vessel coronary disease.  There were sequential proximal to distal LAD lesions up to 60 to 70% there were hemodynamically significant with a FFR at 0.76 on the proximal stenosis and 0.59 in the distal vessel.  The left circumflex was codominant with severe proximal to mid vessel disease and chronic total occlusion of a large OM 3 branch that fills late by collaterals from the left.  There is chronic total occlusion of the RCA with left-to-right collaterals.  Left ventricular ejection fraction is estimated at 50 to 55% with an LVEDP measured at 10 mmHg.  2D echocardiogram showed an ejection fraction of 60 to 65% with no regional wall motion abnormalities.  There is moderate LVH.  There is no valvular disease.  He has significant chronic disability due to prior spinal surgery with complications  resulting in persistent numbness and weakness in the right upper and lower extremity.  He uses a cane to walk. Past Medical History:  Diagnosis Date   CAD (coronary artery disease)    Cancer (HCC)    Diabetes (HCC)    Dyslipidemia    Gout    History of kidney stones    HTN (hypertension)    Myocardial infarction [facility])     Past Surgical History:  Procedure Laterality Date   ANTERIOR CERVICAL DECOMP/DISCECTOMY FUSION Left 10/20/2019   Procedure: Left Cervical Three-Four Cervical Four-Five Cervical Five-Six Anterior cervical decompression/discectomy/fusion;  Surgeon: Cheryle Debby LABOR, MD;  Location: [facility] OR;  Service: Neurosurgery;  Laterality: Left;  Left Cervical Three-Four Cervical Four-Five Cervical Five-Six Anterior cervical decompression/discectomy/fusion   BACK SURGERY     CORONARY PRESSURE/FFR WITH 3D MAPPING N/A 09/20/2024   Procedure: Coronary Pressure/FFR w/3D Mapping;  Surgeon: Mady Lonni, MD;  Location: [facility] INVASIVE CV LAB;  Service: Cardiovascular;  Laterality: N/A;   LEFT HEART CATH AND CORONARY ANGIOGRAPHY N/A 09/20/2024   Procedure: LEFT HEART CATH AND CORONARY ANGIOGRAPHY;  Surgeon: Mady Lonni, MD;  Location: [facility] INVASIVE CV LAB;  Service: Cardiovascular;  Laterality: N/A;   left knee surgery     left shoulder surgery     POSTERIOR CERVICAL FUSION/FORAMINOTOMY N/A 09/17/2020   Procedure: CERVICAL THREE TO CERVICAL SIX POSTERIOR CERVICAL DECOMPRESSION;  Surgeon: Cheryle Debby LABOR, MD;  Location: [facility] OR;  Service: Neurosurgery;  Laterality: N/A;   right knee surgery  Family History  Problem Relation Age of Onset   Heart attack Father 8   Colon cancer Brother 37    Social History Social History   Tobacco Use   Smoking status: Former    Current packs/day: 0.00    Average packs/day: 5.0 packs/day for 36.0 years (180.0 ttl pk-yrs)    Types: Cigarettes    Start date: 11/24/1952    Quit date: 11/24/1988    Years since quitting: 35.8    Passive  exposure: Never   Smokeless tobacco: Current    Types: Snuff  Vaping Use   Vaping status: Never Used  Substance Use Topics   Alcohol use: No    Alcohol/week: 0.0 standard drinks of alcohol   Drug use: No    Current Outpatient Medications  Medication Sig Dispense Refill   allopurinol (ZYLOPRIM) 100 MG tablet Take 100 mg by mouth daily.     aspirin  EC 81 MG tablet Take 1 tablet (81 mg total) by mouth daily. Swallow whole. 90 tablet 3   atorvastatin  (LIPITOR ) 20 MG tablet Take 1 tablet by mouth daily.     b complex vitamins tablet Take 1 tablet by mouth daily.     Cholecalciferol 50 MCG (2000 UT) TABS Take 2,000 Units by mouth daily.     colchicine  0.6 MG tablet Take 0.6 mg by mouth daily as needed (gout flare).     finasteride (PROSCAR) 5 MG tablet Take 5 mg by mouth daily.     fluticasone  (FLONASE ) 50 MCG/ACT nasal spray Place 2 sprays into both nostrils daily. (Patient taking differently: Place 2 sprays into both nostrils daily as needed (Congestion).) 16 g 0   glipiZIDE  (GLUCOTROL ) 5 MG tablet Take 5 mg by mouth daily.     isosorbide  mononitrate (IMDUR ) 30 MG 24 hr tablet Take 1 tablet (30 mg total) by mouth daily. 30 tablet 11   LIFESCAN FINEPOINT LANCETS MISC Use to check blood sugar 3 time(s) daily     meclizine  (ANTIVERT ) 25 MG tablet Take 1 tablet (25 mg total) by mouth 3 (three) times daily as needed for dizziness. 45 tablet 1   metFORMIN  (GLUCOPHAGE ) 500 MG tablet Take by mouth 2 (two) times daily with a meal.     metoprolol  succinate (TOPROL -XL) 50 MG 24 hr tablet Take 1 tablet (50 mg total) by mouth daily. With or immediately following a meal 90 tablet 0   nitroGLYCERIN  (NITROSTAT ) 0.4 MG SL tablet Place 1 tablet (0.4 mg total) under the tongue every 5 (five) minutes as needed for chest pain. 25 tablet PRN   Omega-3 Fatty Acids (FISH OIL) 1000 MG CAPS Take 1,000 mg by mouth 2 (two) times daily.      omeprazole (PRILOSEC) 20 MG capsule Take 20 mg by mouth daily.   2    tamsulosin  (FLOMAX ) 0.4 MG CAPS capsule Take 0.4 mg by mouth at bedtime.     vitamin B-12 (CYANOCOBALAMIN ) 1000 MCG tablet Take 1 tablet (1,000 mcg total) by mouth daily.     No current facility-administered medications for this visit.    Allergies  Allergen Reactions   Simvastatin Other (See Comments)    dizziness    Review of Systems  Constitutional:  Positive for fatigue.       + weight gain  HENT:  Positive for hearing loss.   Eyes: Negative.   Respiratory:  Positive for chest tightness and shortness of breath.   Cardiovascular:  Positive for chest pain. Negative for leg swelling.  Gastrointestinal: Negative.  Endocrine: Negative.   Genitourinary:  Positive for frequency.  Allergic/Immunologic: Negative.   Neurological:  Positive for dizziness. Negative for syncope.       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 Chronic pain  Hematological:  Bruises/bleeds easily.  Psychiatric/Behavioral:  The patient is nervous/anxious.        Depression    BP 135/65 (BP Location: Right Arm)   Pulse 91   Resp 18   Ht 5' 9 (1.753 m)   Wt 237 lb (107.5 kg)   SpO2 95%   BMI 35.00 kg/m  Physical Exam Constitutional:      Appearance: Normal appearance. He is obese.  HENT:     Head: Normocephalic and atraumatic.  Eyes:     Extraocular Movements: Extraocular movements intact.     Conjunctiva/sclera: Conjunctivae normal.     Pupils: Pupils are equal, round, and reactive to light.  Neck:     Vascular: No carotid bruit.  Cardiovascular:     Rate and Rhythm: Normal rate and regular rhythm.     Pulses: Normal pulses.     Heart sounds: Normal heart sounds. No murmur heard. Pulmonary:     Effort: Pulmonary effort is normal.     Breath sounds: Normal breath sounds.  Abdominal:     Tenderness: There is no abdominal tenderness.  Musculoskeletal:        General: No swelling.  Skin:    General: Skin is warm and dry.  Neurological:     Mental Status: He is alert.      Comments: Weakness and right upper and lower extremity with weak right grip  Psychiatric:        Mood and Affect: Mood normal.        Behavior: Behavior normal.      Diagnostic Tests:  rocedures  LEFT HEART CATH AND CORONARY ANGIOGRAPHY  Coronary Pressure/FFR w/3D Mapping   Conclusion  Conclusions: Severe three-vessel coronary artery disease, as detailed below, including sequential proximal through distal LAD lesions of up to 60-70% that are hemodynamically significant (virtual FFR at 0.76 following proximal stenosis, 0.59 in the distal vessel), codominant LCx with severe proximal through mid disease and chronic total occlusion of large OM3 branch supplied by left to left collaterals, and chronic total occlusion of RCA with left-to-right collaterals. Basal inferior hypokinesis with otherwise preserved left ventricular systolic function.  LVEF 50-55%. Normal left ventricular filling pressure (LVEDP 10 mmHg).   Recommendations: Outpatient cardiac surgery consultation for CABG. Obtain echocardiogram to be done before cardiac surgery evaluation. Add isosorbide  mononitrate 30 mg daily and sublingual nitroglycerin  0.4 mg every 5 minutes as needed for chest pain. Aggressive secondary prevention of coronary artery disease.   Lonni Hanson, MD [facility]     Recommendations  Antiplatelet/Anticoag Continue indefinite aspirin  81 mg daily pending cardiac surgery consultation.  Discharge Date In the absence of any other complications or medical issues, we expect the patient to be ready for discharge from a cath perspective on 09/20/2024.   Clinical Presentation  CHF/Shock Congestive heart failure not present. No shock present.   Procedural Details  Technical Details Indication: 78 y.o. year-old man with history of CAD status post PTCA in the early 90s, HTN, HLD, type II DM, and mild carotid artery stenosis, referred for evaluation of progressive shortness of breath and exertional  chest pain and abnormal myocardial PET/CT.  GFR: >60 ml/min  Procedure: The risks, benefits, complications, treatment options, and expected outcomes were discussed with the patient.  The patient and/or  family concurred with the proposed plan, giving informed consent.  The right wrist was prepped and draped in a sterile fashion.  1% lidocaine  was used for local anesthesia.  Using the modified Seldinger access technique, a 60F slender Glidesheath was placed in the right radial artery.  3 mg Verapamil  was given through the sheath.  Heparin  5,000 units were administered.  Selective coronary angiography was performed using a 44F JL3.5 catheter to engage the left coronary artery and a 44F JR4 catheter to engage the right coronary artery.  Left heart catheterization was performed using a 44F JR4 catheter.  Left ventriculogram was performed with a power injection of contrast.  Virtual FFR of LAD: Hemodynamic assessment of the LAD was performed with virtual FFR with 3D mapping on a dedicated workstation.  FFR was abnormal, measured at 0.76 in the mid LAD immediately following the proximal stenosis and 0.59 in the distal vessel.  At the end of the procedure, the radial artery sheath was removed and a TR band applied to achieve patent hemostasis.  There were no immediate complications.  The patient was taken to the recovery area in stable condition.   Estimated blood loss <50 mL.   During this procedure medications were administered to achieve and maintain moderate conscious sedation while the patient's heart rate, blood pressure, and oxygen saturation were continuously monitored and I was present face-to-face 100% of this time. From  7:32 AM to  8:16 AM, Nidia Finder   From  7:32 AM to  8:16 AM, Richerd Guan Rad Tech and [facility] Cardiovascular Specialist, who are independent, trained observers, assisted in the monitoring of the patient's level of consciousness.   Medications (Filter:  Administrations occurring from [phone] to 0823 on 09/20/24) Heparin  (Porcine) in NaCl 1000-0.9 UT/500ML-% SOLN (mL)  Total volume: 1,000 mL Date/Time Rate/Dose/Volume Action   09/20/24 0719 1,000 mL Given   fentaNYL  (SUBLIMAZE ) injection (mcg)  Total dose: 50 mcg Date/Time Rate/Dose/Volume Action   09/20/24 0732 25 mcg Given   0748 25 mcg Given   midazolam  PF (VERSED ) injection (mg)  Total dose: 2 mg Date/Time Rate/Dose/Volume Action   09/20/24 0732 1 mg Given   0748 1 mg Given   lidocaine  (PF) (XYLOCAINE ) 1 % injection (mL)  Total volume: 2 mL Date/Time Rate/Dose/Volume Action   09/20/24 0742 2 mL Given   Radial Cocktail/Verapamil  only (mL)  Total volume: 10 mL Date/Time Rate/Dose/Volume Action   09/20/24 0745 10 mL Given   heparin  sodium (porcine) injection (Units)  Total dose: 5,000 Units Date/Time Rate/Dose/Volume Action   09/20/24 0747 5,000 Units Given   iohexol  (OMNIPAQUE ) 350 MG/ML injection (mL)  Total volume: 56 mL Date/Time Rate/Dose/Volume Action   09/20/24 0817 56 mL Given    Sedation Time  Sedation Time Physician-1: 43 minutes 19 seconds Contrast     Administrations occurring from 0718 to 0823 on 09/20/24:  Medication Name Total Dose  iohexol  (OMNIPAQUE ) 350 MG/ML injection 56 mL   Radiation/Fluoro  Fluoro time: 2.9 (min) DAP: 32.9 (Gycm2) Cumulative Air Kerma: 474.1 (mGy) Complications  Complications documented before study signed (09/20/2024 11:22 AM)   No complications were associated with this study.  Documented by Sharran Frieze - 09/20/2024  8:20 AM     Coronary Findings  Diagnostic Dominance: Co-dominant Left Main  Vessel is large.    Left Anterior Descending  Vessel is large.  Prox LAD lesion is 65% stenosed. Pressure gradient was performed on the lesion. Virtual FFR: 0.76.  Mid LAD-1 lesion is 50% stenosed. Pressure  gradient was performed on the lesion. Virtual FFR: 59.  Mid LAD-2 lesion is 40% stenosed.  Dist LAD lesion is 40%  stenosed.    First Diagonal Branch  Vessel is moderate in size.  1st Diag lesion is 80% stenosed.    Left Circumflex  Vessel is large.  Ost Cx to Dist Cx lesion is 70% stenosed.  Dist Cx lesion is 80% stenosed.    First Obtuse Marginal Branch  Vessel is small in size. There is moderate disease in the vessel.    Second Obtuse Marginal Branch  Vessel is small in size. There is moderate disease in the vessel.    Third Obtuse Marginal Branch  Vessel is moderate in size.  Collaterals  3rd Mrg filled by collaterals from 2nd LPL.    3rd Mrg lesion is 100% stenosed. The lesion is chronically occluded.    First Left Posterolateral Branch  Vessel is small in size.    Second Left Posterolateral Branch  Vessel is large in size.    Right Coronary Artery  Vessel is large.  Prox RCA to Mid RCA lesion is 100% stenosed. The lesion is chronically occluded.    Acute Marginal Branch  Collaterals  Acute Mrg filled by collaterals from Dist LAD.      Right Posterior Descending Artery  Collaterals  RPDA filled by collaterals from 2nd Sept.      Intervention   No interventions have been documented.   Wall Motion     The following segments are hypokinetic: basilar inferior. The following segments are normal: mid anterior, mid inferior, basilar anterior, apical anterior and apical inferior.           Left Heart  Left Ventricle The left ventricular size is normal. The left ventricular systolic function is normal. LV end diastolic pressure is normal. LVEDP 10 mmHg. The left ventricular ejection fraction is 50-55% by visual estimate. There are LV function abnormalities due to segmental dysfunction.  Aortic Valve There is no aortic valve stenosis.   Coronary Diagrams  Diagnostic Dominance: Co-dominant  Intervention   Implants   No implant documentation for this case.   Syngo Images   Show images for CARDIAC CATHETERIZATION Images on Long Term Storage   Show images for  Jaevian, Shean. Link to Procedure Log  Procedure Log   Hemo Data  Flowsheet Row Most Recent Value  Aortic Mean Gradient 0 mmHg  Aortic Peak Gradient 0 mmHg  AO Systolic Pressure 111 mmHg  AO Diastolic Pressure 54 mmHg  AO Mean 80 mmHg  LV Systolic Pressure 120 mmHg  LV Diastolic Pressure -3 mmHg  LV EDP 9 mmHg  Arterial Occlusion Pressure Extended Systolic Pressure 109 mmHg  Arterial Occlusion Pressure Extended Diastolic Pressure 52 mmHg  Arterial Occlusion Pressure Extended Mean Pressure 79 mmHg  Left Ventricular Apex Extended Systolic Pressure 105 mmHg  LVp Diastolic Pressure 10 mmHg  Left Ventricular Apex Extended EDP Pressure 9 mmHg   ECHOCARDIOGRAM REPORT       Patient Name:   [name] [name]. Date of Exam: 10/04/2024  Medical Rec #:  [id number]           Height:       69.0 in  Accession #:    [id number]          Weight:       226.0 lb  Date of Birth:  Dec 18, 1945           BSA:  2.176 m  Patient Age:    78 years            BP:           0/0 mmHg  Patient Gender: M                   HR:           75 bpm.  Exam Location:  Outpatient   Procedure: 2D Echo, Cardiac Doppler and Color Doppler (Both Spectral and  Color            Flow Doppler were utilized during procedure).   Indications:    CAD    History:        Patient has prior history of Echocardiogram examinations,  most                 recent 07/21/2013. CAD and Previous Myocardial Infarction;  Risk                 Factors:Hypertension, Dyslipidemia, Diabetes and Former  Smoker.    Sonographer:   Koleen Popper RDCS  Referring Phys: [name]     Sonographer Comments: Image acquisition challenging due to patient body  habitus.  IMPRESSIONS     1. Left ventricular ejection fraction, by estimation, is 60 to 65%. The  left ventricle has normal function. The left ventricle has no regional  wall motion abnormalities. There is moderate left ventricular hypertrophy.  Left ventricular  diastolic  parameters are consistent with Grade I diastolic dysfunction (impaired  relaxation).   2. Right ventricular systolic function is normal. The right ventricular  size is normal.   3. The mitral valve is normal in structure. No evidence of mitral valve  regurgitation. No evidence of mitral stenosis.   4. The aortic valve is tricuspid. Aortic valve regurgitation is not  visualized. No aortic stenosis is present.   5. The inferior vena cava is normal in size with greater than 50%  respiratory variability, suggesting right atrial pressure of 3 mmHg.   FINDINGS   Left Ventricle: Left ventricular ejection fraction, by estimation, is 60  to 65%. The left ventricle has normal function. The left ventricle has no  regional wall motion abnormalities. The left ventricular internal cavity  size was normal in size. There is   moderate left ventricular hypertrophy. Left ventricular diastolic  parameters are consistent with Grade I diastolic dysfunction (impaired  relaxation). Indeterminate filling pressures.   Right Ventricle: The right ventricular size is normal. No increase in  right ventricular wall thickness. Right ventricular systolic function is  normal.   Left Atrium: Left atrial size was normal in size.   Right Atrium: Right atrial size was normal in size.   Pericardium: There is no evidence of pericardial effusion.   Mitral Valve: The mitral valve is normal in structure. No evidence of  mitral valve regurgitation. No evidence of mitral valve stenosis.   Tricuspid Valve: The tricuspid valve is normal in structure. Tricuspid  valve regurgitation is not demonstrated. No evidence of tricuspid  stenosis.   Aortic Valve: The aortic valve is tricuspid. Aortic valve regurgitation is  not visualized. No aortic stenosis is present.   Pulmonic Valve: The pulmonic valve was normal in structure. Pulmonic valve  regurgitation is not visualized. No evidence of pulmonic stenosis.    Aorta: The aortic root is normal in size and structure.   Venous: The inferior vena cava is normal in size with greater than 50%  respiratory  variability, suggesting right atrial pressure of 3 mmHg.   IAS/Shunts: No atrial level shunt detected by color flow Doppler.     LEFT VENTRICLE  PLAX 2D  LVIDd:         4.90 cm   Diastology  LVIDs:         2.90 cm   LV e' medial:    4.90 cm/s  LV PW:         1.20 cm   LV E/e' medial:  12.7  LV IVS:        1.40 cm   LV e' lateral:   5.11 cm/s  LVOT diam:     2.10 cm   LV E/e' lateral: 12.2  LV SV:         63  LV SV Index:   29  LVOT Area:     3.46 cm     RIGHT VENTRICLE             IVC  RV S prime:     13.30 cm/s  IVC diam: 1.70 cm  TAPSE (M-mode): 2.5 cm   LEFT ATRIUM             Index  LA diam:        4.40 cm 2.02 cm/m  LA Vol (A2C):   34.1 ml 15.67 ml/m  LA Vol (A4C):   42.9 ml 19.71 ml/m  LA Biplane Vol: 38.7 ml 17.78 ml/m   AORTIC VALVE  LVOT Vmax:   90.60 cm/s  LVOT Vmean:  57.500 cm/s  LVOT VTI:    0.182 m    AORTA  Ao Root diam: 3.40 cm  Ao Asc diam:  3.70 cm   MITRAL VALVE  MV Area (PHT): 3.74 cm    SHUNTS  MV Decel Time: 203 msec    Systemic VTI:  0.18 m  MV E velocity: 62.40 cm/s  Systemic Diam: 2.10 cm  MV A velocity: 81.40 cm/s  MV E/A ratio:  0.77   Annabella Scarce MD  Electronically signed by Annabella Scarce MD  Signature Date/Time: 10/04/2024/12:09:10 PM        Final      Impression:  This 78 year old gentleman has severe three-vessel coronary disease with progressive exertional angina and shortness of breath that is lifestyle limiting.  I agree that coronary artery bypass graft surgery is the best treatment for resolution of his symptoms and to prevent myocardial loss. I discussed the operative procedure with the patient and family including alternatives, benefits and risks; including but not limited to bleeding, blood transfusion, infection, stroke, myocardial infarction, graft failure, heart  block requiring a permanent pacemaker, organ dysfunction, and death.  Donna LULLA Beverley Mickey. understands and agrees to proceed.    Plan:  He will be scheduled for coronary bypass graft surgery on Friday, 10/21/2024.  I spent 45 minutes performing this consultation and > 50% of this time was spent face to face counseling and coordinating the care of this patient's severe multivessel pulmonary disease.   Dorise MARLA Fellers, MD [facility] [phone]

## 2024-10-08 ENCOUNTER — Ambulatory Visit: Payer: Self-pay | Admitting: Cardiology

## 2024-10-18 NOTE — Progress Notes (Signed)
 Surgical Instructions   Your procedure is scheduled on October 21, 2024. Report to [facility] Main Entrance A at 5:30 A.M., then check in with the Admitting office. Any questions or running late day of surgery: call [phone]  Questions prior to your surgery date: call [phone], Monday-Friday, 8am-4pm. If you experience any cold or flu symptoms such as cough, fever, chills, shortness of breath, etc. between now and your scheduled surgery, please notify us  at the above number.     Remember:  Do not eat or drink after midnight the night before your surgery     Take these medicines the morning of surgery with A SIP OF WATER   allopurinol (ZYLOPRIM)  amLODipine  (NORVASC )  atorvastatin  (LIPITOR )  finasteride (PROSCAR)  isosorbide  mononitrate (IMDUR )  metoprolol  succinate (TOPROL -XL)    May take these medicines IF NEEDED: colchicine   fluticasone  (FLONASE )  meclizine  (ANTIVERT )  nitroGLYCERIN  (NITROSTAT ) PLEASE CALL [phone] IF TAKEN PRIOR TO SURGERY  Aspirin  HOLD DAY OF SURGERY  One week prior to surgery, STOP taking any (unless otherwise instructed by your surgeon) Aleve, Naproxen, Ibuprofen, Motrin, Advil, Goody's, BC's, all herbal medications, fish oil, and non-prescription vitamins. THIS INCLUDES YOUR Omega-3 Fatty Acids (FISH OIL PO).      Omega-3 Fatty Acids (FISH OIL PO).   LAST DOSE 10-15-24       WHAT DO I DO ABOUT MY DIABETES MEDICATION?   Do not take oral diabetes medicines metFORMIN  (GLUCOPHAGE ), glipiZIDE  (GLUCOTROL ) the morning of surgery.  glipiZIDE  (GLUCOTROL )   LAST DOSE 10-21-24 metFORMIN  (GLUCOPHAGE ) LAST DOSE 10-18-24  The day of surgery, do not take other diabetes injectables, including Byetta (exenatide), Bydureon (exenatide ER), Victoza (liraglutide), or Trulicity (dulaglutide).  If your CBG is greater than 220 mg/dL, you may take  of your sliding scale (correction) dose of insulin .   HOW TO MANAGE YOUR DIABETES BEFORE AND AFTER  SURGERY  Why is it important to control my blood sugar before and after surgery? Improving blood sugar levels before and after surgery helps healing and can limit problems. A way of improving blood sugar control is eating a healthy diet by:  Eating less sugar and carbohydrates  Increasing activity/exercise  Talking with your doctor about reaching your blood sugar goals High blood sugars (greater than 180 mg/dL) can raise your risk of infections and slow your recovery, so you will need to focus on controlling your diabetes during the weeks before surgery. Make sure that the doctor who takes care of your diabetes knows about your planned surgery including the date and location.  How do I manage my blood sugar before surgery? Check your blood sugar at least 4 times a day, starting 2 days before surgery, to make sure that the level is not too high or low.  Check your blood sugar the morning of your surgery when you wake up and every 2 hours until you get to the Short Stay unit.  If your blood sugar is less than 70 mg/dL, you will need to treat for low blood sugar: Do not take insulin . Treat a low blood sugar (less than 70 mg/dL) with  cup of clear juice (cranberry or apple), 4 glucose tablets, OR glucose gel. Recheck blood sugar in 15 minutes after treatment (to make sure it is greater than 70 mg/dL). If your blood sugar is not greater than 70 mg/dL on recheck, call [phone] for further instructions. Report your blood sugar to the short stay nurse when you get to Short Stay.  If you are admitted to the  hospital after surgery: Your blood sugar will be checked by the staff and you will probably be given insulin  after surgery (instead of oral diabetes medicines) to make sure you have good blood sugar levels. The goal for blood sugar control after surgery is 80-180 mg/dL.          Do NOT Smoke (Tobacco/Vaping) for 24 hours prior to your procedure.  If you use a CPAP at night, you may bring  your mask/headgear for your overnight stay.   You will be asked to remove any contacts, glasses, piercing's, hearing aid's, dentures/partials prior to surgery. Please bring cases for these items if needed.    Patients discharged the day of surgery will not be allowed to drive home, and someone needs to stay with them for 24 hours.  SURGICAL WAITING ROOM VISITATION Patients may have no more than 2 support people in the waiting area - these visitors may rotate.   Pre-op nurse will coordinate an appropriate time for 1 ADULT support person, who may not rotate, to accompany patient in pre-op.  Children under the age of 33 must have an adult with them who is not the patient and must remain in the main waiting area with an adult.  If the patient needs to stay at the hospital during part of their recovery, the visitor guidelines for inpatient rooms apply.  Please refer to the [facility] website for the visitor guidelines for any additional information.   If you received a COVID test during your pre-op visit  it is requested that you wear a mask when out in public, stay away from anyone that may not be feeling well and notify your surgeon if you develop symptoms. If you have been in contact with anyone that has tested positive in the last 10 days please notify you surgeon.      Pre-operative CHG Bathing Instructions   You can play a key role in reducing the risk of infection after surgery. Your skin needs to be as free of germs as possible. You can reduce the number of germs on your skin by washing with CHG (chlorhexidine  gluconate) soap before surgery. CHG is an antiseptic soap that kills germs and continues to kill germs even after washing.   DO NOT use if you have an allergy to chlorhexidine /CHG or antibacterial soaps. If your skin becomes reddened or irritated, stop using the CHG and notify one of our RNs at [phone].              TAKE A SHOWER THE NIGHT BEFORE SURGERY   Please keep in  mind the following:  DO NOT shave, including legs and underarms, 48 hours prior to surgery.   You may shave your face before/day of surgery.  Place clean sheets on your bed the night before surgery Use a clean washcloth (not used since being washed) for shower. DO NOT sleep with pet's night before surgery.  CHG Shower Instructions:  Wash your face and private area with normal soap. If you choose to wash your hair, wash first with your normal shampoo.  After you use shampoo/soap, rinse your hair and body thoroughly to remove shampoo/soap residue.  Turn the water  OFF and apply half the bottle of CHG soap to a CLEAN washcloth.  Apply CHG soap ONLY FROM YOUR NECK DOWN TO YOUR TOES (washing for 3-5 minutes)  DO NOT use CHG soap on face, private areas, open wounds, or sores.  Pay special attention to the area where your surgery is  being performed.  If you are having back surgery, having someone wash your back for you may be helpful. Wait 2 minutes after CHG soap is applied, then you may rinse off the CHG soap.  Pat dry with a clean towel  Put on clean pajamas    Additional instructions for the day of surgery: If you choose, you may shower the morning of surgery with an antibacterial soap.  DO NOT APPLY any lotions, deodorants, cologne, or perfumes.   Do not wear jewelry or makeup Do not wear nail polish, gel polish, artificial nails, or any other type of covering on natural nails (fingers and toes) Do not bring valuables to the hospital. [facility] is not responsible for valuables/personal belongings. Put on clean/comfortable clothes.  Please brush your teeth.  Ask your nurse before applying any prescription medications to the skin.

## 2024-10-19 ENCOUNTER — Other Ambulatory Visit: Payer: Self-pay

## 2024-10-19 ENCOUNTER — Inpatient Hospital Stay (HOSPITAL_COMMUNITY)
Admission: RE | Admit: 2024-10-19 | Discharge: 2024-10-19 | Disposition: A | Source: Ambulatory Visit | Attending: Surgery

## 2024-10-19 ENCOUNTER — Ambulatory Visit (HOSPITAL_COMMUNITY)
Admission: RE | Admit: 2024-10-19 | Discharge: 2024-10-19 | Disposition: A | Source: Ambulatory Visit | Attending: Surgery | Admitting: Surgery

## 2024-10-19 ENCOUNTER — Encounter (HOSPITAL_COMMUNITY): Payer: Self-pay

## 2024-10-19 VITALS — BP 128/65 | HR 80 | Temp 98.0°F | Resp 18 | Ht 69.0 in | Wt 231.2 lb

## 2024-10-19 DIAGNOSIS — Z01818 Encounter for other preprocedural examination: Secondary | ICD-10-CM

## 2024-10-19 DIAGNOSIS — I700 Atherosclerosis of aorta: Secondary | ICD-10-CM | POA: Insufficient documentation

## 2024-10-19 DIAGNOSIS — M4814 Ankylosing hyperostosis [Forestier], thoracic region: Secondary | ICD-10-CM | POA: Insufficient documentation

## 2024-10-19 DIAGNOSIS — Z9861 Coronary angioplasty status: Secondary | ICD-10-CM | POA: Insufficient documentation

## 2024-10-19 DIAGNOSIS — I2510 Atherosclerotic heart disease of native coronary artery without angina pectoris: Secondary | ICD-10-CM

## 2024-10-19 LAB — URINALYSIS, ROUTINE W REFLEX MICROSCOPIC
Bilirubin Urine: NEGATIVE
Glucose, UA: NEGATIVE mg/dL
Hgb urine dipstick: NEGATIVE
Ketones, ur: NEGATIVE mg/dL
Leukocytes,Ua: NEGATIVE
Nitrite: NEGATIVE
Protein, ur: >=300 mg/dL — AB
Specific Gravity, Urine: 1.024 (ref 1.005–1.030)
pH: 5 (ref 5.0–8.0)

## 2024-10-19 LAB — CBC
HCT: 39.9 % (ref 39.0–52.0)
Hemoglobin: 12.8 g/dL — ABNORMAL LOW (ref 13.0–17.0)
MCH: 28.6 pg (ref 26.0–34.0)
MCHC: 32.1 g/dL (ref 30.0–36.0)
MCV: 89.1 fL (ref 80.0–100.0)
Platelets: 266 K/uL (ref 150–400)
RBC: 4.48 MIL/uL (ref 4.22–5.81)
RDW: 12.8 % (ref 11.5–15.5)
WBC: 9 K/uL (ref 4.0–10.5)
nRBC: 0 % (ref 0.0–0.2)

## 2024-10-19 LAB — SURGICAL PCR SCREEN
MRSA, PCR: NEGATIVE
Staphylococcus aureus: NEGATIVE

## 2024-10-19 LAB — COMPREHENSIVE METABOLIC PANEL WITH GFR
ALT: 16 U/L (ref 0–44)
AST: 20 U/L (ref 15–41)
Albumin: 3.9 g/dL (ref 3.5–5.0)
Alkaline Phosphatase: 61 U/L (ref 38–126)
Anion gap: 10 (ref 5–15)
BUN: 12 mg/dL (ref 8–23)
CO2: 25 mmol/L (ref 22–32)
Calcium: 8.8 mg/dL — ABNORMAL LOW (ref 8.9–10.3)
Chloride: 103 mmol/L (ref 98–111)
Creatinine, Ser: 0.79 mg/dL (ref 0.61–1.24)
GFR, Estimated: >60 mL/min (ref >60)
Glucose, Bld: 133 mg/dL — ABNORMAL HIGH (ref 70–99)
Potassium: 4.3 mmol/L (ref 3.5–5.1)
Sodium: 138 mmol/L (ref 135–145)
Total Bilirubin: 0.8 mg/dL (ref 0.0–1.2)
Total Protein: 7.1 g/dL (ref 6.5–8.1)

## 2024-10-19 LAB — TYPE AND SCREEN
ABO/RH(D): A POS
Antibody Screen: NEGATIVE

## 2024-10-19 LAB — HEMOGLOBIN A1C
Hgb A1c MFr Bld: 8.5 % — ABNORMAL HIGH (ref 4.8–5.6)
Mean Plasma Glucose: 197.25 mg/dL

## 2024-10-19 LAB — PROTIME-INR
INR: 1.1 (ref 0.8–1.2)
Prothrombin Time: 14.3 s (ref 11.4–15.2)

## 2024-10-19 LAB — VAS US DOPPLER PRE CABG
Left ABI: 1.1
Right ABI: 1

## 2024-10-19 LAB — GLUCOSE, CAPILLARY: Glucose-Capillary: 148 mg/dL — ABNORMAL HIGH (ref 70–99)

## 2024-10-19 LAB — APTT: aPTT: 33 s (ref 24–36)

## 2024-10-19 NOTE — Progress Notes (Signed)
 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   Patient denies shortness of breath, fever, cough and chest pain at PAT appointment   All instructions explained to the patient, with a verbal understanding of the material. Patient agrees to go over the instructions while at home for a better understanding. Patient also instructed to self quarantine after being tested for COVID-19. The opportunity to ask questions was provided.

## 2024-10-20 ENCOUNTER — Encounter (HOSPITAL_COMMUNITY): Payer: Self-pay | Admitting: Surgery

## 2024-10-20 MED ORDER — CEFAZOLIN SODIUM-DEXTROSE 2-4 GM/100ML-% IV SOLN
2.0000 g | INTRAVENOUS | Status: AC
  Administered 2024-10-21: 2 g via INTRAVENOUS
  Filled 2024-10-20: qty 100

## 2024-10-20 MED ORDER — MAGNESIUM SULFATE 50 % IJ SOLN
40.0000 meq | INTRAMUSCULAR | Status: DC
  Filled 2024-10-20: qty 9.85

## 2024-10-20 MED ORDER — EPINEPHRINE HCL 5 MG/250ML IV SOLN IN NS
0.0000 ug/min | INTRAVENOUS | Status: DC
  Filled 2024-10-20: qty 250

## 2024-10-20 MED ORDER — TRANEXAMIC ACID (OHS) BOLUS VIA INFUSION
15.0000 mg/kg | INTRAVENOUS | Status: AC
  Administered 2024-10-21: 1573.5 mg via INTRAVENOUS
  Filled 2024-10-20: qty 1574

## 2024-10-20 MED ORDER — TRANEXAMIC ACID (OHS) PUMP PRIME SOLUTION
2.0000 mg/kg | INTRAVENOUS | Status: DC
  Filled 2024-10-20 (×2): qty 2.1

## 2024-10-20 MED ORDER — MILRINONE LACTATE IN DEXTROSE 20-5 MG/100ML-% IV SOLN
0.3000 ug/kg/min | INTRAVENOUS | Status: DC
  Filled 2024-10-20: qty 100

## 2024-10-20 MED ORDER — HEPARIN 30,000 UNITS/1000 ML (OHS) CELLSAVER SOLUTION
Status: DC
  Filled 2024-10-20: qty 1000

## 2024-10-20 MED ORDER — POTASSIUM CHLORIDE 2 MEQ/ML IV SOLN
80.0000 meq | INTRAVENOUS | Status: DC
  Filled 2024-10-20: qty 40

## 2024-10-20 MED ORDER — NOREPINEPHRINE 4 MG/250ML-% IV SOLN
0.0000 ug/min | INTRAVENOUS | Status: DC
  Filled 2024-10-20: qty 250

## 2024-10-20 MED ORDER — NITROGLYCERIN IN D5W 200-5 MCG/ML-% IV SOLN
2.0000 ug/min | INTRAVENOUS | Status: DC
  Filled 2024-10-20: qty 250

## 2024-10-20 MED ORDER — TRANEXAMIC ACID 1000 MG/10ML IV SOLN
1.5000 mg/kg/h | INTRAVENOUS | Status: AC
  Administered 2024-10-21: 1.5 mg/kg/h via INTRAVENOUS
  Filled 2024-10-20: qty 25

## 2024-10-20 MED ORDER — VANCOMYCIN HCL 1.5 G IV SOLR
1500.0000 mg | INTRAVENOUS | Status: AC
  Administered 2024-10-21: 1500 mg via INTRAVENOUS
  Filled 2024-10-20: qty 30

## 2024-10-20 MED ORDER — PLASMA-LYTE A IV SOLN
INTRAVENOUS | Status: DC
  Filled 2024-10-20: qty 2.5

## 2024-10-20 MED ORDER — INSULIN REGULAR(HUMAN) IN NACL 100-0.9 UT/100ML-% IV SOLN
INTRAVENOUS | Status: AC
  Administered 2024-10-21: 4.6 [IU]/h via INTRAVENOUS
  Filled 2024-10-20: qty 100

## 2024-10-20 MED ORDER — DEXMEDETOMIDINE HCL IN NACL 400 MCG/100ML IV SOLN
0.1000 ug/kg/h | INTRAVENOUS | Status: AC
  Administered 2024-10-21: .4 ug/kg/h via INTRAVENOUS
  Filled 2024-10-20: qty 100

## 2024-10-20 MED ORDER — PHENYLEPHRINE HCL-NACL 20-0.9 MG/250ML-% IV SOLN
30.0000 ug/min | INTRAVENOUS | Status: AC
  Administered 2024-10-21: 20 ug/min via INTRAVENOUS
  Filled 2024-10-20: qty 250

## 2024-10-20 NOTE — H&P (Signed)
 [address] [place] [address]             [phone]      Cardiothoracic Surgery Admission History and Physical   PCP is Corrington, Kip A, MD Referring Provider is End, Lonni, MD       Chief Complaint  Patient presents with   Coronary Artery Disease           HPI:   The patient is a 78 year old gentleman with a history of diabetes, dyslipidemia, hypertension, coronary disease status post PTCA in the early 1990s, who presented with increasing shortness of breath and intermittent chest discomfort over the past 6 to 8 months.  It typically occurred when he was walking to his mailbox which is about 400 yards from his home.  He has not had any chest pain at rest but has had some shortness of breath at rest.  He reports some orthopnea.  He has had no peripheral edema.  A PET CT scan was performed which was high risk suggesting multivessel coronary disease particularly in the left circumflex distribution.  Global myocardial blood flow reserve is highly abnormal and left ventricular ejection fraction was mildly to moderately reduced.  Cardiac catheterization 09/20/2024 showed severe three-vessel coronary disease.  There were sequential proximal to distal LAD lesions up to 60 to 70% there were hemodynamically significant with a FFR at 0.76 on the proximal stenosis and 0.59 in the distal vessel.  The left circumflex was codominant with severe proximal to mid vessel disease and chronic total occlusion of a large OM 3 branch that fills late by collaterals from the left.  There is chronic total occlusion of the RCA with left-to-right collaterals.  Left ventricular ejection fraction is estimated at 50 to 55% with an LVEDP measured at 10 mmHg.  2D echocardiogram showed an ejection fraction of 60 to 65% with no regional wall motion abnormalities.  There is moderate LVH.  There is no valvular disease.   He has significant chronic disability due to prior spinal surgery with  complications resulting in persistent numbness and weakness in the right upper and lower extremity.  He uses a cane to walk.     Past Medical History:  Diagnosis Date   CAD (coronary artery disease)     Cancer (HCC)     Diabetes (HCC)     Dyslipidemia     Gout     History of kidney stones     HTN (hypertension)     Myocardial infarction [facility])                 Past Surgical History:  Procedure Laterality Date   ANTERIOR CERVICAL DECOMP/DISCECTOMY FUSION Left 10/20/2019    Procedure: Left Cervical Three-Four Cervical Four-Five Cervical Five-Six Anterior cervical decompression/discectomy/fusion;  Surgeon: Cheryle Debby LABOR, MD;  Location: [facility] OR;  Service: Neurosurgery;  Laterality: Left;  Left Cervical Three-Four Cervical Four-Five Cervical Five-Six Anterior cervical decompression/discectomy/fusion   BACK SURGERY       CORONARY PRESSURE/FFR WITH 3D MAPPING N/A 09/20/2024    Procedure: Coronary Pressure/FFR w/3D Mapping;  Surgeon: Mady Lonni, MD;  Location: [facility] INVASIVE CV LAB;  Service: Cardiovascular;  Laterality: N/A;   LEFT HEART CATH AND CORONARY ANGIOGRAPHY N/A 09/20/2024    Procedure: LEFT HEART CATH AND CORONARY ANGIOGRAPHY;  Surgeon: Mady Lonni, MD;  Location: [facility] INVASIVE CV LAB;  Service: Cardiovascular;  Laterality: N/A;   left knee surgery       left  shoulder surgery       POSTERIOR CERVICAL FUSION/FORAMINOTOMY N/A 09/17/2020    Procedure: CERVICAL THREE TO CERVICAL SIX POSTERIOR CERVICAL DECOMPRESSION;  Surgeon: Cheryle Debby LABOR, MD;  Location: [facility] OR;  Service: Neurosurgery;  Laterality: N/A;   right knee surgery                   Family History  Problem Relation Age of Onset   Heart attack Father 45   Colon cancer Brother 71          Social History Social History  Social History         Tobacco Use   Smoking status: Former      Current packs/day: 0.00      Average packs/day: 5.0 packs/day for 36.0 years (180.0 ttl pk-yrs)      Types:  Cigarettes      Start date: 11/24/1952      Quit date: 11/24/1988      Years since quitting: 35.8      Passive exposure: Never   Smokeless tobacco: Current      Types: Snuff  Vaping Use   Vaping status: Never Used  Substance Use Topics   Alcohol use: No      Alcohol/week: 0.0 standard drinks of alcohol   Drug use: No              Current Outpatient Medications  Medication Sig Dispense Refill   allopurinol (ZYLOPRIM) 100 MG tablet Take 100 mg by mouth daily.       aspirin  EC 81 MG tablet Take 1 tablet (81 mg total) by mouth daily. Swallow whole. 90 tablet 3   atorvastatin  (LIPITOR ) 20 MG tablet Take 1 tablet by mouth daily.       b complex vitamins tablet Take 1 tablet by mouth daily.       Cholecalciferol 50 MCG (2000 UT) TABS Take 2,000 Units by mouth daily.       colchicine  0.6 MG tablet Take 0.6 mg by mouth daily as needed (gout flare).       finasteride (PROSCAR) 5 MG tablet Take 5 mg by mouth daily.       fluticasone  (FLONASE ) 50 MCG/ACT nasal spray Place 2 sprays into both nostrils daily. (Patient taking differently: Place 2 sprays into both nostrils daily as needed (Congestion).) 16 g 0   glipiZIDE  (GLUCOTROL ) 5 MG tablet Take 5 mg by mouth daily.       isosorbide  mononitrate (IMDUR ) 30 MG 24 hr tablet Take 1 tablet (30 mg total) by mouth daily. 30 tablet 11   LIFESCAN FINEPOINT LANCETS MISC Use to check blood sugar 3 time(s) daily       meclizine  (ANTIVERT ) 25 MG tablet Take 1 tablet (25 mg total) by mouth 3 (three) times daily as needed for dizziness. 45 tablet 1   metFORMIN  (GLUCOPHAGE ) 500 MG tablet Take by mouth 2 (two) times daily with a meal.       metoprolol  succinate (TOPROL -XL) 50 MG 24 hr tablet Take 1 tablet (50 mg total) by mouth daily. With or immediately following a meal 90 tablet 0   nitroGLYCERIN  (NITROSTAT ) 0.4 MG SL tablet Place 1 tablet (0.4 mg total) under the tongue every 5 (five) minutes as needed for chest pain. 25 tablet PRN   Omega-3 Fatty Acids (FISH  OIL) 1000 MG CAPS Take 1,000 mg by mouth 2 (two) times daily.        omeprazole (PRILOSEC) 20 MG capsule Take 20 mg by  mouth daily.    2   tamsulosin  (FLOMAX ) 0.4 MG CAPS capsule Take 0.4 mg by mouth at bedtime.       vitamin B-12 (CYANOCOBALAMIN ) 1000 MCG tablet Take 1 tablet (1,000 mcg total) by mouth daily.          No current facility-administered medications for this visit.        Allergies       Allergies  Allergen Reactions   Simvastatin Other (See Comments)      dizziness        Review of Systems  Constitutional:  Positive for fatigue.       + weight gain  HENT:  Positive for hearing loss.   Eyes: Negative.   Respiratory:  Positive for chest tightness and shortness of breath.   Cardiovascular:  Positive for chest pain. Negative for leg swelling.  Gastrointestinal: Negative.   Endocrine: Negative.   Genitourinary:  Positive for frequency.  Allergic/Immunologic: Negative.   Neurological:  Positive for dizziness. Negative for syncope.       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 Chronic pain  Hematological:  Bruises/bleeds easily.  Psychiatric/Behavioral:  The patient is nervous/anxious.        Depression     BP 135/65 (BP Location: Right Arm)   Pulse 91   Resp 18   Ht 5' 9 (1.753 m)   Wt 237 lb (107.5 kg)   SpO2 95%   BMI 35.00 kg/m  Physical Exam Constitutional:      Appearance: Normal appearance. He is obese.  HENT:     Head: Normocephalic and atraumatic.  Eyes:     Extraocular Movements: Extraocular movements intact.     Conjunctiva/sclera: Conjunctivae normal.     Pupils: Pupils are equal, round, and reactive to light.  Neck:     Vascular: No carotid bruit.  Cardiovascular:     Rate and Rhythm: Normal rate and regular rhythm.     Pulses: Normal pulses.     Heart sounds: Normal heart sounds. No murmur heard. Pulmonary:     Effort: Pulmonary effort is normal.     Breath sounds: Normal breath sounds.  Abdominal:     Tenderness: There  is no abdominal tenderness.  Musculoskeletal:        General: No swelling.  Skin:    General: Skin is warm and dry.  Neurological:     Mental Status: He is alert.     Comments: Weakness and right upper and lower extremity with weak right grip  Psychiatric:        Mood and Affect: Mood normal.        Behavior: Behavior normal.         Diagnostic Tests:   rocedures   LEFT HEART CATH AND CORONARY ANGIOGRAPHY  Coronary Pressure/FFR w/3D Mapping    Conclusion   Conclusions: Severe three-vessel coronary artery disease, as detailed below, including sequential proximal through distal LAD lesions of up to 60-70% that are hemodynamically significant (virtual FFR at 0.76 following proximal stenosis, 0.59 in the distal vessel), codominant LCx with severe proximal through mid disease and chronic total occlusion of large OM3 branch supplied by left to left collaterals, and chronic total occlusion of RCA with left-to-right collaterals. Basal inferior hypokinesis with otherwise preserved left ventricular systolic function.  LVEF 50-55%. Normal left ventricular filling pressure (LVEDP 10 mmHg).   Recommendations: Outpatient cardiac surgery consultation for CABG. Obtain echocardiogram to be done before cardiac surgery evaluation. Add isosorbide  mononitrate 30 mg  daily and sublingual nitroglycerin  0.4 mg every 5 minutes as needed for chest pain. Aggressive secondary prevention of coronary artery disease.   Lonni Hanson, MD [facility]     Recommendations       Antiplatelet/Anticoag Continue indefinite aspirin  81 mg daily pending cardiac surgery consultation.  Discharge Date In the absence of any other complications or medical issues, we expect the patient to be ready for discharge from a cath perspective on 09/20/2024.    Clinical Presentation   CHF/Shock Congestive heart failure not present. No shock present.    Procedural Details   Technical Details Indication: 78 y.o.  year-old man with history of CAD status post PTCA in the early 90s, HTN, HLD, type II DM, and mild carotid artery stenosis, referred for evaluation of progressive shortness of breath and exertional chest pain and abnormal myocardial PET/CT.  GFR: >60 ml/min  Procedure: The risks, benefits, complications, treatment options, and expected outcomes were discussed with the patient.  The patient and/or family concurred with the proposed plan, giving informed consent.  The right wrist was prepped and draped in a sterile fashion.  1% lidocaine  was used for local anesthesia.  Using the modified Seldinger access technique, a 51F slender Glidesheath was placed in the right radial artery.  3 mg Verapamil  was given through the sheath.  Heparin  5,000 units were administered.  Selective coronary angiography was performed using a 62F JL3.5 catheter to engage the left coronary artery and a 62F JR4 catheter to engage the right coronary artery.  Left heart catheterization was performed using a 62F JR4 catheter.  Left ventriculogram was performed with a power injection of contrast.  Virtual FFR of LAD: Hemodynamic assessment of the LAD was performed with virtual FFR with 3D mapping on a dedicated workstation.  FFR was abnormal, measured at 0.76 in the mid LAD immediately following the proximal stenosis and 0.59 in the distal vessel.  At the end of the procedure, the radial artery sheath was removed and a TR band applied to achieve patent hemostasis.  There were no immediate complications.  The patient was taken to the recovery area in stable condition.   Estimated blood loss <50 mL.   During this procedure medications were administered to achieve and maintain moderate conscious sedation while the patient's heart rate, blood pressure, and oxygen saturation were continuously monitored and I was present face-to-face 100% of this time. From  7:32 AM to  8:16 AM, Nidia Finder   From  7:32 AM to  8:16 AM, Richerd Guan [employer] and [facility] Cardiovascular Specialist, who are independent, trained observers, assisted in the monitoring of the patient's level of consciousness.    Medications (Filter: Administrations occurring from [id number] to 0823 on 09/20/24) Heparin  (Porcine) in NaCl 1000-0.9 UT/500ML-% SOLN (mL)  Total volume: 1,000 mL Date/Time Rate/Dose/Volume Action    09/20/24 0719 1,000 mL Given    fentaNYL  (SUBLIMAZE ) injection (mcg)  Total dose: 50 mcg Date/Time Rate/Dose/Volume Action    09/20/24 0732 25 mcg Given    0748 25 mcg Given    midazolam  PF (VERSED ) injection (mg)  Total dose: 2 mg Date/Time Rate/Dose/Volume Action    09/20/24 0732 1 mg Given    0748 1 mg Given    lidocaine  (PF) (XYLOCAINE ) 1 % injection (mL)  Total volume: 2 mL Date/Time Rate/Dose/Volume Action    09/20/24 0742 2 mL Given    Radial Cocktail/Verapamil  only (mL)  Total volume: 10 mL Date/Time Rate/Dose/Volume Action    09/20/24 0745 10  mL Given    heparin  sodium (porcine) injection (Units)  Total dose: 5,000 Units Date/Time Rate/Dose/Volume Action    09/20/24 0747 5,000 Units Given    iohexol  (OMNIPAQUE ) 350 MG/ML injection (mL)  Total volume: 56 mL Date/Time Rate/Dose/Volume Action    09/20/24 0817 56 mL Given      Sedation Time   Sedation Time Physician-1: 43 minutes 19 seconds Contrast        Administrations occurring from 0718 to 0823 on 09/20/24:  Medication Name Total Dose  iohexol  (OMNIPAQUE ) 350 MG/ML injection 56 mL    Radiation/Fluoro   Fluoro time: 2.9 (min) DAP: 32.9 (Gycm2) Cumulative Air Kerma: 474.1 (mGy) Complications   Complications documented before study signed (09/20/2024 11:22 AM)    No complications were associated with this study.  Documented by Sharran Frieze - 09/20/2024  8:20 AM      Coronary Findings   Diagnostic Dominance: Co-dominant Left Main  Vessel is large.    Left Anterior Descending  Vessel is large.  Prox LAD lesion is 65% stenosed. Pressure gradient  was performed on the lesion. Virtual FFR: 0.76.  Mid LAD-1 lesion is 50% stenosed. Pressure gradient was performed on the lesion. Virtual FFR: 59.  Mid LAD-2 lesion is 40% stenosed.  Dist LAD lesion is 40% stenosed.    First Diagonal Branch  Vessel is moderate in size.  1st Diag lesion is 80% stenosed.    Left Circumflex  Vessel is large.  Ost Cx to Dist Cx lesion is 70% stenosed.  Dist Cx lesion is 80% stenosed.    First Obtuse Marginal Branch  Vessel is small in size. There is moderate disease in the vessel.    Second Obtuse Marginal Branch  Vessel is small in size. There is moderate disease in the vessel.    Third Obtuse Marginal Branch  Vessel is moderate in size.  Collaterals  3rd Mrg filled by collaterals from 2nd LPL.     3rd Mrg lesion is 100% stenosed. The lesion is chronically occluded.    First Left Posterolateral Branch  Vessel is small in size.    Second Left Posterolateral Branch  Vessel is large in size.    Right Coronary Artery  Vessel is large.  Prox RCA to Mid RCA lesion is 100% stenosed. The lesion is chronically occluded.    Acute Marginal Branch  Collaterals  Acute Mrg filled by collaterals from Dist LAD.       Right Posterior Descending Artery  Collaterals  RPDA filled by collaterals from 2nd Sept.       Intervention    No interventions have been documented.    Wall Motion       The following segments are hypokinetic: basilar inferior. The following segments are normal: mid anterior, mid inferior, basilar anterior, apical anterior and apical inferior.             Left Heart       Left Ventricle The left ventricular size is normal. The left ventricular systolic function is normal. LV end diastolic pressure is normal. LVEDP 10 mmHg. The left ventricular ejection fraction is 50-55% by visual estimate. There are LV function abnormalities due to segmental dysfunction.  Aortic Valve There is no aortic valve stenosis.    Coronary  Diagrams   Diagnostic Dominance: Co-dominant  Intervention    Implants    No implant documentation for this case.    Syngo Images    Show images for CARDIAC CATHETERIZATION Images on Long Term Storage  Show images for Bradshaw, Minihan. Link to Procedure Log   Procedure Log    Hemo Data   Flowsheet Row Most Recent Value  Aortic Mean Gradient 0 mmHg  Aortic Peak Gradient 0 mmHg  AO Systolic Pressure 111 mmHg  AO Diastolic Pressure 54 mmHg  AO Mean 80 mmHg  LV Systolic Pressure 120 mmHg  LV Diastolic Pressure -3 mmHg  LV EDP 9 mmHg  Arterial Occlusion Pressure Extended Systolic Pressure 109 mmHg  Arterial Occlusion Pressure Extended Diastolic Pressure 52 mmHg  Arterial Occlusion Pressure Extended Mean Pressure 79 mmHg  Left Ventricular Apex Extended Systolic Pressure 105 mmHg  LVp Diastolic Pressure 10 mmHg  Left Ventricular Apex Extended EDP Pressure 9 mmHg    ECHOCARDIOGRAM REPORT       Patient Name:   [name] [name]. Date of Exam: 10/04/2024  Medical Rec #:  [id number]           Height:       69.0 in  Accession #:    [id number]          Weight:       226.0 lb  Date of Birth:  30-Oct-1946           BSA:          2.176 m  Patient Age:    78 years            BP:           0/0 mmHg  Patient Gender: M                   HR:           75 bpm.  Exam Location:  Outpatient   Procedure: 2D Echo, Cardiac Doppler and Color Doppler (Both Spectral and  Color            Flow Doppler were utilized during procedure).   Indications:    CAD    History:        Patient has prior history of Echocardiogram examinations,  most                 recent 07/21/2013. CAD and Previous Myocardial Infarction;  Risk                 Factors:Hypertension, Dyslipidemia, Diabetes and Former  Smoker.    Sonographer:   Koleen Popper RDCS  Referring Phys: [name]     Sonographer Comments: Image acquisition challenging due to patient body  habitus.  IMPRESSIONS     1. Left  ventricular ejection fraction, by estimation, is 60 to 65%. The  left ventricle has normal function. The left ventricle has no regional  wall motion abnormalities. There is moderate left ventricular hypertrophy.  Left ventricular diastolic  parameters are consistent with Grade I diastolic dysfunction (impaired  relaxation).   2. Right ventricular systolic function is normal. The right ventricular  size is normal.   3. The mitral valve is normal in structure. No evidence of mitral valve  regurgitation. No evidence of mitral stenosis.   4. The aortic valve is tricuspid. Aortic valve regurgitation is not  visualized. No aortic stenosis is present.   5. The inferior vena cava is normal in size with greater than 50%  respiratory variability, suggesting right atrial pressure of 3 mmHg.   FINDINGS   Left Ventricle: Left ventricular ejection fraction, by estimation, is 60  to 65%. The left ventricle has normal function. The left ventricle  has no  regional wall motion abnormalities. The left ventricular internal cavity  size was normal in size. There is   moderate left ventricular hypertrophy. Left ventricular diastolic  parameters are consistent with Grade I diastolic dysfunction (impaired  relaxation). Indeterminate filling pressures.   Right Ventricle: The right ventricular size is normal. No increase in  right ventricular wall thickness. Right ventricular systolic function is  normal.   Left Atrium: Left atrial size was normal in size.   Right Atrium: Right atrial size was normal in size.   Pericardium: There is no evidence of pericardial effusion.   Mitral Valve: The mitral valve is normal in structure. No evidence of  mitral valve regurgitation. No evidence of mitral valve stenosis.   Tricuspid Valve: The tricuspid valve is normal in structure. Tricuspid  valve regurgitation is not demonstrated. No evidence of tricuspid  stenosis.   Aortic Valve: The aortic valve is tricuspid.  Aortic valve regurgitation is  not visualized. No aortic stenosis is present.   Pulmonic Valve: The pulmonic valve was normal in structure. Pulmonic valve  regurgitation is not visualized. No evidence of pulmonic stenosis.   Aorta: The aortic root is normal in size and structure.   Venous: The inferior vena cava is normal in size with greater than 50%  respiratory variability, suggesting right atrial pressure of 3 mmHg.   IAS/Shunts: No atrial level shunt detected by color flow Doppler.     LEFT VENTRICLE  PLAX 2D  LVIDd:         4.90 cm   Diastology  LVIDs:         2.90 cm   LV e' medial:    4.90 cm/s  LV PW:         1.20 cm   LV E/e' medial:  12.7  LV IVS:        1.40 cm   LV e' lateral:   5.11 cm/s  LVOT diam:     2.10 cm   LV E/e' lateral: 12.2  LV SV:         63  LV SV Index:   29  LVOT Area:     3.46 cm     RIGHT VENTRICLE             IVC  RV S prime:     13.30 cm/s  IVC diam: 1.70 cm  TAPSE (M-mode): 2.5 cm   LEFT ATRIUM             Index  LA diam:        4.40 cm 2.02 cm/m  LA Vol (A2C):   34.1 ml 15.67 ml/m  LA Vol (A4C):   42.9 ml 19.71 ml/m  LA Biplane Vol: 38.7 ml 17.78 ml/m   AORTIC VALVE  LVOT Vmax:   90.60 cm/s  LVOT Vmean:  57.500 cm/s  LVOT VTI:    0.182 m    AORTA  Ao Root diam: 3.40 cm  Ao Asc diam:  3.70 cm   MITRAL VALVE  MV Area (PHT): 3.74 cm    SHUNTS  MV Decel Time: 203 msec    Systemic VTI:  0.18 m  MV E velocity: 62.40 cm/s  Systemic Diam: 2.10 cm  MV A velocity: 81.40 cm/s  MV E/A ratio:  0.77   Annabella Scarce MD  Electronically signed by Annabella Scarce MD  Signature Date/Time: 10/04/2024/12:09:10 PM        Final        Impression:   This  78 year old gentleman has severe three-vessel coronary disease with progressive exertional angina and shortness of breath that is lifestyle limiting.  I agree that coronary artery bypass graft surgery is the best treatment for resolution of his symptoms and to prevent myocardial loss.  I discussed the operative procedure with the patient and family including alternatives, benefits and risks; including but not limited to bleeding, blood transfusion, infection, stroke, myocardial infarction, graft failure, heart block requiring a permanent pacemaker, organ dysfunction, and death.  Donna LULLA Beverley Mickey. understands and agrees to proceed.     Plan:   Coronary bypass graft surgery.    Dorise MARLA Fellers, MD [facility] [phone]

## 2024-10-20 NOTE — Discharge Instructions (Signed)

## 2024-10-20 NOTE — Discharge Summary (Signed)
 [address] [place] [address]             [id number]        Physician Discharge Summary  Patient ID: [name] [name]. MRN: [id number] DOB/AGE: [birth year] 78 y.o.  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   Cervical myelopathy (HCC) 10/20/2019   SOB (shortness of breath) on exertion 07/22/2013   CAD S/P percutaneous coronary angioplasty 07/09/2013   HTN (hypertension) 07/09/2013   Hyperlipidemia 07/09/2013   Chest pain 07/09/2013     Discharge Diagnoses:  Patient Active Problem List   Diagnosis Date Noted   AKI (acute kidney injury) 10/22/2024   Hyponatremia 10/22/2024   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   Vertigo    Generalized weakness 02/11/2022   Fall at home, initial encounter 02/11/2022   Bradycardia 12/26/2020   Diabetes mellitus with neuropathy (HCC) 10/04/2020   Neurogenic bladder 10/04/2020   Tobacco use disorder 10/04/2020   Gout 10/04/2020   Constipation 10/04/2020   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 It typically occurred when he was walking to his mailbox which is about 400 yards from his home. He has not had any chest pain at rest but has had some shortness of breath at rest. He reports some orthopnea. He has had no peripheral edema. A PET CT scan was performed which was high risk suggesting multivessel coronary disease particularly in the left circumflex distribution. Global myocardial blood flow reserve is highly abnormal and left ventricular ejection fraction was mildly to moderately reduced. Cardiac catheterization 09/20/2024 showed severe three-vessel coronary disease. There were sequential proximal to distal LAD lesions up to 60 to 70% there were hemodynamically significant with a FFR at 0.76 on the proximal stenosis and 0.59 in the distal vessel. The left circumflex was codominant with severe proximal to mid vessel disease and chronic total occlusion of a large OM 3 branch that fills late by collaterals from the left. There is chronic total occlusion of the RCA with left-to-right collaterals. Left ventricular ejection fraction is estimated at 50 to 55% with an LVEDP measured at 10 mmHg. 2D echocardiogram showed an ejection fraction of 60 to 65% with no regional wall motion abnormalities. There is moderate LVH. There is no valvular disease.  He has significant chronic disability due to prior spinal surgery with complications resulting in persistent numbness and weakness in the right upper and lower extremity. He uses a cane to walk. Dr. [name] discussed the need for  coronary artery bypass grafting surgery. Potential risks, benefits, and complications of the surgery were discussed with the patient and he agreed to proceed with surgery. Pre operative carotid duplex US  showed 40-59% right internal carotid artery stenosis and a 60-79% left internal carotid artery stenosis.  Hospital Course: Patient underwent a CABG x 3  with the left internal mammary artery grafted to the left anterior descending coronary artery and separate saphenous vein grafts to the first and third obtuse marginal coronary arteries. The diagonal and distal RCA/ PDA were felt to be to small and diseased for grafting. Following the procedure, he separated from cardiopulmonary bypass without difficulty and was transferred to the surgical ICU in stable condition. He was extubated early evening of surgery. He was weaned off Neo Synephrine drip. Swan and a line were removed. Epicardial pacing wires were removed followed by chest tubes later POD 1. He was above his pre op weight and diuresed with Lasix . He was transitioned off the Insulin  drip. His pre op HGA1C was 8.5. He was on Glipizide  and Metformin  prior to surgery. These will be restarted later in his post op course. He will need close medical follow up after discharge. She developed afib with RVR, Amiodarone  protocol and low dose lopressor  were started. He converted to NSR, IV Amiodarone  was transitioned to PO Amiodarone . He was felt stable for transfer to the progressive unit. He had a run of VT and brief run of a fib the evening of POD 3. He has been maintaining SR since then. He is on Amiodarone  400 mg bid and Lopressor  25 mg bid. Losartan  was added on POD 4 for better BP control. He has been ambulating on room air with good oxygenation. He has been tolerating a diet and had a bowel movement. He is still above his pre op weight with LE edema so diuresis will be continued after discharge. Of note, Lasix  was stopped and he was given Demadex  to try and augment diuresis. Sternal and RLE wounds are clean, dry, healing without signs of infection.  Per inpatient rehab note yesterday, family can provide care at his current physical level and does not want to pursue CIR anymore. As recommended, home PT, DME 3 in 1, DME tub bench, DME RW, and DME bedside commode have been ordered. He is stable for  discharge.  Consults: pulmonary/intensive care  Significant Diagnostic Studies:  Narrative & Impression  EXAM: 2 VIEW(S) XRAY OF THE CHEST 10/25/2024 05:58:00 AM   COMPARISON: 10/24/2024   CLINICAL HISTORY: Atelectasis   FINDINGS:   LINES, TUBES AND DEVICES: Interval removal of right IJ introducer sheath.   LUNGS AND PLEURA: Low lung volumes. Bilateral pleural effusions, left greater than right. Atelectasis at lung bases. Underlying consolidation not excluded. No pneumothorax.   HEART AND MEDIASTINUM: Mild cardiomegaly. Aortic atherosclerosis. Sternotomy noted.   BONES AND SOFT TISSUES: Lower cervical ACDF hardware noted. No acute osseous abnormality.   IMPRESSION: 1. Small bilateral pleural effusions, left greater than right. 2. Low lung volumes with basilar atelectasis; superimposed consolidation cannot be excluded.   Electronically signed by: Waddell Calk MD 10/25/2024 08:32 AM EST RP Workstation: [id number]     Narrative & Impression  EXAM: 1 VIEW(S) XRAY OF THE CHEST 10/24/2024 06:17:00 AM   COMPARISON: Portable chest yesterday at 6:02 am.   CLINICAL HISTORY: 08790 Atelectasis 91209 Atelectasis   FINDINGS:   LINES, TUBES AND DEVICES: Right IJ catheter introducer sheath remains in place terminating in the upper SVC.   LUNGS AND PLEURA: Small pleural  effusions are present. Basilar opacities, denser on the left, are noted, which could represent atelectasis or consolidation.   The remaining lung fields are clear. No pneumothorax. Overall aeration seems unchanged.   HEART AND MEDIASTINUM: CABG changes and mediastinal configuration are stable. The heart is slightly enlarged but no vascular congestion is seen.   BONES AND SOFT TISSUES: No acute osseous abnormality.   IMPRESSION: 1. Small pleural effusions and basilar opacities denser on the left, possibly atelectasis or consolidation. Stable overall aeration. 2. Slightly enlarged heart without  vascular congestion.   Electronically signed by: Francis Quam MD 10/24/2024 07:17 AM EST RP Workstation: [id number]      Treatments: surgery:  Median Sternotomy Extracorporeal circulation 3.   Coronary artery bypass grafting x 3   Left internal mammary artery graft to the LAD SVG to OM 2 SVG to OM3   4.   Endoscopic vein harvest from the right leg by Dr. [name] on 12/111/2025.  Discharge Exam: Blood pressure 139/64, pulse 85, temperature 99.2 F (37.3 C), temperature source Oral, resp. rate 18, height 5' 9 (1.753 m), weight 110.8 kg, SpO2 100%.  Cardiovascular: RRR Pulmonary: Diminished bibasilar breath sounds Abdomen: Soft, non tender, bowel sounds present. Extremities: ++ bilateral lower extremity edema.** Wounds: Clean and dry.  No erythema or signs of infection.   Discharge Medications:  The patient has been discharged on:   1.Beta Blocker:  Yes [ x  ]                              No   [   ]                              If No, reason:  2.Ace Inhibitor/ARB: Yes [  x ]                                     No  [    ]                                     If No, reason:  3.Statin:   Yes [  x ]                  No  [   ]                  If No, reason:  4.Ecasa:  Yes  [  x ]                  No   [   ]                  If No, reason:  Patient had ACS upon admission:  Plavix/P2Y12 inhibitor: Yes [   ]                                      No  [  x ]     Discharge Instructions     Amb Referral to Cardiac Rehabilitation   Complete by: As directed    Diagnosis: CABG   CABG X ___: 3   After  initial evaluation and assessments completed: Virtual Based Care may be provided alone or in conjunction with Phase 2 Cardiac Rehab based on patient barriers.: Yes   Intensive Cardiac Rehabilitation (ICR) [facility] location only OR Traditional Cardiac Rehabilitation (TCR) *If criteria for ICR are not met will enroll in TCR ([facility] only): Yes      Allergies as of 10/27/2024        Reactions   Simvastatin Other (See Comments)   dizziness        Medication List     STOP taking these medications    amLODipine  5 MG tablet Commonly known as: NORVASC    isosorbide  mononitrate 30 MG 24 hr tablet Commonly known as: IMDUR    metoprolol  succinate 50 MG 24 hr tablet Commonly known as: TOPROL -XL   nitroGLYCERIN  0.4 MG SL tablet Commonly known as: Nitrostat        TAKE these medications    allopurinol  100 MG tablet Commonly known as: ZYLOPRIM  Take 100 mg by mouth in the morning.   amiodarone  200 MG tablet Commonly known as: PACERONE  Take 1 tablet (200 mg) by mouth twice a day for 7 days, then take 1 tablet (200 mg) by mouth daily thereafter.   aspirin  EC 325 MG tablet Take 1 tablet (325 mg total) by mouth daily. What changed:  medication strength how much to take additional instructions   atorvastatin  40 MG tablet Commonly known as: LIPITOR  Take 1 tablet (40 mg total) by mouth in the morning. What changed:  medication strength how much to take   Cholecalciferol 50 MCG (2000 UT) Tabs Take 1,000 Units by mouth daily.   colchicine  0.6 MG tablet Take 0.6 mg by mouth daily as needed (gout flare).   cyanocobalamin  1000 MCG tablet Commonly known as: VITAMIN B12 Take 1 tablet (1,000 mcg total) by mouth daily.   finasteride  5 MG tablet Commonly known as: PROSCAR  Take 5 mg by mouth daily.   FISH OIL PO Take 2,400 mg by mouth in the morning.   fluticasone  50 MCG/ACT nasal spray Commonly known as: FLONASE  Place 2 sprays into both nostrils daily as needed (Congestion).   glipiZIDE  5 MG tablet Commonly known as: GLUCOTROL  Take 5 mg by mouth in the morning and at bedtime.   LIFESCAN FINEPOINT LANCETS Misc Use to check blood sugar 3 time(s) daily   losartan  25 MG tablet Commonly known as: COZAAR  Take 1 tablet (25 mg total) by mouth daily.   meclizine  25 MG tablet Commonly known as: ANTIVERT  Take 1 tablet (25 mg total) by mouth 3 (three)  times daily as needed for dizziness.   metFORMIN  500 MG tablet Commonly known as: GLUCOPHAGE  Take 500 mg by mouth 2 (two) times daily with a meal.   metoprolol  tartrate 25 MG tablet Commonly known as: LOPRESSOR  Take 1 tablet (25 mg total) by mouth 2 (two) times daily.   Oxycodone  HCl 10 MG Tabs Take 1 tablet (10 mg total) by mouth every 6 (six) hours as needed for severe pain (pain score 7-10).   potassium chloride  SA 20 MEQ tablet Commonly known as: KLOR-CON  M Take 1 tablet (20 mEq total) by mouth daily. For 10 days then stop   tamsulosin  0.4 MG Caps capsule Commonly known as: FLOMAX  Take 0.4 mg by mouth at bedtime.   torsemide  20 MG tablet Commonly known as: DEMADEX  Take 1 tablet (20 mg total) by mouth daily. For 10 days then stop.               Durable Medical  Equipment  (From admission, onward)           Start     Ordered   10/27/24 0657  For home use only DME Walker rolling  Once       Question Answer Comment  Walker: With 5 Inch Wheels   Patient needs a walker to treat with the following condition Physical deconditioning      10/27/24 0703   10/27/24 0656  For home use only DME 3 n 1  Once        10/27/24 0703   10/27/24 0656  For home use only DME Bedside commode  Once       Question:  Patient needs a bedside commode to treat with the following condition  Answer:  Physical deconditioning   10/27/24 0703   10/27/24 0656  For home use only DME Tub bench  Once        10/27/24 0703             Follow-up Information     Rutha Manuelita HERO, PA-C. Go on 11/10/2024.   Specialties: Physician Assistant, Thoracic Surgery Why: Please arrive by 9:00 am in order to have a PA/LAT CXR taken PRIOR to office appointment with L. Fenyak. CXR located in the same building on the SECOND floor. Appointment time is at 10:00 am Contact information: [address], [place] [address] [phone]         Corrington, Kip A, MD. Call.   Specialty:  Family Medicine Why: for a follow up for 2-3 weeks regarding further diabetes management and surveillance of HGA1C 8.4 Contact information: [address] [place] [address] [phone]         Madie Jon Garre, [place]. Go on 11/15/2024.   Specialties: Cardiology, Radiology Why: Appointment time is at 10:05 am Contact information: [address] [place] [address] [phone]         Rutha Manuelita HERO, PA-C. Go on 11/03/2024.   Specialties: Physician Assistant, Thoracic Surgery Why: Appointment time is at 11:30 am. No CXR needed at this appointment. This appointment is to re assess edema Contact information: [address], [place] [address] [phone]                 Signed:  Kyla HERO Donald, PA-C 10/27/2024, 10:18 AM

## 2024-10-20 NOTE — Hospital Course (Addendum)
 HPI: This is a 78 year old gentleman with a history of diabetes, dyslipidemia, hypertension, coronary disease status post PTCA in the early 1990s, who presented with increasing shortness of breath and intermittent chest discomfort over the past 6 to 8 months. It typically occurred when he was walking to his mailbox which is about 400 yards from his home. He has not had any chest pain at rest but has had some shortness of breath at rest. He reports some orthopnea. He has had no peripheral edema. A PET CT scan was performed which was high risk suggesting multivessel coronary disease particularly in the left circumflex distribution. Global myocardial blood flow reserve is highly abnormal and left ventricular ejection fraction was mildly to moderately reduced. Cardiac catheterization 09/20/2024 showed severe three-vessel coronary disease. There were sequential proximal to distal LAD lesions up to 60 to 70% there were hemodynamically significant with a FFR at 0.76 on the proximal stenosis and 0.59 in the distal vessel. The left circumflex was codominant with severe proximal to mid vessel disease and chronic total occlusion of a large OM 3 branch that fills late by collaterals from the left. There is chronic total occlusion of the RCA with left-to-right collaterals. Left ventricular ejection fraction is estimated at 50 to 55% with an LVEDP measured at 10 mmHg. 2D echocardiogram showed an ejection fraction of 60 to 65% with no regional wall motion abnormalities. There is moderate LVH. There is no valvular disease.  He has significant chronic disability due to prior spinal surgery with complications resulting in persistent numbness and weakness in the right upper and lower extremity. He uses a cane to walk. Dr. [name] discussed the need for coronary artery bypass grafting surgery. Potential risks, benefits, and complications of the surgery were discussed with the patient and he agreed to proceed with surgery. Pre  operative carotid duplex US  showed 40-59% right internal carotid artery stenosis and a 60-79% left internal carotid artery stenosis.  Hospital Course: Patient underwent a CABG x 3 with the left internal mammary artery grafted to the left anterior descending coronary artery and separate saphenous vein grafts to the first and third obtuse marginal coronary arteries. The diagonal and distal RCA/ PDA were felt to be to small and diseased for grafting. Following the procedure, he separated from cardiopulmonary bypass without difficulty and was transferred to the surgical ICU in stable condition. He was extubated early evening of surgery. He was weaned off Neo Synephrine drip. Swan and a line were removed. Epicardial pacing wires were removed followed by chest tubes later POD 1. He was above his pre op weight and diuresed with Lasix . He was transitioned off the Insulin  drip. His pre op HGA1C was 8.5. He was on Glipizide  and Metformin  prior to surgery. These will be restarted later in his post op course. He will need close medical follow up after discharge. She developed afib with RVR, Amiodarone  protocol and low dose lopressor  were started. He converted to NSR, IV Amiodarone  was transitioned to PO Amiodarone . He was felt stable for transfer to the progressive unit. He had a run of VT and brief run of a fib the evening of POD 3. He has been maintaining SR since then. He is on Amiodarone  400 mg bid and Lopressor  25 mg bid. Losartan  was added on POD 4 for better BP control. He has been ambulating on room air with good oxygenation. He has been tolerating a diet and had a bowel movement. He is still above his pre op weight with LE edema  so diuresis will be continued after discharge. Sternal and RLE wounds are clean, dry, healing without signs of infection. He is stable for discharge to CIR.

## 2024-10-20 NOTE — Anesthesia Preprocedure Evaluation (Addendum)
 Anesthesia Evaluation  Patient identified by MRN, date of birth, ID band Patient awake    Reviewed: Allergy & Precautions, NPO status , Patient's Chart, lab work & pertinent test results, reviewed documented beta blocker date and time   Airway Mallampati: III  TM Distance: >3 FB Neck ROM: Limited    Dental  (+) Dental Advisory Given, Edentulous Lower, Edentulous Upper   Pulmonary former smoker   Pulmonary exam normal breath sounds clear to auscultation       Cardiovascular hypertension, Pt. on medications and Pt. on home beta blockers + CAD and + Past MI  Normal cardiovascular exam Rhythm:Regular Rate:Normal  Echo 10/04/24:  1. Left ventricular ejection fraction, by estimation, is 60 to 65%. The  left ventricle has normal function. The left ventricle has no regional  wall motion abnormalities. There is moderate left ventricular hypertrophy.  Left ventricular diastolic  parameters are consistent with Grade I diastolic dysfunction (impaired  relaxation).   2. Right ventricular systolic function is normal. The right ventricular  size is normal.   3. The mitral valve is normal in structure. No evidence of mitral valve  regurgitation. No evidence of mitral stenosis.   4. The aortic valve is tricuspid. Aortic valve regurgitation is not  visualized. No aortic stenosis is present.   5. The inferior vena cava is normal in size with greater than 50%  respiratory variability, suggesting right atrial pressure of 3 mmHg.     Neuro/Psych POSTERIOR CERVICAL FUSION/FORAMINOTOMY  ANTERIOR CERVICAL DECOMP/DISCECTOMY FUSION  Neuromuscular disease    GI/Hepatic negative GI ROS, Neg liver ROS,,,  Endo/Other  diabetes, Type 2, Oral Hypoglycemic Agents  Obesity   Renal/GU negative Renal ROS     Musculoskeletal negative musculoskeletal ROS (+)    Abdominal   Peds  Hematology negative hematology ROS (+)   Anesthesia Other  Findings   Reproductive/Obstetrics                              Anesthesia Physical Anesthesia Plan  ASA: 4  Anesthesia Plan: General   Post-op Pain Management:    Induction: Intravenous  PONV Risk Score and Plan: 2 and Treatment may vary due to age or medical condition and Midazolam   Airway Management Planned: Oral ETT and Video Laryngoscope Planned  Additional Equipment: CVP, PA Cath, TEE and Ultrasound Guidance Line Placement  Intra-op Plan:   Post-operative Plan: Post-operative intubation/ventilation  Informed Consent: I have reviewed the patients History and Physical, chart, labs and discussed the procedure including the risks, benefits and alternatives for the proposed anesthesia with the patient or authorized representative who has indicated his/her understanding and acceptance.     Dental advisory given  Plan Discussed with: CRNA  Anesthesia Plan Comments:          Anesthesia Quick Evaluation

## 2024-10-21 ENCOUNTER — Inpatient Hospital Stay (HOSPITAL_COMMUNITY)
Admission: RE | Admit: 2024-10-21 | Discharge: 2024-10-27 | DRG: 235 | Disposition: A | Discharge status: Home Health | Attending: Surgery | Admitting: Surgery

## 2024-10-21 ENCOUNTER — Inpatient Hospital Stay (HOSPITAL_COMMUNITY)

## 2024-10-21 ENCOUNTER — Inpatient Hospital Stay (HOSPITAL_COMMUNITY): Admission: RE | Disposition: A | Payer: Self-pay | Source: Home / Self Care | Attending: Surgery

## 2024-10-21 ENCOUNTER — Other Ambulatory Visit: Payer: Self-pay

## 2024-10-21 ENCOUNTER — Inpatient Hospital Stay (HOSPITAL_COMMUNITY): Admitting: Anesthesiology

## 2024-10-21 ENCOUNTER — Inpatient Hospital Stay (HOSPITAL_COMMUNITY): Admitting: Physician Assistant

## 2024-10-21 ENCOUNTER — Encounter (HOSPITAL_COMMUNITY): Payer: Self-pay | Admitting: Surgery

## 2024-10-21 DIAGNOSIS — Z794 Long term (current) use of insulin: Secondary | ICD-10-CM | POA: Diagnosis not present

## 2024-10-21 DIAGNOSIS — R5381 Other malaise: Secondary | ICD-10-CM | POA: Diagnosis not present

## 2024-10-21 DIAGNOSIS — I452 Bifascicular block: Secondary | ICD-10-CM | POA: Diagnosis present

## 2024-10-21 DIAGNOSIS — E785 Hyperlipidemia, unspecified: Secondary | ICD-10-CM | POA: Diagnosis present

## 2024-10-21 DIAGNOSIS — N179 Acute kidney failure, unspecified: Secondary | ICD-10-CM | POA: Diagnosis not present

## 2024-10-21 DIAGNOSIS — H9190 Unspecified hearing loss, unspecified ear: Secondary | ICD-10-CM | POA: Diagnosis present

## 2024-10-21 DIAGNOSIS — E119 Type 2 diabetes mellitus without complications: Secondary | ICD-10-CM

## 2024-10-21 DIAGNOSIS — I10 Essential (primary) hypertension: Secondary | ICD-10-CM | POA: Diagnosis present

## 2024-10-21 DIAGNOSIS — I480 Paroxysmal atrial fibrillation: Secondary | ICD-10-CM | POA: Diagnosis present

## 2024-10-21 DIAGNOSIS — Z9861 Coronary angioplasty status: Secondary | ICD-10-CM

## 2024-10-21 DIAGNOSIS — Z9889 Other specified postprocedural states: Secondary | ICD-10-CM

## 2024-10-21 DIAGNOSIS — Z87891 Personal history of nicotine dependence: Secondary | ICD-10-CM

## 2024-10-21 DIAGNOSIS — G8191 Hemiplegia, unspecified affecting right dominant side: Secondary | ICD-10-CM | POA: Diagnosis present

## 2024-10-21 DIAGNOSIS — I700 Atherosclerosis of aorta: Secondary | ICD-10-CM | POA: Diagnosis present

## 2024-10-21 DIAGNOSIS — Z888 Allergy status to other drugs, medicaments and biological substances status: Secondary | ICD-10-CM

## 2024-10-21 DIAGNOSIS — D6959 Other secondary thrombocytopenia: Secondary | ICD-10-CM | POA: Diagnosis present

## 2024-10-21 DIAGNOSIS — M109 Gout, unspecified: Secondary | ICD-10-CM | POA: Diagnosis present

## 2024-10-21 DIAGNOSIS — Z79899 Other long term (current) drug therapy: Secondary | ICD-10-CM

## 2024-10-21 DIAGNOSIS — E871 Hypo-osmolality and hyponatremia: Secondary | ICD-10-CM | POA: Diagnosis not present

## 2024-10-21 DIAGNOSIS — D696 Thrombocytopenia, unspecified: Secondary | ICD-10-CM

## 2024-10-21 DIAGNOSIS — I252 Old myocardial infarction: Secondary | ICD-10-CM

## 2024-10-21 DIAGNOSIS — M4814 Ankylosing hyperostosis [Forestier], thoracic region: Secondary | ICD-10-CM | POA: Diagnosis present

## 2024-10-21 DIAGNOSIS — Z720 Tobacco use: Secondary | ICD-10-CM

## 2024-10-21 DIAGNOSIS — J9811 Atelectasis: Secondary | ICD-10-CM | POA: Diagnosis present

## 2024-10-21 DIAGNOSIS — Z01818 Encounter for other preprocedural examination: Secondary | ICD-10-CM

## 2024-10-21 DIAGNOSIS — K08109 Complete loss of teeth, unspecified cause, unspecified class: Secondary | ICD-10-CM | POA: Diagnosis present

## 2024-10-21 DIAGNOSIS — E1140 Type 2 diabetes mellitus with diabetic neuropathy, unspecified: Secondary | ICD-10-CM | POA: Diagnosis present

## 2024-10-21 DIAGNOSIS — N400 Enlarged prostate without lower urinary tract symptoms: Secondary | ICD-10-CM | POA: Diagnosis present

## 2024-10-21 DIAGNOSIS — Z9181 History of falling: Secondary | ICD-10-CM

## 2024-10-21 DIAGNOSIS — I2510 Atherosclerotic heart disease of native coronary artery without angina pectoris: Principal | ICD-10-CM | POA: Diagnosis present

## 2024-10-21 DIAGNOSIS — E669 Obesity, unspecified: Secondary | ICD-10-CM | POA: Diagnosis present

## 2024-10-21 DIAGNOSIS — I6523 Occlusion and stenosis of bilateral carotid arteries: Secondary | ICD-10-CM | POA: Diagnosis present

## 2024-10-21 DIAGNOSIS — I25118 Atherosclerotic heart disease of native coronary artery with other forms of angina pectoris: Secondary | ICD-10-CM | POA: Diagnosis not present

## 2024-10-21 DIAGNOSIS — E1165 Type 2 diabetes mellitus with hyperglycemia: Secondary | ICD-10-CM | POA: Diagnosis present

## 2024-10-21 DIAGNOSIS — J90 Pleural effusion, not elsewhere classified: Secondary | ICD-10-CM | POA: Diagnosis present

## 2024-10-21 DIAGNOSIS — Z859 Personal history of malignant neoplasm, unspecified: Secondary | ICD-10-CM

## 2024-10-21 DIAGNOSIS — E8770 Fluid overload, unspecified: Secondary | ICD-10-CM | POA: Diagnosis not present

## 2024-10-21 DIAGNOSIS — Z6837 Body mass index (BMI) 37.0-37.9, adult: Secondary | ICD-10-CM | POA: Diagnosis not present

## 2024-10-21 DIAGNOSIS — Z981 Arthrodesis status: Secondary | ICD-10-CM

## 2024-10-21 DIAGNOSIS — R490 Dysphonia: Secondary | ICD-10-CM | POA: Diagnosis present

## 2024-10-21 DIAGNOSIS — R6510 Systemic inflammatory response syndrome (SIRS) of non-infectious origin without acute organ dysfunction: Secondary | ICD-10-CM | POA: Diagnosis not present

## 2024-10-21 DIAGNOSIS — J9589 Other postprocedural complications and disorders of respiratory system, not elsewhere classified: Secondary | ICD-10-CM | POA: Diagnosis not present

## 2024-10-21 DIAGNOSIS — I472 Ventricular tachycardia, unspecified: Secondary | ICD-10-CM | POA: Diagnosis not present

## 2024-10-21 DIAGNOSIS — Z602 Problems related to living alone: Secondary | ICD-10-CM | POA: Diagnosis present

## 2024-10-21 DIAGNOSIS — Z7984 Long term (current) use of oral hypoglycemic drugs: Secondary | ICD-10-CM

## 2024-10-21 DIAGNOSIS — Z87442 Personal history of urinary calculi: Secondary | ICD-10-CM

## 2024-10-21 DIAGNOSIS — Z951 Presence of aortocoronary bypass graft: Secondary | ICD-10-CM

## 2024-10-21 DIAGNOSIS — Z7982 Long term (current) use of aspirin: Secondary | ICD-10-CM

## 2024-10-21 DIAGNOSIS — R0689 Other abnormalities of breathing: Secondary | ICD-10-CM | POA: Diagnosis not present

## 2024-10-21 DIAGNOSIS — Z8249 Family history of ischemic heart disease and other diseases of the circulatory system: Secondary | ICD-10-CM

## 2024-10-21 DIAGNOSIS — I214 Non-ST elevation (NSTEMI) myocardial infarction: Secondary | ICD-10-CM | POA: Diagnosis present

## 2024-10-21 DIAGNOSIS — Z800 Family history of malignant neoplasm of digestive organs: Secondary | ICD-10-CM

## 2024-10-21 HISTORY — PX: INTRAOPERATIVE TRANSESOPHAGEAL ECHOCARDIOGRAM: SHX5062

## 2024-10-21 HISTORY — PX: CORONARY ARTERY BYPASS GRAFT: SHX141

## 2024-10-21 LAB — CBC
HCT: 28.7 % — ABNORMAL LOW (ref 39.0–52.0)
HCT: 29.7 % — ABNORMAL LOW (ref 39.0–52.0)
Hemoglobin: 9.3 g/dL — ABNORMAL LOW (ref 13.0–17.0)
Hemoglobin: 9.6 g/dL — ABNORMAL LOW (ref 13.0–17.0)
MCH: 29 pg (ref 26.0–34.0)
MCH: 29.3 pg (ref 26.0–34.0)
MCHC: 32.4 g/dL (ref 30.0–36.0)
MCHC: 32.3 g/dL (ref 30.0–36.0)
MCV: 89.4 fL (ref 80.0–100.0)
MCV: 90.5 fL (ref 80.0–100.0)
Platelets: 161 K/uL (ref 150–400)
Platelets: 204 K/uL (ref 150–400)
RBC: 3.21 MIL/uL — ABNORMAL LOW (ref 4.22–5.81)
RBC: 3.28 MIL/uL — ABNORMAL LOW (ref 4.22–5.81)
RDW: 13 % (ref 11.5–15.5)
RDW: 12.9 % (ref 11.5–15.5)
WBC: 10.4 K/uL (ref 4.0–10.5)
WBC: 12.8 K/uL — ABNORMAL HIGH (ref 4.0–10.5)
nRBC: 0 % (ref 0.0–0.2)
nRBC: 0 % (ref 0.0–0.2)

## 2024-10-21 LAB — POCT I-STAT EG7
Acid-base deficit: 2 mmol/L (ref 0.0–2.0)
Bicarbonate: 24.5 mmol/L (ref 20.0–28.0)
Calcium, Ion: 1.09 mmol/L — ABNORMAL LOW (ref 1.15–1.40)
HCT: 26 % — ABNORMAL LOW (ref 39.0–52.0)
Hemoglobin: 8.8 g/dL — ABNORMAL LOW (ref 13.0–17.0)
O2 Saturation: 83 %
Potassium: 4.1 mmol/L (ref 3.5–5.1)
Sodium: 140 mmol/L (ref 135–145)
TCO2: 26 mmol/L (ref 22–32)
pCO2, Ven: 49 mmHg (ref 44–60)
pH, Ven: 7.308 (ref 7.25–7.43)
pO2, Ven: 53 mmHg — ABNORMAL HIGH (ref 32–45)

## 2024-10-21 LAB — POCT I-STAT 7, (LYTES, BLD GAS, ICA,H+H)
Acid-base deficit: 3 mmol/L — ABNORMAL HIGH (ref 0.0–2.0)
Acid-base deficit: 2 mmol/L (ref 0.0–2.0)
Acid-base deficit: 2 mmol/L (ref 0.0–2.0)
Acid-base deficit: 3 mmol/L — ABNORMAL HIGH (ref 0.0–2.0)
Acid-base deficit: 3 mmol/L — ABNORMAL HIGH (ref 0.0–2.0)
Acid-base deficit: 2 mmol/L (ref 0.0–2.0)
Bicarbonate: 23.7 mmol/L (ref 20.0–28.0)
Bicarbonate: 24.1 mmol/L (ref 20.0–28.0)
Bicarbonate: 22.8 mmol/L (ref 20.0–28.0)
Bicarbonate: 22.2 mmol/L (ref 20.0–28.0)
Bicarbonate: 22.5 mmol/L (ref 20.0–28.0)
Bicarbonate: 23.2 mmol/L (ref 20.0–28.0)
Calcium, Ion: 1.18 mmol/L (ref 1.15–1.40)
Calcium, Ion: 1.02 mmol/L — ABNORMAL LOW (ref 1.15–1.40)
Calcium, Ion: 1.09 mmol/L — ABNORMAL LOW (ref 1.15–1.40)
Calcium, Ion: 1.12 mmol/L — ABNORMAL LOW (ref 1.15–1.40)
Calcium, Ion: 1.13 mmol/L — ABNORMAL LOW (ref 1.15–1.40)
Calcium, Ion: 1.12 mmol/L — ABNORMAL LOW (ref 1.15–1.40)
HCT: 31 % — ABNORMAL LOW (ref 39.0–52.0)
HCT: 26 % — ABNORMAL LOW (ref 39.0–52.0)
HCT: 28 % — ABNORMAL LOW (ref 39.0–52.0)
HCT: 29 % — ABNORMAL LOW (ref 39.0–52.0)
HCT: 29 % — ABNORMAL LOW (ref 39.0–52.0)
HCT: 28 % — ABNORMAL LOW (ref 39.0–52.0)
Hemoglobin: 10.5 g/dL — ABNORMAL LOW (ref 13.0–17.0)
Hemoglobin: 8.8 g/dL — ABNORMAL LOW (ref 13.0–17.0)
Hemoglobin: 9.5 g/dL — ABNORMAL LOW (ref 13.0–17.0)
Hemoglobin: 9.9 g/dL — ABNORMAL LOW (ref 13.0–17.0)
Hemoglobin: 9.9 g/dL — ABNORMAL LOW (ref 13.0–17.0)
Hemoglobin: 9.5 g/dL — ABNORMAL LOW (ref 13.0–17.0)
O2 Saturation: 99 %
O2 Saturation: 100 %
O2 Saturation: 100 %
O2 Saturation: 99 %
O2 Saturation: 99 %
O2 Saturation: 98 %
Patient temperature: 36.7
Patient temperature: 36.8
Patient temperature: 37.3
Potassium: 3.8 mmol/L (ref 3.5–5.1)
Potassium: 3.9 mmol/L (ref 3.5–5.1)
Potassium: 4.3 mmol/L (ref 3.5–5.1)
Potassium: 4 mmol/L (ref 3.5–5.1)
Potassium: 4.4 mmol/L (ref 3.5–5.1)
Potassium: 4.3 mmol/L (ref 3.5–5.1)
Sodium: 140 mmol/L (ref 135–145)
Sodium: 139 mmol/L (ref 135–145)
Sodium: 139 mmol/L (ref 135–145)
Sodium: 141 mmol/L (ref 135–145)
Sodium: 140 mmol/L (ref 135–145)
Sodium: 139 mmol/L (ref 135–145)
TCO2: 25 mmol/L (ref 22–32)
TCO2: 26 mmol/L (ref 22–32)
TCO2: 24 mmol/L (ref 22–32)
TCO2: 23 mmol/L (ref 22–32)
TCO2: 24 mmol/L (ref 22–32)
TCO2: 24 mmol/L (ref 22–32)
pCO2 arterial: 48.3 mmHg — ABNORMAL HIGH (ref 32–48)
pCO2 arterial: 49.3 mmHg — ABNORMAL HIGH (ref 32–48)
pCO2 arterial: 38.8 mmHg (ref 32–48)
pCO2 arterial: 40.5 mmHg (ref 32–48)
pCO2 arterial: 40.4 mmHg (ref 32–48)
pCO2 arterial: 42.6 mmHg (ref 32–48)
pH, Arterial: 7.298 — ABNORMAL LOW (ref 7.35–7.45)
pH, Arterial: 7.297 — ABNORMAL LOW (ref 7.35–7.45)
pH, Arterial: 7.378 (ref 7.35–7.45)
pH, Arterial: 7.346 — ABNORMAL LOW (ref 7.35–7.45)
pH, Arterial: 7.352 (ref 7.35–7.45)
pH, Arterial: 7.346 — ABNORMAL LOW (ref 7.35–7.45)
pO2, Arterial: 177 mmHg — ABNORMAL HIGH (ref 83–108)
pO2, Arterial: 417 mmHg — ABNORMAL HIGH (ref 83–108)
pO2, Arterial: 195 mmHg — ABNORMAL HIGH (ref 83–108)
pO2, Arterial: 128 mmHg — ABNORMAL HIGH (ref 83–108)
pO2, Arterial: 126 mmHg — ABNORMAL HIGH (ref 83–108)
pO2, Arterial: 109 mmHg — ABNORMAL HIGH (ref 83–108)

## 2024-10-21 LAB — POCT I-STAT, CHEM 8
BUN: 18 mg/dL (ref 8–23)
BUN: 18 mg/dL (ref 8–23)
BUN: 17 mg/dL (ref 8–23)
BUN: 16 mg/dL (ref 8–23)
BUN: 17 mg/dL (ref 8–23)
Calcium, Ion: 1.19 mmol/L (ref 1.15–1.40)
Calcium, Ion: 1.19 mmol/L (ref 1.15–1.40)
Calcium, Ion: 1.1 mmol/L — ABNORMAL LOW (ref 1.15–1.40)
Calcium, Ion: 1.11 mmol/L — ABNORMAL LOW (ref 1.15–1.40)
Calcium, Ion: 1.11 mmol/L — ABNORMAL LOW (ref 1.15–1.40)
Chloride: 102 mmol/L (ref 98–111)
Chloride: 103 mmol/L (ref 98–111)
Chloride: 102 mmol/L (ref 98–111)
Chloride: 102 mmol/L (ref 98–111)
Chloride: 102 mmol/L (ref 98–111)
Creatinine, Ser: 1 mg/dL (ref 0.61–1.24)
Creatinine, Ser: 1 mg/dL (ref 0.61–1.24)
Creatinine, Ser: 0.8 mg/dL (ref 0.61–1.24)
Creatinine, Ser: 0.8 mg/dL (ref 0.61–1.24)
Creatinine, Ser: 0.8 mg/dL (ref 0.61–1.24)
Glucose, Bld: 177 mg/dL — ABNORMAL HIGH (ref 70–99)
Glucose, Bld: 141 mg/dL — ABNORMAL HIGH (ref 70–99)
Glucose, Bld: 139 mg/dL — ABNORMAL HIGH (ref 70–99)
Glucose, Bld: 161 mg/dL — ABNORMAL HIGH (ref 70–99)
Glucose, Bld: 163 mg/dL — ABNORMAL HIGH (ref 70–99)
HCT: 33 % — ABNORMAL LOW (ref 39.0–52.0)
HCT: 28 % — ABNORMAL LOW (ref 39.0–52.0)
HCT: 26 % — ABNORMAL LOW (ref 39.0–52.0)
HCT: 26 % — ABNORMAL LOW (ref 39.0–52.0)
HCT: 30 % — ABNORMAL LOW (ref 39.0–52.0)
Hemoglobin: 11.2 g/dL — ABNORMAL LOW (ref 13.0–17.0)
Hemoglobin: 9.5 g/dL — ABNORMAL LOW (ref 13.0–17.0)
Hemoglobin: 8.8 g/dL — ABNORMAL LOW (ref 13.0–17.0)
Hemoglobin: 10.2 g/dL — ABNORMAL LOW (ref 13.0–17.0)
Hemoglobin: 8.8 g/dL — ABNORMAL LOW (ref 13.0–17.0)
Potassium: 3.9 mmol/L (ref 3.5–5.1)
Potassium: 3.9 mmol/L (ref 3.5–5.1)
Potassium: 4.2 mmol/L (ref 3.5–5.1)
Potassium: 4.3 mmol/L (ref 3.5–5.1)
Potassium: 4.7 mmol/L (ref 3.5–5.1)
Sodium: 141 mmol/L (ref 135–145)
Sodium: 141 mmol/L (ref 135–145)
Sodium: 141 mmol/L (ref 135–145)
Sodium: 140 mmol/L (ref 135–145)
Sodium: 141 mmol/L (ref 135–145)
TCO2: 24 mmol/L (ref 22–32)
TCO2: 24 mmol/L (ref 22–32)
TCO2: 26 mmol/L (ref 22–32)
TCO2: 23 mmol/L (ref 22–32)
TCO2: 26 mmol/L (ref 22–32)

## 2024-10-21 LAB — GLUCOSE, CAPILLARY
Glucose-Capillary: 128 mg/dL — ABNORMAL HIGH (ref 70–99)
Glucose-Capillary: 129 mg/dL — ABNORMAL HIGH (ref 70–99)
Glucose-Capillary: 131 mg/dL — ABNORMAL HIGH (ref 70–99)
Glucose-Capillary: 133 mg/dL — ABNORMAL HIGH (ref 70–99)
Glucose-Capillary: 137 mg/dL — ABNORMAL HIGH (ref 70–99)
Glucose-Capillary: 141 mg/dL — ABNORMAL HIGH (ref 70–99)
Glucose-Capillary: 142 mg/dL — ABNORMAL HIGH (ref 70–99)
Glucose-Capillary: 159 mg/dL — ABNORMAL HIGH (ref 70–99)
Glucose-Capillary: 177 mg/dL — ABNORMAL HIGH (ref 70–99)

## 2024-10-21 LAB — HEMOGLOBIN AND HEMATOCRIT, BLOOD
HCT: 25.7 % — ABNORMAL LOW (ref 39.0–52.0)
Hemoglobin: 8.5 g/dL — ABNORMAL LOW (ref 13.0–17.0)

## 2024-10-21 LAB — MAGNESIUM: Magnesium: 2.5 mg/dL — ABNORMAL HIGH (ref 1.7–2.4)

## 2024-10-21 LAB — APTT: aPTT: 34 s (ref 24–36)

## 2024-10-21 LAB — BASIC METABOLIC PANEL WITH GFR
Anion gap: 8 (ref 5–15)
BUN: 15 mg/dL (ref 8–23)
CO2: 23 mmol/L (ref 22–32)
Calcium: 7.7 mg/dL — ABNORMAL LOW (ref 8.9–10.3)
Chloride: 107 mmol/L (ref 98–111)
Creatinine, Ser: 0.89 mg/dL (ref 0.61–1.24)
GFR, Estimated: >60 mL/min (ref >60)
Glucose, Bld: 118 mg/dL — ABNORMAL HIGH (ref 70–99)
Potassium: 4.2 mmol/L (ref 3.5–5.1)
Sodium: 138 mmol/L (ref 135–145)

## 2024-10-21 LAB — PROTIME-INR
INR: 1.3 — ABNORMAL HIGH (ref 0.8–1.2)
Prothrombin Time: 17.2 s — ABNORMAL HIGH (ref 11.4–15.2)

## 2024-10-21 LAB — PLATELET COUNT: Platelets: 190 K/uL (ref 150–400)

## 2024-10-21 SURGERY — CORONARY ARTERY BYPASS GRAFTING (CABG)
Anesthesia: General | Site: Chest

## 2024-10-21 MED ORDER — THROMBIN 20000 UNITS EX SOLR: CUTANEOUS | Status: DC | PRN

## 2024-10-21 MED ORDER — MIDAZOLAM HCL (PF) 10 MG/2ML IJ SOLN
INTRAMUSCULAR | Status: AC
  Filled 2024-10-21: qty 2

## 2024-10-21 MED ORDER — PLASMA-LYTE A IV SOLN: INTRAVENOUS | Status: DC | PRN

## 2024-10-21 MED ORDER — OXYCODONE HCL 5 MG PO TABS
5.0000 mg | ORAL_TABLET | ORAL | Status: DC | PRN
  Administered 2024-10-21: 10 mg via ORAL
  Administered 2024-10-21: 5 mg via ORAL
  Administered 2024-10-22: 10 mg via ORAL
  Administered 2024-10-23: 5 mg via ORAL
  Administered 2024-10-23 – 2024-10-26 (×5): 10 mg via ORAL
  Filled 2024-10-21: qty 1
  Filled 2024-10-21: qty 2
  Filled 2024-10-21: qty 1
  Filled 2024-10-21 (×6): qty 2

## 2024-10-21 MED ORDER — ROCURONIUM BROMIDE 10 MG/ML (PF) SYRINGE
PREFILLED_SYRINGE | INTRAVENOUS | Status: DC | PRN
  Administered 2024-10-21: 30 mg via INTRAVENOUS
  Administered 2024-10-21: 80 mg via INTRAVENOUS
  Administered 2024-10-21: 50 mg via INTRAVENOUS
  Administered 2024-10-21: 100 mg via INTRAVENOUS

## 2024-10-21 MED ORDER — ROCURONIUM BROMIDE 10 MG/ML (PF) SYRINGE
PREFILLED_SYRINGE | INTRAVENOUS | Status: AC
  Filled 2024-10-21: qty 20

## 2024-10-21 MED ORDER — FENTANYL CITRATE (PF) 250 MCG/5ML IJ SOLN
INTRAMUSCULAR | Status: AC
  Filled 2024-10-21: qty 5

## 2024-10-21 MED ORDER — ONDANSETRON HCL 4 MG/2ML IJ SOLN: 4.0000 mg | Freq: Four times a day (QID) | INTRAMUSCULAR | Status: DC | PRN

## 2024-10-21 MED ORDER — HEPARIN SODIUM (PORCINE) 1000 UNIT/ML IJ SOLN
INTRAMUSCULAR | Status: AC
  Filled 2024-10-21: qty 1

## 2024-10-21 MED ORDER — PROPOFOL 10 MG/ML IV BOLUS
INTRAVENOUS | Status: AC
  Filled 2024-10-21: qty 20

## 2024-10-21 MED ORDER — MIDAZOLAM HCL (PF) 2 MG/2ML IJ SOLN: 2.0000 mg | INTRAMUSCULAR | Status: DC | PRN

## 2024-10-21 MED ORDER — LACTATED RINGERS IV SOLN: INTRAVENOUS | Status: DC

## 2024-10-21 MED ORDER — LACTATED RINGERS IV SOLN: INTRAVENOUS | Status: DC | PRN

## 2024-10-21 MED ORDER — ORAL CARE MOUTH RINSE: 15.0000 mL | Freq: Once | OROMUCOSAL | Status: AC

## 2024-10-21 MED ORDER — NITROGLYCERIN IN D5W 200-5 MCG/ML-% IV SOLN: 0.0000 ug/min | INTRAVENOUS | Status: DC

## 2024-10-21 MED ORDER — BISACODYL 5 MG PO TBEC
10.0000 mg | DELAYED_RELEASE_TABLET | Freq: Every day | ORAL | Status: DC
  Administered 2024-10-22 – 2024-10-27 (×6): 10 mg via ORAL
  Filled 2024-10-21 (×6): qty 2

## 2024-10-21 MED ORDER — ASPIRIN 325 MG PO TBEC
325.0000 mg | DELAYED_RELEASE_TABLET | Freq: Every day | ORAL | Status: DC
  Administered 2024-10-22 – 2024-10-27 (×6): 325 mg via ORAL
  Filled 2024-10-21 (×6): qty 1

## 2024-10-21 MED ORDER — PHENYLEPHRINE 80 MCG/ML (10ML) SYRINGE FOR IV PUSH (FOR BLOOD PRESSURE SUPPORT)
PREFILLED_SYRINGE | INTRAVENOUS | Status: DC | PRN
  Administered 2024-10-21: 160 ug via INTRAVENOUS
  Administered 2024-10-21: 80 ug via INTRAVENOUS
  Administered 2024-10-21: 160 ug via INTRAVENOUS
  Administered 2024-10-21 (×2): 80 ug via INTRAVENOUS
  Administered 2024-10-21: 160 ug via INTRAVENOUS
  Administered 2024-10-21 (×2): 80 ug via INTRAVENOUS

## 2024-10-21 MED ORDER — THROMBIN (RECOMBINANT) 20000 UNITS EX SOLR
CUTANEOUS | Status: AC
  Filled 2024-10-21: qty 20000

## 2024-10-21 MED ORDER — CHLORHEXIDINE GLUCONATE 4 % EX SOLN: 30.0000 mL | CUTANEOUS | Status: DC

## 2024-10-21 MED ORDER — SODIUM CHLORIDE 0.45 % IV SOLN: INTRAVENOUS | Status: AC | PRN

## 2024-10-21 MED ORDER — HEMOSTATIC AGENTS (NO CHARGE) OPTIME
TOPICAL | Status: DC | PRN
  Administered 2024-10-21: 1 via TOPICAL

## 2024-10-21 MED ORDER — CHLORHEXIDINE GLUCONATE 0.12 % MT SOLN
15.0000 mL | Freq: Once | OROMUCOSAL | Status: DC
  Filled 2024-10-21: qty 15

## 2024-10-21 MED ORDER — DEXTROSE 50 % IV SOLN: 0.0000 mL | INTRAVENOUS | Status: DC | PRN

## 2024-10-21 MED ORDER — ASPIRIN 81 MG PO CHEW
324.0000 mg | CHEWABLE_TABLET | Freq: Once | ORAL | Status: AC
  Administered 2024-10-21: 324 mg via ORAL
  Filled 2024-10-21: qty 4

## 2024-10-21 MED ORDER — ACETAMINOPHEN 500 MG PO TABS
1000.0000 mg | ORAL_TABLET | Freq: Four times a day (QID) | ORAL | Status: AC
  Administered 2024-10-21 – 2024-10-26 (×19): 1000 mg via ORAL
  Filled 2024-10-21 (×19): qty 2

## 2024-10-21 MED ORDER — LIDOCAINE 2% (20 MG/ML) 5 ML SYRINGE
INTRAMUSCULAR | Status: AC
  Filled 2024-10-21: qty 5

## 2024-10-21 MED ORDER — PROTAMINE SULFATE 10 MG/ML IV SOLN
INTRAVENOUS | Status: DC | PRN
  Administered 2024-10-21: 370 mg via INTRAVENOUS

## 2024-10-21 MED ORDER — SODIUM CHLORIDE 0.9 % IV SOLN: INTRAVENOUS | Status: DC | PRN

## 2024-10-21 MED ORDER — MIDAZOLAM HCL (PF) 5 MG/ML IJ SOLN
INTRAMUSCULAR | Status: DC | PRN
  Administered 2024-10-21: 2 mg via INTRAVENOUS
  Administered 2024-10-21: 1 mg via INTRAVENOUS
  Administered 2024-10-21 (×2): 2 mg via INTRAVENOUS

## 2024-10-21 MED ORDER — EPHEDRINE SULFATE-NACL 50-0.9 MG/10ML-% IV SOSY
PREFILLED_SYRINGE | INTRAVENOUS | Status: DC | PRN
  Administered 2024-10-21 (×2): 5 mg via INTRAVENOUS

## 2024-10-21 MED ORDER — METOPROLOL TARTRATE 12.5 MG HALF TABLET: 12.5000 mg | ORAL_TABLET | Freq: Two times a day (BID) | ORAL | Status: DC

## 2024-10-21 MED ORDER — MORPHINE SULFATE (PF) 2 MG/ML IV SOLN
1.0000 mg | INTRAVENOUS | Status: DC | PRN
  Administered 2024-10-21: 4 mg via INTRAVENOUS
  Administered 2024-10-21 – 2024-10-22 (×2): 2 mg via INTRAVENOUS
  Administered 2024-10-22 (×2): 4 mg via INTRAVENOUS
  Administered 2024-10-22: 2 mg via INTRAVENOUS
  Filled 2024-10-21 (×2): qty 2
  Filled 2024-10-21 (×2): qty 1
  Filled 2024-10-21 (×2): qty 2

## 2024-10-21 MED ORDER — POTASSIUM CHLORIDE 10 MEQ/50ML IV SOLN: 10.0000 meq | INTRAVENOUS | Status: AC

## 2024-10-21 MED ORDER — CHLORHEXIDINE GLUCONATE 0.12 % MT SOLN
OROMUCOSAL | Status: AC
  Administered 2024-10-21: 15 mL via OROMUCOSAL
  Filled 2024-10-21: qty 15

## 2024-10-21 MED ORDER — TAMSULOSIN HCL 0.4 MG PO CAPS
0.4000 mg | ORAL_CAPSULE | Freq: Every day | ORAL | Status: DC
  Administered 2024-10-22 – 2024-10-26 (×5): 0.4 mg via ORAL
  Filled 2024-10-21 (×5): qty 1

## 2024-10-21 MED ORDER — PROPOFOL 10 MG/ML IV BOLUS
INTRAVENOUS | Status: DC | PRN
  Administered 2024-10-21: 20 mg via INTRAVENOUS
  Administered 2024-10-21: 30 mg via INTRAVENOUS
  Administered 2024-10-21: 100 mg via INTRAVENOUS

## 2024-10-21 MED ORDER — ~~LOC~~ CARDIAC SURGERY, PATIENT & FAMILY EDUCATION
Freq: Once | Status: DC
  Filled 2024-10-21: qty 1

## 2024-10-21 MED ORDER — METOPROLOL TARTRATE 12.5 MG HALF TABLET: 12.5000 mg | ORAL_TABLET | Freq: Once | ORAL | Status: DC

## 2024-10-21 MED ORDER — SODIUM CHLORIDE 0.9% FLUSH: 3.0000 mL | INTRAVENOUS | Status: DC | PRN

## 2024-10-21 MED ORDER — INSULIN REGULAR(HUMAN) IN NACL 100-0.9 UT/100ML-% IV SOLN: INTRAVENOUS | Status: DC

## 2024-10-21 MED ORDER — PHENYLEPHRINE 80 MCG/ML (10ML) SYRINGE FOR IV PUSH (FOR BLOOD PRESSURE SUPPORT)
PREFILLED_SYRINGE | INTRAVENOUS | Status: AC
  Filled 2024-10-21: qty 10

## 2024-10-21 MED ORDER — ALBUMIN HUMAN 5 % IV SOLN
250.0000 mL | INTRAVENOUS | Status: DC | PRN
  Administered 2024-10-21 (×3): 12.5 g via INTRAVENOUS
  Filled 2024-10-21: qty 250

## 2024-10-21 MED ORDER — PANTOPRAZOLE SODIUM 40 MG PO TBEC
40.0000 mg | DELAYED_RELEASE_TABLET | Freq: Every day | ORAL | Status: DC
  Administered 2024-10-23 – 2024-10-27 (×5): 40 mg via ORAL
  Filled 2024-10-21 (×5): qty 1

## 2024-10-21 MED ORDER — BISACODYL 10 MG RE SUPP: 10.0000 mg | Freq: Every day | RECTAL | Status: DC

## 2024-10-21 MED ORDER — HEPARIN SODIUM (PORCINE) 1000 UNIT/ML IJ SOLN
INTRAMUSCULAR | Status: DC | PRN
  Administered 2024-10-21: 37000 [IU] via INTRAVENOUS

## 2024-10-21 MED ORDER — FENTANYL CITRATE (PF) 250 MCG/5ML IJ SOLN
INTRAMUSCULAR | Status: DC | PRN
  Administered 2024-10-21: 200 ug via INTRAVENOUS
  Administered 2024-10-21 (×5): 50 ug via INTRAVENOUS
  Administered 2024-10-21: 100 ug via INTRAVENOUS
  Administered 2024-10-21: 50 ug via INTRAVENOUS
  Administered 2024-10-21: 100 ug via INTRAVENOUS

## 2024-10-21 MED ORDER — VANCOMYCIN HCL IN DEXTROSE 1-5 GM/200ML-% IV SOLN
1000.0000 mg | Freq: Once | INTRAVENOUS | Status: AC
  Administered 2024-10-21: 1000 mg via INTRAVENOUS
  Filled 2024-10-21: qty 200

## 2024-10-21 MED ORDER — PHENYLEPHRINE HCL-NACL 20-0.9 MG/250ML-% IV SOLN
0.0000 ug/min | INTRAVENOUS | Status: DC
  Administered 2024-10-21: 60 ug/min via INTRAVENOUS
  Administered 2024-10-22: 30 ug/min via INTRAVENOUS
  Filled 2024-10-21 (×2): qty 250

## 2024-10-21 MED ORDER — METOPROLOL TARTRATE 5 MG/5ML IV SOLN: 2.5000 mg | INTRAVENOUS | Status: DC | PRN

## 2024-10-21 MED ORDER — ORAL CARE MOUTH RINSE
15.0000 mL | OROMUCOSAL | Status: DC
  Administered 2024-10-21 – 2024-10-22 (×9): 15 mL via OROMUCOSAL

## 2024-10-21 MED ORDER — ALBUMIN HUMAN 5 % IV SOLN: INTRAVENOUS | Status: DC | PRN

## 2024-10-21 MED ORDER — DEXMEDETOMIDINE HCL IN NACL 400 MCG/100ML IV SOLN: 0.0000 ug/kg/h | INTRAVENOUS | Status: DC

## 2024-10-21 MED ORDER — CEFAZOLIN SODIUM-DEXTROSE 2-4 GM/100ML-% IV SOLN
2.0000 g | Freq: Three times a day (TID) | INTRAVENOUS | Status: AC
  Administered 2024-10-21 – 2024-10-23 (×6): 2 g via INTRAVENOUS
  Filled 2024-10-21 (×6): qty 100

## 2024-10-21 MED ORDER — METOPROLOL TARTRATE 25 MG/10 ML ORAL SUSPENSION: 12.5000 mg | Freq: Two times a day (BID) | ORAL | Status: DC

## 2024-10-21 MED ORDER — PANTOPRAZOLE SODIUM 40 MG IV SOLR
40.0000 mg | Freq: Every day | INTRAVENOUS | Status: AC
  Administered 2024-10-21 – 2024-10-22 (×2): 40 mg via INTRAVENOUS
  Filled 2024-10-21 (×2): qty 10

## 2024-10-21 MED ORDER — TRAMADOL HCL 50 MG PO TABS
50.0000 mg | ORAL_TABLET | ORAL | Status: DC | PRN
  Administered 2024-10-22: 100 mg via ORAL
  Filled 2024-10-21: qty 2

## 2024-10-21 MED ORDER — METOCLOPRAMIDE HCL 5 MG/ML IJ SOLN
10.0000 mg | Freq: Four times a day (QID) | INTRAMUSCULAR | Status: AC
  Administered 2024-10-21 – 2024-10-22 (×6): 10 mg via INTRAVENOUS
  Filled 2024-10-21 (×5): qty 2

## 2024-10-21 MED ORDER — SODIUM CHLORIDE 0.9% FLUSH
3.0000 mL | Freq: Two times a day (BID) | INTRAVENOUS | Status: DC
  Administered 2024-10-22 – 2024-10-23 (×4): 3 mL via INTRAVENOUS

## 2024-10-21 MED ORDER — ATORVASTATIN CALCIUM 40 MG PO TABS
40.0000 mg | ORAL_TABLET | Freq: Every morning | ORAL | Status: DC
  Administered 2024-10-22 – 2024-10-27 (×6): 40 mg via ORAL
  Filled 2024-10-21 (×8): qty 1

## 2024-10-21 MED ORDER — CHLORHEXIDINE GLUCONATE CLOTH 2 % EX PADS
6.0000 | MEDICATED_PAD | Freq: Every day | CUTANEOUS | Status: DC
  Administered 2024-10-22 – 2024-10-24 (×2): 6 via TOPICAL

## 2024-10-21 MED ORDER — CHLORHEXIDINE GLUCONATE 0.12 % MT SOLN
15.0000 mL | Freq: Once | OROMUCOSAL | Status: AC
  Administered 2024-10-21: 15 mL via OROMUCOSAL

## 2024-10-21 MED ORDER — CHLORHEXIDINE GLUCONATE CLOTH 2 % EX PADS
6.0000 | MEDICATED_PAD | Freq: Every day | CUTANEOUS | Status: DC
  Administered 2024-10-21 – 2024-10-27 (×4): 6 via TOPICAL

## 2024-10-21 MED ORDER — CHLORHEXIDINE GLUCONATE 0.12 % MT SOLN
15.0000 mL | OROMUCOSAL | Status: AC
  Filled 2024-10-21: qty 15

## 2024-10-21 MED ORDER — ASPIRIN 81 MG PO CHEW: 324.0000 mg | CHEWABLE_TABLET | Freq: Every day | ORAL | Status: DC

## 2024-10-21 MED ORDER — TRANEXAMIC ACID 1000 MG/10ML IV SOLN
1.5000 mg/kg/h | INTRAVENOUS | Status: DC
  Administered 2024-10-21: 1.5 mg/kg/h via INTRAVENOUS
  Filled 2024-10-21: qty 25

## 2024-10-21 MED ORDER — ACETAMINOPHEN 160 MG/5ML PO SOLN: 1000.0000 mg | Freq: Four times a day (QID) | ORAL | Status: DC

## 2024-10-21 MED ORDER — ALLOPURINOL 100 MG PO TABS
100.0000 mg | ORAL_TABLET | Freq: Every morning | ORAL | Status: DC
  Administered 2024-10-22 – 2024-10-27 (×6): 100 mg via ORAL
  Filled 2024-10-21 (×7): qty 1

## 2024-10-21 MED ORDER — ACETAMINOPHEN 160 MG/5ML PO SOLN
650.0000 mg | Freq: Once | ORAL | Status: AC
  Administered 2024-10-21: 650 mg
  Filled 2024-10-21: qty 20.3

## 2024-10-21 MED ORDER — LACTATED RINGERS IV SOLN: INTRAVENOUS | Status: AC

## 2024-10-21 MED ORDER — SODIUM CHLORIDE 0.9 % IV SOLN: INTRAVENOUS | Status: DC

## 2024-10-21 MED ORDER — FINASTERIDE 5 MG PO TABS
5.0000 mg | ORAL_TABLET | Freq: Every day | ORAL | Status: DC
  Administered 2024-10-22 – 2024-10-27 (×6): 5 mg via ORAL
  Filled 2024-10-21 (×8): qty 1

## 2024-10-21 MED ORDER — DOCUSATE SODIUM 100 MG PO CAPS
200.0000 mg | ORAL_CAPSULE | Freq: Every day | ORAL | Status: DC
  Administered 2024-10-22 – 2024-10-27 (×6): 200 mg via ORAL
  Filled 2024-10-21 (×6): qty 2

## 2024-10-21 MED ORDER — SODIUM CHLORIDE 0.9 % IV SOLN
250.0000 mL | INTRAVENOUS | Status: AC
  Administered 2024-10-21: 250 mL via INTRAVENOUS

## 2024-10-21 MED ORDER — ORAL CARE MOUTH RINSE: 15.0000 mL | OROMUCOSAL | Status: DC | PRN

## 2024-10-21 MED ORDER — PROTAMINE SULFATE 10 MG/ML IV SOLN
INTRAVENOUS | Status: AC
  Filled 2024-10-21: qty 25

## 2024-10-21 MED ORDER — EPHEDRINE 5 MG/ML INJ
INTRAVENOUS | Status: AC
  Filled 2024-10-21: qty 5

## 2024-10-21 MED ORDER — HEPARIN SODIUM (PORCINE) 1000 UNIT/ML IJ SOLN
INTRAMUSCULAR | Status: AC
  Filled 2024-10-21: qty 10

## 2024-10-21 MED ORDER — MAGNESIUM SULFATE 4 GM/100ML IV SOLN
4.0000 g | Freq: Once | INTRAVENOUS | Status: AC
  Administered 2024-10-21: 4 g via INTRAVENOUS
  Filled 2024-10-21: qty 100

## 2024-10-21 MED ORDER — 0.9 % SODIUM CHLORIDE (POUR BTL) OPTIME
TOPICAL | Status: DC | PRN
  Administered 2024-10-21: 5000 mL

## 2024-10-21 MED FILL — Heparin Sodium (Porcine) Inj 1000 Unit/ML: Qty: 1000 | Status: AC

## 2024-10-21 MED FILL — Potassium Chloride Inj 2 mEq/ML: INTRAVENOUS | Qty: 40 | Status: AC

## 2024-10-21 MED FILL — Magnesium Sulfate Inj 50%: INTRAMUSCULAR | Qty: 10 | Status: AC

## 2024-10-21 SURGICAL SUPPLY — 79 items
BAG DECANTER FOR FLEXI CONT (MISCELLANEOUS) ×2 IMPLANT
BLADE CLIPPER SURG (BLADE) ×2 IMPLANT
BLADE STERNUM SYSTEM 6 (BLADE) ×2 IMPLANT
BNDG ELASTIC 4INX 5YD STR LF (GAUZE/BANDAGES/DRESSINGS) IMPLANT
BNDG ELASTIC 4X5.8 VLCR STR LF (GAUZE/BANDAGES/DRESSINGS) ×2 IMPLANT
BNDG ELASTIC 6INX 5YD STR LF (GAUZE/BANDAGES/DRESSINGS) ×2 IMPLANT
BNDG GAUZE DERMACEA FLUFF 4 (GAUZE/BANDAGES/DRESSINGS) ×2 IMPLANT
CANISTER SUCTION 3000ML PPV (SUCTIONS) ×2 IMPLANT
CANNULA AORTIC ROOT 9FR (CANNULA) ×2 IMPLANT
CANNULA ARTERIAL NVNT 3/8 22FR (MISCELLANEOUS) IMPLANT
CANNULA MC2 2 STG 36/46 NON-V (CANNULA) IMPLANT
CATH ROBINSON RED A/P 18FR (CATHETERS) ×4 IMPLANT
CATH THORACIC 28FR (CATHETERS) ×2 IMPLANT
CATH THORACIC 36FR (CATHETERS) ×2 IMPLANT
CATH THORACIC 36FR RT ANG (CATHETERS) ×2 IMPLANT
CLIP TI WIDE RED SMALL 24 (CLIP) IMPLANT
CONTAINER PROTECT SURGISLUSH (MISCELLANEOUS) ×4 IMPLANT
DERMABOND ADVANCED .7 DNX12 (GAUZE/BANDAGES/DRESSINGS) IMPLANT
DRAPE SRG 135X102X78XABS (DRAPES) ×2 IMPLANT
DRAPE WARM FLUID 44X44 (DRAPES) ×2 IMPLANT
DRSG COVADERM 4X14 (GAUZE/BANDAGES/DRESSINGS) ×2 IMPLANT
ELECT CAUTERY BLADE 6.4 (BLADE) ×2 IMPLANT
ELECTRODE BLDE 4.0 EZ CLN MEGD (MISCELLANEOUS) ×2 IMPLANT
ELECTRODE REM PT RTRN 9FT ADLT (ELECTROSURGICAL) ×4 IMPLANT
FELT TEFLON 1X6 (MISCELLANEOUS) ×2 IMPLANT
GAUZE SPONGE 4X4 12PLY STRL (GAUZE/BANDAGES/DRESSINGS) ×4 IMPLANT
GAUZE SPONGE 4X4 12PLY STRL LF (GAUZE/BANDAGES/DRESSINGS) IMPLANT
GLOVE BIO SURGEON STRL SZ 6 (GLOVE) IMPLANT
GLOVE BIO SURGEON STRL SZ 6.5 (GLOVE) IMPLANT
GLOVE BIOGEL PI IND STRL 6.5 (GLOVE) IMPLANT
GLOVE BIOGEL PI IND STRL 7.0 (GLOVE) IMPLANT
GLOVE ECLIPSE 7.0 STRL STRAW (GLOVE) ×4 IMPLANT
GLOVE PI ORTHO PRO STRL 7.5 (GLOVE) IMPLANT
GOWN STRL REUS W/ TWL LRG LVL3 (GOWN DISPOSABLE) ×8 IMPLANT
GOWN STRL REUS W/ TWL XL LVL3 (GOWN DISPOSABLE) ×2 IMPLANT
HEMOSTAT POWDER SURGIFOAM 1G (HEMOSTASIS) ×6 IMPLANT
HEMOSTAT SURGICEL 2X14 (HEMOSTASIS) ×2 IMPLANT
KIT BASIN OR (CUSTOM PROCEDURE TRAY) ×2 IMPLANT
KIT SUCTION CATH 14FR (SUCTIONS) ×2 IMPLANT
KIT TURNOVER KIT B (KITS) ×2 IMPLANT
KIT VASOVIEW HEMOPRO 2 VH 4000 (KITS) ×2 IMPLANT
KNIFE MICRO-UNI 3.5 30 DEG (BLADE) ×2 IMPLANT
PACK E OPEN HEART (SUTURE) ×2 IMPLANT
PACK OPEN HEART (CUSTOM PROCEDURE TRAY) ×2 IMPLANT
PAD ARMBOARD POSITIONER FOAM (MISCELLANEOUS) ×4 IMPLANT
PAD ELECT DEFIB RADIOL ZOLL (MISCELLANEOUS) ×2 IMPLANT
PENCIL BUTTON HOLSTER BLD 10FT (ELECTRODE) ×2 IMPLANT
POSITIONER HEAD DONUT 9IN (MISCELLANEOUS) ×2 IMPLANT
PUNCH AORTIC ROTATE 4.5MM 8IN (MISCELLANEOUS) ×2 IMPLANT
SET MPS 3-ND DEL (MISCELLANEOUS) IMPLANT
SOLN 0.9% NACL POUR BTL 1000ML (IV SOLUTION) ×10 IMPLANT
SOLN STERILE WATER BTL 1000 ML (IV SOLUTION) ×4 IMPLANT
SPONGE T-LAP 18X18 ~~LOC~~+RFID (SPONGE) IMPLANT
SPONGE T-LAP 4X18 ~~LOC~~+RFID (SPONGE) IMPLANT
SUPPORT HEART JANKE-BARRON (MISCELLANEOUS) ×2 IMPLANT
SUT BONE WAX W31G (SUTURE) ×2 IMPLANT
SUT MNCRL AB 4-0 PS2 18 (SUTURE) IMPLANT
SUT PROLENE 3 0 SH1 36 (SUTURE) ×2 IMPLANT
SUT PROLENE 4 0 SH DA (SUTURE) IMPLANT
SUT PROLENE 4-0 RB1 .5 CRCL 36 (SUTURE) IMPLANT
SUT PROLENE 5 0 C 1 36 (SUTURE) IMPLANT
SUT PROLENE 6 0 C 1 30 (SUTURE) IMPLANT
SUT PROLENE 7 0 BV 1 (SUTURE) IMPLANT
SUT PROLENE 7 0 BV1 MDA (SUTURE) ×2 IMPLANT
SUT PROLENE 8 0 BV175 6 (SUTURE) IMPLANT
SUT SILK 1 MH (SUTURE) IMPLANT
SUT STEEL STERNAL CCS#1 18IN (SUTURE) IMPLANT
SUT STEEL SZ 6 DBL 3X14 BALL (SUTURE) IMPLANT
SUT VIC AB 1 CTX36XBRD ANBCTR (SUTURE) ×4 IMPLANT
SUT VIC AB 2-0 CT1 TAPERPNT 27 (SUTURE) IMPLANT
SYSTEM SAHARA CHEST DRAIN ATS (WOUND CARE) ×2 IMPLANT
TAPE PAPER 3X10 WHT MICROPORE (GAUZE/BANDAGES/DRESSINGS) IMPLANT
TOWEL GREEN STERILE (TOWEL DISPOSABLE) ×2 IMPLANT
TOWEL GREEN STERILE FF (TOWEL DISPOSABLE) ×2 IMPLANT
TRAY FOLEY SLVR 16FR TEMP STAT (SET/KITS/TRAYS/PACK) ×2 IMPLANT
TUBE SUCT INTRACARD DLP 20F (MISCELLANEOUS) ×2 IMPLANT
TUBE SUCTION CARDIAC 10FR (CANNULA) ×2 IMPLANT
TUBING LAP HI FLOW INSUFFLATIO (TUBING) ×2 IMPLANT
UNDERPAD 30X36 HEAVY ABSORB (UNDERPADS AND DIAPERS) ×2 IMPLANT

## 2024-10-21 NOTE — Brief Op Note (Signed)
 10/21/2024  11:57 AM  PATIENT:  [name] [name]  78 y.o. male  PRE-OPERATIVE DIAGNOSIS:  CORONARY ARTERY DISEASE  POST-OPERATIVE DIAGNOSIS:  CORONARY ARTERY DISEASE  PROCEDURE:   CORONARY ARTERY BYPASS GRAFTING X3 USING LEFT INTERNAL MAMMARY ARTERY AND ENDOSCOPICALLY HARVESTED RIGHT SAPHENOUS VEIN   LIMA-LAD SVG-OM1 SVG-OM3   Vein harvest time: Vein prep time:  ECHOCARDIOGRAM, TRANSESOPHAGEAL, INTRAOPERATIVE   SURGEON:  [name] Dorise POUR, MD   PHYSICIAN ASSISTANT: Ashtynn Berke  ASSISTANTS: Winnie Norris, RN, Scrub Person         Lawernce Suzen LABOR, RN, RN First Assistant   ANESTHESIA:   general  EBL:   BLOOD ADMINISTERED:none  DRAINS: Left pleural and mediastinal drains   LOCAL MEDICATIONS USED:  NONE  COUNTS:  Correct  DICTATION: .Dragon Dictation  PLAN OF CARE: Admit to inpatient   PATIENT DISPOSITION:  ICU - intubated and hemodynamically stable.   Delay start of Pharmacological VTE agent (>24hrs) due to surgical blood loss or risk of bleeding: yes

## 2024-10-21 NOTE — Interval H&P Note (Signed)
 History and Physical Interval Note:  10/21/2024 6:36 AM  [name] [name].  has presented today for surgery, with the diagnosis of CAD.  The various methods of treatment have been discussed with the patient and family. After consideration of risks, benefits and other options for treatment, the patient has consented to  Procedures: CORONARY ARTERY BYPASS GRAFTING (CABG) (N/A) ECHOCARDIOGRAM, TRANSESOPHAGEAL, INTRAOPERATIVE (N/A) as a surgical intervention.  The patient's history has been reviewed, patient examined, no change in status, stable for surgery.  I have reviewed the patient's chart and labs.  Questions were answered to the patient's satisfaction.     Rad Gramling K Shaylon Gillean

## 2024-10-21 NOTE — Op Note (Signed)
 CARDIOVASCULAR SURGERY OPERATIVE NOTE  10/21/2024  Surgeon:  Dorise LOIS Fellers, MD  First Assistant: Laurel Becket,  PA-C:   An experienced assistant was required given the complexity of this surgery and the standard of surgical care. The assistant was needed for endoscopic vein harvest, exposure, dissection, suctioning, retraction of delicate tissues and sutures, instrument exchange and for overall help during this procedure.   Preoperative Diagnosis:  Severe multi-vessel coronary artery disease   Postoperative Diagnosis:  Same   Procedure:  Median Sternotomy Extracorporeal circulation 3.   Coronary artery bypass grafting x 3  Left internal mammary artery graft to the LAD SVG to OM 2 SVG to OM3  4.   Endoscopic vein harvest from the right leg   Anesthesia:  General Endotracheal   Clinical History/Surgical Indication:  This 78 year old gentleman has severe three-vessel coronary disease with progressive exertional angina and shortness of breath that is lifestyle limiting. I agree that coronary artery bypass graft surgery is the best treatment for resolution of his symptoms and to prevent myocardial loss. I discussed the operative procedure with the patient and family including alternatives, benefits and risks; including but not limited to bleeding, blood transfusion, infection, stroke, myocardial infarction, graft failure, heart block requiring a permanent pacemaker, organ dysfunction, and death. [name] [name]. understands and agrees to proceed.   Preparation:  The patient was seen in the preoperative holding area and the correct patient, correct operation were confirmed with the patient after reviewing the medical record and catheterization. The consent was signed by me. Preoperative antibiotics were given. A pulmonary arterial line and radial arterial line were placed by the  anesthesia team. The patient was taken back to the operating room and positioned supine on the operating room table. After being placed under general endotracheal anesthesia by the anesthesia team a foley catheter was placed. The neck, chest, abdomen, and both legs were prepped with betadine soap and solution and draped in the usual sterile manner. A surgical time-out was taken and the correct patient and operative procedure were confirmed with the nursing and anesthesia staff.   Cardiopulmonary Bypass:  A median sternotomy was performed. The pericardium was opened in the midline. Right ventricular function appeared normal. The ascending aorta was of normal size and had no palpable plaque. There were no contraindications to aortic cannulation or cross-clamping. The patient was fully systemically heparinized and the ACT was maintained > 400 sec. The proximal aortic arch was cannulated with a 22 F aortic cannula for arterial inflow. Venous cannulation was performed via the right atrial appendage using a two-staged venous cannula. An antegrade cardioplegia/vent cannula was inserted into the mid-ascending aorta. Aortic occlusion was performed with a single cross-clamp. Systemic cooling to 32 degrees Centigrade and topical cooling of the heart with iced saline were used. Hyperkalemic antegrade cold blood cardioplegia was used to induce diastolic arrest and was then given at about 20 minute intervals throughout the period of arrest to maintain myocardial temperature at or below 10 degrees centigrade. A temperature probe was inserted into the interventricular septum and an insulating pad was placed in the pericardium.   Left internal mammary artery harvest:  The left side of the sternum was retracted using the Rultract retractor. The left internal mammary artery was harvested as a pedicle graft. All side branches were clipped. It was a medium-sized vessel of good quality with excellent blood flow. It was ligated  distally and divided. It was sprayed with topical papaverine solution to prevent vasospasm.   Endoscopic vein  harvest: Performed by Laurel Becket, PA-C  The right greater saphenous vein was harvested endoscopically through a 2 cm incision medial to the right knee. It was harvested from the upper thigh to below the knee. It was a medium-sized vein of good quality. The side branches were all ligated with 4-0 silk ties.    Coronary arteries:  The coronary arteries were examined.  LAD:  medium caliber vessel distally with minimal disease. The diagonal was diffusely diseased with no spot soft enough to open so it was not grafted. LCX:  small OM1, large occluded OM2 with some thickening of vessel wall but no significant plaque distally. The OM3 was a large vessel with no disease. RCA:  Diffusely diseased and the PDA itself was small and diffusely diseased. Not grafted.   Grafts: Assisted by Laurel Becket, PA-C  LIMA to the LAD: 1.75 mm. It was sewn end to side using 8-0 prolene continuous suture. SVG to OM2:  2.5 mm. It was sewn end to side using 7-0 prolene continuous suture. SVG to OM3:  2.5 mm. It was sewn end to side using 7-0 prolene continuous suture.  The proximal vein graft anastomoses were performed to the mid-ascending aorta using continuous 6-0 prolene suture. Graft markers were placed around the proximal anastomoses.   Completion:  The patient was rewarmed to 37 degrees Centigrade. The clamp was removed from the LIMA pedicle and there was rapid warming of the septum and return of ventricular fibrillation. The crossclamp was removed with a time of 68 minutes. There was spontaneous return of sinus rhythm. The distal and proximal anastomoses were checked for hemostasis. The position of the grafts was satisfactory. Two temporary epicardial pacing wires were placed on the right atrium and two on the right ventricle. The patient was weaned from CPB without difficulty on no  inotropes. CPB time was 88 minutes.  Heparin  was fully reversed with protamine and the aortic and venous cannulas removed. Hemostasis was achieved. Mediastinal and left pleural drainage tubes were placed. The sternum was closed with double #6 stainless steel wires. The fascia was closed with continuous # 1 vicryl suture. The subcutaneous tissue was closed with 2-0 vicryl continuous suture. The skin was closed with 3-0 vicryl subcuticular suture. All sponge, needle, and instrument counts were reported correct at the end of the case. Dry sterile dressings were placed over the incisions and around the chest tubes which were connected to pleurevac suction. The patient was then transported to the surgical intensive care unit in stable condition.

## 2024-10-21 NOTE — Plan of Care (Signed)

## 2024-10-21 NOTE — Anesthesia Procedure Notes (Signed)
 Procedure Name: Intubation Date/Time: 10/21/2024 7:57 AM  Performed by: Elby Raelene SAUNDERS, CRNAPre-anesthesia Checklist: Patient identified, Emergency Drugs available, Suction available and Patient being monitored Patient Re-evaluated:Patient Re-evaluated prior to induction Oxygen Delivery Method: Circle System Utilized Preoxygenation: Pre-oxygenation with 100% oxygen Induction Type: IV induction Ventilation: Mask ventilation without difficulty Laryngoscope Size: Glidescope and 4 Grade View: Grade I Tube type: Oral Tube size: 8.0 mm Number of attempts: 1 Airway Equipment and Method: Stylet and Oral airway Placement Confirmation: ETT inserted through vocal cords under direct vision, positive ETCO2 and breath sounds checked- equal and bilateral Secured at: 23 cm Tube secured with: Tape Dental Injury: Teeth and Oropharynx as per pre-operative assessment

## 2024-10-21 NOTE — Progress Notes (Signed)
°  Echocardiogram Echocardiogram Transesophageal has been performed.  Devora Ellouise SAUNDERS 10/21/2024, 8:33 AM

## 2024-10-21 NOTE — Procedures (Signed)
 Extubation Procedure Note  Patient Details:   Name: [name] [name]. DOB: [birth year] MRN: [id number]   Airway Documentation:    Vent end date: 10/21/24 Vent end time: 1707   Evaluation  O2 sats: stable throughout Complications: No apparent complications Patient did tolerate procedure well. Bilateral Breath Sounds: Clear, Diminished   Patient extubated to 4L [place] and tolerating well at this time. Cuff leak present prior to extubating, no stridor noted post. NIF -25, VC 1.2L.     Yes  [name] [name] [name] 10/21/2024, 5:29 PM

## 2024-10-21 NOTE — Anesthesia Procedure Notes (Signed)
 Central Venous Catheter Insertion Performed by: Corinne Garnette BRAVO, MD, anesthesiologist Start/End12/09/2024 6:45 AM, 10/21/2024 6:55 AM Patient location: Pre-op. Preanesthetic checklist: patient identified, IV checked, site marked, risks and benefits discussed, surgical consent, monitors and equipment checked, pre-op evaluation, timeout performed and anesthesia consent Position: Trendelenburg Lidocaine  1% used for infiltration and patient sedated Hand hygiene performed , maximum sterile barriers used  and Seldinger technique used Catheter size: 9 Fr Central line was placed.MAC introducer Procedure performed using ultrasound to evaluate access site. Ultrasound Notes:relevant anatomy identified, ultrasound used to visualize needle entry, vessel patent under ultrasound and image(s) printed for medical record. Attempts: 1 Following insertion, line sutured, dressing applied and Biopatch. Post procedure assessment: free fluid flow, blood return through all ports and no air  Patient tolerated the procedure well with no immediate complications.

## 2024-10-21 NOTE — Transfer of Care (Signed)
 Immediate Anesthesia Transfer of Care Note  Patient: [name] [name].  Procedure(s) Performed: CORONARY ARTERY BYPASS GRAFTING, TIMES 3 USING LEFT INTERNAL MAMMARY ARTERY AND ENDOSCOPICALLY HARVESTED RIGHT SAPHENOUS VEIN (Chest) ECHOCARDIOGRAM, TRANSESOPHAGEAL, INTRAOPERATIVE  Patient Location: SICU  Anesthesia Type:General  Level of Consciousness: Patient remains intubated per anesthesia plan  Airway & Oxygen Therapy: Patient remains intubated per anesthesia plan and Patient placed on Ventilator (see vital sign flow sheet for setting)  Post-op Assessment: Report given to RN and Post -op Vital signs reviewed and stable  Post vital signs: Reviewed and stable  Last Vitals:  Vitals Value Taken Time  BP    Temp    Pulse 83 10/21/24 13:28  Resp 14 10/21/24 13:28  SpO2 100 % 10/21/24 13:28  Vitals shown include unfiled device data.  Last Pain:  Vitals:   10/21/24 0614  TempSrc:   PainSc: 0-No pain         Complications: No notable events documented.

## 2024-10-21 NOTE — Consult Note (Signed)
 NAME:  [name] [name]., MRN:  [id number], DOB:  [birth year], LOS: 0 ADMISSION DATE:  10/21/2024, CONSULTATION DATE:  10/21/24  REFERRING MD:  [name] CHIEF COMPLAINT:  CAD   History of Present Illness:  Pt is a 78 yr old male with significant hx of DM, HLD, HTN, and CAD s/p PTCA (1990s), cardiac cath on 09/20/24 showing severe 3 vessel CAD, who presents for a CABG x 3 by MD Bartle. PCCM Consulted to assist with post-op vent management and critical care while in ICU.   Xclamp: 68 mins  CPB: 88 mins   EBL: 150 cc  Albumin given: 250 cc   Cell saver: 210 cc    Pertinent  Medical History   Past Medical History:  Diagnosis Date   CAD (coronary artery disease)    Cancer (HCC)    Diabetes (HCC)    Dyslipidemia    Gout    History of kidney stones    HTN (hypertension)    Myocardial infarction (HCC)      Significant Hospital Events: Including procedures, antibiotic start and stop dates in addition to other pertinent events   12/11 CABG x 3  Interim History / Subjective:  POD 0   On low dose neo  Insulin  gtt  Dex  Txa   Objective    Blood pressure 113/69, pulse 77, temperature 98.6 F (37 C), temperature source Oral, resp. rate (!) 9, height 5' 9 (1.753 m), weight 104.9 kg, SpO2 95%. PAP: (14-33)/(6-23) 20/12      Intake/Output Summary (Last 24 hours) at 10/21/2024 1135 Last data filed at 10/21/2024 1129 Gross per 24 hour  Intake 1950 ml  Output 450 ml  Net 1500 ml   Filed Weights   10/21/24 0555  Weight: 104.9 kg    Examination: General: acute on chronic older adult male, lying in icu bed on vent in NAD, paralyzed  HEENT: Normocephalic, PERRLA intact, ETT, OG, Pink MM CV: s1,s2, RRR, no MRG, No JVD, 2 mediastinal chest tubes, 1 pleural chest tubes, pacing wires intact, not currently paced  pulm: clear, diminished, no distress on vent, paralyzed- not reversed  Abs: bs active, soft  Extremities: extremities intact, B/L LE- with compression wraps from vein  harvest- clean,dry,intact  Skin: no rash Neuro: Rass -4, paralyzed GU: foley intact, making urine- clear/yellow     Resolved problem list   Assessment and Plan  Severe 3 vessel CAD s/p CABG  LIMA to LAD, SVG to OM2, SVG to OM3  10/04/24- ECHO EF 60-65%, LV function normal, no regional wall abnormalities, LV hypertrophy-moderate, Grade 1 diastolic RV normal  P: Postoperative management per TCTS CT/ pacing wires management per protocol Wean pressors for map goal >65 Postoperative ancef /vanc for surgical ppx Continue ASA and statin Wean insulin  gtt per protocol Start BB once off pressors  Ensure adequate pain control-tramadol, oxycodone , and morphine  PT/OT tomorrow- utilizes cane at home   Acute respiratory insufficiency, postop P: Continue on protective ventilation VAP prevention bundle in place Once paralytic wears off and able to decrease sedation can begin  Rapid weaning protocol if patient is hemodynamically stable   Hypertension P: Holding antihypertensive for now, as patient is requiring low-dose neo  Continue to wean   Hyperlipidemia P: Continue statin   Diabetes type 2 P:  Currently on insulin  infusion, monitor fingerstick with goal 140-180  Expected perioperative blood loss anemia Thrombocytopenia due to CPB Hgb 9.3 Plts 161  P: Monitor H/H and PLT counts   Labs  CBC: Recent Labs  Lab 10/19/24 1220 10/21/24 0826 10/21/24 0829 10/21/24 1005 10/21/24 1043 10/21/24 1056 10/21/24 1103  WBC 9.0  --   --   --   --   --   --   HGB 12.8*   < > 11.2* 9.5* 8.8* 8.8* 8.8*  HCT 39.9   < > 33.0* 28.0* 26.0* 26.0* 26.0*  MCV 89.1  --   --   --   --   --   --   PLT 266  --   --   --   --   --   --    < > = values in this interval not displayed.    Basic Metabolic Panel: Recent Labs  Lab 10/19/24 1220 10/21/24 0826 10/21/24 0829 10/21/24 1005 10/21/24 1043 10/21/24 1056 10/21/24 1103  NA 138   < > 141 141 139 140 141  K 4.3   < > 3.9 3.9 3.9  4.1 4.2  CL 103  --  102 103  --   --  102  CO2 25  --   --   --   --   --   --   GLUCOSE 133*  --  177* 141*  --   --  139*  BUN 12  --  18 18  --   --  17  CREATININE 0.79  --  1.00 1.00  --   --  0.80  CALCIUM  8.8*  --   --   --   --   --   --    < > = values in this interval not displayed.   GFR: Estimated Creatinine Clearance: 92.3 mL/min (by C-G formula based on SCr of 0.8 mg/dL). Recent Labs  Lab 10/19/24 1220  WBC 9.0    Liver Function Tests: Recent Labs  Lab 10/19/24 1220  AST 20  ALT 16  ALKPHOS 61  BILITOT 0.8  PROT 7.1  ALBUMIN 3.9   No results for input(s): LIPASE, AMYLASE in the last 168 hours. No results for input(s): AMMONIA in the last 168 hours.  ABG    Component Value Date/Time   PHART 7.297 (L) 10/21/2024 1043   PCO2ART 49.3 (H) 10/21/2024 1043   PO2ART 417 (H) 10/21/2024 1043   HCO3 24.5 10/21/2024 1056   TCO2 26 10/21/2024 1103   ACIDBASEDEF 2.0 10/21/2024 1056   O2SAT 83 10/21/2024 1056     Coagulation Profile: Recent Labs  Lab 10/19/24 1220  INR 1.1    Cardiac Enzymes: No results for input(s): CKTOTAL, CKMB, CKMBINDEX, TROPONINI in the last 168 hours.  HbA1C: Hgb A1c MFr Bld  Date/Time Value Ref Range Status  10/19/2024 12:00 PM 8.5 (H) 4.8 - 5.6 % Final    Comment:    (NOTE) Diagnosis of Diabetes The following HbA1c ranges recommended by the American Diabetes Association (ADA) may be used as an aid in the diagnosis of diabetes mellitus.  Hemoglobin             Suggested A1C NGSP%              Diagnosis  <5.7                   Non Diabetic  5.7-6.4                Pre-Diabetic  >6.4                   Diabetic  <7.0  Glycemic control for                       adults with diabetes.    02/11/2022 05:50 AM 7.0 (H) 4.8 - 5.6 % Final    Comment:    (NOTE)         Prediabetes: 5.7 - 6.4         Diabetes: >6.4         Glycemic control for adults with diabetes: <7.0     CBG: Recent Labs   Lab 10/19/24 1117 10/21/24 0603  GLUCAP 148* 177*    Review of Systems:   See HPI   Past Medical History:  He,  has a past medical history of CAD (coronary artery disease), Cancer (HCC), Diabetes (HCC), Dyslipidemia, Gout, History of kidney stones, HTN (hypertension), and Myocardial infarction (HCC).   Surgical History:   Past Surgical History:  Procedure Laterality Date   ANTERIOR CERVICAL DECOMP/DISCECTOMY FUSION Left 10/20/2019   Procedure: Left Cervical Three-Four Cervical Four-Five Cervical Five-Six Anterior cervical decompression/discectomy/fusion;  Surgeon: Cheryle Debby LABOR, MD;  Location: [facility] OR;  Service: Neurosurgery;  Laterality: Left;  Left Cervical Three-Four Cervical Four-Five Cervical Five-Six Anterior cervical decompression/discectomy/fusion   BACK SURGERY     CORONARY PRESSURE/FFR WITH 3D MAPPING N/A 09/20/2024   Procedure: Coronary Pressure/FFR w/3D Mapping;  Surgeon: Mady Bruckner, MD;  Location: [facility] INVASIVE CV LAB;  Service: Cardiovascular;  Laterality: N/A;   LEFT HEART CATH AND CORONARY ANGIOGRAPHY N/A 09/20/2024   Procedure: LEFT HEART CATH AND CORONARY ANGIOGRAPHY;  Surgeon: Mady Bruckner, MD;  Location: [facility] INVASIVE CV LAB;  Service: Cardiovascular;  Laterality: N/A;   left knee surgery     left shoulder surgery     rotator cuff   POSTERIOR CERVICAL FUSION/FORAMINOTOMY N/A 09/17/2020   Procedure: CERVICAL THREE TO CERVICAL SIX POSTERIOR CERVICAL DECOMPRESSION;  Surgeon: Cheryle Debby LABOR, MD;  Location: [facility] OR;  Service: Neurosurgery;  Laterality: N/A;   right knee surgery       Social History:   reports that he quit smoking about 35 years ago. His smoking use included cigarettes. He started smoking about 71 years ago. He has a 180 pack-year smoking history. He has never been exposed to tobacco smoke. His smokeless tobacco use includes snuff. He reports that he does not drink alcohol and does not use drugs.   Family History:  His family history  includes Colon cancer (age of onset: 40) in his brother; Heart attack (age of onset: 76) in his father.   Allergies Allergies[1]   Home Medications  Prior to Admission medications  Medication Sig Start Date End Date Taking? Authorizing Provider  allopurinol (ZYLOPRIM) 100 MG tablet Take 100 mg by mouth in the morning.   Yes [provider]  amLODipine  (NORVASC ) 5 MG tablet Take 5 mg by mouth in the morning.   Yes [provider]  aspirin  EC 81 MG tablet Take 1 tablet (81 mg total) by mouth daily. Swallow whole. 12/27/20  Yes [name], MD  atorvastatin  (LIPITOR ) 20 MG tablet Take 1 tablet by mouth in the morning. 07/06/20  Yes [provider]  Cholecalciferol 50 MCG (2000 UT) TABS Take 1,000 Units by mouth daily.   Yes [provider]  finasteride (PROSCAR) 5 MG tablet Take 5 mg by mouth daily.   Yes [provider]  fluticasone  (FLONASE ) 50 MCG/ACT nasal spray Place 2 sprays into both nostrils daily. Patient taking differently: Place 2 sprays into both nostrils  daily as needed (Congestion). 03/06/24  Yes Leath-Warren, Etta PARAS, NP  glipiZIDE  (GLUCOTROL ) 5 MG tablet Take 5 mg by mouth in the morning and at bedtime. 01/22/22  Yes [provider]  isosorbide  mononitrate (IMDUR ) 30 MG 24 hr tablet Take 1 tablet (30 mg total) by mouth daily. 09/20/24 09/20/25 Yes End, Lonni, MD  meclizine  (ANTIVERT ) 25 MG tablet Take 1 tablet (25 mg total) by mouth 3 (three) times daily as needed for dizziness. 02/12/22  Yes Ricky Fines, MD  metFORMIN  (GLUCOPHAGE ) 500 MG tablet Take 500 mg by mouth 2 (two) times daily with a meal. 09/16/24  Yes [name], MD  metoprolol  succinate (TOPROL -XL) 50 MG 24 hr tablet Take 1 tablet (50 mg total) by mouth daily. With or immediately following a meal Patient taking differently: Take 50 mg by mouth at bedtime. With or immediately following a meal 07/29/24  Yes [name], MD  nitroGLYCERIN  (NITROSTAT )  0.4 MG SL tablet Place 1 tablet (0.4 mg total) under the tongue every 5 (five) minutes as needed for chest pain. 09/20/24 09/20/25 Yes End, Lonni, MD  Omega-3 Fatty Acids (FISH OIL PO) Take 2,400 mg by mouth in the morning.   Yes [provider]  tamsulosin  (FLOMAX ) 0.4 MG CAPS capsule Take 0.4 mg by mouth at bedtime. 01/29/22  Yes [provider]  vitamin B-12 (CYANOCOBALAMIN ) 1000 MCG tablet Take 1 tablet (1,000 mcg total) by mouth daily. 02/12/22  Yes Ricky Fines, MD  colchicine  0.6 MG tablet Take 0.6 mg by mouth daily as needed (gout flare).    [provider]  [facility] FINEPOINT LANCETS MISC Use to check blood sugar 3 time(s) daily 10/06/17   [provider]     Critical care time: 65 mins     Christian Lanelle Lindo AGACNP-BC   [facility] 10/21/2024, 2:17 PM  Please see [email or web address] for pager details.  From 7A-7P if no response, please call [phone]. After hours, please call ELink [phone].            [1]  Allergies Allergen Reactions   Simvastatin Other (See Comments)    dizziness

## 2024-10-21 NOTE — Anesthesia Procedure Notes (Signed)
 Central Venous Catheter Insertion Performed by: Corinne Garnette BRAVO, MD, anesthesiologist Start/End12/09/2024 6:55 AM, 10/21/2024 7:00 AM Patient location: Pre-op. Preanesthetic checklist: patient identified, IV checked, site marked, risks and benefits discussed, surgical consent, monitors and equipment checked, pre-op evaluation and timeout performed Position: Trendelenburg Hand hygiene performed  and maximum sterile barriers used  Total catheter length 100. PA cath was placed.Swan type:thermodilution PA Cath depth:48 Attempts: 1 Patient tolerated the procedure well with no immediate complications.

## 2024-10-21 NOTE — Anesthesia Procedure Notes (Signed)
 Arterial Line Insertion Start/End12/09/2024 6:45 AM, 10/21/2024 6:49 AM Performed by: Corinne Garnette BRAVO, MD, Elby Raelene SAUNDERS, CRNA, CRNA  Patient location: Pre-op. Preanesthetic checklist: patient identified, IV checked, site marked, risks and benefits discussed, surgical consent, monitors and equipment checked, pre-op evaluation, timeout performed and anesthesia consent Lidocaine  1% used for infiltration Left, radial was placed Catheter size: 20 G  Attempts: 1 Following insertion, dressing applied and Biopatch. Post procedure assessment: normal  Patient tolerated the procedure well with no immediate complications.

## 2024-10-21 NOTE — Progress Notes (Signed)
 TCTS PM Rounding Progress Note  Extubated about 15 min. Ago, doing well Chest tube output minimal, UOP good Off/on a little phenyl   Vitals:   10/21/24 1655 10/21/24 1700  BP:  101/67  Pulse: 81 81  Resp: 13 13  Temp: 98.2 F (36.8 C) 98.2 F (36.8 C)  SpO2: 100% 100%    Plan: - Dangle in about an hour  Con Clunes, MD Cardiothoracic Surgery Pager: [phone]

## 2024-10-22 ENCOUNTER — Inpatient Hospital Stay (HOSPITAL_COMMUNITY)

## 2024-10-22 ENCOUNTER — Encounter (HOSPITAL_COMMUNITY): Payer: Self-pay | Admitting: Surgery

## 2024-10-22 DIAGNOSIS — E871 Hypo-osmolality and hyponatremia: Secondary | ICD-10-CM

## 2024-10-22 DIAGNOSIS — N179 Acute kidney failure, unspecified: Secondary | ICD-10-CM

## 2024-10-22 LAB — GLUCOSE, CAPILLARY
Glucose-Capillary: 122 mg/dL — ABNORMAL HIGH (ref 70–99)
Glucose-Capillary: 136 mg/dL — ABNORMAL HIGH (ref 70–99)
Glucose-Capillary: 137 mg/dL — ABNORMAL HIGH (ref 70–99)
Glucose-Capillary: 146 mg/dL — ABNORMAL HIGH (ref 70–99)
Glucose-Capillary: 153 mg/dL — ABNORMAL HIGH (ref 70–99)
Glucose-Capillary: 155 mg/dL — ABNORMAL HIGH (ref 70–99)
Glucose-Capillary: 167 mg/dL — ABNORMAL HIGH (ref 70–99)
Glucose-Capillary: 175 mg/dL — ABNORMAL HIGH (ref 70–99)
Glucose-Capillary: 189 mg/dL — ABNORMAL HIGH (ref 70–99)

## 2024-10-22 LAB — CBC
HCT: 30 % — ABNORMAL LOW (ref 39.0–52.0)
HCT: 29.1 % — ABNORMAL LOW (ref 39.0–52.0)
Hemoglobin: 9.8 g/dL — ABNORMAL LOW (ref 13.0–17.0)
Hemoglobin: 9.3 g/dL — ABNORMAL LOW (ref 13.0–17.0)
MCH: 29.5 pg (ref 26.0–34.0)
MCH: 29.1 pg (ref 26.0–34.0)
MCHC: 32.7 g/dL (ref 30.0–36.0)
MCHC: 32 g/dL (ref 30.0–36.0)
MCV: 90.4 fL (ref 80.0–100.0)
MCV: 90.9 fL (ref 80.0–100.0)
Platelets: 216 K/uL (ref 150–400)
Platelets: 184 K/uL (ref 150–400)
RBC: 3.32 MIL/uL — ABNORMAL LOW (ref 4.22–5.81)
RBC: 3.2 MIL/uL — ABNORMAL LOW (ref 4.22–5.81)
RDW: 13.2 % (ref 11.5–15.5)
RDW: 13.4 % (ref 11.5–15.5)
WBC: 13.2 K/uL — ABNORMAL HIGH (ref 4.0–10.5)
WBC: 11.9 K/uL — ABNORMAL HIGH (ref 4.0–10.5)
nRBC: 0 % (ref 0.0–0.2)
nRBC: 0 % (ref 0.0–0.2)

## 2024-10-22 LAB — BASIC METABOLIC PANEL WITH GFR
Anion gap: 8 (ref 5–15)
Anion gap: 7 (ref 5–15)
BUN: 17 mg/dL (ref 8–23)
BUN: 19 mg/dL (ref 8–23)
CO2: 22 mmol/L (ref 22–32)
CO2: 23 mmol/L (ref 22–32)
Calcium: 7.3 mg/dL — ABNORMAL LOW (ref 8.9–10.3)
Calcium: 7.6 mg/dL — ABNORMAL LOW (ref 8.9–10.3)
Chloride: 105 mmol/L (ref 98–111)
Chloride: 104 mmol/L (ref 98–111)
Creatinine, Ser: 1.04 mg/dL (ref 0.61–1.24)
Creatinine, Ser: 1.27 mg/dL — ABNORMAL HIGH (ref 0.61–1.24)
GFR, Estimated: >60 mL/min (ref >60)
GFR, Estimated: 58 mL/min — ABNORMAL LOW (ref >60)
Glucose, Bld: 130 mg/dL — ABNORMAL HIGH (ref 70–99)
Glucose, Bld: 166 mg/dL — ABNORMAL HIGH (ref 70–99)
Potassium: 4.1 mmol/L (ref 3.5–5.1)
Potassium: 4.3 mmol/L (ref 3.5–5.1)
Sodium: 135 mmol/L (ref 135–145)
Sodium: 134 mmol/L — ABNORMAL LOW (ref 135–145)

## 2024-10-22 LAB — MAGNESIUM
Magnesium: 2.2 mg/dL (ref 1.7–2.4)
Magnesium: 2 mg/dL (ref 1.7–2.4)

## 2024-10-22 LAB — ECHO INTRAOPERATIVE TEE
Height: 69 in
Weight: 3699.21 [oz_av]

## 2024-10-22 LAB — PHOSPHORUS: Phosphorus: 3.5 mg/dL (ref 2.5–4.6)

## 2024-10-22 MED ORDER — METOPROLOL TARTRATE 5 MG/5ML IV SOLN: 2.5000 mg | Freq: Four times a day (QID) | INTRAVENOUS | Status: DC | PRN

## 2024-10-22 MED ORDER — FUROSEMIDE 10 MG/ML IJ SOLN
40.0000 mg | Freq: Once | INTRAMUSCULAR | Status: AC
  Administered 2024-10-22: 40 mg via INTRAVENOUS
  Filled 2024-10-22: qty 4

## 2024-10-22 MED ORDER — ORAL CARE MOUTH RINSE: 15.0000 mL | OROMUCOSAL | Status: DC | PRN

## 2024-10-22 MED ORDER — LIDOCAINE 5 % EX PTCH
1.0000 | MEDICATED_PATCH | CUTANEOUS | Status: DC
  Administered 2024-10-22 – 2024-10-27 (×6): 1 via TRANSDERMAL
  Filled 2024-10-22 (×7): qty 1

## 2024-10-22 MED ORDER — PHENYLEPHRINE HCL-NACL 20-0.9 MG/250ML-% IV SOLN: 0.0000 ug/min | INTRAVENOUS | Status: DC

## 2024-10-22 MED ORDER — INSULIN ASPART 100 UNIT/ML IJ SOLN
0.0000 [IU] | INTRAMUSCULAR | Status: DC
  Administered 2024-10-22: 2 [IU] via SUBCUTANEOUS
  Administered 2024-10-22: 4 [IU] via SUBCUTANEOUS
  Administered 2024-10-22: 2 [IU] via SUBCUTANEOUS
  Administered 2024-10-22 – 2024-10-23 (×2): 4 [IU] via SUBCUTANEOUS
  Filled 2024-10-22: qty 2
  Filled 2024-10-22: qty 4
  Filled 2024-10-22: qty 2
  Filled 2024-10-22 (×2): qty 4

## 2024-10-22 MED ORDER — METOPROLOL TARTRATE 12.5 MG HALF TABLET: 12.5000 mg | ORAL_TABLET | Freq: Two times a day (BID) | ORAL | Status: DC

## 2024-10-22 MED ORDER — ENOXAPARIN SODIUM 30 MG/0.3ML IJ SOSY: 30.0000 mg | PREFILLED_SYRINGE | INTRAMUSCULAR | Status: DC

## 2024-10-22 MED ORDER — POTASSIUM CHLORIDE CRYS ER 20 MEQ PO TBCR
20.0000 meq | EXTENDED_RELEASE_TABLET | Freq: Once | ORAL | Status: AC
  Administered 2024-10-22: 20 meq via ORAL
  Filled 2024-10-22: qty 1

## 2024-10-22 MED ORDER — ENOXAPARIN SODIUM 40 MG/0.4ML IJ SOSY
40.0000 mg | PREFILLED_SYRINGE | INTRAMUSCULAR | Status: DC
  Administered 2024-10-22 – 2024-10-26 (×5): 40 mg via SUBCUTANEOUS
  Filled 2024-10-22 (×5): qty 0.4

## 2024-10-22 MED ORDER — METOPROLOL TARTRATE 12.5 MG HALF TABLET
12.5000 mg | ORAL_TABLET | Freq: Once | ORAL | Status: AC
  Administered 2024-10-22: 12.5 mg via ORAL
  Filled 2024-10-22: qty 1

## 2024-10-22 MED ORDER — INSULIN GLARGINE 100 UNIT/ML ~~LOC~~ SOLN
10.0000 [IU] | SUBCUTANEOUS | Status: DC
  Administered 2024-10-22 – 2024-10-24 (×3): 10 [IU] via SUBCUTANEOUS
  Filled 2024-10-22 (×3): qty 0.1

## 2024-10-22 MED ORDER — METOPROLOL TARTRATE 25 MG/10 ML ORAL SUSPENSION: 12.5000 mg | Freq: Two times a day (BID) | ORAL | Status: DC

## 2024-10-22 MED ORDER — METHOCARBAMOL 500 MG PO TABS
500.0000 mg | ORAL_TABLET | Freq: Three times a day (TID) | ORAL | Status: DC | PRN
  Administered 2024-10-22 – 2024-10-23 (×3): 500 mg via ORAL
  Filled 2024-10-22 (×3): qty 1

## 2024-10-22 MED FILL — Mannitol IV Soln 20%: INTRAVENOUS | Qty: 500 | Status: AC

## 2024-10-22 MED FILL — Lidocaine HCl Local Soln Prefilled Syringe 100 MG/5ML (2%): INTRAMUSCULAR | Qty: 5 | Status: AC

## 2024-10-22 MED FILL — Thrombin (Recombinant) For Soln 20000 Unit: CUTANEOUS | Qty: 1 | Status: AC

## 2024-10-22 MED FILL — Heparin Sodium (Porcine) Inj 1000 Unit/ML: INTRAMUSCULAR | Qty: 10 | Status: AC

## 2024-10-22 MED FILL — Sodium Chloride IV Soln 0.9%: INTRAVENOUS | Qty: 2000 | Status: AC

## 2024-10-22 MED FILL — Electrolyte-R (PH 7.4) Solution: INTRAVENOUS | Qty: 4000 | Status: AC

## 2024-10-22 NOTE — Consult Note (Signed)
 NAME:  [name] Timberman., MRN:  [id number], DOB:  June 25, 1946, LOS: 1 ADMISSION DATE:  10/21/2024, CONSULTATION DATE:  10/21/24  REFERRING MD:  [name] CHIEF COMPLAINT:  CAD   History of Present Illness:  Pt is a 78 yr old male with significant hx of DM, HLD, HTN, and CAD s/p PTCA (1990s), cardiac cath on 09/20/24 showing severe 3 vessel CAD, who presents for a CABG x 3 by MD Bartle. PCCM Consulted to assist with post-op vent management and critical care while in ICU.   Xclamp: 68 mins  CPB: 88 mins   EBL: 150 cc  Albumin given: 250 cc   Cell saver: 210 cc    Pertinent  Medical History   Past Medical History:  Diagnosis Date   CAD (coronary artery disease)    Cancer (HCC)    Diabetes (HCC)    Dyslipidemia    Gout    History of kidney stones    HTN (hypertension)    Myocardial infarction (HCC)      Significant Hospital Events: Including procedures, antibiotic start and stop dates in addition to other pertinent events   12/11 CABG x 3  Interim History / Subjective:  Ambulated to door this morning. No specific complaints. No acute events overnight. Off phenyl this morning.   Objective    Blood pressure 126/72, pulse 90, temperature (!) 100.4 F (38 C), resp. rate 19, height 5' 9 (1.753 m), weight 115.2 kg, SpO2 95%. PAP: (18-40)/(12-30) 31/19 CVP:  [5 mmHg-19 mmHg] 16 mmHg  Vent Mode: PSV;CPAP FiO2 (%):  [40 %] 40 % Set Rate:  [4 bmp] 4 bmp PEEP:  [5 cmH20] 5 cmH20 Pressure Support:  [10 cmH20] 10 cmH20   Intake/Output Summary (Last 24 hours) at 10/22/2024 1407 Last data filed at 10/22/2024 1300 Gross per 24 hour  Intake 3358.79 ml  Output 1566 ml  Net 1792.79 ml   Filed Weights   10/21/24 0555 10/22/24 0424  Weight: 104.9 kg 115.2 kg    Examination: General: acute on chronically ill appearing male, resting in bed HEENT: [place]/AT, sclera anicteric CV: regular rate and rhythm, normal S1 and S2, no m/r/g Pulm: breathing comfortably on room air,  clear/diminished Extremities: warm, dry, mild edema  Skin: no rashes or lesions Neuro: alert and responsive, right sided weakness at baseline GU: foley in place draining clear, yellow urine  Resolved problem list  Endotracheally intubated  Assessment and Plan  Severe 3 vessel CAD with NSTEMI s/p CABGx3 (LIMA-LAD, VG-OM2, VG-OM3) - Postoperative management per TCTS - Remove wires and chest tubes today - Complete post-op antibiotics - Continue ASA and statin - Start BB now off pressors - Ensure adequate pain control-tramadol, oxycodone , and morphine   S/p cervical spine surgery Residual right sided weakness and hoarseness. - Continue PT/OT  Hypertension - Holding home imdur  and norvasc  - just came off pressors this morning, can resume as clinically indicated  Hyperlipidemia - Continue statin  Diabetes type 2 - Holding home metformin  - Transition from insulin  gtt to SSI + glargine - CBG goal 140-180  Expected acute blood loss anemia - Continue to monitor daily CBC  History of gout - Continue home allopurinol   Labs   CBC: Recent Labs  Lab 10/19/24 1220 10/21/24 0826 10/21/24 1137 10/21/24 1153 10/21/24 1340 10/21/24 1342 10/21/24 1703 10/21/24 1856 10/21/24 2004 10/22/24 0547  WBC 9.0  --   --   --  10.4  --   --   --  12.8* 13.2*  HGB 12.8*   < > 8.5*   < > 9.3* 9.9* 9.9* 9.5* 9.6* 9.8*  HCT 39.9   < > 25.7*   < > 28.7* 29.0* 29.0* 28.0* 29.7* 30.0*  MCV 89.1  --   --   --  89.4  --   --   --  90.5 90.4  PLT 266  --  190  --  161  --   --   --  204 216   < > = values in this interval not displayed.    Basic Metabolic Panel: Recent Labs  Lab 10/19/24 1220 10/21/24 0826 10/21/24 1103 10/21/24 1153 10/21/24 1213 10/21/24 1216 10/21/24 1342 10/21/24 1703 10/21/24 1856 10/21/24 2004 10/22/24 0547  NA 138   < > 141 140   < > 141 141 140 139 138 135  K 4.3   < > 4.2 4.7   < > 4.3 4.0 4.4 4.3 4.2 4.1  CL 103   < > 102 102  --  102  --   --   --  107  105  CO2 25  --   --   --   --   --   --   --   --  23 22  GLUCOSE 133*   < > 139* 163*  --  161*  --   --   --  118* 130*  BUN 12   < > 17 16  --  17  --   --   --  15 17  CREATININE 0.79   < > 0.80 0.80  --  0.80  --   --   --  0.89 1.04  CALCIUM  8.8*  --   --   --   --   --   --   --   --  7.7* 7.3*  MG  --   --   --   --   --   --   --   --   --  2.5* 2.2  PHOS  --   --   --   --   --   --   --   --   --   --  3.5   < > = values in this interval not displayed.   GFR: Estimated Creatinine Clearance: 74.5 mL/min (by C-G formula based on SCr of 1.04 mg/dL). Recent Labs  Lab 10/19/24 1220 10/21/24 1340 10/21/24 2004 10/22/24 0547  WBC 9.0 10.4 12.8* 13.2*    Liver Function Tests: Recent Labs  Lab 10/19/24 1220  AST 20  ALT 16  ALKPHOS 61  BILITOT 0.8  PROT 7.1  ALBUMIN 3.9   No results for input(s): LIPASE, AMYLASE in the last 168 hours. No results for input(s): AMMONIA in the last 168 hours.  ABG    Component Value Date/Time   PHART 7.346 (L) 10/21/2024 1856   PCO2ART 42.6 10/21/2024 1856   PO2ART 109 (H) 10/21/2024 1856   HCO3 23.2 10/21/2024 1856   TCO2 24 10/21/2024 1856   ACIDBASEDEF 2.0 10/21/2024 1856   O2SAT 98 10/21/2024 1856     Coagulation Profile: Recent Labs  Lab 10/19/24 1220 10/21/24 1340  INR 1.1 1.3*    Cardiac Enzymes: No results for input(s): CKTOTAL, CKMB, CKMBINDEX, TROPONINI in the last 168 hours.  HbA1C: Hgb A1c MFr Bld  Date/Time Value Ref Range Status  10/19/2024 12:00 PM 8.5 (H) 4.8 - 5.6 % Final    Comment:    (NOTE) Diagnosis  of Diabetes The following HbA1c ranges recommended by the American Diabetes Association (ADA) may be used as an aid in the diagnosis of diabetes mellitus.  Hemoglobin             Suggested A1C NGSP%              Diagnosis  <5.7                   Non Diabetic  5.7-6.4                Pre-Diabetic  >6.4                   Diabetic  <7.0                   Glycemic control for                        adults with diabetes.    02/11/2022 05:50 AM 7.0 (H) 4.8 - 5.6 % Final    Comment:    (NOTE)         Prediabetes: 5.7 - 6.4         Diabetes: >6.4         Glycemic control for adults with diabetes: <7.0     CBG: Recent Labs  Lab 10/22/24 0232 10/22/24 0547 10/22/24 0810 10/22/24 1023 10/22/24 1155  GLUCAP 136* 137* 146* 175* 153*      Rexene LOISE Blush, [place] [facility] 10/22/2024 2:49 PM  Please see [email or web address] for pager details.  From 7A-7P if no response, please call [phone] After hours, please call ELink [phone]

## 2024-10-22 NOTE — Evaluation (Signed)
 Physical Therapy Evaluation Patient Details Name: [name] [name]. MRN: [id number] DOB: 1945-11-13 Today's Date: 10/22/2024  History of Present Illness  Pt is a 78 y.o male admitted 12/11 for scheduled CABG x3. PMH: CAD, DM, HTN, MI, cancer, gout  Clinical Impression  Pt presents with condition above and deficits mentioned below, see PT Problem List. PTA, he was mod I using a SPC for functional mobility, living alone in a 1-level apartment with an elevator access. He has baseline numbness and weakness throughout his R leg after a spinal surgery several years ago, which was noted this date. He is hypersensitive to touch in his bil feet as well. He currently displays deficits in generalized strength, balance, power, and activity tolerance. He is limited by pain, but despite the pain he is motivated to participate and improve. He is currently requiring frequent cuing to maintain his sternal precautions along with modAx2 for bed mobility, minAx2 for transfers, and min-modA to ambulate up to ~12 ft with a RW (+2 for safety/line management). As the pt has had a drastic functional decline, is motivated to improve/participate, and has good social support, he could greatly benefit from intensive inpatient rehab, > 3 hours/day. Will continue to follow acutely.        If plan is discharge home, recommend the following: Two people to help with walking and/or transfers;Two people to help with bathing/dressing/bathroom;Assistance with cooking/housework;Assist for transportation   Can travel by private vehicle        Equipment Recommendations BSC/3in1;Hospital bed;Other (comment) (tub bench/shower chair)  Recommendations for Other Services  Rehab consult    Functional Status Assessment Patient has had a recent decline in their functional status and demonstrates the ability to make significant improvements in function in a reasonable and predictable amount of time.     Precautions / Restrictions  Precautions Precautions: Fall;Sternal Precaution Booklet Issued: No Precaution/Restrictions Comments: reviewed precautions, watch vitals, temp pacer, Y-chest tube Restrictions Weight Bearing Restrictions Per Provider Order: Yes Other Position/Activity Restrictions: sternal precautions      Mobility  Bed Mobility Overal bed mobility: Needs Assistance Bed Mobility: Rolling, Sidelying to Sit, Sit to Sidelying Rolling: Mod assist, +2 for physical assistance, +2 for safety/equipment Sidelying to sit: Mod assist, +2 for safety/equipment, +2 for physical assistance, HOB elevated     Sit to sidelying: Mod assist, +2 for physical assistance, +2 for safety/equipment General bed mobility comments: Cues provided for pt to flex L leg and rotate trunk to roll to R, needing modAx2 to rotate body as pt instead scooting his trunk laterally. ModAx2 needed to ascend trunk to sit up R EOB with pt hugging his heart pillow. Cues provided for pt to lean laterally to R to return to sidelying from sitting, needing modAx2 to direct trunk, manage lines, and lift legs back onto bed.    Transfers Overall transfer level: Needs assistance Equipment used: Rolling walker (2 wheels) Transfers: Sit to/from Stand Sit to Stand: Min assist, +2 physical assistance, +2 safety/equipment           General transfer comment: Cues provided for pt to hug his pillow when standing from EOB, minAx2 needed to power up to stand and gain balance.    Ambulation/Gait Ambulation/Gait assistance: Min assist, Mod assist, +2 safety/equipment Gait Distance (Feet): 12 Feet Assistive device: Rolling walker (2 wheels) Gait Pattern/deviations: Step-to pattern, Decreased step length - right, Decreased step length - left, Decreased stride length, Shuffle, Decreased weight shift to right, Decreased weight shift to left Gait velocity: reduced  Gait velocity interpretation: <1.31 ft/sec, indicative of household ambulator   General Gait  Details: Pt initially ambulated with minimal foot advancement, shuffling his feet slowly and unsteadily. Tactile cues provided for weight shifting bil and verbal cues provided to take longer steps with fair success, but continued shuffling noted. MinA for balance the majority of the time, but modA when pt would lean posteriorly when coughing and bracing chest with x1 UE. +2 for safety and line management.  Stairs            Wheelchair Mobility     Tilt Bed    Modified Rankin (Stroke Patients Only)       Balance Overall balance assessment: Needs assistance Sitting-balance support: No upper extremity supported, Feet supported Sitting balance-Leahy Scale: Fair Sitting balance - Comments: Static sitting EOB with CGA-minA for balance Postural control: Posterior lean Standing balance support: Bilateral upper extremity supported, Single extremity supported, During functional activity Standing balance-Leahy Scale: Poor Standing balance comment: Reliant on RW and min-modA to ambulate. Posterior lean noted when only having x1 UE support on RW while bracing chest with other UE when coughing, modA required for balance.                             Pertinent Vitals/Pain Pain Assessment Pain Assessment: Faces Faces Pain Scale: Hurts even more Pain Location: sternum Pain Descriptors / Indicators: Discomfort, Grimacing, Guarding, Operative site guarding, Sore Pain Intervention(s): Limited activity within patient's tolerance, Monitored during session, Repositioned, RN gave pain meds during session    Home Living Family/patient expects to be discharged to:: Private residence Living Arrangements: Alone Available Help at Discharge: Family;Available 24 hours/day (grandchildren and children can arrange it per pt) Type of Home: Apartment Home Access: Elevator       Home Layout: One level Home Equipment: Grab bars - tub/shower;Cane - single Librarian, Academic (2 wheels);Other  (comment) (w/c available at his complex per pt)      Prior Function Prior Level of Function : Independent/Modified Independent             Mobility Comments: Uses SPC at all times, x1 fall in past 6 months, which occurred when pt was going to the bathroom       Extremity/Trunk Assessment   Upper Extremity Assessment Upper Extremity Assessment: Defer to OT evaluation    Lower Extremity Assessment Lower Extremity Assessment: Generalized weakness;RLE deficits/detail;LLE deficits/detail RLE Deficits / Details: hx of R leg weakness since spinal surgery a few years ago, gross MMT scores of 3 to 3+; hypersensitivity to touch at bil feet, but pt unable to detect touch throughout R leg (hx of numbness since spinal surgery a few years ago) RLE Sensation: decreased light touch LLE Deficits / Details: hypersensitivity to touch at L foot, otherwise sensation intact throughout; grossly 4 to 4+ with MMT    Cervical / Trunk Assessment Cervical / Trunk Assessment: Other exceptions Cervical / Trunk Exceptions: sternal precautions  Communication   Communication Communication: Impaired Factors Affecting Communication: Other (comment) (soft spoken)    Cognition Arousal: Alert Behavior During Therapy: WFL for tasks assessed/performed   PT - Cognitive impairments: Problem solving, Safety/Judgement                       PT - Cognition Comments: Slow processing at times, likely due to being distracted by pain. Needs cues to be aware when to turn around when ambulating to return to bed to  ensure his safety Following commands: Impaired Following commands impaired: Follows multi-step commands with increased time     Cueing Cueing Techniques: Verbal cues, Tactile cues     General Comments General comments (skin integrity, edema, etc.): VSS on 3L O2 via [place]    Exercises     Assessment/Plan    PT Assessment Patient needs continued PT services  PT Problem List Decreased  strength;Decreased activity tolerance;Decreased balance;Decreased mobility;Decreased knowledge of precautions;Cardiopulmonary status limiting activity;Pain       PT Treatment Interventions DME instruction;Gait training;Functional mobility training;Therapeutic activities;Therapeutic exercise;Balance training;Neuromuscular re-education;Patient/family education    PT Goals (Current goals can be found in the Care Plan section)  Acute Rehab PT Goals Patient Stated Goal: to improve PT Goal Formulation: With patient Time For Goal Achievement: 11/05/24 Potential to Achieve Goals: Good    Frequency Min 3X/week     Co-evaluation   Reason for Co-Treatment: For patient/therapist safety;To address functional/ADL transfers PT goals addressed during session: Mobility/safety with mobility;Balance;Proper use of DME OT goals addressed during session: ADL's and self-care       AM-PAC PT 6 Clicks Mobility  Outcome Measure Help needed turning from your back to your side while in a flat bed without using bedrails?: Total Help needed moving from lying on your back to sitting on the side of a flat bed without using bedrails?: Total Help needed moving to and from a bed to a chair (including a wheelchair)?: Total Help needed standing up from a chair using your arms (e.g., wheelchair or bedside chair)?: Total Help needed to walk in hospital room?: Total (<20 ft) Help needed climbing 3-5 steps with a railing? : Total 6 Click Score: 6    End of Session Equipment Utilized During Treatment: Gait belt;Oxygen Activity Tolerance: Patient tolerated treatment well;Patient limited by pain Patient left: in bed;with call bell/phone within reach;with nursing/sitter in room Nurse Communication: Mobility status;Other (comment) (RN present throughout session) PT Visit Diagnosis: Unsteadiness on feet (R26.81);Other abnormalities of gait and mobility (R26.89);Muscle weakness (generalized) (M62.81);Difficulty in  walking, not elsewhere classified (R26.2);Pain Pain - Right/Left:  (sternum) Pain - part of body:  (sternum)    Time: 9160-9084 PT Time Calculation (min) (ACUTE ONLY): 36 min   Charges:   PT Evaluation $PT Eval Moderate Complexity: 1 Mod   PT General Charges $$ ACUTE PT VISIT: 1 Visit         Theo Ferretti, PT, DPT Acute Rehabilitation Services  Office: [phone]   Theo CHRISTELLA Ferretti 10/22/2024, 9:35 AM

## 2024-10-22 NOTE — Anesthesia Postprocedure Evaluation (Signed)
 Anesthesia Post Note  Patient: [name] [name].  Procedure(s) Performed: CORONARY ARTERY BYPASS GRAFTING, TIMES 3 USING LEFT INTERNAL MAMMARY ARTERY AND ENDOSCOPICALLY HARVESTED RIGHT SAPHENOUS VEIN (Chest) ECHOCARDIOGRAM, TRANSESOPHAGEAL, INTRAOPERATIVE     Patient location during evaluation: SICU Anesthesia Type: General Level of consciousness: sedated Pain management: pain level controlled Vital Signs Assessment: post-procedure vital signs reviewed and stable Respiratory status: patient remains intubated per anesthesia plan Cardiovascular status: stable Postop Assessment: no apparent nausea or vomiting Anesthetic complications: no   No notable events documented.  Last Vitals:    Last Pain:                 [name] [name]

## 2024-10-22 NOTE — H&P (Signed)
  Inpatient Rehab Admissions Coordinator :  Per therapy recommendations, patient was screened for CIR candidacy by Ottie Glazier RN MSN.  At this time patient appears to be a potential candidate for CIR. I will place a rehab consult per protocol for full assessment. Please call me with any questions.  Ottie Glazier RN MSN Admissions Coordinator [phone]

## 2024-10-22 NOTE — Evaluation (Signed)
 Occupational Therapy Evaluation Patient Details Name: [name] [name]. MRN: [id number] DOB: 01/19/1946 Today's Date: 10/22/2024   History of Present Illness   Pt is a 78 y.o male admitted 12/11 for scheduled CABG x3. PMH: CAD, DM, HTN, MI, cancer, gout     Clinical Impressions Pt admitted based on above, and was seen based on problem list below. PTA pt was independent with ADLs and IADLs. Today pt is requiring set up  to mod assist for ADLs. Bed mobility was mod +2 and functional transfers are  min  +2 with RW. Pt limited by sternal pain and decreased activity tolerance. Anticipate pt would progress well and can reach mod I with intensive post-acute rehab. Recommending  >3 hours of skilled rehab daily. OT will continue to follow acutely to maximize functional independence.        If plan is discharge home, recommend the following:   A lot of help with walking and/or transfers;A lot of help with bathing/dressing/bathroom;Assistance with cooking/housework;Assist for transportation     Functional Status Assessment   Patient has had a recent decline in their functional status and demonstrates the ability to make significant improvements in function in a reasonable and predictable amount of time.     Equipment Recommendations   Other (comment) (Defer to next venue)     Recommendations for Other Services   Rehab consult     Precautions/Restrictions   Precautions Precautions: Fall;Sternal Precaution Booklet Issued: No Precaution/Restrictions Comments: reviewed precautions, watch vitals, temp pacer, Y-chest tube Restrictions Weight Bearing Restrictions Per Provider Order: Yes Other Position/Activity Restrictions: sternal precautions     Mobility Bed Mobility Overal bed mobility: Needs Assistance Bed Mobility: Rolling, Sidelying to Sit, Sit to Sidelying Rolling: Mod assist, +2 for physical assistance, +2 for safety/equipment Sidelying to sit: Mod assist, +2 for  safety/equipment, +2 for physical assistance, HOB elevated     Sit to sidelying: Mod assist, +2 for physical assistance, +2 for safety/equipment General bed mobility comments: Cues for sequencing, mod assist +2 to roll to side, and scoot to EOB. mod +2 to return to supine to manage BLEs and trunk    Transfers Overall transfer level: Needs assistance Equipment used: Rolling walker (2 wheels) Transfers: Sit to/from Stand Sit to Stand: Min assist, +2 physical assistance, +2 safety/equipment           General transfer comment: Cues to rock for momentum min +2 for boost      Balance Overall balance assessment: Needs assistance Sitting-balance support: No upper extremity supported, Feet supported Sitting balance-Leahy Scale: Fair   Postural control: Posterior lean Standing balance support: Bilateral upper extremity supported, Single extremity supported, During functional activity Standing balance-Leahy Scale: Poor Standing balance comment: Reliant on RW       ADL either performed or assessed with clinical judgement   ADL Overall ADL's : Needs assistance/impaired Eating/Feeding: Set up;Sitting   Grooming: Set up;Sitting   Upper Body Bathing: Minimal assistance;Sitting   Lower Body Bathing: Moderate assistance;Sit to/from stand   Upper Body Dressing : Minimal assistance;Sitting   Lower Body Dressing: Moderate assistance;Sit to/from stand   Toilet Transfer: +2 for safety/equipment;Rolling walker (2 wheels);Minimal assistance           Functional mobility during ADLs: Moderate assistance;Rolling walker (2 wheels) General ADL Comments: Limited by decreased activity toleramce     Vision Baseline Vision/History: 1 Wears glasses Vision Assessment?: No apparent visual deficits            Pertinent Vitals/Pain Pain Assessment Pain  Assessment: Faces Faces Pain Scale: Hurts even more Pain Location: sternum Pain Descriptors / Indicators: Discomfort, Grimacing,  Guarding, Operative site guarding, Sore Pain Intervention(s): Limited activity within patient's tolerance     Extremity/Trunk Assessment Upper Extremity Assessment Upper Extremity Assessment: Generalized weakness;RUE deficits/detail RUE Deficits / Details: Hx of R sided weakness, decreased grip strength and light touch RUE Sensation: decreased light touch   Lower Extremity Assessment Lower Extremity Assessment: Defer to PT evaluation RLE Deficits / Details: hx of R leg weakness since spinal surgery a few years ago, gross MMT scores of 3 to 3+; hypersensitivity to touch at bil feet, but pt unable to detect touch throughout R leg (hx of numbness since spinal surgery a few years ago) RLE Sensation: decreased light touch LLE Deficits / Details: hypersensitivity to touch at L foot, otherwise sensation intact throughout; grossly 4 to 4+ with MMT   Cervical / Trunk Assessment Cervical / Trunk Assessment: Other exceptions Cervical / Trunk Exceptions: sternal precautions   Communication Communication Communication: No apparent difficulties Factors Affecting Communication: Other (comment) (soft spoken)   Cognition Arousal: Alert Behavior During Therapy: WFL for tasks assessed/performed Cognition: No apparent impairments             OT - Cognition Comments: Mild delayed processing                 Following commands: Impaired Following commands impaired: Follows multi-step commands with increased time     Cueing  General Comments   Cueing Techniques: Verbal cues;Tactile cues  VSS on 3L           Home Living Family/patient expects to be discharged to:: Private residence Living Arrangements: Alone Available Help at Discharge: Family;Available 24 hours/day (grandchildren and children can arrange it per pt) Type of Home: Apartment Home Access: Elevator     Home Layout: One level     Bathroom Shower/Tub: Chief Strategy Officer: Standard     Home  Equipment: Grab bars - tub/shower;Cane - single Librarian, Academic (2 wheels);Other (comment) (w/c available at his complex per pt)          Prior Functioning/Environment Prior Level of Function : Independent/Modified Independent             Mobility Comments: Uses SPC at all times, x1 fall in past 6 months, which occurred when pt was going to the bathroom ADLs Comments: Ind    OT Problem List: Decreased strength;Impaired balance (sitting and/or standing);Decreased activity tolerance;Decreased safety awareness;Decreased knowledge of use of DME or AE;Cardiopulmonary status limiting activity;Decreased knowledge of precautions   OT Treatment/Interventions: Self-care/ADL training;Therapeutic exercise;Energy conservation;DME and/or AE instruction;Therapeutic activities;Patient/family education;Balance training      OT Goals(Current goals can be found in the care plan section)   Acute Rehab OT Goals Patient Stated Goal: To get better OT Goal Formulation: With patient Time For Goal Achievement: 11/05/24 Potential to Achieve Goals: Good   OT Frequency:  Min 2X/week    Co-evaluation PT/OT/SLP Co-Evaluation/Treatment: Yes Reason for Co-Treatment: For patient/therapist safety;To address functional/ADL transfers PT goals addressed during session: Mobility/safety with mobility;Balance;Proper use of DME OT goals addressed during session: ADL's and self-care      AM-PAC OT 6 Clicks Daily Activity     Outcome Measure Help from another person eating meals?: None Help from another person taking care of personal grooming?: A Little Help from another person toileting, which includes using toliet, bedpan, or urinal?: Total Help from another person bathing (including washing, rinsing, drying)?: A Lot Help from  another person to put on and taking off regular upper body clothing?: A Little Help from another person to put on and taking off regular lower body clothing?: A Lot 6 Click  Score: 15   End of Session Equipment Utilized During Treatment: Gait belt;Rolling walker (2 wheels);Oxygen (3L) Nurse Communication: Mobility status  Activity Tolerance: Patient limited by fatigue Patient left: in bed;with call bell/phone within reach;with nursing/sitter in room  OT Visit Diagnosis: Unsteadiness on feet (R26.81);Other abnormalities of gait and mobility (R26.89);Muscle weakness (generalized) (M62.81)                Time: 9161-9085 OT Time Calculation (min): 36 min Charges:  OT General Charges $OT Visit: 1 Visit OT Evaluation $OT Eval Moderate Complexity: 1 Mod  Adrianne BROCKS, OT  Acute Rehabilitation Services Office [phone] Secure chat preferred   Adrianne GORMAN Savers 10/22/2024, 10:17 AM

## 2024-10-22 NOTE — Progress Notes (Addendum)
 [facility] DAILY ICU PROGRESS NOTE                   [address]            [place] [address]          [phone]   1 Day Post-Op Procedures (LRB): CORONARY ARTERY BYPASS GRAFTING, TIMES 3 USING LEFT INTERNAL MAMMARY ARTERY AND ENDOSCOPICALLY HARVESTED RIGHT SAPHENOUS VEIN (N/A) ECHOCARDIOGRAM, TRANSESOPHAGEAL, INTRAOPERATIVE (N/A)  Total Length of Stay:  LOS: 1 day   Subjective: Patient with sternal/incisional pain this am.  Objective: Vital signs in last 24 hours: Temp:  [97.5 F (36.4 C)-100.8 F (38.2 C)] 100.4 F (38 C) (12/12 0600) Pulse Rate:  [67-96] 87 (12/12 0600) Cardiac Rhythm: Normal sinus rhythm (12/11 2046) Resp:  [9-27] 17 (12/12 0600) BP: (83-116)/(50-73) 113/63 (12/12 0600) SpO2:  [95 %-100 %] 96 % (12/12 0600) Arterial Line BP: (86-143)/(47-66) 138/62 (12/12 0600) FiO2 (%):  [40 %-50 %] 40 % (12/11 1625) Weight:  [115.2 kg] 115.2 kg (12/12 0424)  Filed Weights   10/21/24 0555 10/22/24 0424  Weight: 104.9 kg 115.2 kg    Weight change: 10.3 kg   Hemodynamic parameters for last 24 hours: PAP: (14-42)/(6-30) 39/26 CVP:  [5 mmHg-20 mmHg] 16 mmHg  Intake/Output from previous day: 12/11 0701 - 12/12 0700 In: 6973.7 [I.V.:4501; Blood:210; NG/GT:50; IV Piggyback:2212.8] Out: 2370 [Urine:1350; Blood:450; Chest Tube:570]  Intake/Output this shift: No intake/output data recorded.  Current Meds: Scheduled Meds:  acetaminophen   1,000 mg Oral Q6H   allopurinol  100 mg Oral q AM   aspirin  EC  325 mg Oral Daily   atorvastatin   40 mg Oral q AM   bisacodyl   10 mg Oral Daily   Or   bisacodyl   10 mg Rectal Daily   Chlorhexidine  Gluconate Cloth  6 each Topical Daily   Chlorhexidine  Gluconate Cloth  6 each Topical Daily   docusate sodium   200 mg Oral Daily   finasteride  5 mg Oral Daily   metoCLOPramide (REGLAN) injection  10 mg Intravenous Q6H   metoprolol  tartrate  12.5 mg Oral BID   Or   metoprolol  tartrate  12.5 mg Per Tube BID   mouth  rinse  15 mL Mouth Rinse Q2H   [START ON 10/23/2024] pantoprazole   40 mg Oral Daily   pantoprazole  (PROTONIX ) IV  40 mg Intravenous QHS   sodium chloride  flush  3 mL Intravenous Q12H   tamsulosin   0.4 mg Oral QHS   Continuous Infusions:  sodium chloride  20 mL/hr at 10/22/24 0700   sodium chloride  10 mL/hr at 10/22/24 0700   albumin human Stopped (10/21/24 2116)    ceFAZolin  (ANCEF ) IV Stopped (10/22/24 0323)   dexmedetomidine  (PRECEDEX ) IV infusion Stopped (10/21/24 1704)   insulin  1.8 Units/hr (10/22/24 0700)   lactated ringers      lactated ringers  20 mL/hr at 10/22/24 0700   nitroGLYCERIN      phenylephrine  (NEO-SYNEPHRINE) Adult infusion 20 mcg/min (10/22/24 0700)   PRN Meds:.sodium chloride , albumin human, dextrose , metoprolol  tartrate, midazolam  PF, morphine injection, ondansetron  (ZOFRAN ) IV, mouth rinse, oxyCODONE , sodium chloride  flush, traMADol  General appearance: cooperative and no distress Neurologic: intact Heart: RRR Lungs: Diminished bibasilar breath sounds Abdomen: Soft, non tender, occasional bowel sounds Extremities: SCDs in place Wound: Sternal dressing dry/intact, RLE wound is clean and dry  Lab Results: CBC: Recent Labs    10/21/24 2004 10/22/24 0547  WBC 12.8* 13.2*  HGB 9.6* 9.8*  HCT 29.7* 30.0*  PLT 204  216   BMET:  Recent Labs    10/21/24 2004 10/22/24 0547  NA 138 135  K 4.2 4.1  CL 107 105  CO2 23 22  GLUCOSE 118* 130*  BUN 15 17  CREATININE 0.89 1.04  CALCIUM  7.7* 7.3*    CMET: Lab Results  Component Value Date   WBC 13.2 (H) 10/22/2024   HGB 9.8 (L) 10/22/2024   HCT 30.0 (L) 10/22/2024   PLT 216 10/22/2024   GLUCOSE 130 (H) 10/22/2024   CHOL 106 02/11/2022   TRIG 157 (H) 02/11/2022   HDL 31 (L) 02/11/2022   LDLCALC 44 02/11/2022   ALT 16 10/19/2024   AST 20 10/19/2024   NA 135 10/22/2024   K 4.1 10/22/2024   CL 105 10/22/2024   CREATININE 1.04 10/22/2024   BUN 17 10/22/2024   CO2 22 10/22/2024   TSH 1.972 02/11/2022    INR 1.3 (H) 10/21/2024   HGBA1C 8.5 (H) 10/19/2024      PT/INR:  Recent Labs    10/21/24 1340  LABPROT 17.2*  INR 1.3*   Radiology: DG Chest Port 1 View Result Date: 10/22/2024 EXAM: 1 VIEW(S) XRAY OF THE CHEST 10/22/2024 05:47:00 AM COMPARISON: Portable chest yesterday at 1:50 pm. CLINICAL HISTORY: [id number] S/P CABG x 3 [id number] FINDINGS: LINES, TUBES AND DEVICES: Interval extubation, NGT removal. Left IJ Swan-Ganz line again terminates in the pulmonary outflow tract. Stable insertion of pericardial and mediastinal drains and left chest tube. LUNGS AND PLEURA: There are atelectatic bands again noted at both bases without focal consolidation. Small left pleural effusion. No measurable pneumothorax. Overall aeration seems unchanged. No new abnormality. HEART AND MEDIASTINUM: Stable appearance of CABG changes, with midline sternotomy sutures. Mild cardiomegaly. No vascular congestion is seen. The mediastinum is stable. Aortic atherosclerosis. BONES AND SOFT TISSUES: Midline sternotomy sutures. No acute osseous abnormality. IMPRESSION: 1. Stable postoperative appearance with midline sternotomy changes and mild cardiomegaly. 2. Atelectatic bands at both bases without focal consolidation. 3. Small left pleural effusion. Electronically signed by: Francis Quam MD 10/22/2024 06:27 AM EST RP Workstation: [id number]   DG Chest Port 1 View Result Date: 10/21/2024 CLINICAL DATA:  Status post CABG. EXAM: PORTABLE CHEST 1 VIEW COMPARISON:  Chest radiograph dated 10/19/2024. FINDINGS: Endotracheal tube approximately 5 cm above the carina. Enteric tube extends below the diaphragm with tip beyond the inferior margin of the image. Right IJ Swan-Ganz catheter with tip in the right main pulmonary artery. Left-sided chest tube and inferior accessed mediastinal drain. There is shallow inspiration. Left lung base atelectasis. No pleural effusion or pneumothorax. The cardiac silhouette is within normal limits. Median  sternotomy wires. No acute osseous pathology. IMPRESSION: 1. Support devices as above. 2. Left lung base atelectasis. No pneumothorax. Electronically Signed   By: Vanetta Chou M.D.   On: 10/21/2024 14:56     Assessment/Plan: S/P Procedures (LRB): CORONARY ARTERY BYPASS GRAFTING, TIMES 3 USING LEFT INTERNAL MAMMARY ARTERY AND ENDOSCOPICALLY HARVESTED RIGHT SAPHENOUS VEIN (N/A) ECHOCARDIOGRAM, TRANSESOPHAGEAL, INTRAOPERATIVE (N/A) CV-SR. On back up pacer at 60. On Neo Synephrine drip  and Lopressor  12.5 mg bid. Wean Neo this am. Will hold Lopressor  for now and monitor BP/HR. Pulmonary-on 3L of oxygen via [place]. Wean as able. Chest tubes with 570 cc since surgery. There is 600 cc in Pleura Vac this am so output diminishing. CXR this am shows cardiomegaly, bibasilar atelectasis, and small left pleural effusion. Encourage incentive spirometer. Remove EPW then chest tubes later this am Remove Swan and a line  Above pre  op weight, requires diuresis-once off Neo Synephrine, will diurese this am Expected post op blood loss anemia-H and H this am stable at 9.8 and 30. DM-CBGs 122/136/137. Transition off the Insulin  drip (per pharmacy). Pre op HGA1C 8.5. He was on Glipizide  5 mg bid and Metformin  500 mg bid prior to surgery. Will restart later in hospital course and when tolerating po well. He will need close follow up with his medical doctor after discharge Fever to 100.6 earlier this am. Likely secondary to atelectasis/SIRS. WBC this am 13,200. Monitor 9. Regarding pain control, Oxy and Ultram ordered PRN as well as Morphine IV PRN. Will add Lidocaine  patch and Robaxin PRN. 10. Start Lovenox  for DVT prophylaxis 11. Deconditioned-right side number/weaker secondary to complications from previous spinal surgery. Will have PT/OT evaluate   Donielle CHRISTELLA Donald PA-C 10/22/2024 7:02 AM   Chart reviewed, patient examined, agree with above.  He has been hemodynamically stable in NSR postop.  ECG this am  shows unchanged bifascicular block that was present preop. He was on Toprol  50 at home preop but will hold off on BB today and consider resuming slowly starting tomorrow if rhythm stable. Remove pacing wires and chest tubes today. Start diruresis.

## 2024-10-22 NOTE — Progress Notes (Signed)
° °  [address] [name] [address]             [phone]   POD # 1 CABG x 3  BP 104/73   Pulse (!) 151   Temp (!) 100.6 F (38.1 C)   Resp (!) 22   Ht 5' 9 (1.753 m)   Wt 115.2 kg   SpO2 96%   BMI 37.50 kg/m    Intake/Output Summary (Last 24 hours) at 10/22/2024 1906 Last data filed at 10/22/2024 1804 Gross per 24 hour  Intake 2349.58 ml  Output 1205 ml  Net 1144.58 ml   Creatinine 1.27 (up from 1.04) K 4.3 Hgb 9.3  Stable POD # 1  Jianna Drabik C. Kerrin, MD [facility] [phone]

## 2024-10-23 ENCOUNTER — Inpatient Hospital Stay (HOSPITAL_COMMUNITY)

## 2024-10-23 LAB — BASIC METABOLIC PANEL WITH GFR
Anion gap: 9 (ref 5–15)
BUN: 21 mg/dL (ref 8–23)
CO2: 23 mmol/L (ref 22–32)
Calcium: 7.5 mg/dL — ABNORMAL LOW (ref 8.9–10.3)
Chloride: 102 mmol/L (ref 98–111)
Creatinine, Ser: 1.21 mg/dL (ref 0.61–1.24)
GFR, Estimated: >60 mL/min (ref >60)
Glucose, Bld: 151 mg/dL — ABNORMAL HIGH (ref 70–99)
Potassium: 4.1 mmol/L (ref 3.5–5.1)
Sodium: 134 mmol/L — ABNORMAL LOW (ref 135–145)

## 2024-10-23 LAB — MAGNESIUM: Magnesium: 2.1 mg/dL (ref 1.7–2.4)

## 2024-10-23 LAB — CBC
HCT: 26.9 % — ABNORMAL LOW (ref 39.0–52.0)
Hemoglobin: 8.6 g/dL — ABNORMAL LOW (ref 13.0–17.0)
MCH: 29.4 pg (ref 26.0–34.0)
MCHC: 32 g/dL (ref 30.0–36.0)
MCV: 91.8 fL (ref 80.0–100.0)
Platelets: 151 K/uL (ref 150–400)
RBC: 2.93 MIL/uL — ABNORMAL LOW (ref 4.22–5.81)
RDW: 13.7 % (ref 11.5–15.5)
WBC: 11.8 K/uL — ABNORMAL HIGH (ref 4.0–10.5)
nRBC: 0 % (ref 0.0–0.2)

## 2024-10-23 LAB — GLUCOSE, CAPILLARY
Glucose-Capillary: 160 mg/dL — ABNORMAL HIGH (ref 70–99)
Glucose-Capillary: 185 mg/dL — ABNORMAL HIGH (ref 70–99)
Glucose-Capillary: 189 mg/dL — ABNORMAL HIGH (ref 70–99)
Glucose-Capillary: 201 mg/dL — ABNORMAL HIGH (ref 70–99)
Glucose-Capillary: 190 mg/dL — ABNORMAL HIGH (ref 70–99)

## 2024-10-23 MED ORDER — INSULIN ASPART 100 UNIT/ML IJ SOLN
0.0000 [IU] | Freq: Three times a day (TID) | INTRAMUSCULAR | Status: DC
  Administered 2024-10-23: 7 [IU] via SUBCUTANEOUS
  Administered 2024-10-23: 4 [IU] via SUBCUTANEOUS
  Administered 2024-10-24: 7 [IU] via SUBCUTANEOUS
  Administered 2024-10-24 – 2024-10-25 (×2): 4 [IU] via SUBCUTANEOUS
  Filled 2024-10-23: qty 3
  Filled 2024-10-23: qty 7
  Filled 2024-10-23 (×2): qty 4
  Filled 2024-10-23: qty 7

## 2024-10-23 MED ORDER — AMIODARONE LOAD VIA INFUSION
150.0000 mg | Freq: Once | INTRAVENOUS | Status: AC
  Administered 2024-10-23: 150 mg via INTRAVENOUS
  Filled 2024-10-23: qty 83.34

## 2024-10-23 MED ORDER — MAGNESIUM SULFATE 2 GM/50ML IV SOLN
2.0000 g | Freq: Once | INTRAVENOUS | Status: AC
  Administered 2024-10-23: 2 g via INTRAVENOUS
  Filled 2024-10-23: qty 50

## 2024-10-23 MED ORDER — METOPROLOL TARTRATE 12.5 MG HALF TABLET
12.5000 mg | ORAL_TABLET | Freq: Two times a day (BID) | ORAL | Status: DC
  Administered 2024-10-23 – 2024-10-24 (×3): 12.5 mg via ORAL
  Filled 2024-10-23 (×3): qty 1

## 2024-10-23 MED ORDER — FUROSEMIDE 40 MG PO TABS
40.0000 mg | ORAL_TABLET | Freq: Every day | ORAL | Status: DC
  Administered 2024-10-23 – 2024-10-25 (×3): 40 mg via ORAL
  Filled 2024-10-23 (×3): qty 1

## 2024-10-23 MED ORDER — POTASSIUM CHLORIDE CRYS ER 20 MEQ PO TBCR
20.0000 meq | EXTENDED_RELEASE_TABLET | Freq: Every day | ORAL | Status: DC
  Administered 2024-10-23 – 2024-10-25 (×3): 20 meq via ORAL
  Filled 2024-10-23 (×3): qty 1

## 2024-10-23 MED ORDER — AMIODARONE HCL IN DEXTROSE 360-4.14 MG/200ML-% IV SOLN
60.0000 mg/h | INTRAVENOUS | Status: AC
  Administered 2024-10-23 (×2): 60 mg/h via INTRAVENOUS
  Filled 2024-10-23: qty 200

## 2024-10-23 MED ORDER — INSULIN ASPART 100 UNIT/ML IJ SOLN
0.0000 [IU] | Freq: Every day | INTRAMUSCULAR | Status: DC
  Filled 2024-10-23: qty 2

## 2024-10-23 MED ORDER — GLIPIZIDE 5 MG PO TABS
5.0000 mg | ORAL_TABLET | Freq: Every day | ORAL | Status: DC
  Administered 2024-10-24: 5 mg via ORAL
  Filled 2024-10-23 (×2): qty 1

## 2024-10-23 MED ORDER — METFORMIN HCL 500 MG PO TABS
500.0000 mg | ORAL_TABLET | Freq: Two times a day (BID) | ORAL | Status: DC
  Administered 2024-10-23 – 2024-10-27 (×8): 500 mg via ORAL
  Filled 2024-10-23 (×9): qty 1

## 2024-10-23 MED ORDER — AMIODARONE HCL IN DEXTROSE 360-4.14 MG/200ML-% IV SOLN
30.0000 mg/h | INTRAVENOUS | Status: AC
  Administered 2024-10-23: 30 mg/h via INTRAVENOUS
  Filled 2024-10-23: qty 200

## 2024-10-23 NOTE — Progress Notes (Signed)
° °  [address] [place] [address]             [phone]   Sleeping  BP (!) 144/78   Pulse 76   Temp 98.4 F (36.9 C) (Oral)   Resp 17   Ht 5' 9 (1.753 m)   Wt 112.1 kg   SpO2 97%   BMI 36.50 kg/m    Intake/Output Summary (Last 24 hours) at 10/23/2024 1756 Last data filed at 10/23/2024 1700 Gross per 24 hour  Intake 866.17 ml  Output 835 ml  Net 31.17 ml    Converted back into SR on amiodarone   [name] [name] C. Kerrin, MD [facility] [phone]

## 2024-10-23 NOTE — Progress Notes (Signed)
 Inpatient Rehab Admissions:  Inpatient Rehab Consult received.  I met with pt at the bedside for rehabilitation assessment and to discuss goals and expectations of an inpatient rehab admission.  Discussed average length of stay, insurance authorization requirement and discharge home after completion of CIR. Pt acknowledged understanding and is interested in pursuing CIR. Pt gave permission to contact daughter Joni. Spoke with Joni on the telephone. She also acknowledged understanding of CIR goals and expectations. Joni is supportive of pt pursuing CIR. Joni confirmed that family will be able to provide 24/7 support for pt after discharge. Will continue to follow.  Signed: Tinnie Yvone Cohens, MS, CCC-SLP Admissions Coordinator [phone]

## 2024-10-23 NOTE — Progress Notes (Signed)
 NAME:  [name] [name]., MRN:  [id number], DOB:  1946-04-23, LOS: 2 ADMISSION DATE:  10/21/2024, CONSULTATION DATE:  10/21/24  REFERRING MD:  [name] CHIEF COMPLAINT:  CAD   History of Present Illness:  Pt is a 78 yr old male with significant hx of DM, HLD, HTN, and CAD s/p PTCA (1990s), cardiac cath on 09/20/24 showing severe 3 vessel CAD, who presents for a CABG x 3 by MD Bartle. PCCM Consulted to assist with post-op vent management and critical care while in ICU.   Xclamp: 68 mins  CPB: 88 mins   EBL: 150 cc  Albumin  given: 250 cc   Cell saver: 210 cc    Pertinent  Medical History   Past Medical History:  Diagnosis Date   CAD (coronary artery disease)    Cancer (HCC)    Diabetes (HCC)    Dyslipidemia    Gout    History of kidney stones    HTN (hypertension)    Myocardial infarction (HCC)      Significant Hospital Events: Including procedures, antibiotic start and stop dates in addition to other pertinent events   12/11 CABG x 3  Interim History / Subjective:  New onset Afib- short run yesterday, self aborted, now more persistent.  Objective    Blood pressure (!) 146/77, pulse 82, temperature 98.8 F (37.1 C), resp. rate 18, height 5' 9 (1.753 m), weight 112.1 kg, SpO2 98%.        Intake/Output Summary (Last 24 hours) at 10/23/2024 0958 Last data filed at 10/23/2024 0700 Gross per 24 hour  Intake 436.07 ml  Output 780 ml  Net -343.93 ml   Filed Weights   10/21/24 0555 10/22/24 0424 10/23/24 0500  Weight: 104.9 kg 115.2 kg 112.1 kg    Examination: General: elderly man sitting up in the chair in NAD HEENT: [place]/AT, eyes anicteric CV: S1S2, RRR Pulm: breathing comfortably on [place], reduced basilar breath sounds Extremities: +edema Neuro: alert, answering questions appropriately GU: foley with amber urine  Na+ 134 BUN 21 Cr 1.21 WBC 11.8 H/H 8.6/26.9 Platelets 151 CXR personally reviewed> bilateral dependent effusions, RIJ CVC  Resolved problem  list  Post op vent management   Assessment and Plan  Severe 3 vessel CAD with NSTEMI s/p CABGx3 (LIMA-LAD, VG-OM2, VG-OM3) -post op management per [facility] - completing post-op antibiotics today -aspirin , statin -metoprolol  -pain control per protocol- morphine , tramadol , oxycodone  -d/c foley -keep CVC for amiodarone  -progress diet and mobility -lasix  40mg  daily -pulmonary hygiene, wean off O2  Afib with RVR, new onset, paroxysmal -amiodarone , empiric Mg+  -monitor electrolytes, replete PRN -tele monitoring  S/p cervical spine surgery; Residual right sided weakness and hoarseness. -PT, OT, progressing mobility well  Hypertension -holding PTA imdur  & amlodipine   Hyperlipidemia - Continue statin  Diabetes type 2; A1c 8.5 -back on PTA metformin , glipizide  -glargine 10 units daily -SSI PRN -goal BG 140-180  BPH -tamsulosin  -monitor for retention after foley removal  Expected acute blood loss anemia - Continue to monitor daily CBC  History of gout - Continue home allopurinol    DVT prophylaxis- lovenox    Labs   CBC: Recent Labs  Lab 10/21/24 1340 10/21/24 1342 10/21/24 1856 10/21/24 2004 10/22/24 0547 10/22/24 1606 10/23/24 0434  WBC 10.4  --   --  12.8* 13.2* 11.9* 11.8*  HGB 9.3*   < > 9.5* 9.6* 9.8* 9.3* 8.6*  HCT 28.7*   < > 28.0* 29.7* 30.0* 29.1* 26.9*  MCV 89.4  --   --  90.5 90.4 90.9 91.8  PLT 161  --   --  204 216 184 151   < > = values in this interval not displayed.    Basic Metabolic Panel: Recent Labs  Lab 10/19/24 1220 10/21/24 0826 10/21/24 1216 10/21/24 1342 10/21/24 1856 10/21/24 2004 10/22/24 0547 10/22/24 1606 10/23/24 0434 10/23/24 0746  NA 138   < > 141   < > 139 138 135 134* 134*  --   K 4.3   < > 4.3   < > 4.3 4.2 4.1 4.3 4.1  --   CL 103   < > 102  --   --  107 105 104 102  --   CO2 25  --   --   --   --  23 22 23 23   --   GLUCOSE 133*   < > 161*  --   --  118* 130* 166* 151*  --   BUN 12   < > 17  --   --  15 17 19  21   --   CREATININE 0.79   < > 0.80  --   --  0.89 1.04 1.27* 1.21  --   CALCIUM  8.8*  --   --   --   --  7.7* 7.3* 7.6* 7.5*  --   MG  --   --   --   --   --  2.5* 2.2 2.0  --  2.1  PHOS  --   --   --   --   --   --  3.5  --   --   --    < > = values in this interval not displayed.   GFR: Estimated Creatinine Clearance: 63.1 mL/min (by C-G formula based on SCr of 1.21 mg/dL). Recent Labs  Lab 10/21/24 2004 10/22/24 0547 10/22/24 1606 10/23/24 0434  WBC 12.8* 13.2* 11.9* 11.8*     HbA1C: Hgb A1c MFr Bld  Date/Time Value Ref Range Status  10/19/2024 12:00 PM 8.5 (H) 4.8 - 5.6 % Final    Comment:    (NOTE) Diagnosis of Diabetes The following HbA1c ranges recommended by the American Diabetes Association (ADA) may be used as an aid in the diagnosis of diabetes mellitus.  Hemoglobin             Suggested A1C NGSP%              Diagnosis  <5.7                   Non Diabetic  5.7-6.4                Pre-Diabetic  >6.4                   Diabetic  <7.0                   Glycemic control for                       adults with diabetes.    02/11/2022 05:50 AM 7.0 (H) 4.8 - 5.6 % Final    Comment:    (NOTE)         Prediabetes: 5.7 - 6.4         Diabetes: >6.4         Glycemic control for adults with diabetes: <7.0     CBG: Recent Labs  Lab 10/22/24 1630 10/22/24 1956 10/22/24 2314  10/23/24 0321 10/23/24 0721  GLUCAP 155* 189* 167* 160* 185*      Leita SHAUNNA Gaskins, DO 10/23/2024 10:09 AM [facility]  For contact information, see Amion. If no response to pager, please call PCCM consult pager. After hours, 7PM- 7AM, please call Elink.

## 2024-10-23 NOTE — PMR Pre-admission (Shared)
 PMR Admission Coordinator Pre-Admission Assessment  Patient: [name] Som. is an 78 y.o., male MRN: [id number] DOB: 08/19/1946 Height: 5' 9 (175.3 cm) Weight: 112.1 kg  Insurance Information HMO: ***    PPO: ***     PCP:      IPA:      80/20:     OTHER:  PRIMARY: [employer] Medicare      Policy#: [id number]      Subscriber: patient CM Name: ***      Phone#: ***     Fax#: *** Pre-Cert#: ***      Employer: *** Benefits:  Phone #: ***     Name: *** Eff. Date: ***     Deduct: ***      Out of Pocket Max: ***      Life Max: *** CIR: ***      SNF: *** Outpatient: ***     Co-Pay: *** Home Health: ***      Co-Pay: *** DME: ***     Co-Pay: *** Providers: in-network SECONDARY:       Policy#:      Phone#:   Financial Counselor:       Phone#:   The [employer] for patients in Inpatient Rehabilitation Facilities with attached Privacy Act Statement-Health Care Records was provided and verbally reviewed with: Patient  Emergency Contact Information Contact Information     Name Relation Home Work Mobile   [name] [name] Daughter   [phone]   [name] [name], [name] [name]   [phone]   [name] [name]   [phone]      Other Contacts   None on File     Current Medical History  Patient Admitting Diagnosis: s/p CABG x3 History of Present Illness: Pt is a 78 year old male with medical hx significant for: DM, dyslipidemia, HTN, spinal surgery, CAD s/p PTCA (1990s), severe three-vessel coronary disease.  Pt had been reporting  increasing SOB and intermittent chest discomfort over 6-9 months. PET CT suggested coronary disease particularly in left circumflex distribution. Cardiac catheterization on 09/20/24 showed severe three-vessel coronary disease. Pt presented to [facility] on 10/21/24 for scheduled CABG by Dr. [name]. Chest x-ray on 12/12 showed cardiomegaly, bibasilar atelectasis and small left pleural effusion. On 12/13, pt in A-fib with RVR. Started on  amiodarone  protocol.*** Therapy evaluations completed and CIR recommended d/t pt's deficits in functional mobility.    Patient's medical record from [facility] has been reviewed by the rehabilitation admission coordinator and physician.  Past Medical History  Past Medical History:  Diagnosis Date   CAD (coronary artery disease)    Cancer (HCC)    Diabetes (HCC)    Dyslipidemia    Gout    History of kidney stones    HTN (hypertension)    Myocardial infarction [facility])     Has the patient had major surgery during 100 days prior to admission? Yes  Family History   family history includes Colon cancer (age of onset: 81) in his brother; Heart attack (age of onset: 55) in his father.  Current Medications Current Medications[1]  Patients Current Diet:  Diet Order             Diet heart healthy/carb modified Room service appropriate? Yes; Fluid consistency: Thin  Diet effective now                   Precautions / Restrictions Precautions Precautions: Fall, Sternal Precaution Booklet Issued: No Precaution/Restrictions Comments: reviewed precautions, watch vitals, temp pacer, Y-chest tube  Restrictions Weight Bearing Restrictions Per Provider Order: Yes RUE Weight Bearing Per Provider Order: Non weight bearing LUE Weight Bearing Per Provider Order: Non weight bearing Other Position/Activity Restrictions: sternal precautions   Has the patient had 2 or more falls or a fall with injury in the past year? No  Prior Activity Level Community (5-7x/wk): gets out of house often, drives  Prior Functional Level Self Care: Did the patient need help bathing, dressing, using the toilet or eating? Independent  Indoor Mobility: Did the patient need assistance with walking from room to room (with or without device)? Independent  Stairs: Did the patient need assistance with internal or external stairs (with or without device)? Independent  Functional Cognition: Did the patient  need help planning regular tasks such as shopping or remembering to take medications? Independent  Patient Information Are you of Hispanic, Latino/a,or Spanish origin?: A. No, not of Hispanic, Latino/a, or Spanish origin What is your race?: A. White Do you need or want an interpreter to communicate with a doctor or health care staff?: 0. No  Patient's Response To:  Health Literacy and Transportation Is the patient able to respond to health literacy and transportation needs?: Yes Health Literacy - How often do you need to have someone help you when you read instructions, pamphlets, or other written material from your doctor or pharmacy?: Never In the past 12 months, has lack of transportation kept you from medical appointments or from getting medications?: No In the past 12 months, has lack of transportation kept you from meetings, work, or from getting things needed for daily living?: No  Journalist, Newspaper / Equipment Home Equipment: Grab bars - tub/shower, Medical Laboratory Scientific Officer - single point, Agricultural Consultant (2 wheels), Other (comment) (w/c available at his complex per pt)  Prior Device Use: Indicate devices/aids used by the patient prior to current illness, exacerbation or injury? cane  Current Functional Level Cognition  Orientation Level: Oriented X4    Extremity Assessment (includes Sensation/Coordination)  Upper Extremity Assessment: Generalized weakness, RUE deficits/detail RUE Deficits / Details: Hx of R sided weakness, decreased grip strength and light touch RUE Sensation: decreased light touch  Lower Extremity Assessment: Defer to PT evaluation RLE Deficits / Details: hx of R leg weakness since spinal surgery a few years ago, gross MMT scores of 3 to 3+; hypersensitivity to touch at bil feet, but pt unable to detect touch throughout R leg (hx of numbness since spinal surgery a few years ago) RLE Sensation: decreased light touch LLE Deficits / Details: hypersensitivity to touch at L foot,  otherwise sensation intact throughout; grossly 4 to 4+ with MMT    ADLs  Overall ADL's : Needs assistance/impaired Eating/Feeding: Set up, Sitting Grooming: Set up, Sitting Upper Body Bathing: Minimal assistance, Sitting Lower Body Bathing: Moderate assistance, Sit to/from stand Upper Body Dressing : Minimal assistance, Sitting Lower Body Dressing: Moderate assistance, Sit to/from stand Toilet Transfer: +2 for safety/equipment, Rolling walker (2 wheels), Minimal assistance Functional mobility during ADLs: Moderate assistance, Rolling walker (2 wheels) General ADL Comments: Limited by decreased activity toleramce    Mobility  Overal bed mobility: Needs Assistance Bed Mobility: Rolling, Sidelying to Sit, Sit to Sidelying Rolling: Mod assist, +2 for physical assistance, +2 for safety/equipment Sidelying to sit: Mod assist, +2 for safety/equipment, +2 for physical assistance, HOB elevated Sit to sidelying: Mod assist, +2 for physical assistance, +2 for safety/equipment General bed mobility comments: Cues for sequencing, mod assist +2 to roll to side, and scoot to EOB. mod +2 to  return to supine to manage BLEs and trunk    Transfers  Overall transfer level: Needs assistance Equipment used: Rolling walker (2 wheels) Transfers: Sit to/from Stand Sit to Stand: Min assist, +2 physical assistance, +2 safety/equipment General transfer comment: Cues to rock for momentum min +2 for boost    Ambulation / Gait / Stairs / Wheelchair Mobility  Ambulation/Gait Ambulation/Gait assistance: Min assist, Mod assist, +2 safety/equipment Gait Distance (Feet): 12 Feet Assistive device: Rolling walker (2 wheels) Gait Pattern/deviations: Step-to pattern, Decreased step length - right, Decreased step length - left, Decreased stride length, Shuffle, Decreased weight shift to right, Decreased weight shift to left General Gait Details: Pt initially ambulated with minimal foot advancement, shuffling his feet  slowly and unsteadily. Tactile cues provided for weight shifting bil and verbal cues provided to take longer steps with fair success, but continued shuffling noted. MinA for balance the majority of the time, but modA when pt would lean posteriorly when coughing and bracing chest with x1 UE. +2 for safety and line management. Gait velocity: reduced Gait velocity interpretation: <1.31 ft/sec, indicative of household ambulator    Posture / Balance Dynamic Sitting Balance Sitting balance - Comments: Static sitting EOB with CGA-minA for balance Balance Overall balance assessment: Needs assistance Sitting-balance support: No upper extremity supported, Feet supported Sitting balance-Leahy Scale: Fair Sitting balance - Comments: Static sitting EOB with CGA-minA for balance Postural control: Posterior lean Standing balance support: Bilateral upper extremity supported, Single extremity supported, During functional activity Standing balance-Leahy Scale: Poor Standing balance comment: Reliant on RW    Special considerations/life events  Continuous Drip IV  ***, Oxygen ***, Skin Surgical Incision: leg/right; Surgical incision: chest, and Diabetic management Novolog  0-20 units 3x daily with meals; Lantus  10 units every 24 hours   Previous Home Environment (from acute therapy documentation) Living Arrangements: Alone Available Help at Discharge: Family, Available 24 hours/day (grandchildren and children can arrange it per pt) Type of Home: Apartment Home Layout: One level Home Access: Elevator Bathroom Shower/Tub: Engineer, Manufacturing Systems: Standard Bathroom Accessibility: Yes How Accessible: Accessible via walker Home Care Services: No  Discharge Living Setting Plans for Discharge Living Setting: Patient's home Type of Home at Discharge: Apartment Discharge Home Layout: One level Discharge Home Access: Elevator Discharge Bathroom Shower/Tub: Tub/shower unit Discharge Bathroom Toilet:  Standard Discharge Bathroom Accessibility: Yes How Accessible: Accessible via walker Does the patient have any problems obtaining your medications?: No  Social/Family/Support Systems Anticipated Caregiver: Azeez Dunker (daughter) and other children Anticipated Caregiver's Contact Information: [phone] Caregiver Availability: 24/7 Discharge Plan Discussed with Primary Caregiver: Yes Is Caregiver In Agreement with Plan?: Yes Does Caregiver/Family have Issues with Lodging/Transportation while Pt is in Rehab?: No  Goals Patient/Family Goal for Rehab: *** Expected length of stay: *** Pt/Family Agrees to Admission and willing to participate: Yes Program Orientation Provided & Reviewed with Pt/Caregiver Including Roles  & Responsibilities: Yes  Decrease burden of Care through IP rehab admission: NA  Possible need for SNF placement upon discharge: Not anticipated  Patient Condition: {PATIENT'S CONDITION:22832}  Preadmission Screen Completed By:  Tinnie SHAUNNA Yvone Delayne, 10/23/2024 11:04 AM ______________________________________________________________________   Discussed status with Dr. PIERRETTE on *** at *** and received approval for admission today.  Admission Coordinator:  Tinnie SHAUNNA Yvone Delayne, CCC-SLP, time ***/Date ***   Assessment/Plan: Diagnosis: *** Does the need for close, 24 hr/day Medical supervision in concert with the patient's rehab needs make it unreasonable for this patient to be served in a less intensive setting? {yes_no_potentially:3041433} Co-Morbidities  requiring supervision/potential complications: *** Due to {due un:6958565}, does the patient require 24 hr/day rehab nursing? {yes_no_potentially:3041433} Does the patient require coordinated care of a physician, rehab nurse, PT, OT, and SLP to address physical and functional deficits in the context of the above medical diagnosis(es)? {yes_no_potentially:3041433} Addressing deficits in the following areas:  {deficits:3041436} Can the patient actively participate in an intensive therapy program of at least 3 hrs of therapy 5 days a week? {yes_no_potentially:3041433} The potential for patient to make measurable gains while on inpatient rehab is {potential:3041437} Anticipated functional outcomes upon discharge from inpatient rehab: {functional outcomes:304600100} PT, {functional outcomes:304600100} OT, {functional outcomes:304600100} SLP Estimated rehab length of stay to reach the above functional goals is: *** Anticipated discharge destination: {anticipated dc setting:21604} 10. Overall Rehab/Functional Prognosis: {potential:3041437}   MD Signature: ***    [1]  Current Facility-Administered Medications:    acetaminophen  (TYLENOL ) tablet 1,000 mg, 1,000 mg, Oral, Q6H, 1,000 mg at 10/23/24 0604 **OR** [DISCONTINUED] acetaminophen  (TYLENOL ) 160 MG/5ML solution 1,000 mg, 1,000 mg, Per Tube, Q6H, Roddenberry, Myron G, PA-C   allopurinol  (ZYLOPRIM ) tablet 100 mg, 100 mg, Oral, q AM, Roddenberry, Myron G, PA-C, 100 mg at 10/23/24 0604   amiodarone  (NEXTERONE  PREMIX) 360-4.14 MG/200ML-% (1.8 mg/mL) IV infusion, 60 mg/hr, Intravenous, Continuous, Last Rate: 33.3 mL/hr at 10/23/24 1033, 60 mg/hr at 10/23/24 1033 **FOLLOWED BY** amiodarone  (NEXTERONE  PREMIX) 360-4.14 MG/200ML-% (1.8 mg/mL) IV infusion, 30 mg/hr, Intravenous, Continuous, Kerrin Elspeth BROCKS, MD   aspirin  EC tablet 325 mg, 325 mg, Oral, Daily, 325 mg at 10/23/24 9078 **OR** [DISCONTINUED] aspirin  chewable tablet 324 mg, 324 mg, Per Tube, Daily, Roddenberry, Myron G, PA-C   atorvastatin  (LIPITOR ) tablet 40 mg, 40 mg, Oral, q AM, Roddenberry, Myron G, PA-C, 40 mg at 10/23/24 0604   bisacodyl  (DULCOLAX) EC tablet 10 mg, 10 mg, Oral, Daily, 10 mg at 10/23/24 0921 **OR** bisacodyl  (DULCOLAX) suppository 10 mg, 10 mg, Rectal, Daily, Roddenberry, Myron G, PA-C   Chlorhexidine  Gluconate Cloth 2 % PADS 6 each, 6 each, Topical, Daily, [name] Dorise POUR,  MD, 6 each at 10/23/24 9071   Chlorhexidine  Gluconate Cloth 2 % PADS 6 each, 6 each, Topical, Daily, Bartle, Bryan K, MD, 6 each at 10/22/24 0912   dextrose  50 % solution 0-50 mL, 0-50 mL, Intravenous, PRN, Roddenberry, Myron G, PA-C   docusate sodium  (COLACE) capsule 200 mg, 200 mg, Oral, Daily, Roddenberry, Myron G, PA-C, 200 mg at 10/23/24 9078   enoxaparin  (LOVENOX ) injection 40 mg, 40 mg, Subcutaneous, Q24H, Zimmerman, Donielle M, PA-C, 40 mg at 10/22/24 2003   finasteride  (PROSCAR ) tablet 5 mg, 5 mg, Oral, Daily, Roddenberry, Myron G, PA-C, 5 mg at 10/23/24 9077   furosemide  (LASIX ) tablet 40 mg, 40 mg, Oral, Daily, Kerrin Elspeth BROCKS, MD   [START ON 10/24/2024] glipiZIDE  (GLUCOTROL ) tablet 5 mg, 5 mg, Oral, QAC breakfast, Kerrin Elspeth BROCKS, MD   insulin  aspart (novoLOG ) injection 0-20 Units, 0-20 Units, Subcutaneous, TID WC, Kerrin Elspeth BROCKS, MD   insulin  aspart (novoLOG ) injection 0-5 Units, 0-5 Units, Subcutaneous, QHS, Hendrickson, Steven C, MD   insulin  glargine (LANTUS ) injection 10 Units, 10 Units, Subcutaneous, Q24H, Bartle, Bryan K, MD, 10 Units at 10/23/24 9071   lidocaine  (LIDODERM ) 5 % 1 patch, 1 patch, Transdermal, Q24H, Zimmerman, Donielle M, PA-C, 1 patch at 10/23/24 9078   magnesium  sulfate IVPB 2 g 50 mL, 2 g, Intravenous, Once, Gretta Leita SQUIBB, DO, Last Rate: 50 mL/hr at 10/23/24 1045, 2 g at 10/23/24 1045   metFORMIN  (GLUCOPHAGE ) tablet 500 mg,  500 mg, Oral, BID WC, Kerrin Elspeth BROCKS, MD   methocarbamol  (ROBAXIN ) tablet 500 mg, 500 mg, Oral, Q8H PRN, Zimmerman, Donielle M, PA-C, 500 mg at 10/23/24 0921   metoprolol  tartrate (LOPRESSOR ) injection 2.5 mg, 2.5 mg, Intravenous, Q6H PRN, Gretta Leita SQUIBB, DO   metoprolol  tartrate (LOPRESSOR ) tablet 12.5 mg, 12.5 mg, Oral, BID, Hendrickson, Steven C, MD   morphine  (PF) 2 MG/ML injection 1-4 mg, 1-4 mg, Intravenous, Q1H PRN, Roddenberry, Myron G, PA-C, 2 mg at 10/22/24 1439   ondansetron  (ZOFRAN ) injection 4 mg, 4 mg,  Intravenous, Q6H PRN, Roddenberry, Myron G, PA-C   Oral care mouth rinse, 15 mL, Mouth Rinse, PRN, [name] Dorise POUR, MD   oxyCODONE  (Oxy IR/ROXICODONE ) immediate release tablet 5-10 mg, 5-10 mg, Oral, Q3H PRN, Roddenberry, Myron G, PA-C, 5 mg at 10/23/24 9077   pantoprazole  (PROTONIX ) EC tablet 40 mg, 40 mg, Oral, Daily, Roddenberry, Myron G, PA-C   potassium chloride  SA (KLOR-CON  M) CR tablet 20 mEq, 20 mEq, Oral, Daily, Kerrin Elspeth BROCKS, MD   sodium chloride  flush (NS) 0.9 % injection 3 mL, 3 mL, Intravenous, Q12H, Roddenberry, Myron G, PA-C, 3 mL at 10/23/24 0932   sodium chloride  flush (NS) 0.9 % injection 3 mL, 3 mL, Intravenous, PRN, Roddenberry, Myron G, PA-C   tamsulosin  (FLOMAX ) capsule 0.4 mg, 0.4 mg, Oral, QHS, Roddenberry, Myron G, PA-C, 0.4 mg at 10/22/24 2220   traMADol  (ULTRAM ) tablet 50-100 mg, 50-100 mg, Oral, Q4H PRN, Roddenberry, Myron G, PA-C, 100 mg at 10/22/24 1035

## 2024-10-23 NOTE — Plan of Care (Signed)
°  Problem: Education: Goal: Knowledge of General Education information will improve Description: Including pain rating scale, medication(s)/side effects and non-pharmacologic comfort measures Outcome: Progressing   Problem: Clinical Measurements: Goal: Ability to maintain clinical measurements within normal limits will improve Outcome: Progressing Goal: Will remain free from infection Outcome: Progressing Goal: Diagnostic test results will improve Outcome: Progressing Goal: Respiratory complications will improve Outcome: Progressing Goal: Cardiovascular complication will be avoided Outcome: Progressing   Problem: Activity: Goal: Risk for activity intolerance will decrease Outcome: Progressing   Problem: Nutrition: Goal: Adequate nutrition will be maintained Outcome: Progressing   Problem: Coping: Goal: Level of anxiety will decrease Outcome: Progressing   Problem: Elimination: Goal: Will not experience complications related to bowel motility Outcome: Progressing Goal: Will not experience complications related to urinary retention Outcome: Progressing   Problem: Safety: Goal: Ability to remain free from injury will improve Outcome: Progressing   Problem: Education: Goal: Will demonstrate proper wound care and an understanding of methods to prevent future damage Outcome: Progressing Goal: Knowledge of disease or condition will improve Outcome: Progressing Goal: Knowledge of the prescribed therapeutic regimen will improve Outcome: Progressing Goal: Individualized Educational Video(s) Outcome: Progressing   Problem: Cardiac: Goal: Will achieve and/or maintain hemodynamic stability Outcome: Progressing

## 2024-10-23 NOTE — Progress Notes (Signed)
 2 Days Post-Op Procedures (LRB): CORONARY ARTERY BYPASS GRAFTING, TIMES 3 USING LEFT INTERNAL MAMMARY ARTERY AND ENDOSCOPICALLY HARVESTED RIGHT SAPHENOUS VEIN (N/A) ECHOCARDIOGRAM, TRANSESOPHAGEAL, INTRAOPERATIVE (N/A) Subjective: Up in chair, denies pain In A fib with RVR but unaware  Objective: Vital signs in last 24 hours: Temp:  [98.8 F (37.1 C)-100.6 F (38.1 C)] 98.8 F (37.1 C) (12/13 0400) Pulse Rate:  [70-151] 82 (12/13 0500) Cardiac Rhythm: Normal sinus rhythm (12/12 2000) Resp:  [10-23] 18 (12/13 0500) BP: (95-146)/(60-82) 146/77 (12/13 0400) SpO2:  [92 %-99 %] 98 % (12/13 0500) Weight:  [112.1 kg] 112.1 kg (12/13 0500)  Hemodynamic parameters for last 24 hours:    Intake/Output from previous day: 12/12 0701 - 12/13 0700 In: 540 [I.V.:240; IV Piggyback:300] Out: 895 [Urine:805; Chest Tube:90] Intake/Output this shift: No intake/output data recorded.  General appearance: alert, cooperative, and no distress Neurologic: intact Heart: irregularly irregular rhythm Lungs: diminished breath sounds bibasilar Abdomen: normal findings: soft, non-tender  Lab Results: Recent Labs    10/22/24 1606 10/23/24 0434  WBC 11.9* 11.8*  HGB 9.3* 8.6*  HCT 29.1* 26.9*  PLT 184 151   BMET:  Recent Labs    10/22/24 1606 10/23/24 0434  NA 134* 134*  K 4.3 4.1  CL 104 102  CO2 23 23  GLUCOSE 166* 151*  BUN 19 21  CREATININE 1.27* 1.21  CALCIUM  7.6* 7.5*    PT/INR:  Recent Labs    10/21/24 1340  LABPROT 17.2*  INR 1.3*   ABG    Component Value Date/Time   PHART 7.346 (L) 10/21/2024 1856   HCO3 23.2 10/21/2024 1856   TCO2 24 10/21/2024 1856   ACIDBASEDEF 2.0 10/21/2024 1856   O2SAT 98 10/21/2024 1856   CBG (last 3)  Recent Labs    10/22/24 2314 10/23/24 0321 10/23/24 0721  GLUCAP 167* 160* 185*    Assessment/Plan: S/P Procedures (LRB): CORONARY ARTERY BYPASS GRAFTING, TIMES 3 USING LEFT INTERNAL MAMMARY ARTERY AND ENDOSCOPICALLY HARVESTED RIGHT  SAPHENOUS VEIN (N/A) ECHOCARDIOGRAM, TRANSESOPHAGEAL, INTRAOPERATIVE (N/A) POD # 2 NEURO- chronic R UE and LE weakness, hoarseness  PT/OT CV- s/p CABG  In AF with RVR this AM  Start amiodarone  protocol, start low dose metoprolol   ASA  On Lipitor  RESP- IS for basilar atelectasis RENAL- creatinine normal, lytes OK  PO Lasix , K ENDO- CBG persistently elevated  Continue Lantus   Change SSI to [facility] and HS  Resume metformin  and glipizide  GI- tolerating diet Anemia secondary to ABL- Hgb 8.6, monitor SCD + enoxaparin    LOS: 2 days    [name] [name] 10/23/2024

## 2024-10-24 ENCOUNTER — Inpatient Hospital Stay (HOSPITAL_COMMUNITY)

## 2024-10-24 LAB — GLUCOSE, CAPILLARY
Glucose-Capillary: 219 mg/dL — ABNORMAL HIGH (ref 70–99)
Glucose-Capillary: 215 mg/dL — ABNORMAL HIGH (ref 70–99)
Glucose-Capillary: 159 mg/dL — ABNORMAL HIGH (ref 70–99)
Glucose-Capillary: 111 mg/dL — ABNORMAL HIGH (ref 70–99)
Glucose-Capillary: 141 mg/dL — ABNORMAL HIGH (ref 70–99)

## 2024-10-24 LAB — CBC
HCT: 26.7 % — ABNORMAL LOW (ref 39.0–52.0)
Hemoglobin: 8.4 g/dL — ABNORMAL LOW (ref 13.0–17.0)
MCH: 29.1 pg (ref 26.0–34.0)
MCHC: 31.5 g/dL (ref 30.0–36.0)
MCV: 92.4 fL (ref 80.0–100.0)
Platelets: 143 K/uL — ABNORMAL LOW (ref 150–400)
RBC: 2.89 MIL/uL — ABNORMAL LOW (ref 4.22–5.81)
RDW: 13.4 % (ref 11.5–15.5)
WBC: 9.6 K/uL (ref 4.0–10.5)
nRBC: 0 % (ref 0.0–0.2)

## 2024-10-24 LAB — BASIC METABOLIC PANEL WITH GFR
Anion gap: 7 (ref 5–15)
BUN: 20 mg/dL (ref 8–23)
CO2: 26 mmol/L (ref 22–32)
Calcium: 7.8 mg/dL — ABNORMAL LOW (ref 8.9–10.3)
Chloride: 100 mmol/L (ref 98–111)
Creatinine, Ser: 1.02 mg/dL (ref 0.61–1.24)
GFR, Estimated: >60 mL/min (ref >60)
Glucose, Bld: 167 mg/dL — ABNORMAL HIGH (ref 70–99)
Potassium: 3.9 mmol/L (ref 3.5–5.1)
Sodium: 133 mmol/L — ABNORMAL LOW (ref 135–145)

## 2024-10-24 MED ORDER — SODIUM CHLORIDE 0.9% FLUSH: 3.0000 mL | INTRAVENOUS | Status: DC | PRN

## 2024-10-24 MED ORDER — METOPROLOL TARTRATE 25 MG PO TABS
25.0000 mg | ORAL_TABLET | Freq: Two times a day (BID) | ORAL | Status: DC
  Administered 2024-10-24 – 2024-10-27 (×6): 25 mg via ORAL
  Filled 2024-10-24 (×6): qty 1

## 2024-10-24 MED ORDER — INSULIN GLARGINE 100 UNIT/ML ~~LOC~~ SOLN
15.0000 [IU] | Freq: Once | SUBCUTANEOUS | Status: AC
  Administered 2024-10-24: 15 [IU] via SUBCUTANEOUS
  Filled 2024-10-24: qty 0.15

## 2024-10-24 MED ORDER — MAGNESIUM HYDROXIDE 400 MG/5ML PO SUSP: 30.0000 mL | Freq: Every day | ORAL | Status: DC | PRN

## 2024-10-24 MED ORDER — AMIODARONE HCL 200 MG PO TABS
400.0000 mg | ORAL_TABLET | Freq: Two times a day (BID) | ORAL | Status: DC
  Administered 2024-10-24 – 2024-10-27 (×7): 400 mg via ORAL
  Filled 2024-10-24 (×7): qty 2

## 2024-10-24 MED ORDER — MECLIZINE HCL 25 MG PO TABS: 25.0000 mg | ORAL_TABLET | Freq: Three times a day (TID) | ORAL | Status: DC | PRN

## 2024-10-24 MED ORDER — ~~LOC~~ CARDIAC SURGERY, PATIENT & FAMILY EDUCATION: Freq: Once | Status: AC

## 2024-10-24 MED ORDER — COLCHICINE 0.6 MG PO TABS: 0.6000 mg | ORAL_TABLET | Freq: Every day | ORAL | Status: DC | PRN

## 2024-10-24 MED ORDER — SODIUM CHLORIDE 0.9 % IV SOLN: 250.0000 mL | INTRAVENOUS | Status: AC | PRN

## 2024-10-24 MED ORDER — INSULIN GLARGINE 100 UNIT/ML ~~LOC~~ SOLN
25.0000 [IU] | SUBCUTANEOUS | Status: DC
  Filled 2024-10-24: qty 0.25

## 2024-10-24 MED ORDER — INSULIN ASPART 100 UNIT/ML IJ SOLN
0.0000 [IU] | Freq: Three times a day (TID) | INTRAMUSCULAR | Status: DC
  Administered 2024-10-24 – 2024-10-25 (×2): 2 [IU] via SUBCUTANEOUS
  Administered 2024-10-25 (×2): 4 [IU] via SUBCUTANEOUS
  Administered 2024-10-27: 12:00:00 2 [IU] via SUBCUTANEOUS
  Filled 2024-10-24: qty 1
  Filled 2024-10-24 (×2): qty 2
  Filled 2024-10-24: qty 4
  Filled 2024-10-24: qty 2
  Filled 2024-10-24: qty 1

## 2024-10-24 MED ORDER — FENTANYL CITRATE (PF) 50 MCG/ML IJ SOSY: 25.0000 ug | PREFILLED_SYRINGE | Freq: Once | INTRAMUSCULAR | Status: DC

## 2024-10-24 MED ORDER — SODIUM CHLORIDE 0.9% FLUSH
3.0000 mL | Freq: Two times a day (BID) | INTRAVENOUS | Status: DC
  Administered 2024-10-24 – 2024-10-27 (×7): 3 mL via INTRAVENOUS

## 2024-10-24 MED ORDER — ALUM & MAG HYDROXIDE-SIMETH 200-200-20 MG/5ML PO SUSP: 15.0000 mL | Freq: Four times a day (QID) | ORAL | Status: DC | PRN

## 2024-10-24 NOTE — Plan of Care (Signed)
°  Problem: Clinical Measurements: Goal: Ability to maintain clinical measurements within normal limits will improve Outcome: Progressing Goal: Will remain free from infection Outcome: Progressing Goal: Diagnostic test results will improve Outcome: Progressing Goal: Respiratory complications will improve Outcome: Progressing Goal: Cardiovascular complication will be avoided Outcome: Progressing   Problem: Activity: Goal: Risk for activity intolerance will decrease Outcome: Progressing   Problem: Nutrition: Goal: Adequate nutrition will be maintained Outcome: Progressing   Problem: Coping: Goal: Level of anxiety will decrease Outcome: Progressing   Problem: Safety: Goal: Ability to remain free from injury will improve Outcome: Progressing   Problem: Skin Integrity: Goal: Risk for impaired skin integrity will decrease Outcome: Progressing   Problem: Education: Goal: Will demonstrate proper wound care and an understanding of methods to prevent future damage Outcome: Progressing Goal: Knowledge of disease or condition will improve Outcome: Progressing Goal: Knowledge of the prescribed therapeutic regimen will improve Outcome: Progressing Goal: Individualized Educational Video(s) Outcome: Progressing   Problem: Cardiac: Goal: Will achieve and/or maintain hemodynamic stability Outcome: Progressing   Problem: Education: Goal: Ability to describe self-care measures that may prevent or decrease complications (Diabetes Survival Skills Education) will improve Outcome: Progressing   Problem: Fluid Volume: Goal: Ability to maintain a balanced intake and output will improve Outcome: Progressing   Problem: Metabolic: Goal: Ability to maintain appropriate glucose levels will improve Outcome: Progressing   Problem: Nutritional: Goal: Maintenance of adequate nutrition will improve Outcome: Progressing Goal: Progress toward achieving an optimal weight will improve Outcome:  Progressing   Problem: Tissue Perfusion: Goal: Adequacy of tissue perfusion will improve Outcome: Progressing

## 2024-10-24 NOTE — Progress Notes (Signed)
 NAME:  [name] [name]., MRN:  [id number], DOB:  Jan 25, 1946, LOS: 3 ADMISSION DATE:  10/21/2024, CONSULTATION DATE:  10/21/24  REFERRING MD:  [name] CHIEF COMPLAINT:  CAD   History of Present Illness:  Pt is a 78 yr old male with significant hx of DM, HLD, HTN, and CAD s/p PTCA (1990s), cardiac cath on 09/20/24 showing severe 3 vessel CAD, who presents for a CABG x 3 by MD Bartle. PCCM Consulted to assist with post-op vent management and critical care while in ICU.   Xclamp: 68 mins  CPB: 88 mins   EBL: 150 cc  Albumin  given: 250 cc   Cell saver: 210 cc    Pertinent  Medical History   Past Medical History:  Diagnosis Date   CAD (coronary artery disease)    Cancer (HCC)    Diabetes (HCC)    Dyslipidemia    Gout    History of kidney stones    HTN (hypertension)    Myocardial infarction (HCC)      Significant Hospital Events: Including procedures, antibiotic start and stop dates in addition to other pertinent events   12/11 CABG x 3  Interim History / Subjective:  Stayed out of Afib. Doing well with walking.   Objective    Blood pressure (!) 131/111, pulse 77, temperature (!) 97.4 F (36.3 C), temperature source Axillary, resp. rate 17, height 5' 9 (1.753 m), weight 109.1 kg, SpO2 96%.        Intake/Output Summary (Last 24 hours) at 10/24/2024 1523 Last data filed at 10/24/2024 1300 Gross per 24 hour  Intake 767.65 ml  Output 1270 ml  Net -502.35 ml   Filed Weights   10/22/24 0424 10/23/24 0500 10/24/24 0500  Weight: 115.2 kg 112.1 kg 109.1 kg    Examination: General: elderly man lying in bed in NAD HEENT: [place]/AT, yes anicteric CV: S1S2, RRR Pulm: breathing comfortably on Hydro, reduced basilar breath sounds Extremities: +edema Neuro: awake and alert   Na+ 133 BUN 20 Cr 1.02 WBC 9.6 H/H 8.4/26.7 Platelets 143 CXR personally reviewed> bilateral dependent pleural effusions.  Resolved problem list  Post op vent management   Assessment and Plan   Severe 3- vessel CAD with NSTEMI s/p CABGx3 (LIMA-LAD, VG-OM2, VG-OM3) Post-op new onset Afib; back in NSR -post-op management per TCTS -aspirin , statin, metoprolol  -amiodarone  -can hold on AC unless he goes back into Afib -progress diet and mobility -needs more diuresis -pulmonary hygiene  Afib with RVR, new onset, paroxysmal -amiodarone  oral load -monitor electrolytes -tele monitoring  S/p cervical spine surgery; Residual right sided weakness and hoarseness. -PT, OT  Hypertension -holding PTA imdur  and amlodipine   Hyperlipidemia - statin  Diabetes type 2; A1c 8.5 -con't PTA glipizide  & metformin  -glargine increased to 15 units daily -SSI PRN  BPH -tamsulosin   History of gout - Continue home allopurinol    DVT prophylaxis- lovenox   Transferring to the floor today. PCCM will be available as needed.   Labs   CBC: Recent Labs  Lab 10/21/24 2004 10/22/24 0547 10/22/24 1606 10/23/24 0434 10/24/24 0429  WBC 12.8* 13.2* 11.9* 11.8* 9.6  HGB 9.6* 9.8* 9.3* 8.6* 8.4*  HCT 29.7* 30.0* 29.1* 26.9* 26.7*  MCV 90.5 90.4 90.9 91.8 92.4  PLT 204 216 184 151 143*    Basic Metabolic Panel: Recent Labs  Lab 10/21/24 2004 10/22/24 0547 10/22/24 1606 10/23/24 0434 10/23/24 0746 10/24/24 0429  NA 138 135 134* 134*  --  133*  K 4.2 4.1 4.3 4.1  --  3.9  CL 107 105 104 102  --  100  CO2 23 22 23 23   --  26  GLUCOSE 118* 130* 166* 151*  --  167*  BUN 15 17 19 21   --  20  CREATININE 0.89 1.04 1.27* 1.21  --  1.02  CALCIUM  7.7* 7.3* 7.6* 7.5*  --  7.8*  MG 2.5* 2.2 2.0  --  2.1  --   PHOS  --  3.5  --   --   --   --    GFR: Estimated Creatinine Clearance: 73.9 mL/min (by C-G formula based on SCr of 1.02 mg/dL). Recent Labs  Lab 10/22/24 0547 10/22/24 1606 10/23/24 0434 10/24/24 0429  WBC 13.2* 11.9* 11.8* 9.6        Leita SHAUNNA Gaskins, DO 10/24/2024 3:23 PM [facility]  For contact information, see Amion. If no response to pager,  please call PCCM consult pager. After hours, 7PM- 7AM, please call Elink.

## 2024-10-24 NOTE — Progress Notes (Signed)
 3 Days Post-Op Procedures (LRB): CORONARY ARTERY BYPASS GRAFTING, TIMES 3 USING LEFT INTERNAL MAMMARY ARTERY AND ENDOSCOPICALLY HARVESTED RIGHT SAPHENOUS VEIN (N/A) ECHOCARDIOGRAM, TRANSESOPHAGEAL, INTRAOPERATIVE (N/A) Subjective: No complaints this AM Ambulated better this AM  Objective: Vital signs in last 24 hours: Temp:  [97 F (36.1 C)-100 F (37.8 C)] 97 F (36.1 C) (12/14 0700) Pulse Rate:  [69-127] 92 (12/14 0900) Cardiac Rhythm: Normal sinus rhythm (12/14 0800) Resp:  [10-29] 21 (12/14 0900) BP: (114-156)/(58-127) 147/127 (12/14 0900) SpO2:  [84 %-100 %] 84 % (12/14 0900) Weight:  [109.1 kg] 109.1 kg (12/14 0500)  Hemodynamic parameters for last 24 hours:    Intake/Output from previous day: 12/13 0701 - 12/14 0700 In: 935.7 [P.O.:260; I.V.:563.5; IV Piggyback:112.2] Out: 1075 [Urine:1075] Intake/Output this shift: Total I/O In: 393.4 [P.O.:360; I.V.:33.4] Out: 80 [Urine:80]  General appearance: alert, cooperative, and no distress Neurologic: intact Heart: regular rate and rhythm Lungs: diminished breath sounds bibasilar Abdomen: normal findings: soft, non-tender  Lab Results: Recent Labs    10/23/24 0434 10/24/24 0429  WBC 11.8* 9.6  HGB 8.6* 8.4*  HCT 26.9* 26.7*  PLT 151 143*   BMET:  Recent Labs    10/23/24 0434 10/24/24 0429  NA 134* 133*  K 4.1 3.9  CL 102 100  CO2 23 26  GLUCOSE 151* 167*  BUN 21 20  CREATININE 1.21 1.02  CALCIUM  7.5* 7.8*    PT/INR:  Recent Labs    10/21/24 1340  LABPROT 17.2*  INR 1.3*   ABG    Component Value Date/Time   PHART 7.346 (L) 10/21/2024 1856   HCO3 23.2 10/21/2024 1856   TCO2 24 10/21/2024 1856   ACIDBASEDEF 2.0 10/21/2024 1856   O2SAT 98 10/21/2024 1856   CBG (last 3)  Recent Labs    10/23/24 2104 10/24/24 0615 10/24/24 0817  GLUCAP 190* 219* 215*    Assessment/Plan: S/P Procedures (LRB): CORONARY ARTERY BYPASS GRAFTING, TIMES 3 USING LEFT INTERNAL MAMMARY ARTERY AND ENDOSCOPICALLY  HARVESTED RIGHT SAPHENOUS VEIN (N/A) ECHOCARDIOGRAM, TRANSESOPHAGEAL, INTRAOPERATIVE (N/A) POD # 3 NEURO- intact CV- converted to Sr with amiodarone   Convert amiodarone  to PO  Increase metoprolol  to 25 mg BID  Statin- Lipitor  RESP- continue IS RENAL- creatinine normal, mild hyponatremia Po Lasix  and K ENDO- CBG still elevated  Increase Lantus   Continue SSI GI- tolerating diet SCD + enoxaparin  for DVT prophylaxis Dc Foley and central line Transfer to 4E  LOS: 3 days    [name] [name] 10/24/2024

## 2024-10-25 ENCOUNTER — Inpatient Hospital Stay (HOSPITAL_COMMUNITY)

## 2024-10-25 ENCOUNTER — Other Ambulatory Visit (HOSPITAL_COMMUNITY): Payer: Self-pay

## 2024-10-25 DIAGNOSIS — R5381 Other malaise: Secondary | ICD-10-CM

## 2024-10-25 DIAGNOSIS — Z951 Presence of aortocoronary bypass graft: Principal | ICD-10-CM

## 2024-10-25 LAB — CBC
HCT: 26.6 % — ABNORMAL LOW (ref 39.0–52.0)
Hemoglobin: 8.7 g/dL — ABNORMAL LOW (ref 13.0–17.0)
MCH: 29.5 pg (ref 26.0–34.0)
MCHC: 32.7 g/dL (ref 30.0–36.0)
MCV: 90.2 fL (ref 80.0–100.0)
Platelets: 202 K/uL (ref 150–400)
RBC: 2.95 MIL/uL — ABNORMAL LOW (ref 4.22–5.81)
RDW: 13.5 % (ref 11.5–15.5)
WBC: 9.3 K/uL (ref 4.0–10.5)
nRBC: 0.2 % (ref 0.0–0.2)

## 2024-10-25 LAB — BASIC METABOLIC PANEL WITH GFR
Anion gap: 7 (ref 5–15)
BUN: 22 mg/dL (ref 8–23)
CO2: 25 mmol/L (ref 22–32)
Calcium: 8.2 mg/dL — ABNORMAL LOW (ref 8.9–10.3)
Chloride: 102 mmol/L (ref 98–111)
Creatinine, Ser: 0.96 mg/dL (ref 0.61–1.24)
GFR, Estimated: >60 mL/min (ref >60)
Glucose, Bld: 160 mg/dL — ABNORMAL HIGH (ref 70–99)
Potassium: 4 mmol/L (ref 3.5–5.1)
Sodium: 134 mmol/L — ABNORMAL LOW (ref 135–145)

## 2024-10-25 LAB — GLUCOSE, CAPILLARY
Glucose-Capillary: 171 mg/dL — ABNORMAL HIGH (ref 70–99)
Glucose-Capillary: 164 mg/dL — ABNORMAL HIGH (ref 70–99)
Glucose-Capillary: 190 mg/dL — ABNORMAL HIGH (ref 70–99)
Glucose-Capillary: 128 mg/dL — ABNORMAL HIGH (ref 70–99)

## 2024-10-25 MED ORDER — INSULIN GLARGINE 100 UNIT/ML ~~LOC~~ SOLN
20.0000 [IU] | SUBCUTANEOUS | Status: DC
  Administered 2024-10-25 – 2024-10-27 (×3): 20 [IU] via SUBCUTANEOUS
  Filled 2024-10-25 (×3): qty 0.2

## 2024-10-25 MED ORDER — LACTULOSE 10 GM/15ML PO SOLN
20.0000 g | Freq: Once | ORAL | Status: AC
  Administered 2024-10-25: 10:00:00 20 g via ORAL
  Filled 2024-10-25: qty 30

## 2024-10-25 MED ORDER — LOSARTAN POTASSIUM 25 MG PO TABS
25.0000 mg | ORAL_TABLET | Freq: Every day | ORAL | Status: DC
  Administered 2024-10-25 – 2024-10-27 (×3): 25 mg via ORAL
  Filled 2024-10-25 (×4): qty 1

## 2024-10-25 MED ORDER — GLIPIZIDE 5 MG PO TABS
5.0000 mg | ORAL_TABLET | Freq: Two times a day (BID) | ORAL | Status: DC
  Administered 2024-10-25 – 2024-10-27 (×5): 5 mg via ORAL
  Filled 2024-10-25 (×6): qty 1

## 2024-10-25 MED ORDER — LACTULOSE 10 GM/15ML PO SOLN
20.0000 g | Freq: Once | ORAL | Status: DC
  Filled 2024-10-25: qty 30

## 2024-10-25 NOTE — Progress Notes (Signed)
 Mobility Specialist Progress Note;   10/25/24 1348  Mobility  Activity Ambulated with assistance;Pivoted/transferred from chair to bed  Level of Assistance Contact guard assist, steadying assist  Assistive Device Front wheel walker  Distance Ambulated (ft) 120 ft  RUE Weight Bearing Per Provider Order NWB  LUE Weight Bearing Per Provider Order NWB  Activity Response Tolerated fair  Mobility Referral Yes  Mobility visit 1 Mobility  Mobility Specialist Start Time (ACUTE ONLY) 1348  Mobility Specialist Stop Time (ACUTE ONLY) 1404  Mobility Specialist Time Calculation (min) (ACUTE ONLY) 16 min   Pt agreeable to mobility w/ encouragement, states his back is hurting. Required no physical assistance to stand, able to follow sternal precautions wells. MinG assistance during ambulation. Took 2x standing rest breaks d/t SOB and back pain, SPO2 97%. Requested to be returned to bed at [facility] Pt left comfortably in bed with all needs met, alarm on.   Lauraine Erm Mobility Specialist Please contact via SecureChat or [employer] [phone]

## 2024-10-25 NOTE — Consult Note (Signed)
 Physical Medicine and Rehabilitation Consult Reason for Consult:debility Referring Physician: [name]   HPI: [name] [name]. is a 78 y.o. male with a hx of DM, HTN, cervical spine surgery (right sided weakness), and CAD w/ severe 3 vessel CAD who presented for CABG x3 performed on 12/11 by Dr. [name]. Post-op pt with a-fib/RVR, loaded with amiodarone . She's been up with therapies and has been min assist for sit-stand transfers and min-mod assist 41ft using a rolling walker. Pt was mod I prior to admit and lives alone but has some family who can stay with him in his one level apartment with elevator access.     Home: Home Living Family/patient expects to be discharged to:: Private residence Living Arrangements: Alone Available Help at Discharge: Family, Available 24 hours/day (grandchildren and children can arrange it per pt) Type of Home: Apartment Home Access: Elevator Home Layout: One level Bathroom Shower/Tub: Engineer, Manufacturing Systems: Standard Bathroom Accessibility: Yes Home Equipment: Grab bars - tub/shower, Cane - single point, Agricultural Consultant (2 wheels), Other (comment) (w/c available at his complex per pt)  Functional History: Prior Function Prior Level of Function : Independent/Modified Independent Mobility Comments: Uses SPC at all times, x1 fall in past 6 months, which occurred when pt was going to the bathroom ADLs Comments: Ind Functional Status:  Mobility: Bed Mobility Overal bed mobility: Needs Assistance Bed Mobility: Rolling, Sidelying to Sit, Sit to Sidelying Rolling: Mod assist, +2 for physical assistance, +2 for safety/equipment Sidelying to sit: Mod assist, +2 for safety/equipment, +2 for physical assistance, HOB elevated Sit to sidelying: Mod assist, +2 for physical assistance, +2 for safety/equipment General bed mobility comments: Cues for sequencing, mod assist +2 to roll to side, and scoot to EOB. mod +2 to return to supine to manage BLEs  and trunk Transfers Overall transfer level: Needs assistance Equipment used: Rolling walker (2 wheels) Transfers: Sit to/from Stand Sit to Stand: Min assist, +2 physical assistance, +2 safety/equipment General transfer comment: Cues to rock for momentum min +2 for boost Ambulation/Gait Ambulation/Gait assistance: Min assist, Mod assist, +2 safety/equipment Gait Distance (Feet): 12 Feet Assistive device: Rolling walker (2 wheels) Gait Pattern/deviations: Step-to pattern, Decreased step length - right, Decreased step length - left, Decreased stride length, Shuffle, Decreased weight shift to right, Decreased weight shift to left General Gait Details: Pt initially ambulated with minimal foot advancement, shuffling his feet slowly and unsteadily. Tactile cues provided for weight shifting bil and verbal cues provided to take longer steps with fair success, but continued shuffling noted. MinA for balance the majority of the time, but modA when pt would lean posteriorly when coughing and bracing chest with x1 UE. +2 for safety and line management. Gait velocity: reduced Gait velocity interpretation: <1.31 ft/sec, indicative of household ambulator    ADL: ADL Overall ADL's : Needs assistance/impaired Eating/Feeding: Set up, Sitting Grooming: Set up, Sitting Upper Body Bathing: Minimal assistance, Sitting Lower Body Bathing: Moderate assistance, Sit to/from stand Upper Body Dressing : Minimal assistance, Sitting Lower Body Dressing: Moderate assistance, Sit to/from stand Toilet Transfer: +2 for safety/equipment, Rolling walker (2 wheels), Minimal assistance Functional mobility during ADLs: Moderate assistance, Rolling walker (2 wheels) General ADL Comments: Limited by decreased activity toleramce  Cognition: Cognition Orientation Level: Oriented X4 Cognition Arousal: Alert Behavior During Therapy: WFL for tasks assessed/performed   Review of Systems  Constitutional:  Positive for  malaise/fatigue. Negative for fever.  HENT: Negative.    Eyes: Negative.   Respiratory:  Positive for  shortness of breath.   Cardiovascular:  Positive for leg swelling.  Gastrointestinal: Negative.   Genitourinary: Negative.   Musculoskeletal:  Positive for myalgias and neck pain.  Skin: Negative.   Neurological:  Positive for sensory change, focal weakness and weakness.  Psychiatric/Behavioral:  Negative for substance abuse.    Past Medical History:  Diagnosis Date   CAD (coronary artery disease)    Cancer (HCC)    Diabetes (HCC)    Dyslipidemia    Gout    History of kidney stones    HTN (hypertension)    Myocardial infarction [facility])    Past Surgical History:  Procedure Laterality Date   ANTERIOR CERVICAL DECOMP/DISCECTOMY FUSION Left 10/20/2019   Procedure: Left Cervical Three-Four Cervical Four-Five Cervical Five-Six Anterior cervical decompression/discectomy/fusion;  Surgeon: Cheryle Debby LABOR, MD;  Location: [facility] OR;  Service: Neurosurgery;  Laterality: Left;  Left Cervical Three-Four Cervical Four-Five Cervical Five-Six Anterior cervical decompression/discectomy/fusion   BACK SURGERY     CORONARY ARTERY BYPASS GRAFT N/A 10/21/2024   Procedure: CORONARY ARTERY BYPASS GRAFTING, TIMES 3 USING LEFT INTERNAL MAMMARY ARTERY AND ENDOSCOPICALLY HARVESTED RIGHT SAPHENOUS VEIN;  Surgeon: [name] Dorise POUR, MD;  Location: [facility] OR;  Service: Open Heart Surgery;  Laterality: N/A;   CORONARY PRESSURE/FFR WITH 3D MAPPING N/A 09/20/2024   Procedure: Coronary Pressure/FFR w/3D Mapping;  Surgeon: Mady Bruckner, MD;  Location: [facility] INVASIVE CV LAB;  Service: Cardiovascular;  Laterality: N/A;   INTRAOPERATIVE TRANSESOPHAGEAL ECHOCARDIOGRAM N/A 10/21/2024   Procedure: ECHOCARDIOGRAM, TRANSESOPHAGEAL, INTRAOPERATIVE;  Surgeon: [name] Dorise POUR, MD;  Location: [facility] OR;  Service: Open Heart Surgery;  Laterality: N/A;   LEFT HEART CATH AND CORONARY ANGIOGRAPHY N/A 09/20/2024   Procedure: LEFT HEART CATH AND  CORONARY ANGIOGRAPHY;  Surgeon: Mady Bruckner, MD;  Location: [facility] INVASIVE CV LAB;  Service: Cardiovascular;  Laterality: N/A;   left knee surgery     left shoulder surgery     rotator cuff   POSTERIOR CERVICAL FUSION/FORAMINOTOMY N/A 09/17/2020   Procedure: CERVICAL THREE TO CERVICAL SIX POSTERIOR CERVICAL DECOMPRESSION;  Surgeon: Cheryle Debby LABOR, MD;  Location: [facility] OR;  Service: Neurosurgery;  Laterality: N/A;   right knee surgery     Family History  Problem Relation Age of Onset   Heart attack Father 7   Colon cancer Brother 57   Social History:  reports that he quit smoking about 35 years ago. His smoking use included cigarettes. He started smoking about 71 years ago. He has a 180 pack-year smoking history. He has never been exposed to tobacco smoke. His smokeless tobacco use includes snuff. He reports that he does not drink alcohol and does not use drugs. Allergies: Allergies[1] Medications Prior to Admission  Medication Sig Dispense Refill   allopurinol  (ZYLOPRIM ) 100 MG tablet Take 100 mg by mouth in the morning.     amLODipine  (NORVASC ) 5 MG tablet Take 5 mg by mouth in the morning.     aspirin  EC 81 MG tablet Take 1 tablet (81 mg total) by mouth daily. Swallow whole. 90 tablet 3   atorvastatin  (LIPITOR ) 20 MG tablet Take 1 tablet by mouth in the morning.     Cholecalciferol 50 MCG (2000 UT) TABS Take 1,000 Units by mouth daily.     finasteride  (PROSCAR ) 5 MG tablet Take 5 mg by mouth daily.     fluticasone  (FLONASE ) 50 MCG/ACT nasal spray Place 2 sprays into both nostrils daily. (Patient taking differently: Place 2 sprays into both nostrils daily as needed (Congestion).) 16 g 0  glipiZIDE  (GLUCOTROL ) 5 MG tablet Take 5 mg by mouth in the morning and at bedtime.     isosorbide  mononitrate (IMDUR ) 30 MG 24 hr tablet Take 1 tablet (30 mg total) by mouth daily. 30 tablet 11   meclizine  (ANTIVERT ) 25 MG tablet Take 1 tablet (25 mg total) by mouth 3 (three) times daily as needed  for dizziness. 45 tablet 1   metFORMIN  (GLUCOPHAGE ) 500 MG tablet Take 500 mg by mouth 2 (two) times daily with a meal.     metoprolol  succinate (TOPROL -XL) 50 MG 24 hr tablet Take 1 tablet (50 mg total) by mouth daily. With or immediately following a meal (Patient taking differently: Take 50 mg by mouth at bedtime. With or immediately following a meal) 90 tablet 0   nitroGLYCERIN  (NITROSTAT ) 0.4 MG SL tablet Place 1 tablet (0.4 mg total) under the tongue every 5 (five) minutes as needed for chest pain. 25 tablet PRN   Omega-3 Fatty Acids (FISH OIL PO) Take 2,400 mg by mouth in the morning.     tamsulosin  (FLOMAX ) 0.4 MG CAPS capsule Take 0.4 mg by mouth at bedtime.     vitamin B-12 (CYANOCOBALAMIN ) 1000 MCG tablet Take 1 tablet (1,000 mcg total) by mouth daily.     colchicine  0.6 MG tablet Take 0.6 mg by mouth daily as needed (gout flare).     LIFESCAN FINEPOINT LANCETS MISC Use to check blood sugar 3 time(s) daily       Blood pressure 131/64, pulse 81, temperature 98.1 F (36.7 C), temperature source Oral, resp. rate 20, height 5' 9 (1.753 m), weight 109.1 kg, SpO2 99%. Physical Exam Constitutional:      Appearance: He is obese.  HENT:     Head: Normocephalic.     Right Ear: External ear normal.     Left Ear: External ear normal.     Mouth/Throat:     Pharynx: Oropharynx is clear.  Eyes:     Conjunctiva/sclera: Conjunctivae normal.  Cardiovascular:     Rate and Rhythm: Normal rate.  Pulmonary:     Effort: Pulmonary effort is normal.  Abdominal:     Palpations: Abdomen is soft.  Musculoskeletal:        General: Swelling present.     Right lower leg: Edema present.  Skin:    Comments: Chest and donor incisions CDI  Neurological:     Mental Status: He is alert.     Comments: Alert and oriented x 3. Normal insight and awareness. Intact Memory. Normal language and speech. Cranial nerve exam unremarkable. MMT: RUE 3+ to 4/5 prox to distal. RLE 3+ to 4-/5 prox to distal. Sensory  diminished to light touch RUE and RLE. No abnl resting tone. DTR's tr to 1+.    Psychiatric:        Mood and Affect: Mood normal.        Behavior: Behavior normal.     Results for orders placed or performed during the hospital encounter of 10/21/24 (from the past 24 hours)  Glucose, capillary     Status: Abnormal   Collection Time: 10/24/24 11:33 AM  Result Value Ref Range   Glucose-Capillary 159 (H) 70 - 99 mg/dL  Glucose, capillary     Status: Abnormal   Collection Time: 10/24/24  4:58 PM  Result Value Ref Range   Glucose-Capillary 111 (H) 70 - 99 mg/dL  Glucose, capillary     Status: Abnormal   Collection Time: 10/24/24  9:15 PM  Result Value Ref Range  Glucose-Capillary 141 (H) 70 - 99 mg/dL  CBC     Status: Abnormal   Collection Time: 10/25/24  3:21 AM  Result Value Ref Range   WBC 9.3 4.0 - 10.5 K/uL   RBC 2.95 (L) 4.22 - 5.81 MIL/uL   Hemoglobin 8.7 (L) 13.0 - 17.0 g/dL   HCT 73.3 (L) 60.9 - 47.9 %   MCV 90.2 80.0 - 100.0 fL   MCH 29.5 26.0 - 34.0 pg   MCHC 32.7 30.0 - 36.0 g/dL   RDW 86.4 88.4 - 84.4 %   Platelets 202 150 - 400 K/uL   nRBC 0.2 0.0 - 0.2 %  Basic metabolic panel with GFR     Status: Abnormal   Collection Time: 10/25/24  3:21 AM  Result Value Ref Range   Sodium 134 (L) 135 - 145 mmol/L   Potassium 4.0 3.5 - 5.1 mmol/L   Chloride 102 98 - 111 mmol/L   CO2 25 22 - 32 mmol/L   Glucose, Bld 160 (H) 70 - 99 mg/dL   BUN 22 8 - 23 mg/dL   Creatinine, Ser 9.03 0.61 - 1.24 mg/dL   Calcium  8.2 (L) 8.9 - 10.3 mg/dL   GFR, Estimated >39 >39 mL/min   Anion gap 7 5 - 15  Glucose, capillary     Status: Abnormal   Collection Time: 10/25/24  6:22 AM  Result Value Ref Range   Glucose-Capillary 171 (H) 70 - 99 mg/dL   DG Chest 2 View Result Date: 10/25/2024 EXAM: 2 VIEW(S) XRAY OF THE CHEST 10/25/2024 05:58:00 AM COMPARISON: 10/24/2024 CLINICAL HISTORY: Atelectasis FINDINGS: LINES, TUBES AND DEVICES: Interval removal of right IJ introducer sheath. LUNGS AND  PLEURA: Low lung volumes. Bilateral pleural effusions, left greater than right. Atelectasis at lung bases. Underlying consolidation not excluded. No pneumothorax. HEART AND MEDIASTINUM: Mild cardiomegaly. Aortic atherosclerosis. Sternotomy noted. BONES AND SOFT TISSUES: Lower cervical ACDF hardware noted. No acute osseous abnormality. IMPRESSION: 1. Small bilateral pleural effusions, left greater than right. 2. Low lung volumes with basilar atelectasis; superimposed consolidation cannot be excluded. Electronically signed by: Waddell Calk MD 10/25/2024 08:32 AM EST RP Workstation: [id number]   DG Chest Port 1 View Result Date: 10/24/2024 EXAM: 1 VIEW(S) XRAY OF THE CHEST 10/24/2024 06:17:00 AM COMPARISON: Portable chest yesterday at 6:02 am. CLINICAL HISTORY: 08790 Atelectasis 91209 Atelectasis FINDINGS: LINES, TUBES AND DEVICES: Right IJ catheter introducer sheath remains in place terminating in the upper SVC. LUNGS AND PLEURA: Small pleural effusions are present. Basilar opacities, denser on the left, are noted, which could represent atelectasis or consolidation. The remaining lung fields are clear. No pneumothorax. Overall aeration seems unchanged. HEART AND MEDIASTINUM: CABG changes and mediastinal configuration are stable. The heart is slightly enlarged but no vascular congestion is seen. BONES AND SOFT TISSUES: No acute osseous abnormality. IMPRESSION: 1. Small pleural effusions and basilar opacities denser on the left, possibly atelectasis or consolidation. Stable overall aeration. 2. Slightly enlarged heart without vascular congestion. Electronically signed by: Francis Quam MD 10/24/2024 07:17 AM EST RP Workstation: [id number]    Assessment/Plan: Diagnosis: 78 yo male with hx of right hemiparesis who is now s/p CABG x3 on 12/11 with impaired functional mobility Does the need for close, 24 hr/day medical supervision in concert with the patient's rehab needs make it unreasonable for this patient to  be served in a less intensive setting? Yes Co-Morbidities requiring supervision/potential complications:  -new onset PAF -HTN -poorly controlled diabetes Due to bladder management, bowel management, safety, skin/wound care, disease  management, medication administration, pain management, and patient education, does the patient require 24 hr/day rehab nursing? Yes Does the patient require coordinated care of a physician, rehab nurse, therapy disciplines of PT, OT to address physical and functional deficits in the context of the above medical diagnosis(es)? Yes Addressing deficits in the following areas: balance, endurance, locomotion, strength, transferring, bowel/bladder control, bathing, dressing, feeding, grooming, toileting, and psychosocial support Can the patient actively participate in an intensive therapy program of at least 3 hrs of therapy per day at least 5 days per week? Yes The potential for patient to make measurable gains while on inpatient rehab is excellent Anticipated functional outcomes upon discharge from inpatient rehab are supervision  with PT, supervision and min assist with OT, n/a with SLP. Estimated rehab length of stay to reach the above functional goals is: 7-9 days Anticipated discharge destination: Home Overall Rehab/Functional Prognosis: good  POST ACUTE RECOMMENDATIONS: This patient's condition is appropriate for continued rehabilitative care in the following setting: see below Patient has agreed to participate in recommended program. See below Note that insurance prior authorization may be required for reimbursement for recommended care.  Comment: Pt was with us  on inpatient rehab back in 2021 and had a prolonged experience surrounding his cervical spine/surgery/etc. He prefers not to go back to rehab and thinks that he has enough support pieces in place at home to do fine there. I told him that if he should change his mind to let his team know. We'll follow from a  distance.       I have personally performed a face to face diagnostic evaluation of this patient. Additionally, I have examined the patient's medical record including any pertinent labs and radiographic images.    Thanks,  Arthea ONEIDA Gunther, MD 10/25/2024      [1]  Allergies Allergen Reactions   Simvastatin Other (See Comments)    dizziness

## 2024-10-25 NOTE — Progress Notes (Addendum)
 kg                 [address]           [name] [name] [place], [place] [address]                     [phone]        4 Days Post-Op Procedures (LRB): CORONARY ARTERY BYPASS GRAFTING, TIMES 3 USING LEFT INTERNAL MAMMARY ARTERY AND ENDOSCOPICALLY HARVESTED RIGHT SAPHENOUS VEIN (N/A) ECHOCARDIOGRAM, TRANSESOPHAGEAL, INTRAOPERATIVE (N/A)  Subjective: Patient has not had a bowel movement yet.  Objective: Vital signs in last 24 hours: Temp:  [97.4 F (36.3 C)-98.7 F (37.1 C)] 97.9 F (36.6 C) (12/15 0346) Pulse Rate:  [73-92] 74 (12/15 0346) Cardiac Rhythm: Atrial fibrillation (12/14 2100) Resp:  [10-21] 18 (12/15 0346) BP: (123-152)/(64-127) 140/84 (12/15 0346) SpO2:  [84 %-98 %] 98 % (12/15 0346)  Pre op weight 104.9 kg Current Weight  10/24/24 109.1 kg      Intake/Output from previous day: 12/14 0701 - 12/15 0700 In: 426.7 [P.O.:360; I.V.:66.7] Out: 880 [Urine:880]   Physical Exam:  Cardiovascular: RRR Pulmonary: Diminished bibasilar breath sounds Abdomen: Soft, non tender, bowel sounds present. Extremities: Bilateral lower extremity edema. Wounds: Clean and dry.  No erythema or signs of infection.  Lab Results: CBC: Recent Labs    10/24/24 0429 10/25/24 0321  WBC 9.6 9.3  HGB 8.4* 8.7*  HCT 26.7* 26.6*  PLT 143* 202   BMET:  Recent Labs    10/24/24 0429 10/25/24 0321  NA 133* 134*  K 3.9 4.0  CL 100 102  CO2 26 25  GLUCOSE 167* 160*  BUN 20 22  CREATININE 1.02 0.96  CALCIUM  7.8* 8.2*    PT/INR:  Lab Results  Component Value Date   INR 1.3 (H) 10/21/2024   INR 1.1 10/19/2024   INR 1.0 02/10/2022   ABG:  INR: Will add last result for INR, ABG once components are confirmed Will add last 4 CBG results once components are confirmed  Assessment/Plan:  1. CV - Previous a fib. He had a run of VT last evening and a run of brief a fib. SR since then. On Amiodarone  400 mg bid and Lopressor  25 mg bid. Will add low dose Losartan  for  better BP control.  2.  Pulmonary - on room air. CXR this am appears to show bibasilar atelectasis and small pleural effusions. Encourage incentive spirometer. 3. Above pre op weight, requires further diuresis-continue Lasix  40 mg daily 4.  Expected post op acute blood loss anemia - H and H this am stable at 8.7 and 26.6 5. On Lovenox  for DVT prophylaxis 6. DM-CBGs 111/141/171. On Glipizide  5 mg at breakfast and Metformin  500 mg bid. He is also on Lantus  25 units daily. Will decrease Lantus  and start Glipizide  in the afternoon (took bid prior to surgery) . He will need close medical follow up with PCP after discharge. 7. Mild hyponatremia-sodium 134 8. Disposition-possible discharge in the am. Inpatient rehab already consulted (CIR)  [name] M ZimmermanPA-C 7:02 AM Patient seen and examined, agree with above  [name] C. Kerrin, MD [facility] [phone]

## 2024-10-25 NOTE — Plan of Care (Signed)
  Problem: Education: Goal: Knowledge of General Education information will improve Description: Including pain rating scale, medication(s)/side effects and non-pharmacologic comfort measures Outcome: Progressing   Problem: Health Behavior/Discharge Planning: Goal: Ability to manage health-related needs will improve Outcome: Progressing   Problem: Clinical Measurements: Goal: Ability to maintain clinical measurements within normal limits will improve Outcome: Progressing Goal: Will remain free from infection Outcome: Progressing Goal: Diagnostic test results will improve Outcome: Progressing Goal: Respiratory complications will improve Outcome: Progressing Goal: Cardiovascular complication will be avoided Outcome: Progressing   Problem: Activity: Goal: Risk for activity intolerance will decrease Outcome: Progressing   Problem: Nutrition: Goal: Adequate nutrition will be maintained Outcome: Progressing   Problem: Coping: Goal: Level of anxiety will decrease Outcome: Progressing   Problem: Elimination: Goal: Will not experience complications related to bowel motility Outcome: Progressing Goal: Will not experience complications related to urinary retention Outcome: Progressing   Problem: Pain Managment: Goal: General experience of comfort will improve and/or be controlled Outcome: Progressing   Problem: Safety: Goal: Ability to remain free from injury will improve Outcome: Progressing   Problem: Skin Integrity: Goal: Risk for impaired skin integrity will decrease Outcome: Progressing   Problem: Education: Goal: Will demonstrate proper wound care and an understanding of methods to prevent future damage Outcome: Progressing Goal: Knowledge of disease or condition will improve Outcome: Progressing Goal: Knowledge of the prescribed therapeutic regimen will improve Outcome: Progressing Goal: Individualized Educational Video(s) Outcome: Progressing   Problem:  Activity: Goal: Risk for activity intolerance will decrease Outcome: Progressing   Problem: Cardiac: Goal: Will achieve and/or maintain hemodynamic stability Outcome: Progressing   Problem: Clinical Measurements: Goal: Postoperative complications will be avoided or minimized Outcome: Progressing   Problem: Respiratory: Goal: Respiratory status will improve Outcome: Progressing   Problem: Skin Integrity: Goal: Wound healing without signs and symptoms of infection Outcome: Progressing Goal: Risk for impaired skin integrity will decrease Outcome: Progressing   Problem: Urinary Elimination: Goal: Ability to achieve and maintain adequate renal perfusion and functioning will improve Outcome: Progressing   Problem: Education: Goal: Ability to describe self-care measures that may prevent or decrease complications (Diabetes Survival Skills Education) will improve Outcome: Progressing Goal: Individualized Educational Video(s) Outcome: Progressing   Problem: Coping: Goal: Ability to adjust to condition or change in health will improve Outcome: Progressing   Problem: Fluid Volume: Goal: Ability to maintain a balanced intake and output will improve Outcome: Progressing   Problem: Health Behavior/Discharge Planning: Goal: Ability to identify and utilize available resources and services will improve Outcome: Progressing Goal: Ability to manage health-related needs will improve Outcome: Progressing   Problem: Metabolic: Goal: Ability to maintain appropriate glucose levels will improve Outcome: Progressing   Problem: Nutritional: Goal: Maintenance of adequate nutrition will improve Outcome: Progressing Goal: Progress toward achieving an optimal weight will improve Outcome: Progressing   Problem: Skin Integrity: Goal: Risk for impaired skin integrity will decrease Outcome: Progressing   Problem: Tissue Perfusion: Goal: Adequacy of tissue perfusion will improve Outcome:  Progressing

## 2024-10-25 NOTE — Progress Notes (Signed)
 Pt just finished working with PT. Discussed sternal precautions, wound care, IS use, heart healthy diet, exercise guidelines and CRPII. Pt receptive to education and all questions were answered. Pt states that he will have his daughter and grand kids with him 24 hrs a day if he is discharged home. CRP II referral placed for [facility].   Augustin Sharps, BSRT [phone]

## 2024-10-25 NOTE — Progress Notes (Signed)
 Physical Therapy Treatment Patient Details Name: [name] [name]. MRN: [id number] DOB: [birth year] Today's Date: 10/25/2024   History of Present Illness Pt is a 78 y.o male admitted 12/11 for scheduled CABG x3. PMH: CAD, DM, HTN, MI, cancer, gout    PT Comments  Pt up in chair on arrival, pleasant and agreeable to session and demonstrating good progress towards acute goals. Pt demonstrating transfers and gait with grossly CGA for safety with RW for support. Pt with poor verbal recall for sternal precautions at start of session but demonstrating good adherence throughout mobility. Sternal precautions handout issued and reviewed. Pt continues to be limited in safe mobility by decreased activity tolerance, weakness, impaired balance/postural reactions. Pt continues to benefit from skilled PT services to progress toward functional mobility goals.   HR 88-96bpm SpO2 96-100% on RA BP 178/73   If plan is discharge home, recommend the following: Assistance with cooking/housework;Assist for transportation;A little help with walking and/or transfers;A little help with bathing/dressing/bathroom   Can travel by private vehicle        Equipment Recommendations  BSC/3in1;Hospital bed;Other (comment) (tub bench/shower chair)    Recommendations for Other Services       Precautions / Restrictions Precautions Precautions: Fall;Sternal Precaution/Restrictions Comments: reviewed precautions, watch vitals Restrictions Weight Bearing Restrictions Per Provider Order: No Other Position/Activity Restrictions: sternal precautions     Mobility  Bed Mobility Overal bed mobility: Needs Assistance             General bed mobility comments: pt up in chair on arrival    Transfers Overall transfer level: Needs assistance Equipment used: Rolling walker (2 wheels) Transfers: Sit to/from Stand Sit to Stand: Contact guard assist           General transfer comment: good recall for hand  placement, able to rise without assist, fair control on descent to sitting    Ambulation/Gait Ambulation/Gait assistance: Contact guard assist Gait Distance (Feet): 130 Feet Assistive device: Rolling walker (2 wheels) Gait Pattern/deviations: Step-through pattern Gait velocity: reduced     General Gait Details: CGA for safety, multiple standing rest breaks due to faitgue and DOE 2/4, SpO2 99-100% on RA, HR 88-96bpm   Stairs             Wheelchair Mobility     Tilt Bed    Modified Rankin (Stroke Patients Only)       Balance Overall balance assessment: Needs assistance Sitting-balance support: Feet supported Sitting balance-Leahy Scale: Good     Standing balance support: Bilateral upper extremity supported, Single extremity supported, During functional activity Standing balance-Leahy Scale: Poor Standing balance comment: reliant on RW during dymanic tasks                            Communication Communication Communication: No apparent difficulties  Cognition Arousal: Alert Behavior During Therapy: WFL for tasks assessed/performed                             Following commands: Intact      Cueing Cueing Techniques: Verbal cues, Tactile cues  Exercises      General Comments General comments (skin integrity, edema, etc.): VSS on RA      Pertinent Vitals/Pain Pain Assessment Pain Assessment: No/denies pain    Home Living  Prior Function            PT Goals (current goals can now be found in the care plan section) Acute Rehab PT Goals Patient Stated Goal: to improve PT Goal Formulation: With patient Time For Goal Achievement: 11/05/24 Progress towards PT goals: Progressing toward goals    Frequency    Min 3X/week      PT Plan      Co-evaluation              AM-PAC PT 6 Clicks Mobility   Outcome Measure  Help needed turning from your back to your side while in a flat  bed without using bedrails?: A Little Help needed moving from lying on your back to sitting on the side of a flat bed without using bedrails?: A Little Help needed moving to and from a bed to a chair (including a wheelchair)?: A Little Help needed standing up from a chair using your arms (e.g., wheelchair or bedside chair)?: A Little Help needed to walk in hospital room?: A Little Help needed climbing 3-5 steps with a railing? : Total 6 Click Score: 16    End of Session   Activity Tolerance: Patient tolerated treatment well Patient left: with call bell/phone within reach;in chair;Other (comment) (with cardiac rehab present) Nurse Communication: Mobility status PT Visit Diagnosis: Unsteadiness on feet (R26.81);Other abnormalities of gait and mobility (R26.89);Muscle weakness (generalized) (M62.81);Difficulty in walking, not elsewhere classified (R26.2);Pain Pain - Right/Left:  (sternum) Pain - part of body:  (sternum)     Time: 9056-8993 PT Time Calculation (min) (ACUTE ONLY): 23 min  Charges:    $Gait Training: 8-22 mins $Therapeutic Activity: 8-22 mins PT General Charges $$ ACUTE PT VISIT: 1 Visit                     [name] [name] R. PTA Acute Rehabilitation Services Office: [phone]   [name] [name] 10/25/2024, 10:14 AM

## 2024-10-25 NOTE — Plan of Care (Signed)

## 2024-10-25 NOTE — Progress Notes (Signed)
 Occupational Therapy Treatment Patient Details Name: [name] [name]. MRN: [id number] DOB: 1946-03-30 Today's Date: 10/25/2024   History of present illness Pt is a 78 y.o male admitted for CABG x3 10/21/24. PMH: CAD, DM, HTN, MI, cancer, gout, ACDF 2020.   OT comments  Pt making steady progress toward goals. Able to verbalize precautions for mobility however requires cuing for understanding of sternal precautions for ADL tasks. Pt overall mod A for ADL tasks due to below listed deficits. Feel pt can reach modified independent level goals with intensive inpatient follow-up therapy, >3 hours/day. Pt is very motivated to become more independent and return home with support of family if needed. Acute OT to follow.       If plan is discharge home, recommend the following:  A lot of help with walking and/or transfers;A lot of help with bathing/dressing/bathroom;Assistance with cooking/housework;Assist for transportation   Equipment Recommendations  BSC/3in1    Recommendations for Other Services Rehab consult    Precautions / Restrictions Precautions Precautions: Fall;Sternal Precaution Booklet Issued: Yes (comment) Precaution/Restrictions Comments: reviewed precautions, watch vitals Restrictions Weight Bearing Restrictions Per Provider Order: Yes Other Position/Activity Restrictions: sternal precautions       Mobility Bed Mobility               General bed mobility comments: pt up in chair on arrival    Transfers Overall transfer level: Needs assistance Equipment used: Rolling walker (2 wheels) Transfers: Sit to/from Stand Sit to Stand: Contact guard assist           General transfer comment: able to rise without assist, fair control on descent to sitting     Balance Overall balance assessment: Needs assistance Sitting-balance support: Feet supported Sitting balance-Leahy Scale: Good     Standing balance support: Bilateral upper extremity supported, Single  extremity supported, During functional activity Standing balance-Leahy Scale: Poor Standing balance comment: reliant on RW during dymanic tasks                           ADL either performed or assessed with clinical judgement   ADL Overall ADL's : Needs assistance/impaired Eating/Feeding: Independent   Grooming: Set up;Sitting   Upper Body Bathing: Set up;Sitting;Supervision/ safety   Lower Body Bathing: Moderate assistance;Sit to/from stand   Upper Body Dressing : Moderate assistance;Sitting   Lower Body Dressing: Moderate assistance;Sit to/from stand; socks/shoes difficult at baseline however pt reports increased discomfort at attempts to donn/doff socks - may benefit from use of AE   Toilet Transfer: Contact guard assist;Ambulation   Toileting- Clothing Manipulation and Hygiene: Moderate assistance       Functional mobility during ADLs: Contact guard assist @ RW level; began education on energy conservation for ADL and moving  in the tube to follow precautions      Extremity/Trunk Assessment Upper Extremity Assessment Upper Extremity Assessment: RUE deficits/detail RUE Deficits / Details: Hx of R sided weakness, decreased grip/pinch strength and light touch from previous neck surgery RUE Sensation: decreased light touch RUE Coordination: decreased fine motor   Lower Extremity Assessment Lower Extremity Assessment: Defer to PT evaluation        Vision       Perception     Praxis     Communication Communication Communication: No apparent difficulties   Cognition Arousal: Alert Behavior During Therapy: WFL for tasks assessed/performed Cognition: No apparent impairments (scored WFL on the Short Blessed Test)  Following commands: Intact        Cueing   Cueing Techniques: Verbal cues, Tactile cues  Exercises      Shoulder Instructions       General Comments VSS on RA    Pertinent Vitals/  Pain       Pain Assessment Pain Assessment: Faces Faces Pain Scale: Hurts a little bit Pain Location: sternum (with attemtps at bending over) Pain Descriptors / Indicators: Discomfort, Grimacing, Guarding, Operative site guarding, Sore Pain Intervention(s): Limited activity within patient's tolerance  Home Living                                          Prior Functioning/Environment              Frequency  Min 2X/week        Progress Toward Goals  OT Goals(current goals can now be found in the care plan section)  Progress towards OT goals: Progressing toward goals  Acute Rehab OT Goals Patient Stated Goal: get better and go home OT Goal Formulation: With patient Time For Goal Achievement: 11/05/24 Potential to Achieve Goals: Good ADL Goals Pt Will Perform Upper Body Dressing: with supervision;sitting Pt Will Perform Lower Body Dressing: with supervision;sit to/from stand Pt Will Transfer to Toilet: with supervision;ambulating;regular height toilet Pt Will Perform Toileting - Clothing Manipulation and hygiene: with supervision;sit to/from stand;sitting/lateral leans Additional ADL Goal #1: Pt will verbalize at least 3 energy conservation strategies for ADLs  Plan      Co-evaluation                 AM-PAC OT 6 Clicks Daily Activity     Outcome Measure   Help from another person eating meals?: None Help from another person taking care of personal grooming?: A Little Help from another person toileting, which includes using toliet, bedpan, or urinal?: A Lot Help from another person bathing (including washing, rinsing, drying)?: A Lot Help from another person to put on and taking off regular upper body clothing?: A Lot Help from another person to put on and taking off regular lower body clothing?: A Lot 6 Click Score: 15    End of Session Equipment Utilized During Treatment: Gait belt;Rolling walker (2 wheels)  OT Visit Diagnosis:  Unsteadiness on feet (R26.81);Other abnormalities of gait and mobility (R26.89);Muscle weakness (generalized) (M62.81)   Activity Tolerance Patient tolerated treatment well   Patient Left in chair;with call bell/phone within reach;with chair alarm set   Nurse Communication Mobility status        Time: 1115 (8884)-8862 OT Time Calculation (min): 22 min  Charges: OT General Charges $OT Visit: 1 Visit OT Treatments $Self Care/Home Management : 8-22 mins  Kreg Sink, OT/L   Acute OT Clinical Specialist Acute Rehabilitation Services Pager [phone] Office [phone]   [facility] 10/25/2024, 11:46 AM

## 2024-10-25 NOTE — Progress Notes (Signed)
 Inpatient Rehab Admissions Coordinator:  Insurance authorization started. Pt's daughter Hildreth updated. Will continue to follow.   Tinnie Yvone Cohens, MS, CCC-SLP Admissions Coordinator [phone]

## 2024-10-26 LAB — GLUCOSE, CAPILLARY
Glucose-Capillary: 111 mg/dL — ABNORMAL HIGH (ref 70–99)
Glucose-Capillary: 194 mg/dL — ABNORMAL HIGH (ref 70–99)
Glucose-Capillary: 153 mg/dL — ABNORMAL HIGH (ref 70–99)
Glucose-Capillary: 118 mg/dL — ABNORMAL HIGH (ref 70–99)
Glucose-Capillary: 145 mg/dL — ABNORMAL HIGH (ref 70–99)

## 2024-10-26 MED ORDER — FUROSEMIDE 40 MG PO TABS: 40.0000 mg | ORAL_TABLET | Freq: Every day | ORAL | Status: DC

## 2024-10-26 MED ORDER — POTASSIUM CHLORIDE CRYS ER 20 MEQ PO TBCR
40.0000 meq | EXTENDED_RELEASE_TABLET | Freq: Every day | ORAL | Status: DC
  Administered 2024-10-26 – 2024-10-27 (×2): 40 meq via ORAL
  Filled 2024-10-26 (×2): qty 2

## 2024-10-26 MED ORDER — TORSEMIDE 20 MG PO TABS
40.0000 mg | ORAL_TABLET | Freq: Once | ORAL | Status: AC
  Administered 2024-10-26: 09:00:00 40 mg via ORAL
  Filled 2024-10-26: qty 2

## 2024-10-26 MED ORDER — FUROSEMIDE 10 MG/ML IJ SOLN: 40.0000 mg | Freq: Once | INTRAMUSCULAR | Status: DC

## 2024-10-26 NOTE — Plan of Care (Signed)
 ?  Problem: Clinical Measurements: ?Goal: Will remain free from infection ?Outcome: Progressing ?  ?

## 2024-10-26 NOTE — Progress Notes (Signed)
 Mobility Specialist Progress Note;   10/26/24 0919  Mobility  Activity Ambulated with assistance  Level of Assistance Standby assist, set-up cues, supervision of patient - no hands on  Assistive Device Front wheel walker  Distance Ambulated (ft) 120 ft  RUE Weight Bearing Per Provider Order NWB  LUE Weight Bearing Per Provider Order NWB  Activity Response Tolerated well  Mobility Referral Yes  Mobility visit 1 Mobility  Mobility Specialist Start Time (ACUTE ONLY) 0919  Mobility Specialist Stop Time (ACUTE ONLY) [id number]  Mobility Specialist Time Calculation (min) (ACUTE ONLY) 19 min   Pt in chair upon arrival, eager for mobility. Able to follow sternal precautions well. Required no physical assistance during ambulation, SV for safety. Pt took frequent standing rest breaks d/t fatigue, however VSS throughout. Pt returned back to chair and left with all needs met, call bell in reach.   Lauraine Erm Mobility Specialist Please contact via SecureChat or [employer] [phone]

## 2024-10-26 NOTE — Progress Notes (Addendum)
 kg                 [address]           [name] [name] [place], [place] [address]                     [phone]        5 Days Post-Op Procedures (LRB): CORONARY ARTERY BYPASS GRAFTING, TIMES 3 USING LEFT INTERNAL MAMMARY ARTERY AND ENDOSCOPICALLY HARVESTED RIGHT SAPHENOUS VEIN (N/A) ECHOCARDIOGRAM, TRANSESOPHAGEAL, INTRAOPERATIVE (N/A)  Subjective: Patient had a bowel movement. He has complaints of back pain (from lying in bed) and wants to go to the chair.  Objective: Vital signs in last 24 hours: Temp:  [97.6 F (36.4 C)-98.2 F (36.8 C)] 97.6 F (36.4 C) (12/15 2354) Pulse Rate:  [72-81] 76 (12/16 0439) Cardiac Rhythm: Normal sinus rhythm (12/15 2010) Resp:  [14-20] 14 (12/16 0439) BP: (129-151)/(59-77) 136/77 (12/16 0439) SpO2:  [99 %-100 %] 99 % (12/16 0439) Weight:  [888 kg] 111 kg (12/16 0624)  Pre op weight 104.9 kg Current Weight  10/26/24 111 kg      Intake/Output from previous day: No intake/output data recorded.   Physical Exam:  Cardiovascular: RRR Pulmonary: Diminished bibasilar breath sounds Abdomen: Soft, non tender, bowel sounds present. Extremities: ++ bilateral lower extremity edema. Wounds: Clean and dry.  No erythema or signs of infection.  Lab Results: CBC: Recent Labs    10/24/24 0429 10/25/24 0321  WBC 9.6 9.3  HGB 8.4* 8.7*  HCT 26.7* 26.6*  PLT 143* 202   BMET:  Recent Labs    10/24/24 0429 10/25/24 0321  NA 133* 134*  K 3.9 4.0  CL 100 102  CO2 26 25  GLUCOSE 167* 160*  BUN 20 22  CREATININE 1.02 0.96  CALCIUM  7.8* 8.2*    PT/INR:  Lab Results  Component Value Date   INR 1.3 (H) 10/21/2024   INR 1.1 10/19/2024   INR 1.0 02/10/2022   ABG:  INR: Will add last result for INR, ABG once components are confirmed Will add last 4 CBG results once components are confirmed  Assessment/Plan:  1. CV - Previous a fib. SR for over 24 hours. On Amiodarone  400 mg bid, Lopressor  25 mg bid, and low dose  Losartan . 2.  Pulmonary - on room air. Encourage incentive spirometer. 3. Above pre op weight, requires further diuresis-will  stop Lasix  and try Demadex  to better diurese 4.  Expected post op acute blood loss anemia - Last H and H stable at 8.7 and 26.6 5. On Lovenox  for DVT prophylaxis 6. DM-CBGs 190/128/111. On Glipizide  5 mg bid and Metformin  500 mg bid. He is also on Lantus  25 units daily.  He will need close medical follow up with PCP after discharge. 7. Disposition- Inpatient rehab already consulted (CIR) and awaiting insurance authorization. Would like to see how he diurese's today and could go in am  Donielle M ZimmermanPA-C 6:59 AM  Patient seen and examined, agree with above  [name], MD [facility] [phone]

## 2024-10-26 NOTE — Care Management Important Message (Signed)
 Important Message  Patient Details  Name: [name] [name]. MRN: [id number] Date of Birth: [birth year]   Important Message Given:  Yes - Medicare IM     Vonzell Arrie Sharps 10/26/2024, 10:48 AM

## 2024-10-26 NOTE — Plan of Care (Signed)
   Problem: Education: Goal: Knowledge of General Education information will improve Description Including pain rating scale, medication(s)/side effects and non-pharmacologic comfort measures Outcome: Progressing   Problem: Health Behavior/Discharge Planning: Goal: Ability to manage health-related needs will improve Outcome: Progressing

## 2024-10-26 NOTE — Progress Notes (Cosign Needed Addendum)
 Physical Therapy Treatment Patient Details Name: [name] [name]. MRN: [id number] DOB: January 14, 1946 Today's Date: 10/26/2024   History of Present Illness Pt is a 78 y.o male admitted for CABG x3 10/21/24. PMH: CAD, DM, HTN, MI, cancer, gout, ACDF 2020.    PT Comments  Pt up in chair on arrival, daughter Hildreth present at bedside and very supportive throughout session. Pt continues to demonstrate steady progress towards acute goals and agreeable to session this date. Pt demonstrating transfers sit<>stand and gait with RW for support for hallway distance with CGA for safety with no LOB noted. Pt taking standing rest breaks PRN due to fatigue with noted DOE 2/4 with SpO2 99-100% on RA. Pt with good adherence to sternal precautions throughout session and sternal precautions handout reviewed again this date with pt and pt daughter. Pt was educated on continued walker use to maximize functional independence, safety, and decrease risk for falls as well as importance of frequent mobilization, appropriate activity progression, all precautions. Discussed with supervising PT and updated recommendation as pt making great progress and with good family/social support post acutely. Pt continues to benefit from skilled PT services to progress toward functional mobility goals.     If plan is discharge home, recommend the following: Assistance with cooking/housework;Assist for transportation;A little help with walking and/or transfers;A little help with bathing/dressing/bathroom   Can travel by private vehicle        Equipment Recommendations  BSC/3in1;Rolling walker (2 wheels) (tub bench/shower chair)    Recommendations for Other Services       Precautions / Restrictions Precautions Precautions: Fall;Sternal Precaution Booklet Issued: Yes (comment) Precaution/Restrictions Comments: reviewed precautions, watch vitals Restrictions Weight Bearing Restrictions Per Provider Order: Yes Other Position/Activity  Restrictions: sternal precautions     Mobility  Bed Mobility Overal bed mobility: Needs Assistance             General bed mobility comments: pt up in chair on arrival    Transfers Overall transfer level: Needs assistance Equipment used: Rolling walker (2 wheels) Transfers: Sit to/from Stand Sit to Stand: Contact guard assist           General transfer comment: able to rise without assist, fair control on descent to sitting    Ambulation/Gait Ambulation/Gait assistance: Contact guard assist Gait Distance (Feet): 165 Feet Assistive device: Rolling walker (2 wheels) Gait Pattern/deviations: Step-through pattern Gait velocity: reduced     General Gait Details: CGA for safety, multiple standing rest breaks due to faitgue and DOE 2/4, SpO2 99-100% on RA, HR up to 88bpm   Stairs             Wheelchair Mobility     Tilt Bed    Modified Rankin (Stroke Patients Only)       Balance Overall balance assessment: Needs assistance Sitting-balance support: Feet supported Sitting balance-Leahy Scale: Good Sitting balance - Comments: Static sitting EOB with CGA-minA for balance   Standing balance support: Bilateral upper extremity supported, Single extremity supported, During functional activity Standing balance-Leahy Scale: Poor Standing balance comment: reliant on RW during dymanic tasks                            Communication Communication Communication: No apparent difficulties  Cognition Arousal: Alert Behavior During Therapy: [facility] for tasks assessed/performed                             Following  commands: Intact      Cueing Cueing Techniques: Verbal cues, Tactile cues  Exercises      General Comments General comments (skin integrity, edema, etc.): VSS on RA, daughter Joni persent and supportive      Pertinent Vitals/Pain Pain Assessment Pain Assessment: Faces Faces Pain Scale: Hurts a little bit Pain Location:  sternum (with attemtps at bending over) Pain Descriptors / Indicators: Discomfort, Grimacing, Guarding, Operative site guarding, Sore Pain Intervention(s): Monitored during session, Limited activity within patient's tolerance    Home Living                          Prior Function            PT Goals (current goals can now be found in the care plan section) Acute Rehab PT Goals Patient Stated Goal: to improve PT Goal Formulation: With patient Time For Goal Achievement: 11/05/24 Progress towards PT goals: Progressing toward goals    Frequency    Min 3X/week      PT Plan      Co-evaluation              AM-PAC PT 6 Clicks Mobility   Outcome Measure  Help needed turning from your back to your side while in a flat bed without using bedrails?: A Little Help needed moving from lying on your back to sitting on the side of a flat bed without using bedrails?: A Little Help needed moving to and from a bed to a chair (including a wheelchair)?: A Little Help needed standing up from a chair using your arms (e.g., wheelchair or bedside chair)?: A Little Help needed to walk in hospital room?: A Little Help needed climbing 3-5 steps with a railing? : Total 6 Click Score: 16    End of Session   Activity Tolerance: Patient tolerated treatment well Patient left: with call bell/phone within reach;in chair;with family/visitor present Nurse Communication: Mobility status PT Visit Diagnosis: Unsteadiness on feet (R26.81);Other abnormalities of gait and mobility (R26.89);Muscle weakness (generalized) (M62.81);Difficulty in walking, not elsewhere classified (R26.2);Pain Pain - Right/Left:  (sternum) Pain - part of body:  (sternum)     Time: 8594-8560 PT Time Calculation (min) (ACUTE ONLY): 34 min  Charges:    $Gait Training: 8-22 mins $Therapeutic Activity: 8-22 mins PT General Charges $$ ACUTE PT VISIT: 1 Visit                     Zsofia Prout R. PTA Acute  Rehabilitation Services Office: [phone]   Therisa CHRISTELLA Boor 10/26/2024, 3:06 PM

## 2024-10-26 NOTE — Progress Notes (Addendum)
° °  Inpatient Rehabilitation Admissions Coordinator   I met with patient at bedside and then contacted his daughter, Joni, by phone. I reviewed estimated cost of CIR if approved by [facility]. I recommended she meet with therapy to see how he is doing, before determining to take him directly home with family providing 24/7 care. I have asked Therisa PTA to arrange time this afternoon to meet with daughter at bedside.  Heron Leavell, RN, MSN Rehab Admissions Coordinator [phone] 10/26/2024 11:23 AM    Therapy and daughter to meet at 2 pm to assess his functional level and rehab needs with daughter.  Heron Leavell, RN, MSN Rehab Admissions Coordinator [phone] 10/26/2024 11:33 AM

## 2024-10-26 NOTE — Progress Notes (Signed)
° °  Inpatient Rehabilitation Admissions Coordinator   Patient has progressed to CGA with RW. Daughter met with therapy and family can provide 24/7 supervision at this level. We will not pursue CIR and will sign off. TOC made aware.  Heron Leavell, RN, MSN Rehab Admissions Coordinator [phone] 10/26/2024 3:08 PM

## 2024-10-27 ENCOUNTER — Other Ambulatory Visit (HOSPITAL_COMMUNITY): Payer: Self-pay

## 2024-10-27 LAB — BASIC METABOLIC PANEL WITH GFR
Anion gap: 12 (ref 5–15)
BUN: 23 mg/dL (ref 8–23)
CO2: 23 mmol/L (ref 22–32)
Calcium: 8.6 mg/dL — ABNORMAL LOW (ref 8.9–10.3)
Chloride: 102 mmol/L (ref 98–111)
Creatinine, Ser: 0.93 mg/dL (ref 0.61–1.24)
GFR, Estimated: >60 mL/min (ref >60)
Glucose, Bld: 130 mg/dL — ABNORMAL HIGH (ref 70–99)
Potassium: 4 mmol/L (ref 3.5–5.1)
Sodium: 137 mmol/L (ref 135–145)

## 2024-10-27 LAB — GLUCOSE, CAPILLARY
Glucose-Capillary: 120 mg/dL — ABNORMAL HIGH (ref 70–99)
Glucose-Capillary: 142 mg/dL — ABNORMAL HIGH (ref 70–99)

## 2024-10-27 MED ORDER — METOPROLOL TARTRATE 25 MG PO TABS
25.0000 mg | ORAL_TABLET | Freq: Two times a day (BID) | ORAL | 1 refills | Status: DC
  Filled 2024-10-27: qty 60, 30d supply, fill #0

## 2024-10-27 MED ORDER — AMIODARONE HCL 200 MG PO TABS
ORAL_TABLET | ORAL | 2 refills | Status: DC
  Filled 2024-10-27: qty 30, 23d supply, fill #0

## 2024-10-27 MED ORDER — TORSEMIDE 20 MG PO TABS
40.0000 mg | ORAL_TABLET | Freq: Every day | ORAL | Status: DC
  Administered 2024-10-27: 09:00:00 40 mg via ORAL
  Filled 2024-10-27: qty 2

## 2024-10-27 MED ORDER — POTASSIUM CHLORIDE CRYS ER 20 MEQ PO TBCR
20.0000 meq | EXTENDED_RELEASE_TABLET | Freq: Every day | ORAL | 0 refills | Status: DC
  Filled 2024-10-27: qty 10, 10d supply, fill #0

## 2024-10-27 MED ORDER — ASPIRIN 325 MG PO TBEC: 325.0000 mg | DELAYED_RELEASE_TABLET | Freq: Every day | ORAL | Status: AC

## 2024-10-27 MED ORDER — FLUTICASONE PROPIONATE 50 MCG/ACT NA SUSP: 2.0000 | Freq: Every day | NASAL | Status: AC | PRN

## 2024-10-27 MED ORDER — OXYCODONE HCL 10 MG PO TABS
10.0000 mg | ORAL_TABLET | Freq: Four times a day (QID) | ORAL | 0 refills | Status: AC | PRN
  Filled 2024-10-27: qty 30, 8d supply, fill #0

## 2024-10-27 MED ORDER — LOSARTAN POTASSIUM 25 MG PO TABS
25.0000 mg | ORAL_TABLET | Freq: Every day | ORAL | 1 refills | Status: AC
  Filled 2024-10-27: qty 30, 30d supply, fill #0

## 2024-10-27 MED ORDER — ATORVASTATIN CALCIUM 40 MG PO TABS
40.0000 mg | ORAL_TABLET | Freq: Every morning | ORAL | 2 refills | Status: DC
  Filled 2024-10-27: qty 30, 30d supply, fill #0

## 2024-10-27 MED ORDER — TORSEMIDE 20 MG PO TABS
20.0000 mg | ORAL_TABLET | Freq: Every day | ORAL | 0 refills | Status: DC
  Filled 2024-10-27: qty 10, 10d supply, fill #0

## 2024-10-27 NOTE — Progress Notes (Signed)
 1 Patient ambulating o2 90% Room air 2 Patient maintianed o2 sats greater than 90% room air the entire time while ambulating.  Tolerated fair

## 2024-10-27 NOTE — Progress Notes (Signed)
 Occupational Therapy Treatment Patient Details Name: [name] [name]. MRN: [id number] DOB: 07/15/1946 Today's Date: 10/27/2024   History of present illness Pt is a 78 y.o male admitted for CABG x3 10/21/24. PMH: CAD, DM, HTN, MI, cancer, gout, ACDF 2020.   OT comments  Pt progressing well towards goals. Session focused on maintaining sternal precautions with ADLs. Pt completed UB/LB dressing with min assist for socks and cueing for use of compensatory strategies. Reviewed energy conservation strategies with pt; pt able to verbalize understanding. Pt continues to be limited by decreased activity tolerance. Updated pt's d/c recs to HHOT. Will continue to follow acutely.       If plan is discharge home, recommend the following:  Assistance with cooking/housework;Assist for transportation;A little help with bathing/dressing/bathroom;A little help with walking and/or transfers   Equipment Recommendations  BSC/3in1       Precautions / Restrictions Precautions Precautions: Fall;Sternal Precaution Booklet Issued: Yes (comment) Recall of Precautions/Restrictions: Intact Precaution/Restrictions Comments: reviewed precautions, watch vitals Restrictions Weight Bearing Restrictions Per Provider Order: No Other Position/Activity Restrictions: sternal precautions       Mobility Bed Mobility     General bed mobility comments: received in recliner    Transfers Overall transfer level: Needs assistance Equipment used: Rolling walker (2 wheels) Transfers: Sit to/from Stand Sit to Stand: Supervision           General transfer comment: S for safety, good use of rocking technique     Balance Overall balance assessment: Needs assistance Sitting-balance support: Feet supported Sitting balance-Leahy Scale: Good     Standing balance support: Bilateral upper extremity supported, Single extremity supported, During functional activity Standing balance-Leahy Scale: Fair Standing balance  comment: benefits from RW       ADL either performed or assessed with clinical judgement   ADL Overall ADL's : Needs assistance/impaired     Grooming: Set up;Sitting;Brushing hair           Upper Body Dressing : Supervision/safety;Sitting   Lower Body Dressing: Minimal assistance;Sit to/from stand Lower Body Dressing Details (indicate cue type and reason): assist for socks Toilet Transfer: Supervision/safety;Rolling walker (2 wheels)           Functional mobility during ADLs: Supervision/safety General ADL Comments: Supervision for use of compensatory strategies with sternal precautions    Extremity/Trunk Assessment Upper Extremity Assessment Upper Extremity Assessment: RUE deficits/detail RUE Deficits / Details: Hx of R sided weakness, decreased grip/pinch strength and light touch from previous neck surgery RUE Sensation: decreased light touch RUE Coordination: decreased fine motor   Lower Extremity Assessment Lower Extremity Assessment: Defer to PT evaluation        Vision   Vision Assessment?: No apparent visual deficits         Communication Communication Communication: No apparent difficulties   Cognition Arousal: Alert Behavior During Therapy: WFL for tasks assessed/performed Cognition: No apparent impairments       Following commands: Intact        Cueing   Cueing Techniques: Verbal cues, Tactile cues        General Comments VSS on RA    Pertinent Vitals/ Pain       Pain Assessment Pain Assessment: No/denies pain Pain Intervention(s): Monitored during session   Frequency  Min 2X/week        Progress Toward Goals  OT Goals(current goals can now be found in the care plan section)  Progress towards OT goals: Progressing toward goals      AM-PAC OT 6 Clicks Daily  Activity     Outcome Measure   Help from another person eating meals?: None Help from another person taking care of personal grooming?: A Little Help from another  person toileting, which includes using toliet, bedpan, or urinal?: A Little Help from another person bathing (including washing, rinsing, drying)?: A Little Help from another person to put on and taking off regular upper body clothing?: A Little Help from another person to put on and taking off regular lower body clothing?: A Little 6 Click Score: 19    End of Session    OT Visit Diagnosis: Unsteadiness on feet (R26.81);Other abnormalities of gait and mobility (R26.89);Muscle weakness (generalized) (M62.81)   Activity Tolerance Patient tolerated treatment well   Patient Left in chair;with call bell/phone within reach   Nurse Communication Mobility status        Time: 8767-8747 OT Time Calculation (min): 20 min  Charges: OT General Charges $OT Visit: 1 Visit OT Treatments $Self Care/Home Management : 8-22 mins  Adrianne BROCKS, OT  Acute Rehabilitation Services Office [phone] Secure chat preferred   Adrianne GORMAN Savers 10/27/2024, 1:36 PM

## 2024-10-27 NOTE — Progress Notes (Signed)
 Reviewed with pt IS, sternal precautions, diet, exercise, and CRPII. Pt voiced understanding. Referral sent.  [id number] Aliene Aris BS, ACSM-CEP 10/27/2024 12:12 PM

## 2024-10-27 NOTE — Progress Notes (Addendum)
° °   °              [address]           Thurmon [name] [place], [place] [address]                     [phone]        6 Days Post-Op Procedures (LRB): CORONARY ARTERY BYPASS GRAFTING, TIMES 3 USING LEFT INTERNAL MAMMARY ARTERY AND ENDOSCOPICALLY HARVESTED RIGHT SAPHENOUS VEIN (N/A) ECHOCARDIOGRAM, TRANSESOPHAGEAL, INTRAOPERATIVE (N/A)  Subjective: Patient states  my weight did not change much but I am still peeing a lot.  Objective: Vital signs in last 24 hours: Temp:  [97.7 F (36.5 C)-98.3 F (36.8 C)] 97.7 F (36.5 C) (12/17 0404) Pulse Rate:  [68-89] 76 (12/17 0404) Cardiac Rhythm: Normal sinus rhythm (12/16 2000) Resp:  [13-19] 14 (12/17 0404) BP: (109-138)/(58-75) 138/67 (12/17 0404) SpO2:  [98 %-100 %] 100 % (12/17 0404) Weight:  [110.8 kg] 110.8 kg (12/17 0404)  Pre op weight 104.9 kg Current Weight  10/27/24 110.8 kg      Intake/Output from previous day: 12/16 0701 - 12/17 0700 In: 1.7 [I.V.:1.7] Out: 1600 [Urine:1600]   Physical Exam:  Cardiovascular: RRR Pulmonary: Clear Abdomen: Soft, non tender, bowel sounds present. Extremities:Bilateral lower extremity edema but appears to slowly be decreasing with change to Demadex  Wounds: Clean and dry.  No erythema or signs of infection.  Lab Results: CBC: Recent Labs    10/25/24 0321  WBC 9.3  HGB 8.7*  HCT 26.6*  PLT 202   BMET:  Recent Labs    10/25/24 0321 10/27/24 0403  NA 134* 137  K 4.0 4.0  CL 102 102  CO2 25 23  GLUCOSE 160* 130*  BUN 22 23  CREATININE 0.96 0.93  CALCIUM  8.2* 8.6*    PT/INR:  Lab Results  Component Value Date   INR 1.3 (H) 10/21/2024   INR 1.1 10/19/2024   INR 1.0 02/10/2022   ABG:  INR: Will add last result for INR, ABG once components are confirmed Will add last 4 CBG results once components are confirmed  Assessment/Plan: 1. CV - Previous a fib. Maintaining SR for last 2 days. On Amiodarone  400 mg bid, Lopressor  25 mg bid, and Losartan  25 mg  daily. 2.  Pulmonary - on room air. Encourage incentive spirometer. 3. Above pre op weight, requires further diuresis-continue Demadex  40 today and decrease to 20 mg daily at discharge 4.  Expected post op acute blood loss anemia - Last H and H stable at 8.7 and 26.6 5. On Lovenox  for DVT prophylaxis 6. DM-CBGs 118/145/120. On Glipizide  5 mg bid and Metformin  500 mg bid. He is also on Lantus  20 units daily.  He will need close medical follow up with PCP after discharge. 7. Disposition- Per inpatient rehab note yesterday, family can provide care at his current physical level and does not want to pursue CIR anymore. All equipment recommended by PT/OT ordered as well as home PT. As discussed  with Dr. Kerrin, will discharge later today. Chest tube sutures will be removed at office appointment next week  Marquez Ceesay M ZimmermanPA-C 6:52 AM

## 2024-10-27 NOTE — TOC Transition Note (Addendum)
 Transition of Care [facility]) - Discharge Note Rayfield Gobble RN, BSN Inpatient Care Management Unit 4E- RN Case Manager See Treatment Team for direct phone #   Patient Details  Name: [name] [name]. MRN: [id number] Date of Birth: Nov 09, 1946  Transition of Care [facility]) CM/SW Contact:  Gobble Rayfield Hurst, RN Phone Number: 10/27/2024, 11:12 AM   Clinical Narrative:    Orders in for [facility] and DME needs- pt will likely discharge later today.   CM in to speak with pt at bedside- discussed plan to return home w/ HH. List provided for [facility] choice Per CMS guidelines from [email or web address] website with star ratings (copy placed in shadow chart)- pt voiced he does not have a preference- CM noted to pt which agencies contract will with his insurance- pt elected [facility].   DME needs discussed- explained insurance does not cover tub benches- pt voiced he would have his daughter get one, pt does want RW and BSC- explained insurance coverage for DME of 80% with 20% copay. Pt voiced understanding and states no preference in DME provider.   Address, phone # and PCP all confirmed, daughter to transport home this afternoon.   Referral for DME needs sent to [facility] via [employer] and liaison Ryan, referral has been accepted and DME to be delivered to pt prior to discharge.   Referral for [facility] sent to [facility] via [employer] and liaison Burnard- referral has been accepted.   IP CM interventions have been completed no further needs noted.  1300- notified by discharge RN that pt now declining DME due to cost- call made to daughter Joni to confirm pt will have needed DME available- per Joni- she has both a RW and BSC that pt can use- she voiced that pt was worried about cost- DME coverage explained to daughter who voiced understanding. Per Joni- they will not need DME as she has it covered and has also ordered a tub bench as well.  [facility] liaison notified to cancel DME request.    Final next level of care: Home  w Home Health Services Barriers to Discharge: No Barriers Identified   Patient Goals and CMS Choice Patient states their goals for this hospitalization and ongoing recovery are:: return home and recover CMS Medicare.gov Compare Post Acute Care list provided to:: Patient Choice offered to / list presented to : Patient      Discharge Placement                 Home w/ [facility]      Discharge Plan and Services Additional resources added to the After Visit Summary for     Discharge Planning Services: CM Consult Post Acute Care Choice: Durable Medical Equipment, Home Health          DME Arranged: Bedside commode, Walker rolling DME Agency: [facility] Date DME Agency Contacted: 10/27/24 Time DME Agency Contacted: [phone] Representative spoke with at DME Agency: [facility] HH Arranged: PT, OT HH Agency: [facility] Date [facility] Agency Contacted: 10/27/24 Time HH Agency Contacted: 1020 Representative spoke with at [facility] Agency: [facility]  Social Drivers of Health (SDOH) Interventions SDOH Screenings   Food Insecurity: No Food Insecurity (07/24/2024)   Received from [facility]  Housing: Low Risk (07/24/2024)   Received from [facility]  Transportation Needs: No Transportation Needs (07/24/2024)   Received from [facility]  Utilities: Not At Risk (07/24/2024)   Received from [facility]  Financial Resource Strain: Low Risk (07/24/2024)   Received from [facility]  Physical Activity: Insufficiently Active (07/24/2024)   Received from [facility]  Social Connections: Moderately Integrated (07/24/2024)   Received from [facility]  Stress: No Stress Concern Present (07/24/2024)   Received from [facility]  Tobacco Use: High Risk (10/21/2024)     Readmission Risk Interventions    10/27/2024   11:12 AM  Readmission Risk Prevention Plan  Transportation Screening Complete  Home Care Screening Complete  Medication Review (RN CM) Complete

## 2024-10-28 ENCOUNTER — Other Ambulatory Visit: Payer: Self-pay | Admitting: Surgery

## 2024-10-28 DIAGNOSIS — I2510 Atherosclerotic heart disease of native coronary artery without angina pectoris: Secondary | ICD-10-CM

## 2024-11-03 ENCOUNTER — Ambulatory Visit: Payer: Self-pay

## 2024-11-03 ENCOUNTER — Ambulatory Visit (HOSPITAL_COMMUNITY)
Admission: RE | Admit: 2024-11-03 | Discharge: 2024-11-03 | Disposition: A | Discharge status: Home / Self Care | Source: Ambulatory Visit

## 2024-11-03 VITALS — BP 108/62 | HR 60 | Resp 20 | Ht 69.0 in | Wt 232.0 lb

## 2024-11-03 DIAGNOSIS — Z9861 Coronary angioplasty status: Secondary | ICD-10-CM | POA: Insufficient documentation

## 2024-11-03 DIAGNOSIS — I2510 Atherosclerotic heart disease of native coronary artery without angina pectoris: Secondary | ICD-10-CM | POA: Diagnosis present

## 2024-11-03 DIAGNOSIS — Z951 Presence of aortocoronary bypass graft: Secondary | ICD-10-CM

## 2024-11-03 MED ORDER — POTASSIUM CHLORIDE CRYS ER 20 MEQ PO TBCR: 20.0000 meq | EXTENDED_RELEASE_TABLET | ORAL | 0 refills | Status: AC | PRN

## 2024-11-03 MED ORDER — TORSEMIDE 20 MG PO TABS: 20.0000 mg | ORAL_TABLET | ORAL | 0 refills | Status: AC | PRN

## 2024-11-03 MED ORDER — MUPIROCIN 2 % EX OINT: TOPICAL_OINTMENT | Freq: Two times a day (BID) | CUTANEOUS | 0 refills | Status: AC

## 2024-11-03 NOTE — Patient Instructions (Signed)
-  Follow up on 11/10/2024 with [facility] as scheduled

## 2024-11-03 NOTE — Progress Notes (Signed)
 "     [address] 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 [name].   The patient's early postoperative recovery while in the hospital was notable for being diuresed with lasix . He developed atrial fibrillation with RVR and was chemically converted to normal sinus rhythm with amiodarone . He was started on lopressor  and losartan  was added for better blood pressure control. He was discharged with torsemide  to try and further diuresis.  He was stable for discharge home on 10/27/2024.  Since hospital discharge the patient reports that he has been doing well.  He reports that he has no pain and is not requiring any medications. He has been active with walking daily. He has been interval walking for about 5 minutes each time. His incisions have been healing appropriately.  He denies chest pain, shortness of breath and lower leg edema.  He continues to weigh himself daily and his weight has stayed stable.   Allergies as of 11/03/2024       Reactions   Simvastatin Other (See Comments)   dizziness        Medication List        Accurate as of November 03, 2024 12:35 PM. If you have any questions, ask your nurse or doctor.          allopurinol  100 MG tablet Commonly known as: ZYLOPRIM  Take 100 mg by mouth in the morning.   amiodarone  200 MG tablet Commonly known as: PACERONE  Take 1 tablet (200 mg) by mouth twice a day for 7 days, then take 1 tablet (200 mg) by mouth daily thereafter.   aspirin  EC 325 MG tablet Take 1 tablet (325 mg total) by mouth daily.   atorvastatin  40 MG tablet Commonly known as: LIPITOR  Take 1 tablet (40 mg total) by mouth in the morning.   Cholecalciferol 50 MCG (2000 UT)  Tabs Take 1,000 Units by mouth daily.   colchicine  0.6 MG tablet Take 0.6 mg by mouth daily as needed (gout flare).   cyanocobalamin  1000 MCG tablet Commonly known as: VITAMIN B12 Take 1 tablet (1,000 mcg total) by mouth daily.   finasteride  5 MG tablet Commonly known as: PROSCAR  Take 5 mg by mouth daily.   FISH OIL PO Take 2,400 mg by mouth in the morning.   fluticasone  50 MCG/ACT nasal spray Commonly known as: FLONASE  Place 2 sprays into both nostrils daily as needed (Congestion).   glipiZIDE  5 MG tablet Commonly known as: GLUCOTROL  Take 5 mg by mouth in the morning and at bedtime.   LIFESCAN FINEPOINT LANCETS Misc Use to check blood sugar 3 time(s) daily   losartan  25 MG tablet Commonly known as: COZAAR  Take 1 tablet (25 mg total) by mouth daily.   meclizine  25 MG tablet Commonly known as: ANTIVERT  Take 1 tablet (25 mg total) by mouth 3 (three) times daily as needed for dizziness.   metFORMIN  500 MG tablet Commonly known as: GLUCOPHAGE  Take 500 mg by mouth 2 (two) times daily with a meal.   metoprolol  tartrate 25 MG tablet Commonly known as: LOPRESSOR  Take 1 tablet (25 mg total) by mouth 2 (two) times  daily.   mupirocin  ointment 2 % Commonly known as: BACTROBAN  Apply topically 2 (two) times daily.   Oxycodone  HCl 10 MG Tabs Take 1 tablet (10 mg total) by mouth every 6 (six) hours as needed for severe pain (pain score 7-10).   potassium chloride  SA 20 MEQ tablet Commonly known as: KLOR-CON  M Take 1 tablet (20 mEq total) by mouth as needed. Take one tablet by mouth when taking torsemide  What changed:  when to take this reasons to take this additional instructions   tamsulosin  0.4 MG Caps capsule Commonly known as: FLOMAX  Take 0.4 mg by mouth at bedtime.   torsemide  20 MG tablet Commonly known as: DEMADEX  Take 1 tablet (20 mg total) by mouth as needed (for edema). Take one tablet by mouth for weight gain of 3 pounds in one day or 5 pounds in one  week What changed:  when to take this reasons to take this additional instructions         ROS Review of Systems  Constitutional: Negative.  Negative for fever.  Respiratory: Negative.  Negative for cough and shortness of breath.   Cardiovascular: Negative.  Negative for chest pain, palpitations and leg swelling.      BP 108/62   Pulse 60   Resp 20   Ht 5' 9 (1.753 m)   Wt 232 lb (105.2 kg)   SpO2 98% Comment: RA  BMI 34.26 kg/m    Physical Exam Constitutional:      Appearance: Normal appearance.  HENT:     Head: Normocephalic and atraumatic.  Cardiovascular:     Rate and Rhythm: Normal rate and regular rhythm.     Heart sounds: Normal heart sounds, S1 normal and S2 normal.  Pulmonary:     Effort: Pulmonary effort is normal.     Breath sounds: Normal breath sounds.  Musculoskeletal:     Right lower leg: 1+ Edema present.     Left lower leg: No edema.  Skin:    General: Skin is warm and dry.      Neurological:     General: No focal deficit present.     Mental Status: He is alert and oriented to person, place, and time.      Imaging: CLINICAL DATA:  Status post coronary artery bypass graft   EXAM: CHEST - 2 VIEW   COMPARISON:  October 25, 2024   FINDINGS: Stable cardiomediastinal silhouette. Minimal bibasilar subsegmental atelectasis is noted with probable small pleural effusions which are decreased compared to prior exam. Bony thorax is unremarkable.   IMPRESSION: Minimal bibasilar subsegmental atelectasis with probable small pleural effusions.     Electronically Signed   By: Lynwood Landy Raddle M.D.   On: 11/03/2024 11:11   Assessment/Plan:  S/P CABG x 3 -We reviewed today's chest x ray, which showed small bilateral pleural effusions. He is to continue to use his incentive spirometer.  Asymptomatic at this time.  -He has 3 more days of torsemide  and potassium left.   Resent in refill at this time for him to take torsemide  with 3 pounds of  weight gain in one day or 5 pounds of weight gain in one week.  He is to continue to weigh himself daily.  Order BMP on 11/10/2024 -He is to continue to stay active and increase as tolerated -Mupirocin  ointment prescribed to use topically on chest tube site with slight purulent drainage. Keep incision sites clean with soap and water , pat dry -Continue sternal precautions and no lifting over  10 pounds -Follow up with [facility] on 11/10/2024 with chest xray   Manuelita CHRISTELLA Rough, PA-C 12:35 PM 11/03/2024  "

## 2024-11-09 ENCOUNTER — Other Ambulatory Visit: Payer: Self-pay

## 2024-11-09 DIAGNOSIS — Z951 Presence of aortocoronary bypass graft: Secondary | ICD-10-CM

## 2024-11-10 ENCOUNTER — Ambulatory Visit

## 2024-11-10 ENCOUNTER — Ambulatory Visit
Admission: RE | Admit: 2024-11-10 | Discharge: 2024-11-10 | Disposition: A | Discharge status: Home / Self Care | Payer: Self-pay | Source: Ambulatory Visit

## 2024-11-10 ENCOUNTER — Other Ambulatory Visit

## 2024-11-10 ENCOUNTER — Ambulatory Visit: Payer: Self-pay

## 2024-11-10 VITALS — BP 136/72 | HR 79 | Resp 18 | Ht 69.0 in | Wt 230.0 lb

## 2024-11-10 DIAGNOSIS — Z951 Presence of aortocoronary bypass graft: Secondary | ICD-10-CM

## 2024-11-10 NOTE — Progress Notes (Unsigned)
 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 [name].   The patient's early postoperative recovery while in the hospital was notable for being diuresed with lasix . He developed atrial fibrillation with RVR and was chemically converted to normal sinus rhythm with amiodarone . He was started on lopressor  and losartan  was added for better blood pressure control. He was discharged with torsemide  to try and further diuresis.  He was stable for discharge home on 10/27/2024.  He presents to the clinic today with his daughter. He states that he has been doing well.  He has been active and up moving around.  He has home physical therapy but finds working with them upsets his restless leg syndrome.  This has made it harder to sleep at night.  He denies chest pain, palpitations and shortness of breath.  He does have lower leg edema on the right leg, which was used for the vein harvest site.  Left lower leg without edema.   Allergies as of 11/10/2024       Reactions   Simvastatin Other (See Comments)   dizziness        Medication List        Accurate as of November 10, 2024 11:59 PM. If you have any questions, ask your nurse or doctor.          allopurinol  100 MG tablet Commonly known as: ZYLOPRIM  Take 100 mg by mouth in the morning.   amiodarone  200 MG tablet Commonly known as: PACERONE  Take 1 tablet (200 mg) by mouth twice a day for 7 days, then take 1 tablet (200 mg) by mouth daily thereafter.   aspirin  EC 325 MG tablet Take 1 tablet (325 mg total) by mouth daily.   atorvastatin  40 MG tablet Commonly known as: LIPITOR  Take 1 tablet (40 mg total) by mouth in the morning.    Cholecalciferol 50 MCG (2000 UT) Tabs Take 1,000 Units by mouth daily.   colchicine  0.6 MG tablet Take 0.6 mg by mouth daily as needed (gout flare).   cyanocobalamin  1000 MCG tablet Commonly known as: VITAMIN B12 Take 1 tablet (1,000 mcg total) by mouth daily.   finasteride  5 MG tablet Commonly known as: PROSCAR  Take 5 mg by mouth daily.   FISH OIL PO Take 2,400 mg by mouth in the morning.   fluticasone  50 MCG/ACT nasal spray Commonly known as: FLONASE  Place 2 sprays into both nostrils daily as needed (Congestion).   glipiZIDE  5 MG tablet Commonly known as: GLUCOTROL  Take 5 mg by mouth in the morning and at bedtime.   LIFESCAN FINEPOINT LANCETS Misc Use to check blood sugar 3 time(s) daily   losartan  25 MG tablet Commonly known as: COZAAR  Take 1 tablet (25 mg total) by mouth daily.   meclizine  25 MG tablet Commonly known as: ANTIVERT  Take 1 tablet (25 mg total) by mouth 3 (three) times daily as needed for dizziness.   metFORMIN  500 MG tablet Commonly known as: GLUCOPHAGE  Take 500 mg by mouth 2 (two) times daily with a meal.   metoprolol  tartrate 25 MG tablet Commonly known as:  LOPRESSOR  Take 1 tablet (25 mg total) by mouth 2 (two) times daily.   mupirocin  ointment 2 % Commonly known as: BACTROBAN  Apply topically 2 (two) times daily.   Oxycodone  HCl 10 MG Tabs Take 1 tablet (10 mg total) by mouth every 6 (six) hours as needed for severe pain (pain score 7-10).   potassium chloride  SA 20 MEQ tablet Commonly known as: KLOR-CON  M Take 1 tablet (20 mEq total) by mouth as needed. Take one tablet by mouth when taking torsemide    tamsulosin  0.4 MG Caps capsule Commonly known as: FLOMAX  Take 0.4 mg by mouth at bedtime.   torsemide  20 MG tablet Commonly known as: DEMADEX  Take 1 tablet (20 mg total) by mouth as needed (for edema). Take one tablet by mouth for weight gain of 3 pounds in one day or 5 pounds in one week         ROS Review of Systems   Constitutional: Negative.  Negative for fever.  Respiratory: Negative.  Negative for cough and shortness of breath.   Cardiovascular:  Positive for leg swelling. Negative for chest pain and palpitations.       Right lower leg      BP 136/72   Pulse 79   Resp 18   Ht 5' 9 (1.753 m)   Wt 230 lb (104.3 kg)   SpO2 99% Comment: RA  BMI 33.97 kg/m    Physical Exam Constitutional:      Appearance: Normal appearance.  HENT:     Head: Normocephalic and atraumatic.  Cardiovascular:     Rate and Rhythm: Normal rate and regular rhythm.     Heart sounds: Normal heart sounds, S1 normal and S2 normal.  Pulmonary:     Effort: Pulmonary effort is normal.     Breath sounds: Normal breath sounds.  Musculoskeletal:     Right lower leg: 1+ Edema present.     Left lower leg: No edema.  Skin:    General: Skin is warm and dry.      Neurological:     General: No focal deficit present.     Mental Status: He is alert and oriented to person, place, and time.      Imaging: CLINICAL DATA:  Status post coronary artery bypass graft   EXAM: CHEST - 2 VIEW   COMPARISON:  October 25, 2024   FINDINGS: Stable cardiomediastinal silhouette. Minimal bibasilar subsegmental atelectasis is noted with probable small pleural effusions which are decreased compared to prior exam. Bony thorax is unremarkable.   IMPRESSION: Minimal bibasilar subsegmental atelectasis with probable small pleural effusions.     Electronically Signed   By: Lynwood Landy Raddle M.D.   On: 11/03/2024 11:11   Assessment/Plan:  S/P CABG x 3 -We reviewed today's chest x ray, which showed small bilateral pleural effusions. He is to continue to use his incentive spirometer.  Asymptomatic at this time.  -He has 3 more days of torsemide  and potassium left.   Resent in refill at this time for him to take torsemide  with 3 pounds of weight gain in one day or 5 pounds of weight gain in one week.  He is to continue to weigh himself  daily.  Order BMP on 11/10/2024 -He is to continue to stay active and increase as tolerated -Mupirocin  ointment prescribed to use topically on chest tube site with slight purulent drainage. Keep incision sites clean with soap and water , pat dry -Continue sternal precautions and no lifting over 10 pounds -Follow up  with [facility] on 11/10/2024 with chest xray   Manuelita CHRISTELLA Rough, PA-C 3:21 PM 11/13/2024  "

## 2024-11-11 LAB — BASIC METABOLIC PANEL WITH GFR
BUN/Creatinine Ratio: 21 (ref 10–24)
BUN: 21 mg/dL (ref 8–27)
CO2: 22 mmol/L (ref 20–29)
Calcium: 9.4 mg/dL (ref 8.6–10.2)
Chloride: 100 mmol/L (ref 96–106)
Creatinine, Ser: 0.99 mg/dL (ref 0.76–1.27)
Glucose: 132 mg/dL — ABNORMAL HIGH (ref 70–99)
Potassium: 5.6 mmol/L — ABNORMAL HIGH (ref 3.5–5.2)
Sodium: 138 mmol/L (ref 134–144)
eGFR: 78 mL/min/1.73

## 2024-11-12 NOTE — Progress Notes (Signed)
 " Cardiology Office Note:    Date:  11/15/2024   ID:  [name] [name]., DOB 11-29-1945, MRN [id number]  PCP:  [name] LABOR, MD   [facility] Providers Cardiologist:  [name], MD     Referring MD: [name] LABOR, MD   Chief Complaint  Patient presents with   Follow-up    CAD    History of Present Illness:    [name] [name]. is a 79 y.o. male with a hx of CAD s/p CABG x 3, HTN, DM, tobacco abuse, HLD. He has a history of PCI in early 1990. Heart cath with CTO to RCA with collaterals, significant proximal LAD and LCX disease. He was recommended for surgical revascularization.   CABG x 3 with LIMA-LAD, SVG-OM2, SVG-OM3 on 10/21/24.  Case complicated by post op Afib and converted to SR with amiodarone .   He presents for cardiology follow up. He is doing well, in a wheelchair today because he doesn't have his walker, was on a cane prior to surgery. He is walking around his apartment. He is interested in cardiac rehab at [facility].   His daughter relays that he has a history of hyperkalemia with losartan  in the past. K was recently 5.6 on 11/10/24, but also in the setting of torsemide /K supplemnent. CT surgery has ordered BMP today to check K. If still elevated, would stop losartan . I asked them to keep a BP log.    Past Medical History:  Diagnosis Date   CAD (coronary artery disease)    Cancer (HCC)    Diabetes (HCC)    Dyslipidemia    Gout    History of kidney stones    HTN (hypertension)    Myocardial infarction [facility])     Past Surgical History:  Procedure Laterality Date   ANTERIOR CERVICAL DECOMP/DISCECTOMY FUSION Left 10/20/2019   Procedure: Left Cervical Three-Four Cervical Four-Five Cervical Five-Six Anterior cervical decompression/discectomy/fusion;  Surgeon: Cheryle Debby LABOR, MD;  Location: [facility] OR;  Service: Neurosurgery;  Laterality: Left;  Left Cervical Three-Four Cervical Four-Five Cervical Five-Six Anterior cervical  decompression/discectomy/fusion   BACK SURGERY     CORONARY ARTERY BYPASS GRAFT N/A 10/21/2024   Procedure: CORONARY ARTERY BYPASS GRAFTING, TIMES 3 USING LEFT INTERNAL MAMMARY ARTERY AND ENDOSCOPICALLY HARVESTED RIGHT SAPHENOUS VEIN;  Surgeon: [name] Dorise POUR, MD;  Location: [facility] OR;  Service: Open Heart Surgery;  Laterality: N/A;   CORONARY PRESSURE/FFR WITH 3D MAPPING N/A 09/20/2024   Procedure: Coronary Pressure/FFR w/3D Mapping;  Surgeon: Mady Bruckner, MD;  Location: [facility] INVASIVE CV LAB;  Service: Cardiovascular;  Laterality: N/A;   INTRAOPERATIVE TRANSESOPHAGEAL ECHOCARDIOGRAM N/A 10/21/2024   Procedure: ECHOCARDIOGRAM, TRANSESOPHAGEAL, INTRAOPERATIVE;  Surgeon: [name] Dorise POUR, MD;  Location: [facility] OR;  Service: Open Heart Surgery;  Laterality: N/A;   LEFT HEART CATH AND CORONARY ANGIOGRAPHY N/A 09/20/2024   Procedure: LEFT HEART CATH AND CORONARY ANGIOGRAPHY;  Surgeon: Mady Bruckner, MD;  Location: [facility] INVASIVE CV LAB;  Service: Cardiovascular;  Laterality: N/A;   left knee surgery     left shoulder surgery     rotator cuff   POSTERIOR CERVICAL FUSION/FORAMINOTOMY N/A 09/17/2020   Procedure: CERVICAL THREE TO CERVICAL SIX POSTERIOR CERVICAL DECOMPRESSION;  Surgeon: Cheryle Debby LABOR, MD;  Location: [facility] OR;  Service: Neurosurgery;  Laterality: N/A;   right knee surgery      Current Medications: Active Medications[1]   Allergies:   Simvastatin   Social History   Socioeconomic History   Marital status: Widowed  Spouse name: Not on file   Number of children: Not on file   Years of education: Not on file   Highest education level: Not on file  Occupational History   Not on file  Tobacco Use   Smoking status: Former    Current packs/day: 0.00    Average packs/day: 5.0 packs/day for 36.0 years (180.0 ttl pk-yrs)    Types: Cigarettes    Start date: 11/24/1952    Quit date: 11/24/1988    Years since quitting: 36.0    Passive exposure: Never   Smokeless tobacco: Current     Types: Snuff  Vaping Use   Vaping status: Never Used  Substance and Sexual Activity   Alcohol use: No    Alcohol/week: 0.0 standard drinks of alcohol   Drug use: No   Sexual activity: Not on file  Other Topics Concern   Not on file  Social History Narrative   Not on file   Social Drivers of Health   Tobacco Use: High Risk (11/15/2024)   Patient History    Smoking Tobacco Use: Former    Smokeless Tobacco Use: Current    Passive Exposure: Never  Physicist, Medical Strain: Low Risk (07/24/2024)   Received from [facility]   Overall Financial Resource Strain (CARDIA)    How hard is it for you to pay for the very basics like food, housing, medical care, and heating?: Not hard at all  Food Insecurity: No Food Insecurity (07/24/2024)   Received from [facility]   Epic    Within the past 12 months, you worried that your food would run out before you got the money to buy more.: Never true    Within the past 12 months, the food you bought just didn't last and you didn't have money to get more.: Never true  Transportation Needs: No Transportation Needs (07/24/2024)   Received from [facility]    In the past 12 months, has lack of transportation kept you from medical appointments or from getting medications?: No    In the past 12 months, has lack of transportation kept you from meetings, work, or from getting things needed for daily living?: No  Physical Activity: Insufficiently Active (07/24/2024)   Received from [facility]   Exercise Vital Sign    On average, how many days per week do you engage in moderate to strenuous exercise (like a brisk walk)?: 2 days    On average, how many minutes do you engage in exercise at this level?: 10 min  Stress: No Stress Concern Present (07/24/2024)   Received from [facility] - Occupational Stress Questionnaire    Do you feel stress - tense, restless, nervous, or anxious, or unable to sleep at  night because your mind is troubled all the time - these days?: Only a little  Social Connections: Moderately Integrated (07/24/2024)   Received from [facility]   Social Network    How would you rate your social network (family, work, friends)?: Adequate participation with social networks  Depression (PHQ2-9): Not on file  Alcohol Screen: Not on file  Housing: Low Risk (07/24/2024)   Received from [facility]    In the last 12 months, was there a time when you were not able to pay the mortgage or rent on time?: No    In the past 12 months, how many times have you moved where you were living?: 0  At any time in the past 12 months, were you homeless or living in a shelter (including now)?: No  Utilities: Not At Risk (07/24/2024)   Received from [facility]    In the past 12 months has the electric, gas, oil, or water  company threatened to shut off services in your home?: No  Health Literacy: Not on file     Family History: The patient's =family history includes Colon cancer (age of onset: 49) in his brother; Heart attack (age of onset: 30) in his father.  ROS:   Please see the history of present illness.    = All other systems reviewed and are negative.  EKGs/Labs/Other Studies Reviewed:    The following studies were reviewed today: = EKG Interpretation Date/Time:  Monday November 15 2024 10:00:20 EST Ventricular Rate:  67 PR Interval:  178 QRS Duration:  150 QT Interval:  460 QTC Calculation: 486 R Axis:   -76  Text Interpretation: Normal sinus rhythm Right bundle branch block Left anterior fascicular block Bifascicular block When compared with ECG of 22-Oct-2024 06:49, T wave inversion now evident in Anterior leads Confirmed by Madie Slough ([id number]) on 11/15/2024 10:42:42 AM    Recent Labs: 10/19/2024: ALT 16 10/23/2024: Magnesium  2.1 10/25/2024: Hemoglobin 8.7; Platelets 202 11/10/2024: BUN 21; Creatinine, Ser 0.99; Potassium 5.6; Sodium 138  Recent  Lipid Panel    Component Value Date/Time   CHOL 106 02/11/2022 0550   TRIG 157 (H) 02/11/2022 0550   HDL 31 (L) 02/11/2022 0550   CHOLHDL 3.4 02/11/2022 0550   VLDL 31 02/11/2022 0550   LDLCALC 44 02/11/2022 0550     Risk Assessment/Calculations:                Physical Exam:    VS:  BP (!) 102/52   Pulse (!) 57     Wt Readings from Last 3 Encounters:  11/10/24 230 lb (104.3 kg)  11/03/24 232 lb (105.2 kg)  10/27/24 244 lb 4.8 oz (110.8 kg)     GEN:  Well nourished, well developed in no acute distress HEENT: Normal NECK: No JVD; No carotid bruits LYMPHATICS: No lymphadenopathy CARDIAC: RRR, no murmurs, rubs, gallops sternotomy and drain sites C/D/I RESPIRATORY:  Clear to auscultation without rales, wheezing or rhonchi  ABDOMEN: Soft, non-tender, non-distended MUSCULOSKELETAL:  RLE with legs hanging down SKIN: Warm and dry NEUROLOGIC:  Alert and oriented x 3 PSYCHIATRIC:  Normal affect   ASSESSMENT:    1. CAD S/P percutaneous coronary angioplasty   2. S/P CABG x 3   3. SOB (shortness of breath)   4. PAF (paroxysmal atrial fibrillation) (HCC)   5. Hyperlipidemia with target LDL less than 70   6. Hyperkalemia    PLAN:    In order of problems listed above:   CAD s/p CABG x 3 - remains on 325 mg ASA - prior PCI in 1990s - per CT surgery to reduce to 81 mg   Hypertension - continue 25 mg losartan , 25 mg lopressor  BID - BP well controlled if not borderline - watching potassium to see if we need to stop ARB (did not tolerate in the past with K of 6)   Post op Afib - on 200 mg amiodarone  daily, will continue this for 3-6 months then wean off - not on anticoagulation - will submit new script   Hyperlipidemia with LDL goal < 55 - checked through [facility]  - LDL was 75 07/2024 - on 40 mg lipitor   -  increase to 80 mg lipitor  - will also draw LPA today to see if he needs a PCSK9i   Hyperkalemia - K 5.6 on 11/10/24 - instructed to take a 20 mg  torsemide  - due for blood draw today - followed by CT surgery   Keep follow up with Dr. Lavona    Cardiac Rehabilitation Eligibility Assessment  The patient is ready to start cardiac rehabilitation pending clearance from the cardiac surgeon.          Medication Adjustments/Labs and Tests Ordered: Current medicines are reviewed at length with the patient today.  Concerns regarding medicines are outlined above.  Orders Placed This Encounter  Procedures   Lipoprotein A (LPA)   EKG 12-Lead   Meds ordered this encounter  Medications   amiodarone  (PACERONE ) 200 MG tablet    Sig: Take 1 tablet (200 mg total) by mouth daily.    Dispense:  90 tablet    Refill:  3    Patient Instructions  Medication Instructions:  Your physician recommends that you continue on your current medications as directed. Please refer to the Current Medication list given to you today.  *If you need a refill on your cardiac medications before your next appointment, please call your pharmacy*  Lab Work: Today- LPA, BMET If you have labs (blood work) drawn today and your tests are completely normal, you will receive your results only by: MyChart Message (if you have MyChart) OR A paper copy in the mail If you have any lab test that is abnormal or we need to change your treatment, we will call you to review the results.   Follow-Up: Keep upcoming appointments   Other Instructions Pease monitor  your blood pressure.  We need to get a better idea of what your blood pressure is running at home. Here are some instructions to follow: - I would recommend using a blood pressure cuff that goes on your arm. The wrist ones can be inaccurate. If you're purchasing one for the first time, try to select one that also reports your heart rate because this can be helpful information as well. - To check your blood pressure, choose a time at least 3 hours after taking your blood pressure medicines. If you can sample it  at different times of the day, that's great - it might give you more information about how your blood pressure fluctuates. Remain seated in a chair for 5 minutes quietly beforehand, then check it.           Signed, Jon Nat Hails, PA  11/15/2024 11:10 AM    [facility]     [1]  Current Meds  Medication Sig   allopurinol  (ZYLOPRIM ) 100 MG tablet Take 100 mg by mouth in the morning.   amiodarone  (PACERONE ) 200 MG tablet Take 1 tablet (200 mg total) by mouth daily.   aspirin  EC 325 MG tablet Take 1 tablet (325 mg total) by mouth daily.   atorvastatin  (LIPITOR ) 40 MG tablet Take 1 tablet (40 mg total) by mouth in the morning.   Cholecalciferol 50 MCG (2000 UT) TABS Take 1,000 Units by mouth daily.   colchicine  0.6 MG tablet Take 0.6 mg by mouth daily as needed (gout flare).   finasteride  (PROSCAR ) 5 MG tablet Take 5 mg by mouth daily.   fluticasone  (FLONASE ) 50 MCG/ACT nasal spray Place 2 sprays into both nostrils daily as needed (Congestion).   glipiZIDE  (GLUCOTROL ) 5 MG tablet Take 5 mg by mouth in the morning and  at bedtime.   LIFESCAN FINEPOINT LANCETS MISC Use to check blood sugar 3 time(s) daily   losartan  (COZAAR ) 25 MG tablet Take 1 tablet (25 mg total) by mouth daily.   meclizine  (ANTIVERT ) 25 MG tablet Take 1 tablet (25 mg total) by mouth 3 (three) times daily as needed for dizziness.   metFORMIN  (GLUCOPHAGE ) 500 MG tablet Take 500 mg by mouth 2 (two) times daily with a meal.   metoprolol  tartrate (LOPRESSOR ) 25 MG tablet Take 1 tablet (25 mg total) by mouth 2 (two) times daily.   mupirocin  ointment (BACTROBAN ) 2 % Apply topically 2 (two) times daily.   Omega-3 Fatty Acids (FISH OIL PO) Take 2,400 mg by mouth in the morning.   Oxycodone  HCl 10 MG TABS Take 1 tablet (10 mg total) by mouth every 6 (six) hours as needed for severe pain (pain score 7-10).   potassium chloride  SA (KLOR-CON  M) 20 MEQ tablet Take 1 tablet (20 mEq total) by mouth as needed. Take one tablet  by mouth when taking torsemide    tamsulosin  (FLOMAX ) 0.4 MG CAPS capsule Take 0.4 mg by mouth at bedtime.   torsemide  (DEMADEX ) 20 MG tablet Take 1 tablet (20 mg total) by mouth as needed (for edema). Take one tablet by mouth for weight gain of 3 pounds in one day or 5 pounds in one week   vitamin B-12 (CYANOCOBALAMIN ) 1000 MCG tablet Take 1 tablet (1,000 mcg total) by mouth daily.   [DISCONTINUED] amiodarone  (PACERONE ) 200 MG tablet Take 1 tablet (200 mg) by mouth twice a day for 7 days, then take 1 tablet (200 mg) by mouth daily thereafter.   "

## 2024-11-14 ENCOUNTER — Other Ambulatory Visit: Payer: Self-pay

## 2024-11-14 DIAGNOSIS — Z951 Presence of aortocoronary bypass graft: Secondary | ICD-10-CM

## 2024-11-14 NOTE — Patient Instructions (Signed)
-  Follow up in 3 weeks with chest xray  You may continue to gradually increase your physical activity as tolerated.  Refrain from any heavy lifting or strenuous use of your arms and shoulders until at least 6 weeks from the time of your surgery, and avoid activities that cause increased pain in your chest on the side of your surgical incision.  Otherwise you may continue to increase activities without any particular limitations.  Increase the intensity and duration of physical activity gradually.

## 2024-11-14 NOTE — Progress Notes (Signed)
-  Called Patient with BMP results -Potassium elevated at 5.6. Discussed that he should take one dose of torsemide  20 mg at this time.  He should not take potassium at this time -He denies chest pain, palpitations, tightness, dizziness, weakness, abdominal pain, and shortness of breath -He should avoid food high in potassium -Will order BMP for 11/15/2024

## 2024-11-15 ENCOUNTER — Encounter: Payer: Self-pay | Admitting: Physician Assistant

## 2024-11-15 ENCOUNTER — Ambulatory Visit: Attending: Physician Assistant | Admitting: Physician Assistant

## 2024-11-15 VITALS — BP 102/52 | HR 57

## 2024-11-15 DIAGNOSIS — E875 Hyperkalemia: Secondary | ICD-10-CM | POA: Diagnosis not present

## 2024-11-15 DIAGNOSIS — I2510 Atherosclerotic heart disease of native coronary artery without angina pectoris: Secondary | ICD-10-CM | POA: Diagnosis not present

## 2024-11-15 DIAGNOSIS — Z951 Presence of aortocoronary bypass graft: Secondary | ICD-10-CM

## 2024-11-15 DIAGNOSIS — Z9861 Coronary angioplasty status: Secondary | ICD-10-CM

## 2024-11-15 DIAGNOSIS — I480 Paroxysmal atrial fibrillation: Secondary | ICD-10-CM | POA: Diagnosis not present

## 2024-11-15 DIAGNOSIS — E785 Hyperlipidemia, unspecified: Secondary | ICD-10-CM

## 2024-11-15 DIAGNOSIS — R0602 Shortness of breath: Secondary | ICD-10-CM | POA: Diagnosis not present

## 2024-11-15 MED ORDER — AMIODARONE HCL 200 MG PO TABS: 200.0000 mg | ORAL_TABLET | Freq: Every day | ORAL | 3 refills | Status: DC

## 2024-11-15 NOTE — Patient Instructions (Signed)
 Medication Instructions:  Your physician recommends that you continue on your current medications as directed. Please refer to the Current Medication list given to you today.  *If you need a refill on your cardiac medications before your next appointment, please call your pharmacy*  Lab Work: Today- LPA, BMET If you have labs (blood work) drawn today and your tests are completely normal, you will receive your results only by: MyChart Message (if you have MyChart) OR A paper copy in the mail If you have any lab test that is abnormal or we need to change your treatment, we will call you to review the results.   Follow-Up: Keep upcoming appointments   Other Instructions Pease monitor  your blood pressure.  We need to get a better idea of what your blood pressure is running at home. Here are some instructions to follow: - I would recommend using a blood pressure cuff that goes on your arm. The wrist ones can be inaccurate. If you're purchasing one for the first time, try to select one that also reports your heart rate because this can be helpful information as well. - To check your blood pressure, choose a time at least 3 hours after taking your blood pressure medicines. If you can sample it at different times of the day, that's great - it might give you more information about how your blood pressure fluctuates. Remain seated in a chair for 5 minutes quietly beforehand, then check it.

## 2024-11-16 ENCOUNTER — Ambulatory Visit: Payer: Self-pay | Admitting: Physician Assistant

## 2024-11-16 LAB — BASIC METABOLIC PANEL WITH GFR
BUN/Creatinine Ratio: 16 (ref 10–24)
BUN: 20 mg/dL (ref 8–27)
CO2: 20 mmol/L (ref 20–29)
Calcium: 9.5 mg/dL (ref 8.6–10.2)
Chloride: 102 mmol/L (ref 96–106)
Creatinine, Ser: 1.22 mg/dL (ref 0.76–1.27)
Glucose: 120 mg/dL — ABNORMAL HIGH (ref 70–99)
Potassium: 5.4 mmol/L — ABNORMAL HIGH (ref 3.5–5.2)
Sodium: 140 mmol/L (ref 134–144)
eGFR: 61 mL/min/1.73

## 2024-11-16 LAB — LIPOPROTEIN A (LPA): Lipoprotein (a): 28 nmol/L

## 2024-11-23 ENCOUNTER — Other Ambulatory Visit: Payer: Self-pay | Admitting: Surgery

## 2024-11-23 DIAGNOSIS — Z951 Presence of aortocoronary bypass graft: Secondary | ICD-10-CM

## 2024-11-24 ENCOUNTER — Ambulatory Visit (HOSPITAL_COMMUNITY)
Admission: RE | Admit: 2024-11-24 | Discharge: 2024-11-24 | Disposition: A | Discharge status: Home / Self Care | Source: Ambulatory Visit | Attending: Cardiology | Admitting: Cardiology

## 2024-11-24 ENCOUNTER — Ambulatory Visit: Payer: Self-pay

## 2024-11-24 ENCOUNTER — Other Ambulatory Visit: Payer: Self-pay | Admitting: *Deleted

## 2024-11-24 VITALS — BP 127/75 | HR 85 | Resp 18 | Ht 69.0 in | Wt 235.0 lb

## 2024-11-24 DIAGNOSIS — Z951 Presence of aortocoronary bypass graft: Secondary | ICD-10-CM | POA: Diagnosis present

## 2024-11-24 MED ORDER — ATORVASTATIN CALCIUM 40 MG PO TABS: 40.0000 mg | ORAL_TABLET | Freq: Every morning | ORAL | 2 refills | Status: DC

## 2024-11-24 MED ORDER — AMIODARONE HCL 200 MG PO TABS: 200.0000 mg | ORAL_TABLET | Freq: Every day | ORAL | 0 refills | Status: AC

## 2024-11-24 MED ORDER — ATORVASTATIN CALCIUM 40 MG PO TABS: 40.0000 mg | ORAL_TABLET | Freq: Every morning | ORAL | 0 refills | Status: DC

## 2024-11-24 NOTE — Patient Instructions (Signed)

## 2024-11-24 NOTE — Progress Notes (Signed)
 "     [address] [place] [address]             [phone]       HPI:  Patient returns for routine postoperative follow-up having undergone Coronary artery bypass grafting x 3, Left internal mammary artery graft to the LAD, SVG to OM 2, SVG to OM3 and Endoscopic vein harvest from the right leg on 10/21/2024 with Dr. [name].   The patient's early postoperative recovery while in the hospital was notable for being diuresed with lasix . He developed atrial fibrillation with RVR and was chemically converted to normal sinus rhythm with amiodarone . He was started on lopressor  and losartan  was added for better blood pressure control. He was discharged with torsemide  to try and further diuresis.  He was stable for discharge home on 10/27/2024.  He presents to the clinic today and reports that he is doing well.  He has been active with walking.  His right sided lower leg edema has resolved without the use of torsemide . His incision sites have been healing appropriately. He has some left sided chest wall pain which is not constant. He has stopped losartan  as instructed and home blood pressures have been 90s-120s/60-70s.  He denies chest pain, shortness of breath and lower leg edema.  Allergies as of 11/24/2024       Reactions   Simvastatin Other (See Comments)   dizziness        Medication List        Accurate as of November 24, 2024  9:20 AM. If you have any questions, ask your nurse or doctor.          allopurinol  100 MG tablet Commonly known as: ZYLOPRIM  Take 100 mg by mouth in the morning.   amiodarone  200 MG tablet Commonly known as: PACERONE  Take 1 tablet (200 mg total) by mouth daily.   aspirin  EC 325 MG tablet Take 1 tablet (325 mg total) by mouth daily.   atorvastatin  40 MG tablet Commonly known as: LIPITOR  Take 1 tablet (40 mg total) by mouth in the morning.   Cholecalciferol 50 MCG (2000 UT) Tabs Take 1,000 Units by mouth daily.    colchicine  0.6 MG tablet Take 0.6 mg by mouth daily as needed (gout flare).   cyanocobalamin  1000 MCG tablet Commonly known as: VITAMIN B12 Take 1 tablet (1,000 mcg total) by mouth daily.   finasteride  5 MG tablet Commonly known as: PROSCAR  Take 5 mg by mouth daily.   FISH OIL PO Take 2,400 mg by mouth in the morning.   fluticasone  50 MCG/ACT nasal spray Commonly known as: FLONASE  Place 2 sprays into both nostrils daily as needed (Congestion).   glipiZIDE  5 MG tablet Commonly known as: GLUCOTROL  Take 5 mg by mouth in the morning and at bedtime.   LIFESCAN FINEPOINT LANCETS Misc Use to check blood sugar 3 time(s) daily   losartan  25 MG tablet Commonly known as: COZAAR  Take 1 tablet (25 mg total) by mouth daily.   meclizine  25 MG tablet Commonly known as: ANTIVERT  Take 1 tablet (25 mg total) by mouth 3 (three) times daily as needed for dizziness.   metFORMIN  500 MG tablet Commonly known as: GLUCOPHAGE  Take 500 mg by mouth 2 (two) times daily with a meal.   metoprolol  tartrate 25 MG tablet Commonly known as: LOPRESSOR  Take 1 tablet (25 mg total) by mouth 2 (two) times daily.   mupirocin  ointment 2 % Commonly known as: BACTROBAN   Apply topically 2 (two) times daily.   Oxycodone  HCl 10 MG Tabs Take 1 tablet (10 mg total) by mouth every 6 (six) hours as needed for severe pain (pain score 7-10).   potassium chloride  SA 20 MEQ tablet Commonly known as: KLOR-CON  M Take 1 tablet (20 mEq total) by mouth as needed. Take one tablet by mouth when taking torsemide    tamsulosin  0.4 MG Caps capsule Commonly known as: FLOMAX  Take 0.4 mg by mouth at bedtime.   torsemide  20 MG tablet Commonly known as: DEMADEX  Take 1 tablet (20 mg total) by mouth as needed (for edema). Take one tablet by mouth for weight gain of 3 pounds in one day or 5 pounds in one week         ROS Review of Systems  Constitutional: Negative.  Negative for fever and malaise/fatigue.  Respiratory:  Negative.  Negative for cough, shortness of breath and wheezing.   Cardiovascular:  Negative for chest pain, palpitations and leg swelling.      BP 127/75   Pulse 85   Resp 18   Ht 5' 9 (1.753 m)   Wt 235 lb (106.6 kg)   SpO2 98%   BMI 34.70 kg/m    Physical Exam Constitutional:      Appearance: Normal appearance.  HENT:     Head: Normocephalic and atraumatic.  Cardiovascular:     Rate and Rhythm: Normal rate and regular rhythm.     Heart sounds: Normal heart sounds, S1 normal and S2 normal.  Pulmonary:     Effort: Pulmonary effort is normal.     Breath sounds: Normal breath sounds.  Musculoskeletal:     Right lower leg: No edema.     Left lower leg: No edema.  Skin:    General: Skin is warm and dry.      Neurological:     General: No focal deficit present.     Mental Status: He is alert and oriented to person, place, and time.      Imaging: CLINICAL DATA:  Status post coronary artery bypass graft   EXAM: CHEST - 2 VIEW   COMPARISON:  November 10, 2024   FINDINGS: Stable cardiomediastinal silhouette. Status post coronary artery bypass graft. Minimal left basilar subsegmental atelectasis or scarring is noted. Right lung is clear. Bony thorax is unremarkable.   IMPRESSION: Minimal left basilar subsegmental atelectasis or scarring.     Electronically Signed   By: Lynwood Landy Raddle M.D.   On: 11/24/2024 09:44  Assessment/Plan:  S/P CABG x 3 -We reviewed today's chest x ray -We discussed driving and he is able to start. First time driving should be a short distance in the day time and he can increase from there -He is cleared to participate in cardiac rehab at this time -He is to continue to stay active and increase as tolerated -Discussed continuation of sternal precautions until a full 6 weeks from surgery (01/22) and no lifting over 10 pounds until a full 3 months from surgery -He should continue to hold losartan  due to hyperkalemia. Blood pressure has  stayed stable without medication in 90s-120s/60s-70s.  He has not been taking torsemide  or potassium.  BMP ordered for recheck today -He should continue blood pressure log and if readings elevate to 140s/90s then he should reach out to his PCP or cardiology -Refill sent of amiodarone  and atorvastatin .  He was low on one medication and could not remember which one.  All further refills should come from cardiology  or PCP -Follow up with [facility] as needed   Manuelita CHRISTELLA Rough, PA-C 9:20 AM 11/24/2024  "

## 2024-11-25 ENCOUNTER — Other Ambulatory Visit: Payer: Self-pay

## 2024-11-25 LAB — BASIC METABOLIC PANEL WITH GFR
BUN/Creatinine Ratio: 13 (ref 10–24)
BUN: 14 mg/dL (ref 8–27)
CO2: 21 mmol/L (ref 20–29)
Calcium: 9.2 mg/dL (ref 8.6–10.2)
Chloride: 100 mmol/L (ref 96–106)
Creatinine, Ser: 1.05 mg/dL (ref 0.76–1.27)
Glucose: 126 mg/dL — ABNORMAL HIGH (ref 70–99)
Potassium: 4.9 mmol/L (ref 3.5–5.2)
Sodium: 137 mmol/L (ref 134–144)
eGFR: 73 mL/min/1.73

## 2024-11-25 NOTE — Progress Notes (Signed)
-  Called with BMP results -Potassium now normal level -He should continue to hold losartan  and check blood pressure daily

## 2024-11-26 ENCOUNTER — Encounter: Payer: Self-pay | Admitting: Cardiology

## 2024-11-26 ENCOUNTER — Telehealth: Payer: Self-pay | Admitting: *Deleted

## 2024-11-26 ENCOUNTER — Encounter (HOSPITAL_COMMUNITY)
Admission: RE | Admit: 2024-11-26 | Discharge: 2024-11-26 | Disposition: A | Discharge status: Home / Self Care | Source: Ambulatory Visit | Attending: Cardiology | Admitting: Cardiology

## 2024-11-26 DIAGNOSIS — Z951 Presence of aortocoronary bypass graft: Secondary | ICD-10-CM | POA: Insufficient documentation

## 2024-11-26 NOTE — Telephone Encounter (Signed)
 Patient's daughter, Joni, called the office with patient c/o low BP. States BP today is 76/47. States patient is feeling dizzy. States he has stop Losartan  as instructed and has not taken torsemide . Advised that patient drink plenty of water  today and to eat something salty to help bring BP up. Advised that patient go from sitting to standing slowly. Advised to call 911 if BP continues to drop. Advised to contact Cardiologist for medication adjustments. Daughter verbalized understanding.

## 2024-11-26 NOTE — Progress Notes (Signed)
 Virtual orientation visit completed for cardiac rehab with CABGx3. On-site orientation visit scheduled for 12/06/24 at 10:30.

## 2024-12-01 NOTE — Progress Notes (Unsigned)
" °  Cardiology Office Note:   Date:  12/02/2024  ID:  [name] [name]., DOB 01-Sep-1946, MRN [id number] PCP: [name] LABOR, MD  [facility] Providers Cardiologist:  [name], MD {  History of Present Illness:   [name] [name]. is a 79 y.o. male with a hx of CAD s/p CABG x 3, HTN, DM, tobacco abuse, HLD. He has a history of PCI in early 1990. Heart cath with CTO to RCA with collaterals, significant proximal LAD and LCX disease. He was recommended for surgical revascularization.   He had a CABG x 3 with LIMA-LAD, SVG-OM2, SVG-OM3 on 10/21/24.   His case complicated by post op Afib and converted to SR with amiodarone .   Since surgery he has made a nice recovery.  He is living in a retirement apartment since he lost his wife suddenly last year.  He is going to start cardiac rehab very soon.  He is not having any of the shortness of breath that was his previous anginal equivalent.  He denies any chest pressure, neck or arm discomfort.  He has had no weight gain.  He had some mild lower extremity edema.  ROS: As stated in the HPI and negative for all other systems.  Studies Reviewed:    EKG:     NA  Risk Assessment/Calculations:       Physical Exam:   VS:  BP 120/78   Pulse 97   Ht 5' 10 (1.778 m)   Wt 232 lb 12.8 oz (105.6 kg)   SpO2 97%   BMI 33.40 kg/m    Wt Readings from Last 3 Encounters:  12/02/24 232 lb 12.8 oz (105.6 kg)  11/24/24 235 lb (106.6 kg)  11/10/24 230 lb (104.3 kg)     GEN: Well nourished, well developed in no acute distress NECK: No JVD; No carotid bruits CARDIAC: RRR, no murmurs, rubs, gallops RESPIRATORY:  Clear to auscultation without rales, wheezing or rhonchi  ABDOMEN: Soft, non-tender, non-distended EXTREMITIES: Trace leg edema; No deformity   ASSESSMENT AND PLAN:   CAD s/p CABG x 3: The patient has done well and no change in therapy other than as below.  Hypertension: His blood pressures have been running low.  His losartan  was  discontinued.  I am going to stop his metoprolol .   Post op Afib: I will see him back in a couple of months in [place] and likely stop his amiodarone .  Hyperlipidemia with LDL goal < 55: His prescription for Lipitor  most recent 20 mg that should be 40.  I will change this and check a lipid profile after the next appointment.  Hyperkalemia: He had follow-up labs and his potassium was okay.    Follow up with me in 2 months in [place].   Signed, [name], MD   "

## 2024-12-02 ENCOUNTER — Ambulatory Visit: Attending: Cardiology | Admitting: Cardiology

## 2024-12-02 ENCOUNTER — Encounter: Payer: Self-pay | Admitting: Cardiology

## 2024-12-02 VITALS — BP 120/78 | HR 97 | Ht 70.0 in | Wt 232.8 lb

## 2024-12-02 DIAGNOSIS — Z951 Presence of aortocoronary bypass graft: Secondary | ICD-10-CM | POA: Diagnosis not present

## 2024-12-02 DIAGNOSIS — I10 Essential (primary) hypertension: Secondary | ICD-10-CM | POA: Diagnosis not present

## 2024-12-02 DIAGNOSIS — E785 Hyperlipidemia, unspecified: Secondary | ICD-10-CM | POA: Diagnosis not present

## 2024-12-02 MED ORDER — ATORVASTATIN CALCIUM 40 MG PO TABS: 40.0000 mg | ORAL_TABLET | Freq: Every morning | ORAL | 3 refills | Status: AC

## 2024-12-02 NOTE — Patient Instructions (Signed)
 Medication Instructions:  Stop Metoprolol  Tartrate Increase Atorvastatin  to 40 mg daily *If you need a refill on your cardiac medications before your next appointment, please call your pharmacy*  Lab Work: NONE If you have labs (blood work) drawn today and your tests are completely normal, you will receive your results only by: MyChart Message (if you have MyChart) OR A paper copy in the mail If you have any lab test that is abnormal or we need to change your treatment, we will call you to review the results.  Testing/Procedures: NONE  Follow-Up: At [facility], you and your health needs are our priority.  As part of our continuing mission to provide you with exceptional heart care, our providers are all part of one team.  This team includes your primary Cardiologist (physician) and Advanced Practice Providers or APPs (Physician Assistants and Nurse Practitioners) who all work together to provide you with the care you need, when you need it.  Your next appointment:   2 month(s) in [place]  Provider:   [name], MD    We recommend signing up for the patient portal called MyChart.  Sign up information is provided on this After Visit Summary.  MyChart is used to connect with patients for Virtual Visits (Telemedicine).  Patients are able to view lab/test results, encounter notes, upcoming appointments, etc.  Non-urgent messages can be sent to your provider as well.   To learn more about what you can do with MyChart, go to [email or web address].

## 2024-12-06 ENCOUNTER — Encounter (HOSPITAL_COMMUNITY)

## 2024-12-13 ENCOUNTER — Encounter (HOSPITAL_COMMUNITY)

## 2024-12-15 ENCOUNTER — Encounter (HOSPITAL_COMMUNITY): Admission: RE | Admit: 2024-12-15 | Discharge: 2024-12-15 | Attending: Cardiology

## 2024-12-15 VITALS — Ht 67.0 in | Wt 233.7 lb

## 2024-12-15 DIAGNOSIS — Z951 Presence of aortocoronary bypass graft: Secondary | ICD-10-CM

## 2024-12-15 NOTE — Progress Notes (Signed)
 Cardiac Individual Treatment Plan  Patient Details  Name: [name] [name]. MRN: [id number] Date of Birth: 17-Jul-1946 Referring Provider:   Flowsheet Row CARDIAC REHAB PHASE II ORIENTATION from 12/15/2024 in [facility] CARDIAC REHABILITATION  Referring Provider Lavona Agent MD    Initial Encounter Date:  Flowsheet Row CARDIAC REHAB PHASE II ORIENTATION from 12/15/2024 in [place] [facility]ARD[facility]  Date 12/15/24    Visit Diagnosis: S/P CABG x 3  Patient's Home Medications on Admission: Current Medications[1]  Past Medical History: Past Medical History:  Diagnosis Date   CAD (coronary artery disease)    Cancer (HCC)    Diabetes (HCC)    Dyslipidemia    Gout    History of kidney stones    HTN (hypertension)    Myocardial infarction (HCC)     Tobacco Use: Tobacco Use History[2]  Labs: Review Flowsheet  More data exists      Latest Ref Rng & Units 09/15/2020 09/16/2020 02/11/2022 10/19/2024 10/21/2024  Labs for ITP Cardiac and Pulmonary Rehab  Cholestrol 0 - 200 mg/dL - 873  893  - -  LDL (calc) 0 - 99 mg/dL - 38  44  - -  HDL-C >59 mg/dL - 32  31  - -  Trlycerides <150 mg/dL - 718  842  - -  Hemoglobin A1c 4.8 - 5.6 % 6.9  - 7.0  8.5  -  PH, Arterial 7.35 - 7.45 - - - - 7.346  7.352  7.346  7.378  7.297  7.298   PCO2 arterial 32 - 48 mmHg - - - - 42.6  40.4  40.5  38.8  49.3  48.3   Bicarbonate 20.0 - 28.0 mmol/L - - - - 23.2  22.5  22.2  22.8  24.5  24.1  23.7   TCO2 22 - 32 mmol/L - - - - 24  24  23  23  24  26  26  26  26  24  24  25    Acid-base deficit 0.0 - 2.0 mmol/L - - - - 2.0  3.0  3.0  2.0  2.0  2.0  3.0   O2 Saturation % - - - - 98  99  99  100  83  100  99     Details       Multiple values from one day are sorted in reverse-chronological order          Exercise Target Goals: Exercise Program Goal: Individual exercise prescription set using results from initial 6 min walk test and THRR while considering  patients activity barriers and  safety.   Exercise Prescription Goal: Initial exercise prescription builds to 30-45 minutes a day of aerobic activity, 2-3 days per week.  Home exercise guidelines will be given to patient during program as part of exercise prescription that the participant will acknowledge.   Education: Aerobic Exercise: - Group verbal and visual presentation on the components of exercise prescription. Introduces F.I.T.T principle from ACSM for exercise prescriptions.  Reviews F.I.T.T. principles of aerobic exercise including progression. Written material provided at class time.   Education: Resistance Exercise: - Group verbal and visual presentation on the components of exercise prescription. Introduces F.I.T.T principle from ACSM for exercise prescriptions  Reviews F.I.T.T. principles of resistance exercise including progression. Written material provided at class time.    Education: Exercise & Equipment Safety: - Individual verbal instruction and demonstration of equipment use and safety with use of the equipment.   Education: Exercise Physiology &  General Exercise Guidelines: - Group verbal and written instruction with models to review the exercise physiology of the cardiovascular system and associated critical values. Provides general exercise guidelines with specific guidelines to those with heart or lung disease. Written material provided at class time. Flowsheet Row CARDIAC REHAB PHASE II ORIENTATION from 12/15/2024 in [place] [facility]  Education need identified 12/15/24    Education: Flexibility, Balance, Mind/Body Relaxation: - Group verbal and visual presentation with interactive activity on the components of exercise prescription. Introduces F.I.T.T principle from ACSM for exercise prescriptions. Reviews F.I.T.T. principles of flexibility and balance exercise training including progression. Also discusses the mind body connection.  Reviews various relaxation techniques to help  reduce and manage stress (i.e. Deep breathing, progressive muscle relaxation, and visualization). Balance handout provided to take home. Written material provided at class time.   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 Walks with cane.   Cardiac Risk Stratification High          6 Minute Walk:  6 Minute Walk     Row Name 12/15/24 1535         6 Minute Walk   Phase Initial     Distance 700 feet     Walk Time 3.3 minutes     # of Rest Breaks 1     MPH 1.82     METS 1.03     RPE 14     Perceived Dyspnea  1     VO2 Peak 3.6     Symptoms Yes (comment)     Comments BLE fatigue     Resting HR 92 bpm     Resting BP 100/60     Resting Oxygen Saturation  96 %     Exercise Oxygen Saturation  during 6 min walk 96 %     Max Ex. HR 110 bpm     Max Ex. BP 130/60     2 Minute Post BP 122/60        Oxygen Initial Assessment:   Oxygen Re-Evaluation:   Oxygen Discharge (Final Oxygen Re-Evaluation):   Initial Exercise Prescription:  Initial Exercise Prescription - 12/15/24 1500       Date of Initial Exercise RX and Referring Provider   Date 12/15/24    Referring Provider Lavona Agent MD      NuStep   Level 1    SPM 60    Minutes 15    METs 1.8      Arm Ergometer   Level 1    RPM 50    Minutes 15    METs 1.8      Prescription Details   Frequency (times per week) 2    Duration Progress to 30 minutes of continuous aerobic without signs/symptoms of physical distress      Intensity   THRR 40-80% of Max Heartrate 112-133    Ratings of Perceived Exertion 11-13    Perceived Dyspnea 0-4      Resistance Training   Training Prescription Yes     Weight 3    Reps 10-15          Perform Capillary Blood Glucose checks as needed.  Exercise Prescription Changes:   Exercise Prescription Changes     Row Name  12/15/24 1500             Response to Exercise   Blood Pressure (Admit) 100/60       Blood Pressure (Exercise) 130/60       Blood Pressure (Exit) 122/60       Heart Rate (Admit) 92 bpm       Heart Rate (Exercise) 110 bpm       Heart Rate (Exit) 94 bpm       Oxygen Saturation (Admit) 96 %       Oxygen Saturation (Exercise) 96 %       Oxygen Saturation (Exit) 96 %       Rating of Perceived Exertion (Exercise) 14       Perceived Dyspnea (Exercise) 1       Symptoms BLE fatigue          Exercise Comments:   Exercise Goals and Review:   Exercise Goals     Row Name 12/15/24 1538             Exercise Goals   Increase Physical Activity Yes       Intervention Provide advice, education, support and counseling about physical activity/exercise needs.;Develop an individualized exercise prescription for aerobic and resistive training based on initial evaluation findings, risk stratification, comorbidities and participant's personal goals.       Expected Outcomes Short Term: Attend rehab on a regular basis to increase amount of physical activity.;Long Term: Add in home exercise to make exercise part of routine and to increase amount of physical activity.;Long Term: Exercising regularly at least 3-5 days a week.       Increase Strength and Stamina Yes       Intervention Provide advice, education, support and counseling about physical activity/exercise needs.;Develop an individualized exercise prescription for aerobic and resistive training based on initial evaluation findings, risk stratification, comorbidities and participant's personal goals.       Expected Outcomes Short Term: Increase workloads from initial exercise prescription for resistance, speed, and METs.;Short Term: Perform resistance training exercises routinely  during rehab and add in resistance training at home;Long Term: Improve cardiorespiratory fitness, muscular endurance and strength as measured by increased METs and functional capacity ( )       Able to understand and use rate of perceived exertion (RPE) scale Yes       Intervention Provide education and explanation on how to use RPE scale       Expected Outcomes Short Term: Able to use RPE daily in rehab to express subjective intensity level;Long Term:  Able to use RPE to guide intensity level when exercising independently       Able to understand and use Dyspnea scale Yes       Intervention Provide education and explanation on how to use Dyspnea scale       Expected Outcomes Short Term: Able to use Dyspnea scale daily in rehab to express subjective sense of shortness of breath during exertion;Long Term: Able to use Dyspnea scale to guide intensity level when exercising independently       Knowledge and understanding of Target Heart Rate Range (THRR) Yes       Intervention Provide education and explanation of THRR including how the numbers were predicted and where they are located for reference       Expected Outcomes Short Term: Able to state/look up THRR;Short Term: Able to use daily as guideline for intensity in rehab;Long Term: Able to use THRR to govern intensity when exercising independently  Able to check pulse independently Yes       Intervention Provide education and demonstration on how to check pulse in carotid and radial arteries.;Review the importance of being able to check your own pulse for safety during independent exercise       Expected Outcomes Short Term: Able to explain why pulse checking is important during independent exercise;Long Term: Able to check pulse independently and accurately       Understanding of Exercise Prescription Yes       Intervention Provide education, explanation, and written materials on patient's individual exercise prescription       Expected  Outcomes Short Term: Able to explain program exercise prescription;Long Term: Able to explain home exercise prescription to exercise independently          Exercise Goals Re-Evaluation :   Discharge Exercise Prescription (Final Exercise Prescription Changes):  Exercise Prescription Changes - 12/15/24 1500       Response to Exercise   Blood Pressure (Admit) 100/60    Blood Pressure (Exercise) 130/60    Blood Pressure (Exit) 122/60    Heart Rate (Admit) 92 bpm    Heart Rate (Exercise) 110 bpm    Heart Rate (Exit) 94 bpm    Oxygen Saturation (Admit) 96 %    Oxygen Saturation (Exercise) 96 %    Oxygen Saturation (Exit) 96 %    Rating of Perceived Exertion (Exercise) 14    Perceived Dyspnea (Exercise) 1    Symptoms BLE fatigue          Nutrition:  Target Goals: Understanding of nutrition guidelines, daily intake of sodium 1500mg , cholesterol 200mg , calories 30% from fat and 7% or less from saturated fats, daily to have 5 or more servings of fruits and vegetables.  Education: Nutrition 1 -Group instruction provided by verbal, written material, interactive activities, discussions, models, and posters to present general guidelines for heart healthy nutrition including macronutrients, label reading, and promoting whole foods over processed counterparts. Education serves as pensions consultant of discussion of heart healthy eating for all. Written material provided at class time. Flowsheet Row CARDIAC REHAB PHASE II ORIENTATION from 12/15/2024 in [place] [facility]  Education need identified 12/15/24     Education: Nutrition 2 -Group instruction provided by verbal, written material, interactive activities, discussions, models, and posters to present general guidelines for heart healthy nutrition including sodium, cholesterol, and saturated fat. Providing guidance of habit forming to improve blood pressure, cholesterol, and body weight. Written material provided at class  time. Flowsheet Row CARDIAC REHAB PHASE II ORIENTATION from 12/15/2024 in [place] [facility]  Education need identified 12/15/24      Biometrics:  Pre Biometrics - 12/15/24 1539       Pre Biometrics   Height 5' 7 (1.702 m)    Weight 106 kg    Waist Circumference 44 inches    Hip Circumference 47 inches    Waist to Hip Ratio 0.94 %    BMI (Calculated) 36.59    Grip Strength 12.7 kg           Nutrition Therapy Plan and Nutrition Goals:   Nutrition Assessments:  MEDIFICTS Score Key: >=70 Need to make dietary changes  40-70 Heart Healthy Diet <= 40 Therapeutic Level Cholesterol Diet  Flowsheet Row CARDIAC REHAB PHASE II ORIENTATION from 12/15/2024 in [facility] CARDIAC REH[facility]  Picture Your Plate Total Score on Admission 64   Picture Your Plate Scores: <59 Unhealthy dietary pattern with much room for improvement. 41-50 Dietary pattern unlikely  to meet recommendations for good health and room for improvement. 51-60 More healthful dietary pattern, with some room for improvement.  >60 Healthy dietary pattern, although there may be some specific behaviors that could be improved.    Nutrition Goals Re-Evaluation:   Nutrition Goals Discharge (Final Nutrition Goals Re-Evaluation):   Psychosocial: Target Goals: Acknowledge presence or absence of significant depression and/or stress, maximize coping skills, provide positive support system. Participant is able to verbalize types and ability to use techniques and skills needed for reducing stress and depression.   Education: Stress, Anxiety, and Depression - Group verbal and visual presentation to define topics covered.  Reviews how body is impacted by stress, anxiety, and depression.  Also discusses healthy ways to reduce stress and to treat/manage anxiety and depression. Written material provided at class time.   Education: Sleep Hygiene -Provides group verbal and written instruction about how sleep  can affect your health.  Define sleep hygiene, discuss sleep cycles and impact of sleep habits. Review good sleep hygiene tips.   Initial Review & Psychosocial Screening:  Initial Psych Review & Screening - 11/26/24 1511       Initial Review   Current issues with Current Depression;Current Sleep Concerns      Family Dynamics   Good Support System? Yes    Concerns Recent loss of significant other   Patient's wife died with an MI 2025-04-12. They were married for 51 years.   Comments Patient's 2 daughters and son support him.      Barriers   Psychosocial barriers to participate in program The patient should benefit from training in stress management and relaxation.;Psychosocial barriers identified (see note)      Screening Interventions   Interventions To provide support and resources with identified psychosocial needs;Provide feedback about the scores to participant;Encouraged to exercise    Expected Outcomes Short Term goal: Utilizing psychosocial counselor, staff and physician to assist with identification of specific Stressors or current issues interfering with healing process. Setting desired goal for each stressor or current issue identified.;Long Term Goal: Stressors or current issues are controlled or eliminated.;Short Term goal: Identification and review with participant of any Quality of Life or Depression concerns found by scoring the questionnaire.;Long Term goal: The participant improves quality of Life and PHQ9 Scores as seen by post scores and/or verbalization of changes          Quality of Life Scores:   Quality of Life - 12/15/24 1540       Quality of Life   Select Quality of Life      Quality of Life Scores   Health/Function Pre 22.82 %    Socioeconomic Pre 28 %    Psych/Spiritual Pre 27.21 %    Family Pre 19.75 %    GLOBAL Pre 24.42 %         Scores of 19 and below usually indicate a poorer quality of life in these areas.  A difference of  2-3 points is a  clinically meaningful difference.  A difference of 2-3 points in the total score of the Quality of Life Index has been associated with significant improvement in overall quality of life, self-image, physical symptoms, and general health in studies assessing change in quality of life.  PHQ-9: Review Flowsheet       12/15/2024  Depression screen PHQ 2/9  Decreased Interest 1  Down, Depressed, Hopeless 2  PHQ - 2 Score 3  Altered sleeping 1  Tired, decreased energy 1  Change in appetite 1  Feeling bad or failure about yourself  0  Trouble concentrating 0  Moving slowly or fidgety/restless 0  Suicidal thoughts 0  PHQ-9 Score 6  Difficult doing work/chores Not difficult at all   Interpretation of Total Score  Total Score Depression Severity:  1-4 = Minimal depression, 5-9 = Mild depression, 10-14 = Moderate depression, 15-19 = Moderately severe depression, 20-27 = Severe depression   Psychosocial Evaluation and Intervention:  Psychosocial Evaluation - 11/26/24 1513       Psychosocial Evaluation & Interventions   Interventions Stress management education;Relaxation education;Encouraged to exercise with the program and follow exercise prescription    Comments Patient was referred to cardiac rehab with CABGx3 10/21/24. He had a PCI in 1990. He says he is depressed due to grief over his wife's sudden death in Apr 15, 2025 with an MI. He said he had to move to an apartment his grief has been so difficult for him. He is not being treated currently. He says he sleeps okay but has difficulty at times. 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 He currently uses smokeless nicotine dipping pouches. He does not express any interest in quitting. 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 He uses a cane. His goals for the program are to get stronger; get healthier overall and to be more independent. He has no barriers identified to complete the program.     Expected Outcomes Short Term: Patient will start the program and attend consistently. Long Term: Patient will complete the program meeting personal goals.    Continue Psychosocial Services  Follow up required by staff          Psychosocial Re-Evaluation:   Psychosocial Discharge (Final Psychosocial Re-Evaluation):   Vocational Rehabilitation: Provide vocational rehab assistance to qualifying candidates.   Vocational Rehab Evaluation & Intervention:  Vocational Rehab - 11/26/24 1510       Initial Vocational Rehab Evaluation & Intervention   Assessment shows need for Vocational Rehabilitation No      Vocational Rehab Re-Evaulation   Comments Disabled. Former naval architect.          Education: Education Goals: Education classes will be provided on a variety of topics geared toward better understanding of heart health and risk factor modification. Participant will state understanding/return demonstration of topics presented as noted by education test scores.  Learning Barriers/Preferences:  Learning Barriers/Preferences - 11/26/24 1510       Learning Barriers/Preferences   Learning Barriers None    Learning Preferences Skilled Demonstration          General Cardiac Education Topics:  AED/CPR: - Group verbal and written instruction with the use of models to demonstrate the basic use of the AED with the basic ABC's of resuscitation.   Test and Procedures: - Group verbal and visual presentation and models provide information about basic cardiac anatomy and function. Reviews the testing methods done to diagnose heart disease and the outcomes of the test results. Describes the treatment choices: Medical Management, Angioplasty, or Coronary Bypass Surgery for treating various heart conditions including Myocardial Infarction, Angina, Valve Disease, and Cardiac Arrhythmias. Written material provided at class time.   Medication Safety: - Group verbal and visual instruction  to review commonly prescribed medications for heart and lung disease. Reviews the medication, class of the drug, and side effects. Includes the steps to properly store meds and maintain the prescription regimen. Written material provided at class time.   Intimacy: - Group verbal  instruction through game format to discuss how heart and lung disease can affect sexual intimacy. Written material provided at class time. Flowsheet Row CARDIAC REHAB PHASE II ORIENTATION from 12/15/2024 in [place] [facility]  Education need identified 12/15/24    Know Your Numbers and Heart Failure: - Group verbal and visual instruction to discuss disease risk factors for cardiac and pulmonary disease and treatment options.  Reviews associated critical values for Overweight/Obesity, Hypertension, Cholesterol, and Diabetes.  Discusses basics of heart failure: signs/symptoms and treatments.  Introduces Heart Failure Zone chart for action plan for heart failure. Written material provided at class time. Flowsheet Row CARDIAC REHAB PHASE II ORIENTATION from 12/15/2024 in [place] [facility]  Education need identified 12/15/24    Infection Prevention: - Provides verbal and written material to individual with discussion of infection control including proper hand washing and proper equipment cleaning during exercise session.   Falls Prevention: - Provides verbal and written material to individual with discussion of falls prevention and safety.   Other: -Provides group and verbal instruction on various topics (see comments)   Knowledge Questionnaire Score:  Knowledge Questionnaire Score - 12/15/24 1504       Knowledge Questionnaire Score   Pre Score 16/26          Core Components/Risk Factors/Patient Goals at Admission:  Personal Goals and Risk Factors at Admission - 11/26/24 1510       Core Components/Risk Factors/Patient Goals on Admission    Weight Management Weight Maintenance     Improve shortness of breath with ADL's Yes    Intervention Provide education, individualized exercise plan and daily activity instruction to help decrease symptoms of SOB with activities of daily living.    Expected Outcomes Short Term: Improve cardiorespiratory fitness to achieve a reduction of symptoms when performing ADLs;Long Term: Be able to perform more ADLs without symptoms or delay the onset of symptoms    Diabetes Yes    Intervention Provide education about signs/symptoms and action to take for hypo/hyperglycemia.;Provide education about proper nutrition, including hydration, and aerobic/resistive exercise prescription along with prescribed medications to achieve blood glucose in normal ranges: Fasting glucose 65-99 mg/dL    Expected Outcomes Short Term: Participant verbalizes understanding of the signs/symptoms and immediate care of hyper/hypoglycemia, proper foot care and importance of medication, aerobic/resistive exercise and nutrition plan for blood glucose control.;Long Term: Attainment of HbA1C < 7%.    Hypertension Yes    Intervention Provide education on lifestyle modifcations including regular physical activity/exercise, weight management, moderate sodium restriction and increased consumption of fresh fruit, vegetables, and low fat dairy, alcohol moderation, and smoking cessation.;Monitor prescription use compliance.    Expected Outcomes Short Term: Continued assessment and intervention until BP is < 140/38mm HG in hypertensive participants. < 130/45mm HG in hypertensive participants with diabetes, heart failure or chronic kidney disease.;Long Term: Maintenance of blood pressure at goal levels.    Lipids Yes    Intervention Provide education and support for participant on nutrition & aerobic/resistive exercise along with prescribed medications to achieve LDL 70mg , HDL >40mg .    Expected Outcomes Short Term: Participant states understanding of desired cholesterol values and is  compliant with medications prescribed. Participant is following exercise prescription and nutrition guidelines.;Long Term: Cholesterol controlled with medications as prescribed, with individualized exercise RX and with personalized nutrition plan. Value goals: LDL < 70mg , HDL > 40 mg.          Education:Diabetes - Individual verbal and written instruction to review signs/symptoms of diabetes, desired  ranges of glucose level fasting, after meals and with exercise. Acknowledge that pre and post exercise glucose checks will be done for 3 sessions at entry of program.   Core Components/Risk Factors/Patient Goals Review:    Core Components/Risk Factors/Patient Goals at Discharge (Final Review):    ITP Comments:  ITP Comments     Row Name 11/26/24 1520 12/15/24 1509         ITP Comments Virtual orientation visit completed for cardiac rehab with CABGx3. On-site orientation visit scheduled for 12/06/24 at 10:30. Patient arrived for 1st visit/orientation/education at 1430. Patient was referred to CR by Dr. [name] due to status post CABGx3. During orientation advised patient on arrival and appointment times what to wear, what to do before, during and after exercise. Reviewed attendance and class policy.  Pt is scheduled to return Cardiac Rehab on 12/20/24 at 1100. Pt was advised to come to class 15 minutes before class starts.  Discussed RPE/Dpysnea scales. Patient participated in warm up stretches. Patient was able to complete 6 minute walk test.  Telemetry:NSR. Patient was measured for the equipment. Discussed equipment safety with patient. Took patient pre-anthropometric measurements. Patient finished visit at 1515.         Comments: Patient arrived for 1st visit/orientation/education at 1430. Patient was referred to CR by Dr. [name] due to status post CABGx3. During orientation advised patient on arrival and appointment times what to wear, what to do before, during and after  exercise. Reviewed attendance and class policy.  Pt is scheduled to return Cardiac Rehab on 12/20/24 at 1100. Pt was advised to come to class 15 minutes before class starts.  Discussed RPE/Dpysnea scales. Patient participated in warm up stretches. Patient was able to complete 6 minute walk test.  Telemetry:NSR. Patient was measured for the equipment. Discussed equipment safety with patient. Took patient pre-anthropometric measurements. Patient finished visit at 1515.      [1]  Current Outpatient Medications:    allopurinol  (ZYLOPRIM ) 100 MG tablet, Take 100 mg by mouth in the morning., Disp: , Rfl:    amiodarone  (PACERONE ) 200 MG tablet, Take 1 tablet (200 mg total) by mouth daily., Disp: 90 tablet, Rfl: 0   aspirin  EC 325 MG tablet, Take 1 tablet (325 mg total) by mouth daily., Disp: , Rfl:    atorvastatin  (LIPITOR ) 40 MG tablet, Take 1 tablet (40 mg total) by mouth in the morning., Disp: 90 tablet, Rfl: 3   Cholecalciferol 50 MCG (2000 UT) TABS, Take 1,000 Units by mouth daily., Disp: , Rfl:    colchicine  0.6 MG tablet, Take 0.6 mg by mouth daily as needed (gout flare)., Disp: , Rfl:    finasteride  (PROSCAR ) 5 MG tablet, Take 5 mg by mouth daily., Disp: , Rfl:    fluticasone  (FLONASE ) 50 MCG/ACT nasal spray, Place 2 sprays into both nostrils daily as needed (Congestion)., Disp: , Rfl:    glipiZIDE  (GLUCOTROL ) 5 MG tablet, Take 5 mg by mouth in the morning and at bedtime., Disp: , Rfl:    LIFESCAN FINEPOINT LANCETS MISC, Use to check blood sugar 3 time(s) daily, Disp: , Rfl:    losartan  (COZAAR ) 25 MG tablet, Take 1 tablet (25 mg total) by mouth daily., Disp: 30 tablet, Rfl: 1   meclizine  (ANTIVERT ) 25 MG tablet, Take 1 tablet (25 mg total) by mouth 3 (three) times daily as needed for dizziness., Disp: 45 tablet, Rfl: 1   metFORMIN  (GLUCOPHAGE ) 500 MG tablet, Take 500 mg by mouth 2 (two) times daily with  a meal., Disp: , Rfl:    mupirocin  ointment (BACTROBAN ) 2 %, Apply topically 2 (two) times  daily., Disp: 22 g, Rfl: 0   Omega-3 Fatty Acids (FISH OIL PO), Take 2,400 mg by mouth in the morning., Disp: , Rfl:    Oxycodone  HCl 10 MG TABS, Take 1 tablet (10 mg total) by mouth every 6 (six) hours as needed for severe pain (pain score 7-10)., Disp: 30 tablet, Rfl: 0   potassium chloride  SA (KLOR-CON  M) 20 MEQ tablet, Take 1 tablet (20 mEq total) by mouth as needed. Take one tablet by mouth when taking torsemide , Disp: 14 tablet, Rfl: 0   tamsulosin  (FLOMAX ) 0.4 MG CAPS capsule, Take 0.4 mg by mouth at bedtime. (Patient not taking: Reported on 12/02/2024), Disp: , Rfl:    torsemide  (DEMADEX ) 20 MG tablet, Take 1 tablet (20 mg total) by mouth as needed (for edema). Take one tablet by mouth for weight gain of 3 pounds in one day or 5 pounds in one week, Disp: 14 tablet, Rfl: 0   vitamin B-12 (CYANOCOBALAMIN ) 1000 MCG tablet, Take 1 tablet (1,000 mcg total) by mouth daily., Disp: , Rfl:  [2]  Social History Tobacco Use  Smoking Status Former   Current packs/day: 0.00   Average packs/day: 5.0 packs/day for 36.0 years (180.0 ttl pk-yrs)   Types: Cigarettes   Start date: 11/24/1952   Quit date: 11/24/1988   Years since quitting: 36.0   Passive exposure: Never  Smokeless Tobacco Current   Types: Snuff

## 2024-12-15 NOTE — Patient Instructions (Signed)
 Patient Instructions  Patient Details  Name: [name] [name]. MRN: [id number] Date of Birth: 06-10-1946 Referring Provider:  Lavona Agent, MD  Below are your personal goals for exercise, nutrition, and risk factors. Our goal is to help you stay on track towards obtaining and maintaining these goals. We will be discussing your progress on these goals with you throughout the program.  Initial Exercise Prescription:  Initial Exercise Prescription - 12/15/24 1500       Date of Initial Exercise RX and Referring Provider   Date 12/15/24    Referring Provider Lavona Agent MD      NuStep   Level 1    SPM 60    Minutes 15    METs 1.8      Arm Ergometer   Level 1    RPM 50    Minutes 15    METs 1.8      Prescription Details   Frequency (times per week) 2    Duration Progress to 30 minutes of continuous aerobic without signs/symptoms of physical distress      Intensity   THRR 40-80% of Max Heartrate 112-133    Ratings of Perceived Exertion 11-13    Perceived Dyspnea 0-4      Resistance Training   Training Prescription Yes    Weight 3    Reps 10-15          Exercise Goals: Frequency: Be able to perform aerobic exercise two to three times per week in program working toward 2-5 days per week of home exercise.  Intensity: Work with a perceived exertion of 11 (fairly light) - 15 (hard) while following your exercise prescription.  We will make changes to your prescription with you as you progress through the program.   Duration: Be able to do 30 to 45 minutes of continuous aerobic exercise in addition to a 5 minute warm-up and a 5 minute cool-down routine.   Nutrition Goals: Your personal nutrition goals will be established when you do your nutrition analysis with the dietician.  The following are general nutrition guidelines to follow: Cholesterol < 200mg /day Sodium < 1500mg /day Fiber: Men over 50 yrs - 30 grams per day  Personal Goals:  Personal Goals and Risk  Factors at Admission - 11/26/24 1510       Core Components/Risk Factors/Patient Goals on Admission    Weight Management Weight Maintenance    Improve shortness of breath with ADL's Yes    Intervention Provide education, individualized exercise plan and daily activity instruction to help decrease symptoms of SOB with activities of daily living.    Expected Outcomes Short Term: Improve cardiorespiratory fitness to achieve a reduction of symptoms when performing ADLs;Long Term: Be able to perform more ADLs without symptoms or delay the onset of symptoms    Diabetes Yes    Intervention Provide education about signs/symptoms and action to take for hypo/hyperglycemia.;Provide education about proper nutrition, including hydration, and aerobic/resistive exercise prescription along with prescribed medications to achieve blood glucose in normal ranges: Fasting glucose 65-99 mg/dL    Expected Outcomes Short Term: Participant verbalizes understanding of the signs/symptoms and immediate care of hyper/hypoglycemia, proper foot care and importance of medication, aerobic/resistive exercise and nutrition plan for blood glucose control.;Long Term: Attainment of HbA1C < 7%.    Hypertension Yes    Intervention Provide education on lifestyle modifcations including regular physical activity/exercise, weight management, moderate sodium restriction and increased consumption of fresh fruit, vegetables, and low fat dairy, alcohol moderation, and smoking cessation.;Monitor  prescription use compliance.    Expected Outcomes Short Term: Continued assessment and intervention until BP is < 140/55mm HG in hypertensive participants. < 130/105mm HG in hypertensive participants with diabetes, heart failure or chronic kidney disease.;Long Term: Maintenance of blood pressure at goal levels.    Lipids Yes    Intervention Provide education and support for participant on nutrition & aerobic/resistive exercise along with prescribed medications  to achieve LDL 70mg , HDL >40mg .    Expected Outcomes Short Term: Participant states understanding of desired cholesterol values and is compliant with medications prescribed. Participant is following exercise prescription and nutrition guidelines.;Long Term: Cholesterol controlled with medications as prescribed, with individualized exercise RX and with personalized nutrition plan. Value goals: LDL < 70mg , HDL > 40 mg.          Tobacco Use Initial Evaluation: Social History   Tobacco Use  Smoking Status Former   Current packs/day: 0.00   Average packs/day: 5.0 packs/day for 36.0 years (180.0 ttl pk-yrs)   Types: Cigarettes   Start date: 11/24/1952   Quit date: 11/24/1988   Years since quitting: 36.0   Passive exposure: Never  Smokeless Tobacco Current   Types: Snuff    Exercise Goals and Review:  Exercise Goals     Row Name 12/15/24 1538             Exercise Goals   Increase Physical Activity Yes       Intervention Provide advice, education, support and counseling about physical activity/exercise needs.;Develop an individualized exercise prescription for aerobic and resistive training based on initial evaluation findings, risk stratification, comorbidities and participant's personal goals.       Expected Outcomes Short Term: Attend rehab on a regular basis to increase amount of physical activity.;Long Term: Add in home exercise to make exercise part of routine and to increase amount of physical activity.;Long Term: Exercising regularly at least 3-5 days a week.       Increase Strength and Stamina Yes       Intervention Provide advice, education, support and counseling about physical activity/exercise needs.;Develop an individualized exercise prescription for aerobic and resistive training based on initial evaluation findings, risk stratification, comorbidities and participant's personal goals.       Expected Outcomes Short Term: Increase workloads from initial exercise prescription  for resistance, speed, and METs.;Short Term: Perform resistance training exercises routinely during rehab and add in resistance training at home;Long Term: Improve cardiorespiratory fitness, muscular endurance and strength as measured by increased METs and functional capacity ( )       Able to understand and use rate of perceived exertion (RPE) scale Yes       Intervention Provide education and explanation on how to use RPE scale       Expected Outcomes Short Term: Able to use RPE daily in rehab to express subjective intensity level;Long Term:  Able to use RPE to guide intensity level when exercising independently       Able to understand and use Dyspnea scale Yes       Intervention Provide education and explanation on how to use Dyspnea scale       Expected Outcomes Short Term: Able to use Dyspnea scale daily in rehab to express subjective sense of shortness of breath during exertion;Long Term: Able to use Dyspnea scale to guide intensity level when exercising independently       Knowledge and understanding of Target Heart Rate Range (THRR) Yes       Intervention Provide education and explanation of  THRR including how the numbers were predicted and where they are located for reference       Expected Outcomes Short Term: Able to state/look up THRR;Short Term: Able to use daily as guideline for intensity in rehab;Long Term: Able to use THRR to govern intensity when exercising independently       Able to check pulse independently Yes       Intervention Provide education and demonstration on how to check pulse in carotid and radial arteries.;Review the importance of being able to check your own pulse for safety during independent exercise       Expected Outcomes Short Term: Able to explain why pulse checking is important during independent exercise;Long Term: Able to check pulse independently and accurately       Understanding of Exercise Prescription Yes       Intervention Provide education,  explanation, and written materials on patient's individual exercise prescription       Expected Outcomes Short Term: Able to explain program exercise prescription;Long Term: Able to explain home exercise prescription to exercise independently          Copy of goals given to participant.

## 2024-12-20 ENCOUNTER — Encounter (HOSPITAL_COMMUNITY)

## 2024-12-22 ENCOUNTER — Encounter (HOSPITAL_COMMUNITY)

## 2024-12-27 ENCOUNTER — Encounter (HOSPITAL_COMMUNITY)

## 2024-12-29 ENCOUNTER — Encounter (HOSPITAL_COMMUNITY)

## 2025-01-03 ENCOUNTER — Encounter (HOSPITAL_COMMUNITY)

## 2025-01-05 ENCOUNTER — Encounter (HOSPITAL_COMMUNITY)

## 2025-01-10 ENCOUNTER — Encounter (HOSPITAL_COMMUNITY)

## 2025-01-12 ENCOUNTER — Encounter (HOSPITAL_COMMUNITY)

## 2025-01-17 ENCOUNTER — Encounter (HOSPITAL_COMMUNITY)

## 2025-01-19 ENCOUNTER — Encounter (HOSPITAL_COMMUNITY)

## 2025-01-24 ENCOUNTER — Encounter (HOSPITAL_COMMUNITY)

## 2025-01-26 ENCOUNTER — Encounter (HOSPITAL_COMMUNITY)

## 2025-01-26 ENCOUNTER — Ambulatory Visit: Admitting: Cardiology

## 2025-01-31 ENCOUNTER — Encounter (HOSPITAL_COMMUNITY)

## 2025-02-02 ENCOUNTER — Encounter (HOSPITAL_COMMUNITY)

## 2025-02-07 ENCOUNTER — Encounter (HOSPITAL_COMMUNITY)

## 2025-02-09 ENCOUNTER — Encounter (HOSPITAL_COMMUNITY)

## 2025-02-14 ENCOUNTER — Encounter (HOSPITAL_COMMUNITY)

## 2025-02-16 ENCOUNTER — Ambulatory Visit: Admitting: Cardiology

## 2025-02-16 ENCOUNTER — Encounter (HOSPITAL_COMMUNITY)

## 2025-02-21 ENCOUNTER — Encounter (HOSPITAL_COMMUNITY)

## 2025-02-23 ENCOUNTER — Encounter (HOSPITAL_COMMUNITY)

## 2025-02-28 ENCOUNTER — Encounter (HOSPITAL_COMMUNITY)

## 2025-03-02 ENCOUNTER — Encounter (HOSPITAL_COMMUNITY)

## 2025-03-07 ENCOUNTER — Encounter (HOSPITAL_COMMUNITY)

## 2025-03-09 ENCOUNTER — Encounter (HOSPITAL_COMMUNITY)

## 2025-03-14 ENCOUNTER — Encounter (HOSPITAL_COMMUNITY)

## 2025-03-16 ENCOUNTER — Encounter (HOSPITAL_COMMUNITY)

## 2025-03-21 ENCOUNTER — Encounter (HOSPITAL_COMMUNITY)

## 2025-03-23 ENCOUNTER — Encounter (HOSPITAL_COMMUNITY)

## 2025-03-28 ENCOUNTER — Encounter (HOSPITAL_COMMUNITY)

## 2025-03-30 ENCOUNTER — Encounter (HOSPITAL_COMMUNITY)

## 2025-04-04 ENCOUNTER — Encounter (HOSPITAL_COMMUNITY)

## 2025-04-06 ENCOUNTER — Encounter (HOSPITAL_COMMUNITY)

## 2025-04-11 ENCOUNTER — Encounter (HOSPITAL_COMMUNITY)

## 2025-04-13 ENCOUNTER — Encounter (HOSPITAL_COMMUNITY)

## 2025-04-18 ENCOUNTER — Encounter (HOSPITAL_COMMUNITY)
